# Patient Record
Sex: Female | Born: 1935 | Race: White | Hispanic: No | Marital: Married | Smoking: Never smoker
Health system: Southern US, Community
[De-identification: ages and names within clinical notes are randomized; demographics above are authoritative.]

## PROBLEM LIST (undated history)

## (undated) DIAGNOSIS — D649 Anemia, unspecified: Secondary | ICD-10-CM

## (undated) DIAGNOSIS — M810 Age-related osteoporosis without current pathological fracture: Secondary | ICD-10-CM

## (undated) DIAGNOSIS — I739 Peripheral vascular disease, unspecified: Principal | ICD-10-CM

## (undated) DIAGNOSIS — Z9289 Personal history of other medical treatment: Secondary | ICD-10-CM

## (undated) DIAGNOSIS — M1990 Unspecified osteoarthritis, unspecified site: Secondary | ICD-10-CM

## (undated) DIAGNOSIS — L97909 Non-pressure chronic ulcer of unspecified part of unspecified lower leg with unspecified severity: Secondary | ICD-10-CM

## (undated) DIAGNOSIS — S32049A Unspecified fracture of fourth lumbar vertebra, initial encounter for closed fracture: Secondary | ICD-10-CM

## (undated) DIAGNOSIS — E039 Hypothyroidism, unspecified: Secondary | ICD-10-CM

## (undated) DIAGNOSIS — I82409 Acute embolism and thrombosis of unspecified deep veins of unspecified lower extremity: Secondary | ICD-10-CM

## (undated) DIAGNOSIS — I50814 Right heart failure due to left heart failure: Secondary | ICD-10-CM

## (undated) DIAGNOSIS — I4891 Unspecified atrial fibrillation: Secondary | ICD-10-CM

## (undated) DIAGNOSIS — I10 Essential (primary) hypertension: Secondary | ICD-10-CM

## (undated) HISTORY — PX: OTHER SURGICAL HISTORY: SHX169

## (undated) HISTORY — PX: TONSILLECTOMY AND ADENOIDECTOMY: SUR1326

## (undated) HISTORY — DX: Non-pressure chronic ulcer of unspecified part of unspecified lower leg with unspecified severity: L97.909

## (undated) HISTORY — DX: Peripheral vascular disease, unspecified: I73.9

## (undated) HISTORY — DX: Age-related osteoporosis without current pathological fracture: M81.0

## (undated) HISTORY — DX: Unspecified atrial fibrillation: I48.91

## (undated) HISTORY — DX: Right heart failure due to left heart failure: I50.814

## (undated) HISTORY — DX: Essential (primary) hypertension: I10

---

## 1958-12-27 HISTORY — PX: DILATION AND CURETTAGE OF UTERUS: SHX78

## 2011-04-28 DIAGNOSIS — I4891 Unspecified atrial fibrillation: Secondary | ICD-10-CM

## 2011-04-28 DIAGNOSIS — Z9289 Personal history of other medical treatment: Secondary | ICD-10-CM

## 2011-04-28 DIAGNOSIS — I739 Peripheral vascular disease, unspecified: Secondary | ICD-10-CM

## 2011-04-28 DIAGNOSIS — I82409 Acute embolism and thrombosis of unspecified deep veins of unspecified lower extremity: Secondary | ICD-10-CM

## 2011-04-28 HISTORY — DX: Peripheral vascular disease, unspecified: I73.9

## 2011-04-28 HISTORY — DX: Acute embolism and thrombosis of unspecified deep veins of unspecified lower extremity: I82.409

## 2011-04-28 HISTORY — DX: Personal history of other medical treatment: Z92.89

## 2011-04-28 HISTORY — DX: Unspecified atrial fibrillation: I48.91

## 2011-04-28 HISTORY — PX: THROMBECTOMY: PRO61

## 2012-06-15 ENCOUNTER — Telehealth: Payer: Self-pay | Admitting: *Deleted

## 2012-06-15 ENCOUNTER — Ambulatory Visit: Payer: Self-pay | Admitting: Internal Medicine

## 2012-06-15 NOTE — Telephone Encounter (Signed)
Home health nurse called stating the pt has been admitted to home health care and she has a pressure ulcer on her left post interior heel. She believes pt needs wound care appt. She wanted Dr Debby Bud to be aware of this. Be advised.

## 2012-06-15 NOTE — Telephone Encounter (Signed)
Kristina Diaz has not established with me for care - will be happy to help once she is established as my patient

## 2012-06-21 ENCOUNTER — Ambulatory Visit (INDEPENDENT_AMBULATORY_CARE_PROVIDER_SITE_OTHER): Payer: Medicare Other | Admitting: Internal Medicine

## 2012-06-21 ENCOUNTER — Encounter: Payer: Self-pay | Admitting: Internal Medicine

## 2012-06-21 VITALS — BP 104/64 | HR 62 | Temp 97.1°F | Resp 10

## 2012-06-21 DIAGNOSIS — E079 Disorder of thyroid, unspecified: Secondary | ICD-10-CM

## 2012-06-21 DIAGNOSIS — I739 Peripheral vascular disease, unspecified: Secondary | ICD-10-CM

## 2012-06-21 DIAGNOSIS — I4891 Unspecified atrial fibrillation: Secondary | ICD-10-CM

## 2012-06-21 DIAGNOSIS — I10 Essential (primary) hypertension: Secondary | ICD-10-CM

## 2012-06-21 NOTE — Progress Notes (Signed)
Subjective:    Patient ID: Kristina Diaz, female    DOB: 1929/08/11, 79 y.o.   MRN: [id number]  HPI Kristina Diaz presents to establish for on-going medical care. She had been in good health until the last year. She developed marked LE edema. She has had stasis wounds that required treatment. 1 year ago she had the onset of swelling in the legs and shortness of breath. She was seen by cardiology and was diagnosed with atrial fibrillation. Plus or minus started on medication, was started on blood thinner.  She was also referred to a vascular surgeon for unclear reasons but most likely for PAD. She had arterial doppler which revealed poor circulation left LE. She was admitted for treatment of a "clot"  Blockage with thrombectomy. She went to rehab. She was readmitted for recurrent arterial blockage in the left leg which was successfully treated, PCI(?) but she developed a large wound left calve requiring plastic surgery consult but no surgical intervention. She was put to a wound vac and returned to rehab. She then developed decubitus ulcer left achilles/heel that required wound vac. She then developed a hip strain that required rehab again. She was discharged to home Feb 10th. She subsequently has moved to [place].  No outside records were available at today's visit for review leaving many questions about her diagnosis and treatment unanswered.  Past Medical History  Diagnosis Date  . Clotting disorder   . Heart disease   . Blood transfusion without reported diagnosis 2013    Had DVT and did require transfusion  . PAD (peripheral artery disease) 2013    left leg. sounds like she had angio-plasty   . Atrial fibrillation 2013    s/p [facility] that failed. ON medication  . Hypertension   . Thyroid disease 2013    hypothyroidism  . Osteoporosis    Past Surgical History  Procedure Laterality Date  . Tonsillectomy and adenoidectomy    . Thrombectomy  2013  . G2p2     Family History  Problem Relation  Age of Onset  . Arthritis Mother   . Cancer Mother     breast cancer   . Cancer Brother     lung cancer  . Tuberculosis Father   . Cancer Sister     stg 4 breast cancer  . Diabetes Neg Hx   . Heart disease Neg Hx   . COPD Neg Hx    History   Social History  . Marital Status: Married    Spouse Name: N/A    Number of Children: 2  . Years of Education: 13   Occupational History  . retired    Social History Main Topics  . Smoking status: Never Smoker   . Smokeless tobacco: Never Used  . Alcohol Use: 1.5 oz/week    3 drink(s) per week     Comment: wine  . Drug Use: No  . Sexually Active: Yes -- Female partner(s)   Other Topics Concern  . Not on file   Social History Narrative   HSG. 1 year of college. Married: 1959: 2 sons - '63, '70: 3 grandchildren. Work - Animal nutritionist. Have been living with husband in [place]. ACP/Living will - HCPOA both sons. Recommended going to [email or web address].                       Review of Systems Constitutional:  Negative for fever, chills, activity change and unexpected weight change. But she has had weight gain  from fluid accumulation. HEENT:  Negative for hearing loss, ear pain, congestion, neck stiffness and postnasal drip. Negative for sore throat or swallowing problems. Negative for dental complaints.   Eyes: Negative for vision loss or change in visual acuity.  Respiratory: Negative for chest tightness and wheezing. Negative for DOE.   Cardiovascular: Negative for chest pain or palpitations. No decreased exercise tolerance Gastrointestinal: No change in bowel habit. No bloating or gas. No reflux or indigestion Genitourinary: Negative for urgency, frequency, flank pain and difficulty urinating.  Musculoskeletal: Negative for myalgias, back pain, arthralgias and gait problem.  Neurological: Negative for dizziness, tremors, weakness and headaches.  Hematological: Negative for adenopathy.   Psychiatric/Behavioral: Negative for behavioral problems. POsitive for dysphoric mood.       Objective:   Physical Exam Filed Vitals:   06/21/12 1123  BP: 104/64  Pulse: 62  Temp: 97.1 F (36.2 C)  Resp: 10   Gen'l - obese white woman carry most of her weight in her legs and lower torso. She is in no medical distress HEENT - [place]/AT, C&S clear Pulm - normal respiration. Neuro - No focal finding on observation but she is in w/c and was not stood. Very poor recollection of medical events and poorly organized in her thinking Derm - LE with chronic stasis change: erythematous, very firm and waxy to touch. Healed wound left calve               Assessment & Plan:  60 minutes spent in obtaining history and limited exam. Patient will return in 1 week for more complete exam and at that time will have hopefully received hospital and medical records from [place].

## 2012-06-22 DIAGNOSIS — I10 Essential (primary) hypertension: Secondary | ICD-10-CM | POA: Insufficient documentation

## 2012-06-22 DIAGNOSIS — I4891 Unspecified atrial fibrillation: Secondary | ICD-10-CM | POA: Insufficient documentation

## 2012-06-22 DIAGNOSIS — E079 Disorder of thyroid, unspecified: Secondary | ICD-10-CM | POA: Insufficient documentation

## 2012-06-22 DIAGNOSIS — I739 Peripheral vascular disease, unspecified: Secondary | ICD-10-CM | POA: Insufficient documentation

## 2012-06-22 NOTE — Assessment & Plan Note (Signed)
Records requested. No acute vascular problems at today's visit

## 2012-06-22 NOTE — Assessment & Plan Note (Signed)
Hemodynamically stable. Meds reviewed.  Plan - continue present meds  Will obtain and review outside labs

## 2012-06-22 NOTE — Assessment & Plan Note (Signed)
On multiple medications. By her report she has no prior h/o HTN. BP today is OK  Plan Continue present medications

## 2012-06-22 NOTE — Assessment & Plan Note (Signed)
On replacement therapy.  Plan Continue present medications  Will need follow up lab.

## 2012-06-23 ENCOUNTER — Emergency Department (HOSPITAL_COMMUNITY)
Admission: EM | Admit: 2012-06-23 | Discharge: 2012-06-23 | Disposition: A | Discharge status: Home / Self Care | Payer: Medicare Other | Attending: Emergency Medicine | Admitting: Emergency Medicine

## 2012-06-23 ENCOUNTER — Encounter (HOSPITAL_COMMUNITY): Payer: Self-pay | Admitting: Emergency Medicine

## 2012-06-23 ENCOUNTER — Emergency Department (HOSPITAL_COMMUNITY): Payer: Medicare Other

## 2012-06-23 DIAGNOSIS — L02619 Cutaneous abscess of unspecified foot: Secondary | ICD-10-CM | POA: Insufficient documentation

## 2012-06-23 DIAGNOSIS — E039 Hypothyroidism, unspecified: Secondary | ICD-10-CM | POA: Insufficient documentation

## 2012-06-23 DIAGNOSIS — I8310 Varicose veins of unspecified lower extremity with inflammation: Secondary | ICD-10-CM | POA: Insufficient documentation

## 2012-06-23 DIAGNOSIS — Z79899 Other long term (current) drug therapy: Secondary | ICD-10-CM | POA: Insufficient documentation

## 2012-06-23 DIAGNOSIS — I10 Essential (primary) hypertension: Secondary | ICD-10-CM | POA: Insufficient documentation

## 2012-06-23 DIAGNOSIS — I4891 Unspecified atrial fibrillation: Secondary | ICD-10-CM | POA: Insufficient documentation

## 2012-06-23 DIAGNOSIS — Z8739 Personal history of other diseases of the musculoskeletal system and connective tissue: Secondary | ICD-10-CM | POA: Insufficient documentation

## 2012-06-23 DIAGNOSIS — Z8679 Personal history of other diseases of the circulatory system: Secondary | ICD-10-CM | POA: Insufficient documentation

## 2012-06-23 DIAGNOSIS — Z7901 Long term (current) use of anticoagulants: Secondary | ICD-10-CM | POA: Insufficient documentation

## 2012-06-23 DIAGNOSIS — Z86718 Personal history of other venous thrombosis and embolism: Secondary | ICD-10-CM | POA: Insufficient documentation

## 2012-06-23 HISTORY — DX: Acute embolism and thrombosis of unspecified deep veins of unspecified lower extremity: I82.409

## 2012-06-23 LAB — CBC WITH DIFFERENTIAL/PLATELET
HCT: 30.7 % — ABNORMAL LOW (ref 36.0–46.0)
Hemoglobin: 10.2 g/dL — ABNORMAL LOW (ref 12.0–15.0)
Lymphocytes Relative: 21 % (ref 12–46)
Lymphs Abs: 1.1 10*3/uL (ref 0.7–4.0)
Monocytes Absolute: 0.8 10*3/uL (ref 0.1–1.0)
Monocytes Relative: 15 % — ABNORMAL HIGH (ref 3–12)
Neutro Abs: 3.3 10*3/uL (ref 1.7–7.7)
WBC: 5.3 10*3/uL (ref 4.0–10.5)

## 2012-06-23 LAB — URINALYSIS, ROUTINE W REFLEX MICROSCOPIC
Bilirubin Urine: NEGATIVE
Ketones, ur: NEGATIVE mg/dL
Nitrite: NEGATIVE
pH: 6.5 (ref 5.0–8.0)

## 2012-06-23 LAB — COMPREHENSIVE METABOLIC PANEL
AST: 32 U/L (ref 0–37)
BUN: 40 mg/dL — ABNORMAL HIGH (ref 6–23)
CO2: 31 mEq/L (ref 19–32)
Chloride: 98 mEq/L (ref 96–112)
Creatinine, Ser: 1.09 mg/dL (ref 0.50–1.10)
GFR calc non Af Amer: 47 mL/min — ABNORMAL LOW (ref >90)
Glucose, Bld: 117 mg/dL — ABNORMAL HIGH (ref 70–99)
Total Bilirubin: 1 mg/dL (ref 0.3–1.2)

## 2012-06-23 LAB — URINE MICROSCOPIC-ADD ON

## 2012-06-23 LAB — PROTIME-INR
INR: 2.28 — ABNORMAL HIGH (ref 0.00–1.49)
Prothrombin Time: 24.1 seconds — ABNORMAL HIGH (ref 11.6–15.2)

## 2012-06-23 MED ORDER — CLINDAMYCIN HCL 300 MG PO CAPS
300.0000 mg | ORAL_CAPSULE | Freq: Once | ORAL | Status: AC
  Administered 2012-06-23: 300 mg via ORAL
  Filled 2012-06-23: qty 1

## 2012-06-23 MED ORDER — CLINDAMYCIN HCL 150 MG PO CAPS: 150.0000 mg | ORAL_CAPSULE | Freq: Four times a day (QID) | ORAL | Status: DC

## 2012-06-23 NOTE — ED Notes (Signed)
Just moved here from [place] and she has had redness and swelling on both lower legs has open wound left heal

## 2012-06-23 NOTE — ED Notes (Signed)
Pt had DVT removed last March.  Swelling in legs has continued since that time.  Pt with hx of compression fracture along spine and a torn ligament in right hip.  Pt from [facility].  Pt presents with redness/swelling to legs with blisters present.

## 2012-06-23 NOTE — ED Provider Notes (Addendum)
History     CSN: [id number]  Arrival date & time 06/23/12  1252   First MD Initiated Contact with Patient 06/23/12 1315      Chief Complaint  Patient presents with  . Leg Swelling    (Consider location/radiation/quality/duration/timing/severity/associated sxs/prior treatment) HPI Patient presents with daughters after recent move [place] state.  Patient has  Chronic venous stasis of bilateral lower extremities, history of dvt on anticoagulation.  Daughter does not know baseline of swelling but thinks they may be more red than previously.  There is a dressing on a wound of the left heel. Patient does not complain of increased pain, swelling, fever, trauma, dyspnea.   Past Medical History  Diagnosis Date  . Clotting disorder   . Heart disease   . Blood transfusion without reported diagnosis 2013    Had DVT and did require transfusion  . PAD (peripheral artery disease) 2013    left leg. sounds like she had angio-plasty   . Atrial fibrillation 2013    s/p [facility] that failed. ON medication  . Hypertension   . Thyroid disease 2013    hypothyroidism  . Osteoporosis   . DVT (deep venous thrombosis)     Past Surgical History  Procedure Laterality Date  . Tonsillectomy and adenoidectomy    . Thrombectomy  2013  . G2p2      Family History  Problem Relation Age of Onset  . Arthritis Mother   . Cancer Mother     breast cancer   . Cancer Brother     lung cancer  . Tuberculosis Father   . Cancer Sister     stg 4 breast cancer  . Diabetes Neg Hx   . Heart disease Neg Hx   . COPD Neg Hx     History  Substance Use Topics  . Smoking status: Never Smoker   . Smokeless tobacco: Never Used  . Alcohol Use: 1.5 oz/week    3 drink(s) per week     Comment: wine    OB History   Grav Para Term Preterm Abortions TAB SAB Ect Mult Living                  Review of Systems  All other systems reviewed and are negative.    Allergies  Review of patient's allergies  indicates no known allergies.  Home Medications   Current Outpatient Rx  Name  Route  Sig  Dispense  Refill  . acetaminophen (TYLENOL) 325 MG tablet   Oral   Take 650 mg by mouth daily as needed for pain.          Marland Kitchen amiodarone (PACERONE) 200 MG tablet   Oral   Take 200 mg by mouth every morning.          . clotrimazole (LOTRIMIN) 1 % cream   Topical   Apply 1 application topically 2 (two) times daily. Apply for 14 days.  Order date 06/10/2012.         . digoxin (LANOXIN) 0.125 MG tablet   Oral   Take 0.125 mg by mouth every morning.          . docusate sodium (COLACE) 100 MG capsule   Oral   Take 100 mg by mouth 2 (two) times daily.          . furosemide (LASIX) 40 MG tablet   Oral   Take 40 mg by mouth every morning.          Marland Kitchen  levothyroxine (SYNTHROID, LEVOTHROID) 25 MCG tablet   Oral   Take 25 mcg by mouth daily.         . metoprolol succinate (TOPROL-XL) 25 MG 24 hr tablet   Oral   Take 25 mg by mouth daily. Hold for SBP <110 and HR <60 and call MD.         . Multiple Vitamin (MULTIVITAMIN WITH MINERALS) TABS   Oral   Take 1 tablet by mouth every morning.         . nutrition supplement (JUVEN) PACK   Oral   Take 1 packet by mouth 3 (three) times daily.         Marland Kitchen oxyCODONE (OXY IR/ROXICODONE) 5 MG immediate release tablet   Oral   Take 5 mg by mouth 4 (four) times daily as needed for pain.         Marland Kitchen oxyCODONE (OXYCONTIN) 10 MG 12 hr tablet   Oral   Take 10 mg by mouth 2 (two) times daily.          . potassium chloride (K-DUR,KLOR-CON) 10 MEQ tablet   Oral   Take 10 mEq by mouth every morning.          . senna (SENOKOT) 8.6 MG TABS   Oral   Take 2 tablets by mouth every morning.         . warfarin (COUMADIN) 3 MG tablet   Oral   Take 3 mg by mouth every evening.           BP 128/55  Pulse 74  Temp(Src) 98 F (36.7 C) (Oral)  Resp 18  SpO2 96%  Physical Exam  Nursing note and vitals reviewed. Constitutional:  She appears well-developed and well-nourished.  HENT:  Head: Normocephalic and atraumatic.  Eyes: Conjunctivae and EOM are normal. Pupils are equal, round, and reactive to light.  Neck: Normal range of motion. Neck supple.  Cardiovascular: Normal rate.   Pulmonary/Chest: Effort normal.  Abdominal: Soft.  Musculoskeletal:  Bilateral lower extremity swelling edema with some erythema.  Left heal with pressure ulcer.    Neurological: She is alert.  Skin: Skin is warm and dry.    ED Course  Procedures (including critical care time)  Labs Reviewed  CBC WITH DIFFERENTIAL  COMPREHENSIVE METABOLIC PANEL  PROTIME-INR  URINALYSIS, ROUTINE W REFLEX MICROSCOPIC   Dg Chest Port 1 View  06/23/2012  *RADIOLOGY REPORT*  Clinical Data: CHF.  Swelling.  Hypertension.  PORTABLE CHEST - 1 VIEW  Comparison: None.  Findings: Midline trachea.  Mild cardiomegaly.  No right and no definite left pleural fluid.  No pneumothorax.  Right lung is clear.  Suboptimal evaluation of the inferior left hemithorax due to overlying soft tissues.  IMPRESSION: Cardiomegaly without congestive failure.  Suboptimal evaluation of the left pleural space and lung base due to overlying soft tissues.  Left-sided pleural fluid and/or left base air space disease cannot be excluded.   Original Report Authenticated By: Jeronimo Greaves, M.D.      No diagnosis found.   Date: 06/23/2012  Rate: 64  Rhythm: atrial fibrillation  QRS Axis: left  Intervals: a fib  ST/T Wave abnormalities: prwp, nsst   Conduction Disutrbances:none  Narrative Interpretation:   Old EKG Reviewed: none available     Patient with chronic venous stasis with superimposed cellulitis. She is not febrile and does not have an elevated white blood cell count. She'll be started on clindamycin. She and family are advised to have recheck tomorrow. They  are advised regarding signs and symptoms to return to emergency pertinent for such as fever, increasing redness,  inability to keep medications down.       Hilario Quarry, MD 06/23/12 1702  Hilario Quarry, MD 07/16/12 [phone]

## 2012-06-25 LAB — URINE CULTURE

## 2012-06-27 ENCOUNTER — Telehealth: Payer: Self-pay | Admitting: *Deleted

## 2012-06-27 NOTE — Telephone Encounter (Signed)
Kristina Diaz with [facility] needs an order for wound care for her Left Heel area, needs serious debriding.  Order for imodium as well, clindamycin causing diarrhea.   Also, Crystal, Wellness Nurse with [facility] called needing order for PT, OT and nursing.  They are wanting to discontinue with [facility] and use a new agency.  Pt is refusing to take senokot and requesting colace, to take 1 time a day instead of 2 times daily.  Imodium order as well.

## 2012-06-27 NOTE — Telephone Encounter (Signed)
Patient has follow up appointment Weds March 5th at [facility] - will assess wound then and if needing debridement will need to set her up with the wound center.  Need to have an agency identified if new orders for PT/OT/RN are needed/

## 2012-06-27 NOTE — Telephone Encounter (Signed)
Please read note below

## 2012-06-29 ENCOUNTER — Ambulatory Visit: Payer: Medicare Other | Admitting: Internal Medicine

## 2012-06-29 ENCOUNTER — Encounter: Payer: Self-pay | Admitting: Internal Medicine

## 2012-06-29 ENCOUNTER — Ambulatory Visit (INDEPENDENT_AMBULATORY_CARE_PROVIDER_SITE_OTHER): Payer: Medicare Other | Admitting: Internal Medicine

## 2012-06-29 VITALS — BP 128/72 | HR 55 | Temp 97.5°F | Resp 12 | Wt 195.0 lb

## 2012-06-29 DIAGNOSIS — I739 Peripheral vascular disease, unspecified: Secondary | ICD-10-CM

## 2012-06-29 DIAGNOSIS — L97409 Non-pressure chronic ulcer of unspecified heel and midfoot with unspecified severity: Secondary | ICD-10-CM

## 2012-06-29 DIAGNOSIS — L98499 Non-pressure chronic ulcer of skin of other sites with unspecified severity: Secondary | ICD-10-CM

## 2012-06-29 DIAGNOSIS — I4891 Unspecified atrial fibrillation: Secondary | ICD-10-CM

## 2012-06-29 DIAGNOSIS — I10 Essential (primary) hypertension: Secondary | ICD-10-CM

## 2012-06-29 DIAGNOSIS — E079 Disorder of thyroid, unspecified: Secondary | ICD-10-CM

## 2012-06-29 NOTE — Patient Instructions (Addendum)
1. Cellulitis - no physical evidence of infection/cellulitis left leg. There are chronic skin changes. In the ED your labs were normal except for a mild anemia. Plan- ok to stop the clindamycin  2. Wounds - the wound over the achilles looks like robust granulation tissue of a healing or healed wound. At the left heel there is a mild scabbing but no open ulcer. Plan Referral to the Wound center for assessment and treatment plan  Continue with a dry dressing changed daily, or every other day if not soaked.  3. Cardiac - seem stable and will refer you to cardiology to establish for care. Continue all you medications.  4. Thyroid - no lab but will check once old records are reviewed.  5. Chronic pain management - no changes now but will work towards getting you off narcotics.   6. Return office visit in approximately one month.

## 2012-06-30 ENCOUNTER — Telehealth: Payer: Self-pay | Admitting: *Deleted

## 2012-06-30 DIAGNOSIS — I70209 Unspecified atherosclerosis of native arteries of extremities, unspecified extremity: Secondary | ICD-10-CM | POA: Insufficient documentation

## 2012-06-30 NOTE — Assessment & Plan Note (Signed)
stable controlled rate. Reviewed her meds with her daughter in law present.  Plan Continue present medication  Referral to establish with cardiology

## 2012-06-30 NOTE — Telephone Encounter (Signed)
Crystal with [facility] called stating that she asked the family which home health agency they would like to change to. Family is requesting an order for PT, OT, and nursing care from [place]. Please advise.

## 2012-06-30 NOTE — Assessment & Plan Note (Signed)
BP Readings from Last 3 Encounters:  06/29/12 128/72  06/23/12 128/55  06/21/12 104/64   Good control on present medications

## 2012-06-30 NOTE — Progress Notes (Signed)
Subjective:    Patient ID: Kristina Diaz, female    DOB: [birth year], 79 y.o.   MRN: [id number]  HPI Kristina Diaz present for new patient follow up. Since her initial visit no records have been received. She has no additional information to give other than what was previously documented.  In the interval nursing at [facility] referred her to the ED for possible infection of the legs. The ED record is reviewed: labs were normal, patient had no fever. The attending noted erythema of the distal left leg and diagnosed mild cellulitis. Clindamycin was prescribed which the patient started on the 27th. Her course was complicated by diarrhea associated with the medication so she has stopped taking the clindamycin. She has had no fever or chills. The wound has not changed on her left leg.  PMH, FamHx and SocHx reviewed for any changes and relevance.  Current Outpatient Prescriptions on File Prior to Visit  Medication Sig Dispense Refill  . acetaminophen (TYLENOL) 325 MG tablet Take 650 mg by mouth daily as needed for pain.       Marland Kitchen amiodarone (PACERONE) 200 MG tablet Take 200 mg by mouth every morning.       . clindamycin (CLEOCIN) 150 MG capsule Take 1 capsule (150 mg total) by mouth every 6 (six) hours.  28 capsule  0  . clotrimazole (LOTRIMIN) 1 % cream Apply 1 application topically 2 (two) times daily. Apply for 14 days.  Order date 06/10/2012.      . digoxin (LANOXIN) 0.125 MG tablet Take 0.125 mg by mouth every morning.       . docusate sodium (COLACE) 100 MG capsule Take 100 mg by mouth 2 (two) times daily.       . furosemide (LASIX) 40 MG tablet Take 40 mg by mouth every morning.       Marland Kitchen levothyroxine (SYNTHROID, LEVOTHROID) 25 MCG tablet Take 25 mcg by mouth daily.      . metoprolol succinate (TOPROL-XL) 25 MG 24 hr tablet Take 25 mg by mouth daily. Hold for SBP <110 and HR <60 and call MD.      . Multiple Vitamin (MULTIVITAMIN WITH MINERALS) TABS Take 1 tablet by mouth every morning.      .  nutrition supplement (JUVEN) PACK Take 1 packet by mouth 3 (three) times daily.      Marland Kitchen oxyCODONE (OXY IR/ROXICODONE) 5 MG immediate release tablet Take 5 mg by mouth 4 (four) times daily as needed for pain.      Marland Kitchen oxyCODONE (OXYCONTIN) 10 MG 12 hr tablet Take 10 mg by mouth 2 (two) times daily.       . potassium chloride (K-DUR,KLOR-CON) 10 MEQ tablet Take 10 mEq by mouth every morning.       . senna (SENOKOT) 8.6 MG TABS Take 2 tablets by mouth every morning.      . warfarin (COUMADIN) 3 MG tablet Take 3 mg by mouth every evening.       No current facility-administered medications on file prior to visit.      Review of Systems System review is negative for any constitutional, cardiac, pulmonary, GI or neuro symptoms or complaints other than as described in the HPI.     Objective:   Physical Exam Filed Vitals:   06/29/12 1354  BP: 128/72  Pulse: 55  Temp: 97.5 F (36.4 C)  Resp: 12   Gen'l - an obese white woman in a w/c in no distress HEENT- C&S clear Cor- 2+ radial,  heart sounds are distant but IRIR and rate controlled Chest - pectus excavatum is noted. PUlm - normal respirations, Lungs CTAP Ext - both legs are edematous. The distal aspect of both legs with chronic stasis changes with thickening of the skin and a wood nature, mildly erythematous. Distal leg left with a 6 cm raised lesion that appears granular and red in appearance with clear drainage; small scabbing noted over the heel. A large indentation at a previous wound site proximal left calve is noted. Neuro - A&O, easily frustrated with poor recall of the details of her medical history.       Assessment & Plan:

## 2012-06-30 NOTE — Assessment & Plan Note (Signed)
No recent labs, awaiting records from [facility].

## 2012-07-05 ENCOUNTER — Telehealth: Payer: Self-pay

## 2012-07-05 NOTE — Telephone Encounter (Signed)
Crystal with [facility] called [phone]. She could not feel a fetal pulse in pt's left leg. She is requesting a doppler u/s of the left leg. Her leg is warm and red. Please advise.

## 2012-07-05 NOTE — Telephone Encounter (Signed)
No u/s!Marland Kitchen Patient has chronic edema, erythema. It is very difficult to feel a pulse but on March 5th the foot is warm with normal capillary refill.

## 2012-07-06 NOTE — Telephone Encounter (Signed)
Phone call to [employer] with [facility] [phone]. I let her know per Dr Debby Bud he does not want pt to have an u/s. I stated to her patient has chronic edema erythema and it's very difficult to feel a pulse but on March 5 her foot was warm with normal capillary refill. She stated she will let the family know.

## 2012-07-07 ENCOUNTER — Telehealth: Payer: Self-pay

## 2012-07-07 DIAGNOSIS — I83892 Varicose veins of left lower extremities with other complications: Secondary | ICD-10-CM

## 2012-07-07 NOTE — Telephone Encounter (Signed)
Velna Hatchet with [facility] [phone] called. She would like an order for an air cushion while patient is sitting and also an order for Nystatin due to yeast inside her sacral area. She also states her left thigh has increased in size. Please advise.

## 2012-07-08 ENCOUNTER — Ambulatory Visit (HOSPITAL_COMMUNITY)
Admission: RE | Admit: 2012-07-08 | Discharge: 2012-07-08 | Disposition: A | Discharge status: Home / Self Care | Payer: Medicare Other | Source: Ambulatory Visit | Attending: Internal Medicine | Admitting: Internal Medicine

## 2012-07-08 DIAGNOSIS — I83892 Varicose veins of left lower extremities with other complications: Secondary | ICD-10-CM

## 2012-07-08 DIAGNOSIS — R609 Edema, unspecified: Secondary | ICD-10-CM | POA: Insufficient documentation

## 2012-07-08 DIAGNOSIS — M79609 Pain in unspecified limb: Secondary | ICD-10-CM

## 2012-07-08 DIAGNOSIS — M7989 Other specified soft tissue disorders: Secondary | ICD-10-CM

## 2012-07-08 MED ORDER — NYSTATIN 100000 UNIT/GM EX POWD: Freq: Four times a day (QID) | CUTANEOUS | Status: DC

## 2012-07-08 NOTE — Telephone Encounter (Signed)
Dr Debby Bud faxed the Nystatin and order for air cushion

## 2012-07-08 NOTE — Progress Notes (Signed)
VASCULAR LAB PRELIMINARY  PRELIMINARY  PRELIMINARY  PRELIMINARY  Left lower extremity venous duplex completed.    Preliminary report:  Inconclusive exam due to technical limitations Extreme swelling and thickness of the skin. Unable to visualize the majority of vein segments.  SLAUGHTER, [place], RVS 07/08/2012, 5:16 PM

## 2012-07-11 ENCOUNTER — Telehealth: Payer: Self-pay

## 2012-07-11 DIAGNOSIS — L89899 Pressure ulcer of other site, unspecified stage: Secondary | ICD-10-CM

## 2012-07-11 DIAGNOSIS — L89609 Pressure ulcer of unspecified heel, unspecified stage: Secondary | ICD-10-CM

## 2012-07-11 DIAGNOSIS — L8995 Pressure ulcer of unspecified site, unstageable: Secondary | ICD-10-CM

## 2012-07-11 DIAGNOSIS — I739 Peripheral vascular disease, unspecified: Secondary | ICD-10-CM

## 2012-07-11 NOTE — Telephone Encounter (Signed)
HHRN called stating she noticed several significant medication interaction with this patient; Warfarin and Amiodarone - increases effect of warfarin. Digoxin and Amiodarone - increase effects of Digoxin. Amiodarone and Metoprolol - severe hypotension, bradycardia and possibilities of cardiac arrest.  HHRN is requesting PCP advisement if he feels requires attention prior to Cardiology appt 04/01. HHRN is also requesting PCP advise if he would like PT INR done as well or if this should also be managed by Cardiology.

## 2012-07-11 NOTE — Telephone Encounter (Signed)
Appreciate notification of potential interactions. This has been a stable regimen and will continue this for now. Please call if the patient is having low blood pressure readings.  No need for cardiology prior to April appt if she is hemodynamically stable.  Protimes should be checked at [facility] - please have appointment set up with Lodema Pilot, if not already done.

## 2012-07-12 ENCOUNTER — Encounter (HOSPITAL_BASED_OUTPATIENT_CLINIC_OR_DEPARTMENT_OTHER): Payer: Medicare Other | Attending: General Surgery

## 2012-07-12 ENCOUNTER — Telehealth: Payer: Self-pay | Admitting: General Practice

## 2012-07-12 DIAGNOSIS — L84 Corns and callosities: Secondary | ICD-10-CM | POA: Insufficient documentation

## 2012-07-12 DIAGNOSIS — I4891 Unspecified atrial fibrillation: Secondary | ICD-10-CM | POA: Insufficient documentation

## 2012-07-12 DIAGNOSIS — L8990 Pressure ulcer of unspecified site, unspecified stage: Secondary | ICD-10-CM | POA: Insufficient documentation

## 2012-07-12 DIAGNOSIS — M7989 Other specified soft tissue disorders: Secondary | ICD-10-CM | POA: Insufficient documentation

## 2012-07-12 DIAGNOSIS — Z86718 Personal history of other venous thrombosis and embolism: Secondary | ICD-10-CM | POA: Insufficient documentation

## 2012-07-12 DIAGNOSIS — I10 Essential (primary) hypertension: Secondary | ICD-10-CM | POA: Insufficient documentation

## 2012-07-12 DIAGNOSIS — L89609 Pressure ulcer of unspecified heel, unspecified stage: Secondary | ICD-10-CM | POA: Insufficient documentation

## 2012-07-12 DIAGNOSIS — L89509 Pressure ulcer of unspecified ankle, unspecified stage: Secondary | ICD-10-CM | POA: Insufficient documentation

## 2012-07-12 NOTE — Telephone Encounter (Signed)
HHRN advised in detail of MD's recommendations via secure VM.   Arline Asp, can you please contact pt to schedule protimes per MEN?

## 2012-07-12 NOTE — Telephone Encounter (Signed)
LMOM.  Asked patient to call coumadin clinic to schedule appointment to check INR.

## 2012-07-13 ENCOUNTER — Telehealth: Payer: Self-pay | Admitting: Internal Medicine

## 2012-07-13 NOTE — Telephone Encounter (Signed)
rec'd from [facility] forward 33 & 37 pages to Dr.Norins 07/13/12

## 2012-07-14 ENCOUNTER — Other Ambulatory Visit: Payer: Self-pay

## 2012-07-14 MED ORDER — LEVOTHYROXINE SODIUM 25 MCG PO TABS: ORAL_TABLET | ORAL | Status: DC

## 2012-07-15 ENCOUNTER — Telehealth: Payer: Self-pay | Admitting: Internal Medicine

## 2012-07-15 NOTE — Telephone Encounter (Signed)
Stop digoxin. No other changes in regimen

## 2012-07-15 NOTE — Telephone Encounter (Addendum)
Patient Information:  Caller Name: Marylene Land  Phone: [phone]  Patient: Kristina Diaz  Gender: Female  DOB: 1929/08/30  Age: 79 Years  PCP: Illene Regulus (Adults only)  Office Follow Up:  Does the office need to follow up with this patient?: Yes  Instructions For The Office: Please discuss with PCP and advice as pt has no Cardiologist yet.  RN Note:  Reiterated not giving Digoxin if apical pulse is < than 60 beats per min. Please discuss with PCP and advice as pt has no Cardiologist yet. Please fax any new orders to [facility], [phone] attn: Crystal  Symptoms  Reason For Call & Symptoms: Marylene Land, RN, home heath RN, calling to report HR of 48. Pt completely asymptomatic and eating lunch now; no longer with. BP 120/62. Does have some increased swelling to lower extremeties.  Pt does take Digoxin, 0.125 mg Q am. MAR states to hold if apical pulse is <60 per/min. Lives in [place];  recently moved here from [place]. Does not have a cardiologist in town yet.  Reviewed Health History In EMR: Yes  Reviewed Medications In EMR: Yes  Reviewed Allergies In EMR: Yes  Reviewed Surgeries / Procedures: Yes  Date of Onset of Symptoms: 07/15/2012  Guideline(s) Used:  No Protocol Available - Information Only  Disposition Per Guideline:   Discuss with PCP and Callback by Nurse within 1 Hour  Reason For Disposition Reached:   Nursing judgment

## 2012-07-15 NOTE — Telephone Encounter (Signed)
Marylene Land RN informed and phone note faxed to [facility].

## 2012-07-15 NOTE — Telephone Encounter (Signed)
rec'd from Dr.Miquel Herrdia forward 6 pages to Dr.Norins

## 2012-07-19 ENCOUNTER — Telehealth: Payer: Self-pay | Admitting: Internal Medicine

## 2012-07-19 ENCOUNTER — Telehealth: Payer: Self-pay

## 2012-07-19 NOTE — Telephone Encounter (Signed)
Rec'd from Dr.Miguel Heredig forward 43 pages to Dr. Debby Bud

## 2012-07-19 NOTE — Telephone Encounter (Signed)
Ok for additional PT

## 2012-07-19 NOTE — Telephone Encounter (Signed)
[facility] called requesting additional orders for strengthening , gait and transfer training 2/week x 7 weeks.

## 2012-07-20 ENCOUNTER — Other Ambulatory Visit: Payer: Self-pay | Admitting: *Deleted

## 2012-07-20 MED ORDER — POTASSIUM CHLORIDE CRYS ER 10 MEQ PO TBCR: 10.0000 meq | EXTENDED_RELEASE_TABLET | Freq: Every morning | ORAL | Status: DC

## 2012-07-20 NOTE — H&P (Signed)
Kristina Diaz, Kristina Diaz                 ACCOUNT NO.:  [id number]  MEDICAL RECORD NO.:  [id number]  LOCATION:  FOOT                         FACILITY:  [facility]  PHYSICIAN:  Joanne Gavel, M.D.        DATE OF BIRTH:  Nov 15, 1929  DATE OF ADMISSION:  07/12/2012 DATE OF DISCHARGE:                             HISTORY & PHYSICAL   CHIEF COMPLAINT:  Wounds, right leg.  HISTORY OF PRESENT ILLNESS:  This is an 79 year old female with a complicated history.  We do not have her records from [place] where several procedures were performed.  It seems that she developed a thrombus or an embolism.  She does have chronic atrial fibrillation. This was around in March of 2013.  She had several diagnostic and operative procedures.  She had an embolism with thrombus removed near the popliteal fossa, and she was told that she had a thrombosis of an artery at that point later on.  During this time, she developed what I take to be decubitus wounds proximal to the ankle posteriorly and near the left heal.  All of this seemed to occur at the same time.  PAST MEDICAL HISTORY:  Significant for atrial fibrillation, hypertension, and thyroid disease.  PAST SURGICAL HISTORY:  Includes thrombectomy and at least one other operation on the leg, and a right hip repair.  Review of systems reveals at about the same time, severe back disease, which cannot be treated until the leg is healed.  SOCIAL HISTORY:  Cigarettes, none.  Alcohol, occasionally.  MEDICATIONS:  Pacerone  Lanoxin, Colace, Lasix, Synthroid, Toprol, OxyContin, oxycodone, K-Dur, Senokot, and Coumadin.  ALLERGIES:  No known allergies.  REVIEW OF SYSTEMS:  Essentially as above.  PHYSICAL EXAMINATION:  VITAL SIGNS:  Temperature 97.8, pulse 74 and irregular, respirations 18, blood pressure 138/71. GENERAL APPEARANCE:  Somewhat obese, awake alert, in some distress because of back discomfort. CHEST:  Clear. HEART:  Slightly irregular.  I hear no  murmurs. ABDOMEN:  Not examined. EXTREMITIES:  Examination of the left lower extremities reveals no peripheral pulses that I can palpate.  ABI was not measurable.  Proximal to the ankle, there is a 5.2 x 4, rather superficial lesion with a very dull base.  At the left heel, there is a 2.3 x 2.5 lesion, also very dull at the base.  Callus around both wounds and dry skin is excised.  IMPRESSION:  I believe these are decubitus wounds although there may be an element of arterial disease.  The calf is quite swollen and red, but a venous study done recently by Dr. Durwin Nora, although not technically perfect, does not reveal any thrombus.  PLAN:  We will start with Santyl and Hydrogel every other day.  I am anxiously awaiting whatever information I can get from [place] and arterial studies.     Joanne Gavel, M.D.     RA/MEDQ  D:  07/19/2012  T:  07/19/2012  Job:  [id number]

## 2012-07-20 NOTE — Telephone Encounter (Signed)
I spoke to [place] and advised her per Dr Debby Bud okay for additional PT

## 2012-07-25 ENCOUNTER — Encounter: Payer: Self-pay | Admitting: Cardiology

## 2012-07-25 ENCOUNTER — Other Ambulatory Visit (HOSPITAL_COMMUNITY): Payer: Self-pay | Admitting: General Surgery

## 2012-07-25 ENCOUNTER — Telehealth: Payer: Self-pay | Admitting: Internal Medicine

## 2012-07-25 DIAGNOSIS — I739 Peripheral vascular disease, unspecified: Secondary | ICD-10-CM

## 2012-07-25 DIAGNOSIS — T07XXXA Unspecified multiple injuries, initial encounter: Secondary | ICD-10-CM

## 2012-07-25 NOTE — Telephone Encounter (Signed)
Rec'd from Dr.Donald Jearl Klinefelter forward 28 pages to Dr.Norins 07/25/12

## 2012-07-26 ENCOUNTER — Encounter (HOSPITAL_BASED_OUTPATIENT_CLINIC_OR_DEPARTMENT_OTHER): Payer: Medicare Other | Attending: General Surgery

## 2012-07-26 ENCOUNTER — Ambulatory Visit (INDEPENDENT_AMBULATORY_CARE_PROVIDER_SITE_OTHER): Payer: Medicare Other | Admitting: Cardiology

## 2012-07-26 ENCOUNTER — Encounter: Payer: Self-pay | Admitting: Cardiology

## 2012-07-26 VITALS — BP 130/85 | HR 51 | Ht 66.0 in | Wt 190.0 lb

## 2012-07-26 DIAGNOSIS — L98499 Non-pressure chronic ulcer of skin of other sites with unspecified severity: Secondary | ICD-10-CM

## 2012-07-26 DIAGNOSIS — R0602 Shortness of breath: Secondary | ICD-10-CM

## 2012-07-26 DIAGNOSIS — L97409 Non-pressure chronic ulcer of unspecified heel and midfoot with unspecified severity: Secondary | ICD-10-CM | POA: Insufficient documentation

## 2012-07-26 DIAGNOSIS — R609 Edema, unspecified: Secondary | ICD-10-CM

## 2012-07-26 DIAGNOSIS — E079 Disorder of thyroid, unspecified: Secondary | ICD-10-CM

## 2012-07-26 DIAGNOSIS — E1169 Type 2 diabetes mellitus with other specified complication: Secondary | ICD-10-CM | POA: Insufficient documentation

## 2012-07-26 DIAGNOSIS — I739 Peripheral vascular disease, unspecified: Secondary | ICD-10-CM

## 2012-07-26 DIAGNOSIS — I70209 Unspecified atherosclerosis of native arteries of extremities, unspecified extremity: Secondary | ICD-10-CM

## 2012-07-26 DIAGNOSIS — L97809 Non-pressure chronic ulcer of other part of unspecified lower leg with unspecified severity: Secondary | ICD-10-CM | POA: Insufficient documentation

## 2012-07-26 DIAGNOSIS — I4891 Unspecified atrial fibrillation: Secondary | ICD-10-CM

## 2012-07-26 DIAGNOSIS — I10 Essential (primary) hypertension: Secondary | ICD-10-CM

## 2012-07-26 LAB — BASIC METABOLIC PANEL
BUN: 22 mg/dL (ref 6–23)
CO2: 29 mEq/L (ref 19–32)
Chloride: 98 mEq/L (ref 96–112)
Glucose, Bld: 84 mg/dL (ref 70–99)
Potassium: 3.9 mEq/L (ref 3.5–5.1)

## 2012-07-26 LAB — BRAIN NATRIURETIC PEPTIDE: Pro B Natriuretic peptide (BNP): 641 pg/mL — ABNORMAL HIGH (ref 0.0–100.0)

## 2012-07-26 MED ORDER — FUROSEMIDE 40 MG PO TABS: 40.0000 mg | ORAL_TABLET | Freq: Two times a day (BID) | ORAL | Status: DC

## 2012-07-26 NOTE — Assessment & Plan Note (Signed)
Patient has 4+ bilateral lower extremity edema. I do not have any records concerning her LV RV function or previous cardiac history. Her atrial fibrillation may be contributing. She apparently had unsuccessful attempt at cardioversion in the past. We discussed the options today. She declined admission today. I will obtain her previous records from [place]. Increase Lasix to 40 mg by mouth twice a day. Check potassium, renal function and BNP today. Repeat potassium and renal function in one week. She is scheduled to see the wound clinic for further management of her lower extremities as well. Repeat echocardiogram. I will see her back in one week. If her edema is not improving I would like for her to be admitted at that time for IV diuretic if she is agreeable. We will consider repeating cardioversion in the future pending review of outside records.

## 2012-07-26 NOTE — Progress Notes (Signed)
HPI: 79 year old female for evaluation of atrial fibrillation. Labs in Feb 2014 showed a hemoglobin of 10.2 with normal MCV. BUN and creatinine 40 and 1.09. TSH in February of 2014 was elevated at 15. Patient recently moved here from [place]. I do not have her previous records available. Approximately one year ago she apparently began to develop bilateral lower extremity edema. She was found to be in atrial fibrillation. She also apparently had either a DVT or an arterial thrombus requiring hospitalization and surgery. At some point there was an attempt at TEE guided cardioversion which apparently was unsuccessful. She went to rehabilitation and apparently her edema had improved somewhat. She moved here in February and since then she has had progressive lower extremity edema. She denies dyspnea, chest pain, palpitations, syncope or bleeding.  Current Outpatient Prescriptions  Medication Sig Dispense Refill  . acetaminophen (TYLENOL) 325 MG tablet Take 650 mg by mouth daily as needed for pain.       Marland Kitchen amiodarone (PACERONE) 200 MG tablet Take 200 mg by mouth every morning.       . clotrimazole (LOTRIMIN) 1 % cream Apply 1 application topically 2 (two) times daily. Apply for 14 days.  Order date 06/10/2012.      . furosemide (LASIX) 40 MG tablet Take 40 mg by mouth every morning.       Marland Kitchen levothyroxine (SYNTHROID, LEVOTHROID) 25 MCG tablet TAKE 1 AND 1/2 TABS (37.5MG ) BY MOUTH EVERY DAY*CHECK PULSE WEEKLY*  45 tablet  11  . metoprolol succinate (TOPROL-XL) 25 MG 24 hr tablet Take 25 mg by mouth daily. Hold for SBP <110 and HR <60 and call MD.      . Multiple Vitamin (MULTIVITAMIN WITH MINERALS) TABS Take 1 tablet by mouth every morning.      . nutrition supplement (JUVEN) PACK Take 1 packet by mouth 3 (three) times daily.      Marland Kitchen nystatin (MYCOSTATIN) powder Apply topically 4 (four) times daily.  15 g  11  . oxyCODONE (OXY IR/ROXICODONE) 5 MG immediate release tablet Take 5 mg by mouth 4 (four) times  daily as needed for pain.      Marland Kitchen oxyCODONE (OXYCONTIN) 10 MG 12 hr tablet Take 10 mg by mouth 2 (two) times daily.       . potassium chloride (K-DUR,KLOR-CON) 10 MEQ tablet Take 1 tablet (10 mEq total) by mouth every morning.  90 tablet  1  . warfarin (COUMADIN) 3 MG tablet Take 3 mg by mouth every evening.       No current facility-administered medications for this visit.    No Known Allergies  Past Medical History  Diagnosis Date  . Blood transfusion without reported diagnosis 2013    Had DVT and did require transfusion  . PAD (peripheral artery disease) 2013    left leg. sounds like she had angio-plasty   . Atrial fibrillation 2013    s/p [facility] that failed. ON medication  . Hypertension   . Thyroid disease 2013    hypothyroidism  . Osteoporosis   . DVT (deep venous thrombosis)     Past Surgical History  Procedure Laterality Date  . Tonsillectomy and adenoidectomy    . Thrombectomy  2013  . G2p2    . Tonsillectomy      History   Social History  . Marital Status: Married    Spouse Name: N/A    Number of Children: 2  . Years of Education: 13   Occupational History  . retired  Social History Main Topics  . Smoking status: Never Smoker   . Smokeless tobacco: Never Used  . Alcohol Use: 1.5 oz/week    3 drink(s) per week     Comment: wine  . Drug Use: No  . Sexually Active: Yes -- Female partner(s)   Other Topics Concern  . Not on file   Social History Narrative   HS[employer]. 1 year of college. Married: 1959: 2 sons - '63, '70: 3 grandchildren. Work - Animal nutritionist. Have been living with husband in [place]. ACP/Living will - HCPOA both sons. Recommended going to [email or web address].                    Family History  Problem Relation Age of Onset  . Arthritis Mother   . Cancer Mother     breast cancer   . Cancer Brother     lung cancer  . Tuberculosis Father   . Cancer Sister     stg 4 breast cancer  . Diabetes Neg Hx   . Heart disease  Neg Hx   . COPD Neg Hx     ROS: no fevers or chills, productive cough, hemoptysis, dysphasia, odynophagia, melena, hematochezia, dysuria, hematuria, rash, seizure activity, orthopnea, PND, claudication. Remaining systems are negative.  Physical Exam:   Blood pressure 130/85, pulse 51, height 5\' 6"  (1.676 m), weight 190 lb (86.183 kg).  General:  Well developed/well nourished in NAD Skin warm/dry Patient not depressed No peripheral clubbing Back-normal HEENT-normal/normal eyelids Neck supple/normal carotid upstroke bilaterally; no bruits; no JVD; no thyromegaly chest - CTA/ normal expansion CV - bradycardic and irregular/normal S1 and S2; no murmurs, rubs or gallops;  PMI nondisplaced Abdomen -NT/ND, no HSM, no mass, + bowel sounds, no bruit 2+ femoral pulses, no bruits Ext-4+ edema. Healing ulcer left shin. Neuro-grossly nonfocal  ECG atrial fibrillation at a rate of 51. Left axis deviation. Cannot rule out prior anterior infarct. Nonspecific ST changes.

## 2012-07-26 NOTE — Assessment & Plan Note (Signed)
Obtain outside records for review.

## 2012-07-26 NOTE — Assessment & Plan Note (Signed)
Blood pressure controlled. Continue present medications. 

## 2012-07-26 NOTE — Patient Instructions (Addendum)
Your physician recommends that you schedule a follow-up appointment in: ONE WEEK WITH DR Jens Som  Your physician recommends that you HAVE LAB WORK TODAY AND IN ONE WEEK  Your physician has requested that you have an echocardiogram. Echocardiography is a painless test that uses sound waves to create images of your heart. It provides your doctor with information about the size and shape of your heart and how well your heart's chambers and valves are working. This procedure takes approximately one hour. There are no restrictions for this procedure.   STOP DIGOXIN  INCREASE FUROSEMIDE TO 40 MG TWICE DAILY

## 2012-07-26 NOTE — Assessment & Plan Note (Addendum)
Patient's rate is mildly reduced.I will continue amiodarone and metoprolol for now but discontinue digoxin. Continue Coumadin. Repeat echocardiogram. Obtain previous records. Consider reattempt at cardioversion pending review of outside records. If we decide on rate control and anticoagulation then I would like to discontinue amiodarone long-term to avoid potential side effects. We will make that decision in the future once outside records are reviewed. INR is followed by primary care.

## 2012-07-26 NOTE — Assessment & Plan Note (Signed)
Recent TSH elevated and this is managed by primary care.

## 2012-07-27 ENCOUNTER — Other Ambulatory Visit: Payer: Self-pay

## 2012-07-27 MED ORDER — WARFARIN SODIUM 3 MG PO TABS: 3.0000 mg | ORAL_TABLET | Freq: Every evening | ORAL | Status: DC

## 2012-07-28 ENCOUNTER — Ambulatory Visit (HOSPITAL_COMMUNITY): Payer: Medicare Other | Attending: Cardiology

## 2012-07-28 DIAGNOSIS — R0602 Shortness of breath: Secondary | ICD-10-CM

## 2012-07-28 DIAGNOSIS — I4891 Unspecified atrial fibrillation: Secondary | ICD-10-CM

## 2012-07-28 DIAGNOSIS — R609 Edema, unspecified: Secondary | ICD-10-CM

## 2012-07-28 NOTE — Progress Notes (Signed)
Echocardiogram performed.  

## 2012-07-29 ENCOUNTER — Telehealth: Payer: Self-pay | Admitting: Cardiology

## 2012-07-29 ENCOUNTER — Other Ambulatory Visit: Payer: Self-pay

## 2012-07-29 MED ORDER — METOPROLOL SUCCINATE ER 25 MG PO TB24: 25.0000 mg | ORAL_TABLET | Freq: Every day | ORAL | Status: DC

## 2012-07-29 NOTE — Telephone Encounter (Signed)
ROI Faxed to [facility] at [phone] & [place] [facility] at [phone]   07/29/12/KM

## 2012-08-01 ENCOUNTER — Telehealth: Payer: Self-pay

## 2012-08-01 NOTE — Telephone Encounter (Signed)
Phone call from Aniceto Boss, RN with [employer] [phone]). INR on patient today is 5.3 and PT is 63.1. Marylene Land states a fax was sent on this regarding the patient but she wanted to place a call also. Any changes can be faxed to [facility] where patient resides.

## 2012-08-01 NOTE — Telephone Encounter (Signed)
Hold coumadin for 3 days, then recheck level.

## 2012-08-02 ENCOUNTER — Telehealth: Payer: Self-pay | Admitting: Cardiology

## 2012-08-02 ENCOUNTER — Ambulatory Visit (HOSPITAL_COMMUNITY)
Admission: RE | Admit: 2012-08-02 | Discharge: 2012-08-02 | Disposition: A | Discharge status: Home / Self Care | Payer: Medicare Other | Source: Ambulatory Visit | Attending: Cardiovascular Disease | Admitting: Cardiovascular Disease

## 2012-08-02 DIAGNOSIS — I739 Peripheral vascular disease, unspecified: Secondary | ICD-10-CM | POA: Insufficient documentation

## 2012-08-02 DIAGNOSIS — X58XXXA Exposure to other specified factors, initial encounter: Secondary | ICD-10-CM | POA: Insufficient documentation

## 2012-08-02 DIAGNOSIS — T07XXXA Unspecified multiple injuries, initial encounter: Secondary | ICD-10-CM

## 2012-08-02 NOTE — Telephone Encounter (Signed)
Records rec From [facility] gave to [facility] 08/02/12/KM

## 2012-08-02 NOTE — Telephone Encounter (Signed)
LM on machine for wellness center to hold coumadin for 3 days then recheck and to call Dr Debby Bud office with any questions.

## 2012-08-02 NOTE — Progress Notes (Signed)
TBIs and lower extremity arterial duplex doppler was completed. Larene Pickett RVT

## 2012-08-04 ENCOUNTER — Other Ambulatory Visit: Payer: Self-pay

## 2012-08-04 ENCOUNTER — Encounter: Payer: Self-pay | Admitting: Physician Assistant

## 2012-08-04 ENCOUNTER — Other Ambulatory Visit (INDEPENDENT_AMBULATORY_CARE_PROVIDER_SITE_OTHER): Payer: Medicare Other

## 2012-08-04 ENCOUNTER — Inpatient Hospital Stay (HOSPITAL_COMMUNITY)
Admission: AD | Admit: 2012-08-04 | Discharge: 2012-08-11 | DRG: 292 | Disposition: A | Discharge status: Home Health | Payer: Medicare Other | Source: Ambulatory Visit | Attending: Cardiology | Admitting: Cardiology

## 2012-08-04 ENCOUNTER — Ambulatory Visit (INDEPENDENT_AMBULATORY_CARE_PROVIDER_SITE_OTHER): Payer: Medicare Other | Admitting: Physician Assistant

## 2012-08-04 ENCOUNTER — Encounter (HOSPITAL_COMMUNITY): Payer: Self-pay | Admitting: General Practice

## 2012-08-04 VITALS — BP 126/72 | HR 68 | Ht 66.0 in | Wt 203.8 lb

## 2012-08-04 DIAGNOSIS — I10 Essential (primary) hypertension: Secondary | ICD-10-CM | POA: Diagnosis present

## 2012-08-04 DIAGNOSIS — I509 Heart failure, unspecified: Secondary | ICD-10-CM

## 2012-08-04 DIAGNOSIS — S81802A Unspecified open wound, left lower leg, initial encounter: Secondary | ICD-10-CM

## 2012-08-04 DIAGNOSIS — E079 Disorder of thyroid, unspecified: Secondary | ICD-10-CM

## 2012-08-04 DIAGNOSIS — R5381 Other malaise: Secondary | ICD-10-CM | POA: Diagnosis present

## 2012-08-04 DIAGNOSIS — M810 Age-related osteoporosis without current pathological fracture: Secondary | ICD-10-CM | POA: Diagnosis present

## 2012-08-04 DIAGNOSIS — I079 Rheumatic tricuspid valve disease, unspecified: Secondary | ICD-10-CM | POA: Diagnosis present

## 2012-08-04 DIAGNOSIS — I739 Peripheral vascular disease, unspecified: Secondary | ICD-10-CM | POA: Diagnosis present

## 2012-08-04 DIAGNOSIS — B9689 Other specified bacterial agents as the cause of diseases classified elsewhere: Secondary | ICD-10-CM | POA: Diagnosis not present

## 2012-08-04 DIAGNOSIS — I959 Hypotension, unspecified: Secondary | ICD-10-CM | POA: Diagnosis not present

## 2012-08-04 DIAGNOSIS — I4891 Unspecified atrial fibrillation: Secondary | ICD-10-CM

## 2012-08-04 DIAGNOSIS — E46 Unspecified protein-calorie malnutrition: Secondary | ICD-10-CM | POA: Diagnosis present

## 2012-08-04 DIAGNOSIS — L97509 Non-pressure chronic ulcer of other part of unspecified foot with unspecified severity: Secondary | ICD-10-CM | POA: Diagnosis present

## 2012-08-04 DIAGNOSIS — R0602 Shortness of breath: Secondary | ICD-10-CM

## 2012-08-04 DIAGNOSIS — I50810 Right heart failure, unspecified: Secondary | ICD-10-CM

## 2012-08-04 DIAGNOSIS — Z86718 Personal history of other venous thrombosis and embolism: Secondary | ICD-10-CM

## 2012-08-04 DIAGNOSIS — I498 Other specified cardiac arrhythmias: Secondary | ICD-10-CM | POA: Diagnosis not present

## 2012-08-04 DIAGNOSIS — I428 Other cardiomyopathies: Secondary | ICD-10-CM | POA: Diagnosis present

## 2012-08-04 DIAGNOSIS — E876 Hypokalemia: Secondary | ICD-10-CM | POA: Diagnosis present

## 2012-08-04 DIAGNOSIS — T07XXXA Unspecified multiple injuries, initial encounter: Secondary | ICD-10-CM

## 2012-08-04 DIAGNOSIS — L98499 Non-pressure chronic ulcer of skin of other sites with unspecified severity: Secondary | ICD-10-CM

## 2012-08-04 DIAGNOSIS — I080 Rheumatic disorders of both mitral and aortic valves: Secondary | ICD-10-CM | POA: Diagnosis present

## 2012-08-04 DIAGNOSIS — I70209 Unspecified atherosclerosis of native arteries of extremities, unspecified extremity: Secondary | ICD-10-CM

## 2012-08-04 DIAGNOSIS — Z79899 Other long term (current) drug therapy: Secondary | ICD-10-CM

## 2012-08-04 DIAGNOSIS — N179 Acute kidney failure, unspecified: Secondary | ICD-10-CM | POA: Diagnosis present

## 2012-08-04 DIAGNOSIS — Z7901 Long term (current) use of anticoagulants: Secondary | ICD-10-CM

## 2012-08-04 DIAGNOSIS — R609 Edema, unspecified: Secondary | ICD-10-CM

## 2012-08-04 DIAGNOSIS — I50811 Acute right heart failure: Secondary | ICD-10-CM

## 2012-08-04 DIAGNOSIS — E039 Hypothyroidism, unspecified: Secondary | ICD-10-CM | POA: Diagnosis present

## 2012-08-04 DIAGNOSIS — R0989 Other specified symptoms and signs involving the circulatory and respiratory systems: Secondary | ICD-10-CM

## 2012-08-04 DIAGNOSIS — S81809A Unspecified open wound, unspecified lower leg, initial encounter: Secondary | ICD-10-CM

## 2012-08-04 DIAGNOSIS — I5033 Acute on chronic diastolic (congestive) heart failure: Principal | ICD-10-CM | POA: Diagnosis present

## 2012-08-04 HISTORY — DX: Hypothyroidism, unspecified: E03.9

## 2012-08-04 HISTORY — DX: Personal history of other medical treatment: Z92.89

## 2012-08-04 HISTORY — DX: Anemia, unspecified: D64.9

## 2012-08-04 HISTORY — DX: Unspecified osteoarthritis, unspecified site: M19.90

## 2012-08-04 HISTORY — DX: Unspecified fracture of fourth lumbar vertebra, initial encounter for closed fracture: S32.049A

## 2012-08-04 LAB — COMPREHENSIVE METABOLIC PANEL
ALT: 17 U/L (ref 0–35)
Albumin: 2.7 g/dL — ABNORMAL LOW (ref 3.5–5.2)
Alkaline Phosphatase: 126 U/L — ABNORMAL HIGH (ref 39–117)
Potassium: 3.2 mEq/L — ABNORMAL LOW (ref 3.5–5.1)
Sodium: 140 mEq/L (ref 135–145)
Total Protein: 7.2 g/dL (ref 6.0–8.3)

## 2012-08-04 LAB — CBC WITH DIFFERENTIAL/PLATELET
Basophils Relative: 1 % (ref 0–1)
Eosinophils Absolute: 0.1 10*3/uL (ref 0.0–0.7)
MCH: 30.1 pg (ref 26.0–34.0)
MCHC: 32.6 g/dL (ref 30.0–36.0)
Neutrophils Relative %: 62 % (ref 43–77)
Platelets: 134 10*3/uL — ABNORMAL LOW (ref 150–400)
RDW: 16.7 % — ABNORMAL HIGH (ref 11.5–15.5)

## 2012-08-04 LAB — PROTIME-INR: INR: 4.71 — ABNORMAL HIGH (ref 0.00–1.49)

## 2012-08-04 LAB — PRO B NATRIURETIC PEPTIDE: Pro B Natriuretic peptide (BNP): 1382 pg/mL — ABNORMAL HIGH (ref 0–450)

## 2012-08-04 LAB — TSH: TSH: 35.753 u[IU]/mL — ABNORMAL HIGH (ref 0.350–4.500)

## 2012-08-04 LAB — SEDIMENTATION RATE: Sed Rate: 53 mm/hr — ABNORMAL HIGH (ref 0–22)

## 2012-08-04 LAB — APTT: aPTT: 78 seconds — ABNORMAL HIGH (ref 24–37)

## 2012-08-04 MED ORDER — SODIUM CHLORIDE 0.9 % IJ SOLN
3.0000 mL | Freq: Two times a day (BID) | INTRAMUSCULAR | Status: DC
  Administered 2012-08-04 – 2012-08-11 (×8): 3 mL via INTRAVENOUS

## 2012-08-04 MED ORDER — WARFARIN SODIUM 3 MG PO TABS
3.0000 mg | ORAL_TABLET | Freq: Every day | ORAL | Status: DC
  Filled 2012-08-04: qty 1

## 2012-08-04 MED ORDER — WARFARIN - PHYSICIAN DOSING INPATIENT: Freq: Every day | Status: DC

## 2012-08-04 MED ORDER — METOPROLOL SUCCINATE ER 25 MG PO TB24
25.0000 mg | ORAL_TABLET | Freq: Every day | ORAL | Status: DC
  Administered 2012-08-04 – 2012-08-08 (×5): 25 mg via ORAL
  Filled 2012-08-04 (×8): qty 1

## 2012-08-04 MED ORDER — OXYCODONE HCL ER 10 MG PO T12A
10.0000 mg | EXTENDED_RELEASE_TABLET | Freq: Two times a day (BID) | ORAL | Status: DC
  Administered 2012-08-04 – 2012-08-11 (×13): 10 mg via ORAL
  Filled 2012-08-04 (×14): qty 1

## 2012-08-04 MED ORDER — NYSTATIN 100000 UNIT/GM EX POWD: Freq: Four times a day (QID) | CUTANEOUS | Status: DC

## 2012-08-04 MED ORDER — SODIUM CHLORIDE 0.9 % IJ SOLN: 3.0000 mL | INTRAMUSCULAR | Status: DC | PRN

## 2012-08-04 MED ORDER — ADULT MULTIVITAMIN W/MINERALS CH
1.0000 | ORAL_TABLET | Freq: Every morning | ORAL | Status: DC
  Administered 2012-08-04 – 2012-08-11 (×8): 1 via ORAL
  Filled 2012-08-04 (×8): qty 1

## 2012-08-04 MED ORDER — CLOTRIMAZOLE 1 % EX CREA: 1.0000 "application " | TOPICAL_CREAM | Freq: Two times a day (BID) | CUTANEOUS | Status: DC

## 2012-08-04 MED ORDER — WARFARIN - PHARMACIST DOSING INPATIENT
Freq: Every day | Status: DC
  Administered 2012-08-07 – 2012-08-08 (×2)

## 2012-08-04 MED ORDER — NYSTATIN 100000 UNIT/GM EX POWD
Freq: Four times a day (QID) | CUTANEOUS | Status: DC
  Administered 2012-08-05 – 2012-08-08 (×6): via TOPICAL
  Filled 2012-08-04 (×2): qty 15

## 2012-08-04 MED ORDER — OXYCODONE HCL 5 MG PO TABS
5.0000 mg | ORAL_TABLET | Freq: Four times a day (QID) | ORAL | Status: DC | PRN
  Administered 2012-08-04 – 2012-08-09 (×4): 5 mg via ORAL
  Filled 2012-08-04 (×4): qty 1

## 2012-08-04 MED ORDER — POTASSIUM CHLORIDE CRYS ER 20 MEQ PO TBCR
40.0000 meq | EXTENDED_RELEASE_TABLET | Freq: Once | ORAL | Status: AC
  Administered 2012-08-04: 40 meq via ORAL
  Filled 2012-08-04: qty 2

## 2012-08-04 MED ORDER — OXYCODONE HCL 10 MG PO TB12
10.0000 mg | ORAL_TABLET | Freq: Two times a day (BID) | ORAL | Status: DC
  Filled 2012-08-04 (×2): qty 1

## 2012-08-04 MED ORDER — LEVOTHYROXINE SODIUM 75 MCG PO TABS
37.5000 ug | ORAL_TABLET | Freq: Every day | ORAL | Status: DC
  Administered 2012-08-05 – 2012-08-06 (×2): 37.5 ug via ORAL
  Filled 2012-08-04 (×3): qty 0.5

## 2012-08-04 MED ORDER — SODIUM CHLORIDE 0.9 % IV SOLN
250.0000 mL | INTRAVENOUS | Status: DC | PRN
  Administered 2012-08-04: 10 mL/h via INTRAVENOUS
  Administered 2012-08-09: 250 mL via INTRAVENOUS

## 2012-08-04 MED ORDER — ACETAMINOPHEN 325 MG PO TABS: 650.0000 mg | ORAL_TABLET | Freq: Every day | ORAL | Status: DC | PRN

## 2012-08-04 MED ORDER — POTASSIUM CHLORIDE CRYS ER 20 MEQ PO TBCR
20.0000 meq | EXTENDED_RELEASE_TABLET | Freq: Two times a day (BID) | ORAL | Status: DC
  Administered 2012-08-04 – 2012-08-09 (×12): 20 meq via ORAL
  Filled 2012-08-04 (×14): qty 1

## 2012-08-04 MED ORDER — ONDANSETRON HCL 4 MG/2ML IJ SOLN: 4.0000 mg | Freq: Four times a day (QID) | INTRAMUSCULAR | Status: DC | PRN

## 2012-08-04 MED ORDER — DEXTROSE 5 % IV SOLN
4.0000 mg/h | INTRAVENOUS | Status: DC
  Administered 2012-08-04 – 2012-08-08 (×5): 10 mg/h via INTRAVENOUS
  Filled 2012-08-04 (×11): qty 25

## 2012-08-04 MED ORDER — FUROSEMIDE 10 MG/ML IJ SOLN: 80.0000 mg | Freq: Two times a day (BID) | INTRAMUSCULAR | Status: DC

## 2012-08-04 NOTE — Plan of Care (Signed)
Problem: Food- and Nutrition-Related Knowledge Deficit (NB-1.1) Goal: Nutrition education Formal process to instruct or train a patient/client in a skill or to impart knowledge to help patients/clients voluntarily manage or modify food choices and eating behavior to maintain or improve health.  Nutrition Education Note  RD consulted for nutrition education regarding new onset CHF. Pt is familiar with low sodium education and has been instructed to follow a low sodium diet in the past.  RD provided "Low Sodium Nutrition Therapy" handout from the Academy of Nutrition and Dietetics. Reviewed patient's dietary recall. Provided examples on ways to decrease sodium intake in diet. Discouraged intake of processed foods and use of salt shaker. Encouraged fresh fruits and vegetables as well as whole grain sources of carbohydrates to maximize fiber intake.   RD discussed why it is important for patient to adhere to diet recommendations, and emphasized the role of fluids, foods to avoid, and importance of weighing self daily. Teach back method used.  Expect good compliance.  Body mass index is 32.93 kg/(m^2). Pt meets criteria for Obese Class I based on current BMI.  Current diet order is Heart Heatly. Labs and medications reviewed. No further nutrition interventions warranted at this time. RD contact information provided. If additional nutrition issues arise, please re-consult RD.   Kristina Motto MS, RD, LDN Pager: [phone] After-hours pager: [phone]

## 2012-08-04 NOTE — Addendum Note (Signed)
Addended byAlben Spittle, Lorin Picket T on: 08/04/2012 09:17 PM   Modules accepted: Level of Service

## 2012-08-04 NOTE — Consult Note (Signed)
WOC consult Note Reason for Consult: Consult requested for left leg wound.  Pt is followed as an outpatient by the wound care center [facility]). Necrotic area to tip of left great toe with dry stable eschar, WCC has told her to leave open to air without topical treatment to this site. Wound type: Chronic full thickness stasis ulcer to left posterior leg Measurement:6X4X.4cm, outer leg .2X.2X.2cm Wound bed:50% red, 50% yellow Drainage (amount, consistency, odor) Large amt yellow-green tinged drainage with strong odor Periwound:Generalized edema and erythremia to left leg. Dressing procedure/placement/frequency: Aquacel to provide antimicrobial benefits and absorb drainage.  Pt states WCC has ordered dressing changes 3 times week.  Pt can resume follow up with the outpatient center after discharge. Please re-consult if further assistance is needed.  Thank-you,  Cammie Mcgee MSN, RN, CWOCN, [place], CNS [phone]

## 2012-08-04 NOTE — Progress Notes (Signed)
Received call from nursing regarding VQ/PFTs - pt unable to lie flat thus per nuc med cannot go for VQ yet and nursing states she is also not eligible to go down for PFTs yet. Will continue diuresis through today and reassess tomorrow. She remains on Coumadin per pharmacy. Chynna Buerkle PA-C

## 2012-08-04 NOTE — H&P (Signed)
History and Physical  Date:  08/04/2012   ID:  Kristina Diaz, DOB [birth year], MRN [id number]  PCP:  Illene Regulus, MD  Primary Cardiologist:  Dr. Olga Millers    History of Present Illness: Kristina Diaz is a 79 y.o. female who returns for f/u on CHF and AFib.   She recently established with Dr. Olga Millers.  She moved here from [place] recently.  She has a hx of AFib and LE edema, HTN, hypothyroidism.  Lasix was increased to 40 mg bid.  LE dopplers done in 3/14 were technically limited btu neg for DVT.  Records were requested which I have reviewed today.  She was dx with AFib with RVR in 05/2011. Echo at that time demonstrated EF 25%.  She was initially not placed on anticoagulation due to fall risk.  She was rate controlled and eventually placed on Xarelto 15 (low GFR).  She declined ischemic evaluation or admission to the hospital.  She was ultimately set up for TEE-DCCV in 03/2012 which was unsuccessful after multiple attempts.  F/u echo demonstrated improved EF at 55%.  Echo done here 07/28/12: EF 55%, normal wall motion, restrictive physiology indicative of decreased LV diastolic compliance and/or increased LA pressure, D-shaped interventricular septum suggesting RV pressure/volume overload, mild AI, mild MR, moderate to severe LAE, mild to moderate RVE, mild to moderate reduced RV systolic function, severe RAE, severe TR, PASP 42, possible pleural effusion and trivial pericardial effusion.  Since being seen, she notes her legs are about the same.  She is fairly sedentary.  No dyspnea.  No CP.  No orthopnea, PND.  No syncope.   Labs (2/14):  Hgb 10.2 Labs (4/14):  K 3.9, Cr 1.3, proBNP 641  Wt Readings from Last 3 Encounters:  08/04/12 203 lb 12.8 oz (92.443 kg)  07/26/12 190 lb (86.183 kg)  06/29/12 195 lb (88.451 kg)     Past Medical History  Diagnosis Date  . Blood transfusion without reported diagnosis 2013    Had DVT and did require transfusion  . PAD (peripheral artery  disease) 2013    left leg. sounds like she had angio-plasty   . Atrial fibrillation 2013    s/p [facility] that failed. ON medication  . Hypertension   . Thyroid disease 2013    hypothyroidism  . Osteoporosis   . DVT (deep venous thrombosis)     Current Outpatient Prescriptions  Medication Sig Dispense Refill  . acetaminophen (TYLENOL) 325 MG tablet Take 650 mg by mouth daily as needed for pain.       Marland Kitchen amiodarone (PACERONE) 200 MG tablet Take 200 mg by mouth every morning.       . clotrimazole (LOTRIMIN) 1 % cream Apply 1 application topically 2 (two) times daily. Apply for 14 days.  Order date 06/10/2012.      . furosemide (LASIX) 40 MG tablet Take 1 tablet (40 mg total) by mouth 2 (two) times daily.  60 tablet  12  . levothyroxine (SYNTHROID, LEVOTHROID) 25 MCG tablet TAKE 1 AND 1/2 TABS (37.5MG ) BY MOUTH EVERY DAY*CHECK PULSE WEEKLY*  45 tablet  11  . metoprolol succinate (TOPROL-XL) 25 MG 24 hr tablet Take 1 tablet (25 mg total) by mouth daily. Hold for SBP <110 and HR <60 and call MD.  30 tablet  11  . Multiple Vitamin (MULTIVITAMIN WITH MINERALS) TABS Take 1 tablet by mouth every morning.      . nystatin (MYCOSTATIN) powder Apply topically 4 (four) times daily.  15 g  11  . oxyCODONE (OXY IR/ROXICODONE) 5 MG immediate release tablet Take 5 mg by mouth 4 (four) times daily as needed for pain.      Marland Kitchen oxyCODONE (OXYCONTIN) 10 MG 12 hr tablet Take 10 mg by mouth 2 (two) times daily.       . potassium chloride (K-DUR,KLOR-CON) 10 MEQ tablet Take 1 tablet (10 mEq total) by mouth every morning.  90 tablet  1  . warfarin (COUMADIN) 3 MG tablet Take 1 tablet (3 mg total) by mouth every evening.  30 tablet  11   No current facility-administered medications for this visit.    Allergies:   No Known Allergies  Social History:  The patient  reports that she has never smoked. She has never used smokeless tobacco. She reports that she drinks about 1.5 ounces of alcohol per week. She reports that she  does not use illicit drugs.   Family History:  The patient's family history includes Arthritis in her mother; Cancer in her brother, mother, and sister; and Tuberculosis in her father.  There is no history of Diabetes, and Heart disease, and COPD, .   ROS:  Please see the history of present illness.     All other systems reviewed and negative.   PHYSICAL EXAM: VS:  BP 126/72  Pulse 68  Ht 5\' 6"  (1.676 m)  Wt 203 lb 12.8 oz (92.443 kg)  BMI 32.91 kg/m2 Well nourished, well developed, in no acute distress HEENT: normal Neck: + JVD at 90 degrees Cardiac:  distant S1, S2; irreg irreg rhythm, 1-2/6 systolic murmur LLSB Lungs:  clear to auscultation bilaterally, no wheezing, rhonchi or rales Abd: edematous Ext: tight 4+ edema to the thighs Skin: warm and dry Neuro:  CNs 2-12 intact, no focal abnormalities noted  EKG:  AFib, HR 68, LAD, low voltage     ASSESSMENT AND PLAN:  1. Right Sided CHF:  She is massively volume overloaded.  She has made no improvement since last seen.  I had a long d/w the patient and her husband.  I have recommended admission to the hospital for IV diuresis.  She is hesitant but agrees.  Will plan on Lasix 80 mg IV BID and K+ 20 bid.  Follow renal fxn closely. Will obtain HIV, ANA, RF, ESR.  Will likely need RHC as well to direct Rx.  Will get Dr. Arvilla Meres to see for CHF service.  Will likely need PFTs.  Will go ahead and get a VQ scan now to exclude PE.   2. Atrial Fibrillation:  Permanent.  Unsuccessful attempt at DCCV in the past.  Will go ahead and d/c amiodarone.  Titrate other meds for HR as needed.   3. Cardiomyopathy:  Likely had NICM probably tachycardia mediated.  EF recovered with better HR.   EF still ok by recent echo.  She has declined ischemic eval in the past.  Can consider functional study. 4. PAD:  Questionable hx.  No records received on this.  She has a scar on her left leg and notes a hx of thrombectomy. 5. Foot Ulcer:  She has been  followed by wound care for a foot ulcer.  Will get wound care RN to see her as well. 6. Hypothyroidism:  Check TSH. 7. Hypertension:  Controlled.  Continue current therapy.  8. Disposition:  Admit to [facility] today.  Signed, Tereso Newcomer, PA-C  9:34 AM 08/04/2012     As above, patient seen and examined. Briefly she is an 79 year old  female who I recently saw for the first time with congestive heart failure and atrial fibrillation. She declined admission but was massively volume overloaded at that time. We increased her Lasix. We repeated an echocardiogram that showed an ejection fraction of 55%, restrictive physiology, RV pressure/volume overload, mild mitral and aortic insufficiency, right-sided enlargement, severe tricuspid regurgitation. Despite increasing her Lasix her edema is essentially unchanged. She denies chest pain or dyspnea. On exam she is massively volume overloaded. Patient has right-sided congestive heart failure. She is now agreeable to admission. Begin Lasix 80 mg IV twice a day and follow renal function. She may also require milrinone to assist with RV function. She will ultimately require right heart catheterization and may require vasodilators if she has pulmonary hypertension. I will ask the congestive heart failure/pulmonary hypertension service to evaluate. Check HIV, ANA, rheumatoid factor and sedimentation rate. She will also need pulmonary function tests and a VQ scan. She is in permanent atrial fibrillation and apparently failed cardioversion previously. Discontinue amiodarone and continue Toprol. I will increase this as needed for rate control. Hold Coumadin in anticipation of right heart catheterization. Olga Millers 11:17 AM

## 2012-08-04 NOTE — Progress Notes (Signed)
Utilization Review Completed.Kristina Diaz T4/01/2013  

## 2012-08-04 NOTE — Patient Instructions (Signed)
YOU HAVE BEEN ADMITTED TODAY TO [facility] TO THE [facility] UNIT

## 2012-08-04 NOTE — Telephone Encounter (Signed)
Crystal [place] with [address] [facility] [employer] [phone]) called stating pt is out of Oxycontin 10 mg and needs a new script faxed to [phone]. She states she sent a fax on 08/01/12 requesting this. Please advise.

## 2012-08-04 NOTE — Progress Notes (Signed)
   Patient seen and examined. She has evidence of restrictive cardiomyopathy on echo with right heart strain. Now admitted with severe RHF with probably 40-50 pounds of volume on board. Will place on lasix drip.   If not responding briskly will need RHC and probable inotropes.   Daniel Bensimhon,MD 5:50 PM

## 2012-08-04 NOTE — Progress Notes (Signed)
[address], [address] [place], [place]  [address] Phone: [phone] Fax:  [phone]  Date:  08/04/2012   ID:  Kristina Diaz, DOB Aug 15, 1929, MRN [id number]  PCP:  Illene Regulus, MD  Primary Cardiologist:  Dr. Olga Millers    History of Present Illness: Kristina Diaz is a 79 y.o. female who returns for f/u on CHF and AFib.   She recently established with Dr. Olga Millers.  She moved here from [place] recently.  She has a hx of AFib and LE edema, HTN, hypothyroidism.  Lasix was increased to 40 mg bid.  LE dopplers done in 3/14 were technically limited btu neg for DVT.  Records were requested which I have reviewed today.  She was dx with AFib with RVR in 05/2011. Echo at that time demonstrated EF 25%.  She was initially not placed on anticoagulation due to fall risk.  She was rate controlled and eventually placed on Xarelto 15 (low GFR).  She declined ischemic evaluation or admission to the hospital.  She was ultimately set up for TEE-DCCV in 03/2012 which was unsuccessful after multiple attempts.  F/u echo demonstrated improved EF at 55%.  Echo done here 07/28/12: EF 55%, normal wall motion, restrictive physiology indicative of decreased LV diastolic compliance and/or increased LA pressure, D-shaped interventricular septum suggesting RV pressure/volume overload, mild AI, mild MR, moderate to severe LAE, mild to moderate RVE, mild to moderate reduced RV systolic function, severe RAE, severe TR, PASP 42, possible pleural effusion and trivial pericardial effusion.  Since being seen, she notes her legs are about the same.  She is fairly sedentary.  No dyspnea.  No CP.  No orthopnea, PND.  No syncope.   Labs (2/14):  Hgb 10.2 Labs (4/14):  K 3.9, Cr 1.3, proBNP 641  Wt Readings from Last 3 Encounters:  08/04/12 203 lb 12.8 oz (92.443 kg)  07/26/12 190 lb (86.183 kg)  06/29/12 195 lb (88.451 kg)     Past Medical History  Diagnosis Date  . Blood transfusion without reported  diagnosis 2013    Had DVT and did require transfusion  . PAD (peripheral artery disease) 2013    left leg. sounds like she had angio-plasty   . Atrial fibrillation 2013    s/p [facility] that failed. ON medication  . Hypertension   . Thyroid disease 2013    hypothyroidism  . Osteoporosis   . DVT (deep venous thrombosis)     Current Outpatient Prescriptions  Medication Sig Dispense Refill  . acetaminophen (TYLENOL) 325 MG tablet Take 650 mg by mouth daily as needed for pain.       Marland Kitchen amiodarone (PACERONE) 200 MG tablet Take 200 mg by mouth every morning.       . clotrimazole (LOTRIMIN) 1 % cream Apply 1 application topically 2 (two) times daily. Apply for 14 days.  Order date 06/10/2012.      . furosemide (LASIX) 40 MG tablet Take 1 tablet (40 mg total) by mouth 2 (two) times daily.  60 tablet  12  . levothyroxine (SYNTHROID, LEVOTHROID) 25 MCG tablet TAKE 1 AND 1/2 TABS (37.5MG ) BY MOUTH EVERY DAY*CHECK PULSE WEEKLY*  45 tablet  11  . metoprolol succinate (TOPROL-XL) 25 MG 24 hr tablet Take 1 tablet (25 mg total) by mouth daily. Hold for SBP <110 and HR <60 and call MD.  30 tablet  11  . Multiple Vitamin (MULTIVITAMIN WITH MINERALS) TABS Take 1 tablet by mouth every morning.      Marland Kitchen  nystatin (MYCOSTATIN) powder Apply topically 4 (four) times daily.  15 g  11  . oxyCODONE (OXY IR/ROXICODONE) 5 MG immediate release tablet Take 5 mg by mouth 4 (four) times daily as needed for pain.      Marland Kitchen oxyCODONE (OXYCONTIN) 10 MG 12 hr tablet Take 10 mg by mouth 2 (two) times daily.       . potassium chloride (K-DUR,KLOR-CON) 10 MEQ tablet Take 1 tablet (10 mEq total) by mouth every morning.  90 tablet  1  . warfarin (COUMADIN) 3 MG tablet Take 1 tablet (3 mg total) by mouth every evening.  30 tablet  11   No current facility-administered medications for this visit.    Allergies:   No Known Allergies  Social History:  The patient  reports that she has never smoked. She has never used smokeless tobacco. She  reports that she drinks about 1.5 ounces of alcohol per week. She reports that she does not use illicit drugs.   Family History:  The patient's family history includes Arthritis in her mother; Cancer in her brother, mother, and sister; and Tuberculosis in her father.  There is no history of Diabetes, and Heart disease, and COPD, .   ROS:  Please see the history of present illness.     All other systems reviewed and negative.   PHYSICAL EXAM: VS:  BP 126/72  Pulse 68  Ht 5\' 6"  (1.676 m)  Wt 203 lb 12.8 oz (92.443 kg)  BMI 32.91 kg/m2 Well nourished, well developed, in no acute distress HEENT: normal Neck: + JVD at 90 degrees Cardiac:  distant S1, S2; irreg irreg rhythm, 1-2/6 systolic murmur LLSB Lungs:  clear to auscultation bilaterally, no wheezing, rhonchi or rales Abd: edematous Ext: tight 4+ edema to the thighs Skin: warm and dry Neuro:  CNs 2-12 intact, no focal abnormalities noted  EKG:  AFib, HR 68, LAD, low voltage     ASSESSMENT AND PLAN:  1. Right Sided CHF:  She is massively volume overloaded.  She has made no improvement since last seen.  I had a long d/w the patient and her husband.  I have recommended admission to the hospital for IV diuresis.  She is hesitant but agrees.  Will plan on Lasix 80 mg IV BID and K+ 20 bid.  Follow renal fxn closely. Will obtain HIV, ANA, RF, ESR.  Will likely need RHC as well to direct Rx.  Will get Dr. Arvilla Meres to see for CHF service.  Will likely need PFTs.  Will go ahead and get a VQ scan now to exclude PE.   2. Atrial Fibrillation:  Permanent.  Unsuccessful attempt at DCCV in the past.  Will go ahead and d/c amiodarone.  Titrate other meds for HR as needed.   3. Cardiomyopathy:  Likely had NICM probably tachycardia mediated.  EF recovered with better HR.   EF still ok by recent echo.  She has declined ischemic eval in the past.  Can consider functional study. 4. PAD:  Questionable hx.  No records received on this.  She has a scar on  her left leg and notes a hx of thrombectomy. 5. Foot Ulcer:  She has been followed by wound care for a foot ulcer.  Will get wound care RN to see her as well. 6. Hypothyroidism:  Check TSH. 7. Hypertension:  Controlled.  Continue current therapy.  8. Disposition:  Admit to [facility] today.  Luna Glasgow, PA-C  9:34 AM 08/04/2012

## 2012-08-05 ENCOUNTER — Inpatient Hospital Stay (HOSPITAL_COMMUNITY): Payer: Medicare Other

## 2012-08-05 ENCOUNTER — Encounter (HOSPITAL_COMMUNITY): Payer: Medicare Other

## 2012-08-05 LAB — MRSA PCR SCREENING: MRSA by PCR: NEGATIVE

## 2012-08-05 LAB — BASIC METABOLIC PANEL
BUN: 16 mg/dL (ref 6–23)
CO2: 31 mEq/L (ref 19–32)
Chloride: 102 mEq/L (ref 96–112)
Glucose, Bld: 84 mg/dL (ref 70–99)
Potassium: 3.8 mEq/L (ref 3.5–5.1)
Sodium: 138 mEq/L (ref 135–145)

## 2012-08-05 LAB — TROPONIN I: Troponin I: <0.3 ng/mL (ref <0.30)

## 2012-08-05 LAB — PROTIME-INR: INR: 3.94 — ABNORMAL HIGH (ref 0.00–1.49)

## 2012-08-05 LAB — CARBOXYHEMOGLOBIN: Total hemoglobin: 8.8 g/dL — ABNORMAL LOW (ref 12.0–16.0)

## 2012-08-05 LAB — T4, FREE: Free T4: 0.71 ng/dL — ABNORMAL LOW (ref 0.80–1.80)

## 2012-08-05 MED ORDER — METOLAZONE 2.5 MG PO TABS
2.5000 mg | ORAL_TABLET | Freq: Every day | ORAL | Status: DC
  Administered 2012-08-05 – 2012-08-08 (×4): 2.5 mg via ORAL
  Filled 2012-08-05 (×5): qty 1

## 2012-08-05 MED ORDER — SODIUM CHLORIDE 0.9 % IJ SOLN
10.0000 mL | Freq: Two times a day (BID) | INTRAMUSCULAR | Status: DC
  Administered 2012-08-05: 20 mL
  Administered 2012-08-06 – 2012-08-11 (×8): 10 mL

## 2012-08-05 MED ORDER — SODIUM CHLORIDE 0.9 % IJ SOLN: 10.0000 mL | INTRAMUSCULAR | Status: DC | PRN

## 2012-08-05 NOTE — Clinical Social Work Psychosocial (Signed)
     Clinical Social Work Department BRIEF PSYCHOSOCIAL ASSESSMENT 08/05/2012  Patient:  Kristina Diaz, Kristina Diaz     Account Number:  [id number]     Admit date:  08/04/2012  Clinical Social Worker:  Lourdes Sledge  Date/Time:  08/05/2012 11:29 AM  Referred by:  RN  Date Referred:  08/05/2012 Referred for  ALF Placement   Other Referral:   Interview type:  Patient Other interview type:    PSYCHOSOCIAL DATA Living Status:  FACILITY Admitted from facility:  [facility] Level of care:  Assisted Living Primary support name:  Judene Logue [phone] Primary support relationship to patient:  CHILD, ADULT Degree of support available:   Pt states her son lives in [place] and is involved in pt care. Pt also states she has a good amount of support at her ALF.    CURRENT CONCERNS Current Concerns  Post-Acute Placement   Other Concerns:    SOCIAL WORK ASSESSMENT / PLAN CSW informed that pt was admitted from [facility].    CSW visited pt room and introduced herself and role. CSW explored pt living situation and amount of support available. Pt was very pleasant and confirmed that she is a resident of [facility] where she and her husband have been for 6 weeks since moving from [place]. Pt stated that she has very supportive children who are involved in her care. Pt is very pleased with her care at [facility] confirms she receives PT/OT/RN care (for wound). Pt is agreeable to return at discharge.    CSW contacted [facility] and spoke with [employer] who informed CSW that pt does receive HH services through [facility]. Crystal requests that FL2 be faxed to [phone] on the day of discharge to confirm that pt is able to return.   Assessment/plan status:  Psychosocial Support/Ongoing Assessment of Needs Other assessment/ plan:   Information/referral to community resources:   No resources needed at this time.    PATIENTS/FAMILYS RESPONSE TO PLAN OF CARE: Pt  laying in bed, alert and oriented. Pt was very pleasant to speak with. Pt is agreeable to return at discharge.

## 2012-08-05 NOTE — Progress Notes (Signed)
Peripherally Inserted Central Catheter/Midline Placement  The IV Nurse has discussed with the patient and/or persons authorized to consent for the patient, the purpose of this procedure and the potential benefits and risks involved with this procedure.  The benefits include less needle sticks, lab draws from the catheter and patient may be discharged home with the catheter.  Risks include, but not limited to, infection, bleeding, blood clot (thrombus formation), and puncture of an artery; nerve damage and irregular heat beat.  Alternatives to this procedure were also discussed.  PICC/Midline Placement Documentation        Stacie Glaze Horton 08/05/2012, 2:40 PM

## 2012-08-05 NOTE — Progress Notes (Signed)
PT Cancellation Note  Patient Details Name: Kristina Diaz MRN: [id number] DOB: [birth year]   Cancelled Treatment:    Reason Eval/Treat Not Completed: Patient at procedure or test/unavailable (Pt having PICC placed.)   [name] 08/05/2012, 2:36 PM

## 2012-08-05 NOTE — Progress Notes (Signed)
ANTICOAGULATION CONSULT NOTE - Initial Consult  Pharmacy Consult for Coumadin  Indication: atrial fibrillation  No Known Allergies  Patient Measurements: Height: 5\' 6"  (167.6 cm) Weight: 200 lb 13.4 oz (91.1 kg) IBW/kg (Calculated) : 59.3   Vital Signs: Temp: 98.8 F (37.1 C) (04/11 0615) Temp src: Oral (04/11 0615) BP: 98/45 mmHg (04/11 0615) Pulse Rate: 70 (04/11 0615)  Labs:  Recent Labs  08/04/12 1414 08/04/12 1415 08/04/12 1717 08/04/12 2335 08/05/12 0555  HGB 9.5*  --   --   --   --   HCT 29.1*  --   --   --   --   PLT 134*  --   --   --   --   APTT 78*  --   --   --   --   LABPROT 41.3*  --   --   --  36.2*  INR 4.71*  --   --   --  3.94*  CREATININE 1.13*  --   --   --  1.13*  TROPONINI  --  <0.30 <0.30 <0.30  --     Estimated Creatinine Clearance: 45.1 ml/min (by C-G formula based on Cr of 1.13).   Medical History: Past Medical History  Diagnosis Date  . PAD (peripheral artery disease) 2013    left leg. sounds like she had angio-plasty   . Atrial fibrillation 2013    s/p [facility] that failed. ON medication  . Hypertension   . Osteoporosis   . DVT (deep venous thrombosis) 2013    "LLE" (08/04/2012)  . Hypothyroidism   . History of blood transfusion 2013    "related to thrombectomy" (08/04/2012)  . Anemia     "slightly" (08/04/2012)  . Arthritis     "probably minor; knees, pinky" (08/04/2012)  . Fracture of L4 vertebra     "dx'd in 2013; can't treat til legs fixed" (08/04/2012)      Assessment: 82yof recently moved here from [place], [place].  She has recent hx of LEE with minimal improvement with po furosemide as outpt.  She is admitted with SOB and LEE.  She has Hx A-Fib and is anticoagulated with Coumadin.  INR on admit 4.7> 3.94today, her last dose was 4/8.  Her INR was elevated 4/9 at ALF and Coumadin dose held 4/9 per ALF.  No bleeding noted.  CBC low stable on admit.    Goal of Therapy:  INR 2-3 Monitor platelets by anticoagulation protocol:  Yes   Plan:  Continue to hold coumadin for now Daily INR  Leota Sauers Pharm.D. CPP, BCPS Clinical Pharmacist [phone] 08/05/2012 9:46 AM

## 2012-08-05 NOTE — Progress Notes (Signed)
Advanced Heart Failure Rounding Note   Subjective:    Kristina Diaz is a 79 y.o. female who returns for f/u on CHF and AFib.   She recently established with Dr. Olga Millers. She moved here from [place] recently. She has a hx of AFib and LE edema, HTN, hypothyroidism. Lasix was increased to 40 mg bid. LE dopplers done in 3/14 were technically limited btu neg for DVT. Records were requested which I have reviewed today. She was dx with AFib with RVR in 05/2011. Echo at that time demonstrated EF 25%. She was initially not placed on anticoagulation due to fall risk. She was rate controlled and eventually placed on Xarelto 15 (low GFR). She declined ischemic evaluation or admission to the hospital. She was ultimately set up for TEE-DCCV in 03/2012 which was unsuccessful after multiple attempts. F/u echo demonstrated improved EF at 55%.  Echo done here 07/28/12: EF 55%, normal wall motion, restrictive physiology indicative of decreased LV diastolic compliance and/or increased LA pressure, D-shaped interventricular septum suggesting RV pressure/volume overload, mild AI, mild MR, moderate to severe LAE, mild to moderate RVE, mild to moderate reduced RV systolic function, severe RAE, severe TR, PASP 42, possible pleural effusion and trivial pericardial effusion.    Yesterday she was admitted from Dr Waunita Schooner office with massive volume overload/RHF. Admit weight 203. Weight to 213 today. ? Accuracy.  Placed on Lasix drip at 15 mg per hour. Sluggish diuresis noted -920 .   Denies SOB/PND/Orthopnea  TSH 35.7     Objective:   Weight Range:  Vital Signs:   Temp:  [98.3 F (36.8 C)-98.8 F (37.1 C)] 98.8 F (37.1 C) (04/11 0615) Pulse Rate:  [62-70] 70 (04/11 0615) Resp:  [17-18] 18 (04/11 0615) BP: (98-101)/(45-53) 98/45 mmHg (04/11 0615) SpO2:  [94 %-98 %] 94 % (04/11 0615) Weight:  [91.1 kg (200 lb 13.4 oz)] 91.1 kg (200 lb 13.4 oz) (04/11 0615) Last BM Date: 08/03/12  Weight change: Filed  Weights   08/04/12 1125 08/04/12 1154 08/05/12 0615  Weight: 92.5 kg (203 lb 14.8 oz) 92.5 kg (203 lb 14.8 oz) 91.1 kg (200 lb 13.4 oz)    Intake/Output:   Intake/Output Summary (Last 24 hours) at 08/05/12 1405 Last data filed at 08/05/12 0730  Gross per 24 hour  Intake    480 ml  Output   1400 ml  Net   -920 ml     Physical Exam: General:  Chronically ill appearing. No resp difficulty HEENT: normal Neck: supple. JVP to jaw  . Carotids 2+ bilat; no bruits. No lymphadenopathy or thryomegaly appreciated. Cor: PMI nondisplaced. Irregular rate & rhythm. No rubs, gallops or murmurs. Lungs: clear Abdomen: soft, nontender, nondistended. No hepatosplenomegaly. No bruits or masses. Good bowel sounds. Extremities: no cyanosis, clubbing, rash, R and LLE 3-4+ edema Neuro: alert & orientedx3, cranial nerves grossly intact. moves all 4 extremities w/o difficulty. Affect pleasant  Telemetry: Afib  Labs: Basic Metabolic Panel:  Recent Labs Lab 08/04/12 1414 08/05/12 0555  NA 140 138  K 3.2* 3.8  CL 101 102  CO2 32 31  GLUCOSE 109* 84  BUN 17 16  CREATININE 1.13* 1.13*  CALCIUM 8.8 8.7    Liver Function Tests:  Recent Labs Lab 08/04/12 1414  AST 38*  ALT 17  ALKPHOS 126*  BILITOT 1.4*  PROT 7.2  ALBUMIN 2.7*   No results found for this basename: LIPASE, AMYLASE,  in the last 168 hours No results found for this basename: AMMONIA,  in  the last 168 hours  CBC:  Recent Labs Lab 08/04/12 1414  WBC 4.1  NEUTROABS 2.6  HGB 9.5*  HCT 29.1*  MCV 92.1  PLT 134*    Cardiac Enzymes:  Recent Labs Lab 08/04/12 1415 08/04/12 1717 08/04/12 2335  TROPONINI <0.30 <0.30 <0.30    BNP: BNP (last 3 results)  Recent Labs  07/26/12 1232 08/04/12 1415  PROBNP 641.0* 1382.0*     Other results:  EKG:   Imaging: Dg Chest Port 1 View  08/05/2012  *RADIOLOGY REPORT*  Clinical Data: Congestive heart failure.  PORTABLE CHEST - 1 VIEW  Comparison: 06/23/2012.   Findings: Trachea is midline.  Heart is large.  Biapical pleural parenchymal scarring.  Small to moderate left pleural effusion. Associated left basilar airspace disease.  Small right pleural effusion.  IMPRESSION:  1.  Small to moderate left pleural effusion with left basilar air space disease. 2.  Small right pleural effusion.   Original Report Authenticated By: Leanna Battles, M.D.      Medications:     Scheduled Medications: . levothyroxine  37.5 mcg Oral QAC breakfast  . metolazone  2.5 mg Oral Daily  . metoprolol succinate  25 mg Oral Daily  . multivitamin with minerals  1 tablet Oral q morning - 10a  . nystatin   Topical QID  . OxyCODONE  10 mg Oral Q12H  . potassium chloride  20 mEq Oral BID  . sodium chloride  3 mL Intravenous Q12H  . Warfarin - Pharmacist Dosing Inpatient   Does not apply q1800    Infusions: . furosemide (LASIX) infusion 10 mg/hr (08/04/12 1659)    PRN Medications: sodium chloride, acetaminophen, ondansetron (ZOFRAN) IV, oxyCODONE, sodium chloride   Assessment:  1. A/C diastolic heart failure 2. Restrictive Cardiomyopathy 3. A Fib 4. Deconditioning 6. Malnutrition - Albumin 2.7 7. Hypokalemia 8. L 4 fractrue   Plan/Discussion:    Continue with massive volume overload. Responding poorly to lasix drip. Will place PICC and will likely need milrinone. Will follow co-ox and CVPs.  TSH elevated. Check free T 4  Consult PT. Consult SW- return to assisted living versus SNF.   Length of Stay: 1   Daniel BensimhonMD 08/05/2012, 2:05 PM   Advanced Heart Failure Team Pager [phone] (7a - 4p)  Please contact [facility] for night-coverage after hours (4p -7a ) on [email or web address]

## 2012-08-05 NOTE — Care Management Note (Addendum)
    Page 1 of 2   08/11/2012     9:59:10 AM   CARE MANAGEMENT NOTE 08/11/2012  Patient:  Kristina Diaz, Kristina Diaz   Account Number:  [id number]  Date Initiated:  08/05/2012  Documentation initiated by:  Junius Creamer  Subjective/Objective Assessment:   adm w heart failure     Action/Plan:   [facility], sw ref made, pcp dr Casimiro Needle norins   Anticipated DC Date:     Anticipated DC Plan:  HOME W HOME HEALTH SERVICES  In-house referral  Clinical Social Worker      DC Planning Services  CM consult      [facility] Choice  Resumption Of Svcs/PTA Provider   Choice offered to / List presented to:          [facility] arranged  HH-1 RN      [facility] agency  [facility]   Status of service:   Medicare Important Message given?   (If response is "NO", the following Medicare IM given date fields will be blank) Date Medicare IM given:   Date Additional Medicare IM given:    Discharge Disposition:  HOME W HOME HEALTH SERVICES  Per UR Regulation:  Reviewed for med. necessity/level of care/duration of stay  If discussed at Long Length of Stay Meetings, dates discussed:   08/11/2012    Comments:  4/17 0857 debbie Teriana Danker rn,bsn pt for dc to [facility]. alerted mary w [facility] that hhrn to draw labs. [facility] phy [facility] at [facility].  4/16 1555 debbie Brooklyn Alfredo rn,bsn spoke w [facility] rep today. pt was getting phy ther pta from [facility] and hhrn from [facility]. alerted [facility] of adm. sw following for alf vs snf. prob back to alf w [facility] again.  4/11 1306 debbie Arlis Yale rn,bsn chart states has hhrn thru [facility].

## 2012-08-05 NOTE — Progress Notes (Signed)
Courtesy note: Have reviewed outside records and notes from this hospitalization. I am struck by her lack of insight into her medical condition.  Now for IV diuresis secondary to9 massive fluid overload and right heart failure. Also with need for wound care left ankle with h/o PVD.  Will follow socially.  Thanks

## 2012-08-06 LAB — BASIC METABOLIC PANEL
BUN: 19 mg/dL (ref 6–23)
GFR calc Af Amer: 45 mL/min — ABNORMAL LOW (ref >90)
GFR calc non Af Amer: 39 mL/min — ABNORMAL LOW (ref >90)
Potassium: 3.8 mEq/L (ref 3.5–5.1)
Sodium: 138 mEq/L (ref 135–145)

## 2012-08-06 LAB — CARBOXYHEMOGLOBIN: Methemoglobin: 0.7 % (ref 0.0–1.5)

## 2012-08-06 LAB — PROTIME-INR
INR: 3.14 — ABNORMAL HIGH (ref 0.00–1.49)
Prothrombin Time: 30.6 seconds — ABNORMAL HIGH (ref 11.6–15.2)

## 2012-08-06 LAB — GLUCOSE, CAPILLARY: Glucose-Capillary: 91 mg/dL (ref 70–99)

## 2012-08-06 MED ORDER — POTASSIUM CHLORIDE CRYS ER 20 MEQ PO TBCR
40.0000 meq | EXTENDED_RELEASE_TABLET | Freq: Once | ORAL | Status: AC
  Administered 2012-08-06: 40 meq via ORAL
  Filled 2012-08-06: qty 2

## 2012-08-06 MED ORDER — LEVOTHYROXINE SODIUM 50 MCG PO TABS
50.0000 ug | ORAL_TABLET | Freq: Every day | ORAL | Status: DC
  Administered 2012-08-07 – 2012-08-11 (×5): 50 ug via ORAL
  Filled 2012-08-06 (×6): qty 1

## 2012-08-06 MED ORDER — OXYCODONE HCL 10 MG PO TB12: 10.0000 mg | ORAL_TABLET | Freq: Two times a day (BID) | ORAL | Status: DC

## 2012-08-06 NOTE — Evaluation (Signed)
Physical Therapy Evaluation Patient Details Name: Kristina Diaz MRN: [id number] DOB: 08-30-1929 Today's Date: 08/06/2012 Time: 0865-7846 PT Time Calculation (min): 13 min  PT Assessment / Plan / Recommendation Clinical Impression  Pt. was admitted with a fib, acute CHF right sided, massive volume overload, restrictive cardiomyopathy.  LEs excessively large and out of proportion to her upper body which creates difficulty for her in completing bed mobility.  she reports she is primarily at  transfers and [facility] level and does have to call for assist from nursing tech at times due to her inability to manage her lower body.  She will benefit from acute PT , and may need ST SNF before returning to [facility], depending on her degree of progress.      PT Assessment  Patient needs continued PT services    Follow Up Recommendations  SNF;Supervision/Assistance - 24 hour;Other (comment) (unless she shows great progress)    Does the patient have the potential to tolerate intense rehabilitation      Barriers to Discharge Decreased caregiver support;Other (comment) (ALFmay not be able to meet her needs depending on progress)      Equipment Recommendations  None recommended by PT    Recommendations for Other Services     Frequency Min 3X/week    Precautions / Restrictions Precautions Precautions: Fall Restrictions Weight Bearing Restrictions: No (no WB restriction orders found in chart) LLE Weight Bearing: Non weight bearing   Pertinent Vitals/Pain HR stable in 90s throughout session, no SOB noted, no pain indicated      Mobility  Bed Mobility Bed Mobility: Supine to Sit;Sitting - Scoot to Edge of Bed;Sit to Supine Supine to Sit: 4: Min assist;HOB flat;With rails Sitting - Scoot to Edge of Bed: 4: Min assist;With rail Sit to Supine: 1: +2 Total assist Sit to Supine: Patient Percentage: 60% Details for Bed Mobility Assistance: Pt. unable to bring her LEs back into bed and needed 2 assist  for sit>supine and to scoot to [facility] Transfers Transfers: Not assessed (pt. too weak and fatigued) Ambulation/Gait Ambulation/Gait Assistance: Not tested (comment)    Exercises General Exercises - Lower Extremity Ankle Circles/Pumps: AROM;Both;10 reps   PT Diagnosis: Generalized weakness;Difficulty walking  PT Problem List: Decreased strength;Decreased activity tolerance;Decreased mobility;Decreased knowledge of use of DME;Cardiopulmonary status limiting activity PT Treatment Interventions: DME instruction;Functional mobility training;Therapeutic activities;Therapeutic exercise;Balance training;Patient/family education;Wheelchair mobility training   PT Goals Acute Rehab PT Goals PT Goal Formulation: With patient Time For Goal Achievement: 08/20/12 Potential to Achieve Goals: Fair Pt will go Supine/Side to Sit: with modified independence PT Goal: Supine/Side to Sit - Progress: Goal set today Pt will go Sit to Supine/Side: with min assist PT Goal: Sit to Supine/Side - Progress: Goal set today Pt will go Sit to Stand: with supervision PT Goal: Sit to Stand - Progress: Goal set today Pt will go Stand to Sit: with supervision PT Goal: Stand to Sit - Progress: Goal set today Pt will Transfer Bed to Chair/Chair to Bed: with supervision PT Transfer Goal: Bed to Chair/Chair to Bed - Progress: Goal set today Pt will Propel Wheelchair: 10 - 50 feet;with supervision PT Goal: Propel Wheelchair - Progress: Goal set today  Visit Information  Last PT Received On: 08/06/12 Assistance Needed: +1 (+2 will be needed to attempt transfers)    Subjective Data  Subjective: "I am tired of all this" (referring to her illnesses) Patient Stated Goal: return to [facility] with her husband   Prior Functioning  Home Living Lives With: Spouse  Available Help at Discharge: Available PRN/intermittently Type of Home: Assisted living Home Adaptive Equipment: Wheelchair - manual;Walker - rolling;Hospital  bed Prior Function Level of Independence: Needs assistance Needs Assistance: Toileting;Transfers Toileting: Minimal Transfer Assistance: min assist from husband for toilet transfers; she reports she has to call the nurse tech to help her lift legs back into bed Able to Take Stairs?: No Driving: No Vocation: Retired Musician: No difficulties    Copywriter, advertising Overall Cognitive Status: Appears within functional limits for tasks assessed/performed Arousal/Alertness: Awake/alert Orientation Level: Appears intact for tasks assessed Behavior During Session: [facility] for tasks performed Cognition - Other Comments: pt. tearful at end of session, stating she is tired of her illnesses    Extremity/Trunk Assessment Right Upper Extremity Assessment RUE ROM/Strength/Tone: WFL for tasks assessed RUE Sensation: WFL - Light Touch Left Upper Extremity Assessment LUE ROM/Strength/Tone: WFL for tasks assessed LUE Sensation: WFL - Light Touch Right Lower Extremity Assessment RLE ROM/Strength/Tone: Deficits RLE ROM/Strength/Tone Deficits: strength grossly 3+/5 range, AROM WFL RLE Sensation: WFL - Light Touch Left Lower Extremity Assessment LLE ROM/Strength/Tone: Deficits LLE ROM/Strength/Tone Deficits: strength grossly 3/5 except at ankle, 2/5 and ankle DF (AA) -30 degrees LLE Sensation: WFL - Light Touch   Balance Balance Balance Assessed: Yes Static Sitting Balance Static Sitting - Balance Support: No upper extremity supported;Feet supported Static Sitting - Level of Assistance: 5: Stand by assistance  End of Session PT - End of Session Activity Tolerance: Patient limited by fatigue Patient left: in bed;with call bell/phone within reach Nurse Communication: Mobility status  GP     Ferman Hamming 08/06/2012, 10:14 AM Weldon Picking PT Acute Rehab Services [phone] Bee[place] [phone]

## 2012-08-06 NOTE — Progress Notes (Addendum)
Advanced Heart Failure Rounding Note   Subjective:    Kristina Diaz is a 79 y.o. female who returns for f/u on CHF and AFib.   She recently established with Dr. Olga Millers. She moved here from [place] recently. She has a hx of AFib and LE edema, HTN, hypothyroidism. She was initially not placed on anticoagulation due to fall risk. She was rate controlled and eventually placed on Xarelto 15 (low GFR). She declined ischemic evaluation or admission to the hospital. She was ultimately set up for TEE-DCCV in 03/2012 which was unsuccessful after multiple attempts. F/u echo demonstrated improved EF at 55%. She has h/o acute thrombus to LLE with subsequent thrombectomy.  Echo done here 07/28/12: EF 55%, normal wall motion, restrictive physiology indicative of decreased LV diastolic compliance and/or increased LA pressure, D-shaped interventricular septum suggesting RV pressure/volume overload, mild AI, mild MR, moderate to severe LAE, mild to moderate RVE, mild to moderate reduced RV systolic function, severe RAE, severe TR, PASP 42, possible pleural effusion and trivial pericardial effusion.    Admitted from Dr Waunita Schooner office with massive volume overload/RHF. Admit weight 203. Diuresis sluggish initally but much improved on Lasix drip at 15 mg per hour.   Down 5L yesterday (13 pounds). Edema improving but CVP still 20. Co-ox 67% Denies SOB/PND/Orthopnea  TSH 35.7. Synthroid started.       Objective:   Weight Range:  Vital Signs:   Temp:  [98 F (36.7 C)-98.8 F (37.1 C)] 98 F (36.7 C) (04/12 0800) Pulse Rate:  [63-73] 71 (04/12 0314) Resp:  [11-18] 11 (04/12 0800) BP: (93-119)/(39-56) 119/43 mmHg (04/12 0800) SpO2:  [94 %-99 %] 95 % (04/12 0800) Weight:  [87.7 kg (193 lb 5.5 oz)-93.5 kg (206 lb 2.1 oz)] 87.7 kg (193 lb 5.5 oz) (04/12 0314) Last BM Date: 08/03/12  Weight change: Filed Weights   08/05/12 0615 08/05/12 1300 08/06/12 0314  Weight: 91.1 kg (200 lb 13.4 oz) 93.5 kg  (206 lb 2.1 oz) 87.7 kg (193 lb 5.5 oz)    Intake/Output:   Intake/Output Summary (Last 24 hours) at 08/06/12 1009 Last data filed at 08/06/12 0800  Gross per 24 hour  Intake   2211 ml  Output   7475 ml  Net  -5264 ml     Physical Exam: General:  Chronically ill appearing. No resp difficulty HEENT: normal Neck: supple. JVP to jaw  . Carotids 2+ bilat; no bruits. No lymphadenopathy or thryomegaly appreciated. Cor: PMI nondisplaced. Irregular rate & rhythm. No rubs, gallops or murmurs. Lungs: clear Abdomen: soft, nontender, nondistended. No hepatosplenomegaly. No bruits or masses. Good bowel sounds. Extremities: no cyanosis, clubbing, rash, R and LLE 3-4+ edema Neuro: alert & orientedx3, cranial nerves grossly intact. moves all 4 extremities w/o difficulty. Affect pleasant  Telemetry: Afib  Labs: Basic Metabolic Panel:  Recent Labs Lab 08/04/12 1414 08/05/12 0555 08/06/12 0340  NA 140 138 138  K 3.2* 3.8 3.8  CL 101 102 97  CO2 32 31 35*  GLUCOSE 109* 84 96  BUN 17 16 19   CREATININE 1.13* 1.13* 1.26*  CALCIUM 8.8 8.7 9.0    Liver Function Tests:  Recent Labs Lab 08/04/12 1414  AST 38*  ALT 17  ALKPHOS 126*  BILITOT 1.4*  PROT 7.2  ALBUMIN 2.7*   No results found for this basename: LIPASE, AMYLASE,  in the last 168 hours No results found for this basename: AMMONIA,  in the last 168 hours  CBC:  Recent Labs Lab 08/04/12 1414  WBC 4.1  NEUTROABS 2.6  HGB 9.5*  HCT 29.1*  MCV 92.1  PLT 134*    Cardiac Enzymes:  Recent Labs Lab 08/04/12 1415 08/04/12 1717 08/04/12 2335  TROPONINI <0.30 <0.30 <0.30    BNP: BNP (last 3 results)  Recent Labs  07/26/12 1232 08/04/12 1415  PROBNP 641.0* 1382.0*     Other results:  EKG:   Imaging: Dg Chest Port 1 View  08/05/2012  *RADIOLOGY REPORT*  Clinical Data: Bedside PICC placement.  PORTABLE CHEST - 1 VIEW 08/05/2012 1558 hours:  Comparison: Portable chest x-ray earlier same date 0524 hours  and portable chest x-ray 06/23/2012.  Findings: Left arm PICC tip projects over the lower SVC.  Cardiac silhouette enlarged but stable.  Stable bilateral pleural effusions, larger on the left and small to moderate sized on the right.  Stable consolidation in the left lower lobe.  No new pulmonary parenchymal abnormalities.  IMPRESSION:  1.  Left arm PICC tip projects over the lower SVC. 2.  Stable bilateral pleural effusions, left greater than right. 3.  Stable left lower lobe atelectasis and/or pneumonia.   Original Report Authenticated By: Hulan Saas, M.D.    Dg Chest Port 1 View  08/05/2012  *RADIOLOGY REPORT*  Clinical Data: Congestive heart failure.  PORTABLE CHEST - 1 VIEW  Comparison: 06/23/2012.  Findings: Trachea is midline.  Heart is large.  Biapical pleural parenchymal scarring.  Small to moderate left pleural effusion. Associated left basilar airspace disease.  Small right pleural effusion.  IMPRESSION:  1.  Small to moderate left pleural effusion with left basilar air space disease. 2.  Small right pleural effusion.   Original Report Authenticated By: Leanna Battles, M.D.      Medications:     Scheduled Medications: . levothyroxine  37.5 mcg Oral QAC breakfast  . metolazone  2.5 mg Oral Daily  . metoprolol succinate  25 mg Oral Daily  . multivitamin with minerals  1 tablet Oral q morning - 10a  . nystatin   Topical QID  . OxyCODONE  10 mg Oral Q12H  . potassium chloride  20 mEq Oral BID  . sodium chloride  10-40 mL Intracatheter Q12H  . sodium chloride  3 mL Intravenous Q12H  . Warfarin - Pharmacist Dosing Inpatient   Does not apply q1800    Infusions: . furosemide (LASIX) infusion 10 mg/hr (08/05/12 2000)    PRN Medications: sodium chloride, acetaminophen, ondansetron (ZOFRAN) IV, oxyCODONE, sodium chloride, sodium chloride   Assessment:  1. A/C diastolic heart failure 2. Restrictive Cardiomyopathy 3. A Fib 4. Deconditioning 6. Malnutrition - Albumin 2.7 7.  Hypokalemia 8. L 4 fractrue 9. H/o embolic phenomenon to LLE s/p thrombectomy 10. Acute kidney injury 11. Hypokalemia  Plan/Discussion:    Brisk diuresis on IV lasix but is still massively volume overloaded. Creatinine up slowly so will cut lasix gtt back to 10. Co-ox looks good. Supp K+  TSH elevated. T4 low. Increase synthroid to 50.  Continue coumadin for AF. May eventually switch to Eliquis if she can afford.   Consult PT. Consult SW- return to assisted living versus SNF.   Length of Stay: 2  Holly Bodily 08/06/2012, 10:09 AM   Advanced Heart Failure Team Pager [phone] (7a - 4p)  Please contact [facility] for night-coverage after hours (4p -7a ) on [email or web address]

## 2012-08-06 NOTE — Progress Notes (Signed)
ANTICOAGULATION CONSULT NOTE - Initial Consult  Pharmacy Consult for Coumadin  Indication: atrial fibrillation  No Known Allergies  Patient Measurements: Height: 5\' 6"  (167.6 cm) Weight: 193 lb 5.5 oz (87.7 kg) IBW/kg (Calculated) : 59.3   Vital Signs: Temp: 98 F (36.7 C) (04/12 0800) Temp src: Oral (04/12 0800) BP: 110/49 mmHg (04/12 0314) Pulse Rate: 71 (04/12 0314)  Labs:  Recent Labs  08/04/12 1414 08/04/12 1415 08/04/12 1717 08/04/12 2335 08/05/12 0555 08/06/12 0340  HGB 9.5*  --   --   --   --   --   HCT 29.1*  --   --   --   --   --   PLT 134*  --   --   --   --   --   APTT 78*  --   --   --   --   --   LABPROT 41.3*  --   --   --  36.2* 30.6*  INR 4.71*  --   --   --  3.94* 3.14*  CREATININE 1.13*  --   --   --  1.13* 1.26*  TROPONINI  --  <0.30 <0.30 <0.30  --   --     Estimated Creatinine Clearance: 39.7 ml/min (by C-G formula based on Cr of 1.26).   Medical History: Past Medical History  Diagnosis Date  . PAD (peripheral artery disease) 2013    left leg. sounds like she had angio-plasty   . Atrial fibrillation 2013    s/p [facility] that failed. ON medication  . Hypertension   . Osteoporosis   . DVT (deep venous thrombosis) 2013    "LLE" (08/04/2012)  . Hypothyroidism   . History of blood transfusion 2013    "related to thrombectomy" (08/04/2012)  . Anemia     "slightly" (08/04/2012)  . Arthritis     "probably minor; knees, pinky" (08/04/2012)  . Fracture of L4 vertebra     "dx'd in 2013; can't treat til legs fixed" (08/04/2012)      Assessment: Kristina Diaz recently moved here from [place], [place].  She has recent hx of LEE with minimal improvement with po furosemide as outpt.  She is admitted with SOB and LEE.  She has Hx A-Fib and is anticoagulated with Coumadin.  INR on admit 4.7> 3.14 today, her last dose was 4/8.  Her INR was elevated 4/9 at ALF and Coumadin dose held 4/9 per ALF.  No bleeding noted.  CBC low stable on admit.    Goal of Therapy:   INR 2-3 Monitor platelets by anticoagulation protocol: Yes   Plan:  Continue to hold coumadin for now, may be able to resume tomorrow. Daily INR  Reece Leader, Loura Back, BCPS  Clinical Pharmacist Pager [phone]  08/06/2012 8:41 AM

## 2012-08-06 NOTE — Plan of Care (Signed)
Problem: Phase II Progression Outcomes Goal: Walk in hall or up in chair TID Outcome: Not Applicable Date Met:  08/06/12 Pt. Largely non-ambulatory PTA

## 2012-08-07 DIAGNOSIS — I5033 Acute on chronic diastolic (congestive) heart failure: Principal | ICD-10-CM

## 2012-08-07 DIAGNOSIS — E039 Hypothyroidism, unspecified: Secondary | ICD-10-CM

## 2012-08-07 LAB — PROTIME-INR
INR: 2.54 — ABNORMAL HIGH (ref 0.00–1.49)
Prothrombin Time: 26.1 seconds — ABNORMAL HIGH (ref 11.6–15.2)

## 2012-08-07 LAB — BASIC METABOLIC PANEL
Chloride: 93 mEq/L — ABNORMAL LOW (ref 96–112)
Creatinine, Ser: 1.27 mg/dL — ABNORMAL HIGH (ref 0.50–1.10)
GFR calc Af Amer: 45 mL/min — ABNORMAL LOW (ref >90)
Potassium: 3.6 mEq/L (ref 3.5–5.1)
Sodium: 138 mEq/L (ref 135–145)

## 2012-08-07 LAB — CARBOXYHEMOGLOBIN: O2 Saturation: 74.1 %

## 2012-08-07 MED ORDER — ACETAZOLAMIDE ER 500 MG PO CP12
500.0000 mg | ORAL_CAPSULE | Freq: Two times a day (BID) | ORAL | Status: DC
  Administered 2012-08-07 – 2012-08-10 (×8): 500 mg via ORAL
  Filled 2012-08-07 (×11): qty 1

## 2012-08-07 MED ORDER — WARFARIN SODIUM 3 MG PO TABS
3.0000 mg | ORAL_TABLET | Freq: Once | ORAL | Status: AC
  Administered 2012-08-07: 3 mg via ORAL
  Filled 2012-08-07: qty 1

## 2012-08-07 NOTE — Progress Notes (Signed)
ANTICOAGULATION CONSULT NOTE - Follow-up Consult  Pharmacy Consult for Coumadin  Indication: atrial fibrillation  No Known Allergies  Patient Measurements: Height: 5\' 6"  (167.6 cm) Weight: 177 lb 14.6 oz (80.7 kg) IBW/kg (Calculated) : 59.3   Vital Signs: Temp: 97.9 F (36.6 C) (04/13 0833) Temp src: Oral (04/13 0833) BP: 108/42 mmHg (04/13 0433)  Labs:  Recent Labs  08/04/12 1414 08/04/12 1415 08/04/12 1717 08/04/12 2335 08/05/12 0555 08/06/12 0340 08/07/12 0520  HGB 9.5*  --   --   --   --   --   --   HCT 29.1*  --   --   --   --   --   --   PLT 134*  --   --   --   --   --   --   APTT 78*  --   --   --   --   --   --   LABPROT 41.3*  --   --   --  36.2* 30.6* 26.1*  INR 4.71*  --   --   --  3.94* 3.14* 2.54*  CREATININE 1.13*  --   --   --  1.13* 1.26* 1.27*  TROPONINI  --  <0.30 <0.30 <0.30  --   --   --     Estimated Creatinine Clearance: 37.9 ml/min (by C-G formula based on Cr of 1.27).   Medical History: Past Medical History  Diagnosis Date  . PAD (peripheral artery disease) 2013    left leg. sounds like she had angio-plasty   . Atrial fibrillation 2013    s/p [facility] that failed. ON medication  . Hypertension   . Osteoporosis   . DVT (deep venous thrombosis) 2013    "LLE" (08/04/2012)  . Hypothyroidism   . History of blood transfusion 2013    "related to thrombectomy" (08/04/2012)  . Anemia     "slightly" (08/04/2012)  . Arthritis     "probably minor; knees, pinky" (08/04/2012)  . Fracture of L4 vertebra     "dx'd in 2013; can't treat til legs fixed" (08/04/2012)      Assessment: 82yof recently moved here from [place], [place].  She has recent hx of LEE with minimal improvement with po furosemide as outpt.  She is admitted with SOB and LEE.  She has Hx A-Fib and is anticoagulated with Coumadin.  INR on admit 4.7> 2.54 today, her last dose was 4/8.  Her INR was elevated 4/9 at ALF and Coumadin dose held 4/9 per ALF.  No bleeding noted per chart notes.   CBC low stable on admit (not rechecked).    Goal of Therapy:  INR 2-3 Monitor platelets by anticoagulation protocol: Yes   Plan:  1. Coumadin 3 mg x 1.  May need reduction of her outpatient Coumadin dose given elevated INR at admission. 2. Daily INR.  Tad Moore, BCPS  Clinical Pharmacist Pager [phone]  08/07/2012 11:19 AM

## 2012-08-07 NOTE — Progress Notes (Addendum)
Advanced Heart Failure Rounding Note   Subjective:    Kristina Diaz is a 79 y.o. female who returns for f/u on CHF and AFib.   She recently established with Dr. Olga Millers. She moved here from [place] recently. She has a hx of AFib and LE edema, HTN, hypothyroidism. She was initially not placed on anticoagulation due to fall risk. She was rate controlled and eventually placed on Xarelto 15 (low GFR). She declined ischemic evaluation or admission to the hospital. She was ultimately set up for TEE-DCCV in 03/2012 which was unsuccessful after multiple attempts. F/u echo demonstrated improved EF at 55%. She has h/o acute thrombus to LLE with subsequent thrombectomy.  Echo done here 07/28/12: EF 55%, normal wall motion, restrictive physiology indicative of decreased LV diastolic compliance and/or increased LA pressure, D-shaped interventricular septum suggesting RV pressure/volume overload, mild AI, mild MR, moderate to severe LAE, mild to moderate RVE, mild to moderate reduced RV systolic function, severe RAE, severe TR, PASP 42, possible pleural effusion and trivial pericardial effusion.   Admitted from Dr Waunita Schooner office with massive volume overload/RHF. Admit weight 203. Diuresis sluggish initally but much improved on Lasix drip at 15 mg per hour. Lasix gtt turned down to 10 yesterday.   Down another 5L. Weight now down 26 pounds total. Edema improving but CVP still 17. Co-ox 74% Denies SOB/PND/Orthopnea. Renal function stable.   TSH 35.7. Synthroid increased yesterday.       Objective:   Weight Range:  Vital Signs:   Temp:  [97.9 F (36.6 C)-98.1 F (36.7 C)] 97.9 F (36.6 C) (04/13 0833) Resp:  [11-18] 15 (04/13 0553) BP: (92-116)/(39-58) 108/42 mmHg (04/13 0433) SpO2:  [92 %-98 %] 93 % (04/13 0833) Weight:  [80.7 kg (177 lb 14.6 oz)] 80.7 kg (177 lb 14.6 oz) (04/13 0553) Last BM Date: 08/06/12  Weight change: Filed Weights   08/05/12 1300 08/06/12 0314 08/07/12 0553  Weight:  93.5 kg (206 lb 2.1 oz) 87.7 kg (193 lb 5.5 oz) 80.7 kg (177 lb 14.6 oz)    Intake/Output:   Intake/Output Summary (Last 24 hours) at 08/07/12 1128 Last data filed at 08/07/12 1000  Gross per 24 hour  Intake   1180 ml  Output   7651 ml  Net  -6471 ml     Physical Exam: General:  Chronically ill appearing. No resp difficulty HEENT: normal Neck: supple. JVP to jaw  . Carotids 2+ bilat; no bruits. No lymphadenopathy or thryomegaly appreciated. Cor: PMI nondisplaced. Irregular rate & rhythm. No rubs, gallops or murmurs. Lungs: clear Abdomen: soft, nontender, nondistended. No hepatosplenomegaly. No bruits or masses. Good bowel sounds. Extremities: no cyanosis, clubbing, rash, R and LLE 2+ edema. Healed wound on LLE Neuro: alert & orientedx3, cranial nerves grossly intact. moves all 4 extremities w/o difficulty. Affect pleasant  Telemetry: Afib  Labs: Basic Metabolic Panel:  Recent Labs Lab 08/04/12 1414 08/05/12 0555 08/06/12 0340 08/07/12 0520  NA 140 138 138 138  K 3.2* 3.8 3.8 3.6  CL 101 102 97 93*  CO2 32 31 35* 41*  GLUCOSE 109* 84 96 125*  BUN 17 16 19 23   CREATININE 1.13* 1.13* 1.26* 1.27*  CALCIUM 8.8 8.7 9.0 9.2    Liver Function Tests:  Recent Labs Lab 08/04/12 1414  AST 38*  ALT 17  ALKPHOS 126*  BILITOT 1.4*  PROT 7.2  ALBUMIN 2.7*   No results found for this basename: LIPASE, AMYLASE,  in the last 168 hours No results found for this  basename: AMMONIA,  in the last 168 hours  CBC:  Recent Labs Lab 08/04/12 1414  WBC 4.1  NEUTROABS 2.6  HGB 9.5*  HCT 29.1*  MCV 92.1  PLT 134*    Cardiac Enzymes:  Recent Labs Lab 08/04/12 1415 08/04/12 1717 08/04/12 2335  TROPONINI <0.30 <0.30 <0.30    BNP: BNP (last 3 results)  Recent Labs  07/26/12 1232 08/04/12 1415  PROBNP 641.0* 1382.0*     Imaging: Dg Chest Port 1 View  08/05/2012  *RADIOLOGY REPORT*  Clinical Data: Bedside PICC placement.  PORTABLE CHEST - 1 VIEW 08/05/2012  1558 hours:  Comparison: Portable chest x-ray earlier same date 0524 hours and portable chest x-ray 06/23/2012.  Findings: Left arm PICC tip projects over the lower SVC.  Cardiac silhouette enlarged but stable.  Stable bilateral pleural effusions, larger on the left and small to moderate sized on the right.  Stable consolidation in the left lower lobe.  No new pulmonary parenchymal abnormalities.  IMPRESSION:  1.  Left arm PICC tip projects over the lower SVC. 2.  Stable bilateral pleural effusions, left greater than right. 3.  Stable left lower lobe atelectasis and/or pneumonia.   Original Report Authenticated By: Hulan Saas, M.D.      Medications:     Scheduled Medications: . levothyroxine  50 mcg Oral QAC breakfast  . metolazone  2.5 mg Oral Daily  . metoprolol succinate  25 mg Oral Daily  . multivitamin with minerals  1 tablet Oral q morning - 10a  . nystatin   Topical QID  . OxyCODONE  10 mg Oral Q12H  . potassium chloride  20 mEq Oral BID  . sodium chloride  10-40 mL Intracatheter Q12H  . sodium chloride  3 mL Intravenous Q12H  . warfarin  3 mg Oral ONCE-1800  . Warfarin - Pharmacist Dosing Inpatient   Does not apply q1800    Infusions: . furosemide (LASIX) infusion 10 mg/hr (08/06/12 1900)    PRN Medications: sodium chloride, acetaminophen, ondansetron (ZOFRAN) IV, oxyCODONE, sodium chloride, sodium chloride   Assessment:  1. A/C diastolic heart failure 2. Restrictive Cardiomyopathy 3. A Fib 4. Deconditioning 6. Malnutrition - Albumin 2.7 7. Hypokalemia 8. L 4 fractrue 9. H/o embolic phenomenon to LLE s/p thrombectomy 10. Acute kidney injury 11. Hypokalemia  Plan/Discussion:    Brisk diuresis continues on IV lasix but is still markedly volume overloaded. Creatinine stable on lasix 10/hr. Co-ox looks good. Supp K+ Start diamox for alkalosis.  Continue coumadin for AF. May eventually switch to Eliquis if she can afford.   Appreciate PT input. Consult SW-  return to assisted living versus SNF.   Length of Stay: 3  Daniel BensimhonMD 08/07/2012, 11:28 AM   Advanced Heart Failure Team Pager [phone] (7a - 4p)  Please contact [facility] for night-coverage after hours (4p -7a ) on [email or web address]

## 2012-08-08 ENCOUNTER — Encounter (HOSPITAL_COMMUNITY): Payer: Medicare Other

## 2012-08-08 ENCOUNTER — Other Ambulatory Visit: Payer: Self-pay

## 2012-08-08 DIAGNOSIS — S81809A Unspecified open wound, unspecified lower leg, initial encounter: Secondary | ICD-10-CM

## 2012-08-08 LAB — PROTIME-INR: INR: 2.09 — ABNORMAL HIGH (ref 0.00–1.49)

## 2012-08-08 LAB — BASIC METABOLIC PANEL
Calcium: 9.9 mg/dL (ref 8.4–10.5)
Chloride: 87 mEq/L — ABNORMAL LOW (ref 96–112)
Creatinine, Ser: 1.39 mg/dL — ABNORMAL HIGH (ref 0.50–1.10)
GFR calc Af Amer: 40 mL/min — ABNORMAL LOW (ref >90)

## 2012-08-08 LAB — CARBOXYHEMOGLOBIN
Carboxyhemoglobin: 2.1 % — ABNORMAL HIGH (ref 0.5–1.5)
Methemoglobin: 0.7 % (ref 0.0–1.5)

## 2012-08-08 MED ORDER — WARFARIN SODIUM 3 MG PO TABS
3.0000 mg | ORAL_TABLET | Freq: Once | ORAL | Status: AC
  Administered 2012-08-08: 3 mg via ORAL
  Filled 2012-08-08: qty 1

## 2012-08-08 MED ORDER — OXYCODONE HCL 10 MG PO TB12: 10.0000 mg | ORAL_TABLET | Freq: Two times a day (BID) | ORAL | Status: DC

## 2012-08-08 MED ORDER — HYDROCORTISONE 1 % EX CREA
TOPICAL_CREAM | Freq: Two times a day (BID) | CUTANEOUS | Status: DC | PRN
  Administered 2012-08-08 – 2012-08-11 (×3): via TOPICAL
  Filled 2012-08-08: qty 28

## 2012-08-08 NOTE — Progress Notes (Addendum)
Advanced Heart Failure Rounding Note   Subjective:    Kristina Diaz is a 79 y.o. female who returns for f/u on CHF and AFib.   She recently established with Dr. Olga Millers. She moved here from [place] recently. She has a hx of AFib and LE edema, HTN, hypothyroidism. She was initially not placed on anticoagulation due to fall risk. She was rate controlled and eventually placed on Xarelto 15 (low GFR). She declined ischemic evaluation or admission to the hospital. She was ultimately set up for TEE-DCCV in 03/2012 which was unsuccessful after multiple attempts. F/u echo demonstrated improved EF at 55%. She has h/o acute thrombus to LLE with subsequent thrombectomy.  Echo done here 07/28/12: EF 55%, normal wall motion, restrictive physiology indicative of decreased LV diastolic compliance and/or increased LA pressure, D-shaped interventricular septum suggesting RV pressure/volume overload, mild AI, mild MR, moderate to severe LAE, mild to moderate RVE, mild to moderate reduced RV systolic function, severe RAE, severe TR, PASP 42, possible pleural effusion and trivial pericardial effusion.   Admitted from Dr Waunita Schooner office with massive volume overload/RHF. Admit weight 203. Diuresis sluggish initally but much improved on Lasix drip at 15 mg per hour. Lasix gtt turned down to 10 yesterday.   Yesterday she was started on Diamox and continued on lasix drip at 10 mg per hour. Down another 8L. Weight now down 35 pounds total. Edema improving but CVP still 20. Denies SOB/PND/Orthopnea. Labs pending.      Objective:   Weight Range:  Vital Signs:   Temp:  [97.8 F (36.6 C)-98.6 F (37 C)] 97.9 F (36.6 C) (04/14 0745) Pulse Rate:  [66] 66 (04/14 0745) Resp:  [12-15] 12 (04/14 0200) BP: (91-121)/(42-51) 91/43 mmHg (04/14 0745) SpO2:  [92 %-96 %] 93 % (04/14 0745) Weight:  [76.3 kg (168 lb 3.4 oz)] 76.3 kg (168 lb 3.4 oz) (04/14 0457) Last BM Date: 08/06/12  Weight change: Filed Weights    08/06/12 0314 08/07/12 0553 08/08/12 0457  Weight: 87.7 kg (193 lb 5.5 oz) 80.7 kg (177 lb 14.6 oz) 76.3 kg (168 lb 3.4 oz)    Intake/Output:   Intake/Output Summary (Last 24 hours) at 08/08/12 0840 Last data filed at 08/08/12 0700  Gross per 24 hour  Intake   1130 ml  Output   9200 ml  Net  -8070 ml     Physical Exam: CVP 20 General:  Chronically ill appearing. No resp difficulty HEENT: normal Neck: supple. JVP to jaw. Carotids 2+ bilat; no bruits. No lymphadenopathy or thryomegaly appreciated. Cor: PMI nondisplaced. Irregular rate & rhythm. No rubs, gallops or murmurs. Lungs: clear Abdomen: soft, nontender, nondistended. No hepatosplenomegaly. No bruits or masses. Good bowel sounds. Extremities: no cyanosis, clubbing, rash, R and LLE 1+ edema. LLE with foul-swelling wound with clean base. Neuro: alert & orientedx3, cranial nerves grossly intact. moves all 4 extremities w/o difficulty. Affect pleasant  Telemetry: Afib 60  Labs: Basic Metabolic Panel:  Recent Labs Lab 08/04/12 1414 08/05/12 0555 08/06/12 0340 08/07/12 0520  NA 140 138 138 138  K 3.2* 3.8 3.8 3.6  CL 101 102 97 93*  CO2 32 31 35* 41*  GLUCOSE 109* 84 96 125*  BUN 17 16 19 23   CREATININE 1.13* 1.13* 1.26* 1.27*  CALCIUM 8.8 8.7 9.0 9.2    Liver Function Tests:  Recent Labs Lab 08/04/12 1414  AST 38*  ALT 17  ALKPHOS 126*  BILITOT 1.4*  PROT 7.2  ALBUMIN 2.7*   No results found  for this basename: LIPASE, AMYLASE,  in the last 168 hours No results found for this basename: AMMONIA,  in the last 168 hours  CBC:  Recent Labs Lab 08/04/12 1414  WBC 4.1  NEUTROABS 2.6  HGB 9.5*  HCT 29.1*  MCV 92.1  PLT 134*    Cardiac Enzymes:  Recent Labs Lab 08/04/12 1415 08/04/12 1717 08/04/12 2335  TROPONINI <0.30 <0.30 <0.30    BNP: BNP (last 3 results)  Recent Labs  07/26/12 1232 08/04/12 1415  PROBNP 641.0* 1382.0*     Imaging: No results found.   Medications:      Scheduled Medications: . acetaZOLAMIDE  500 mg Oral Q12H  . levothyroxine  50 mcg Oral QAC breakfast  . metolazone  2.5 mg Oral Daily  . metoprolol succinate  25 mg Oral Daily  . multivitamin with minerals  1 tablet Oral q morning - 10a  . nystatin   Topical QID  . OxyCODONE  10 mg Oral Q12H  . potassium chloride  20 mEq Oral BID  . sodium chloride  10-40 mL Intracatheter Q12H  . sodium chloride  3 mL Intravenous Q12H  . Warfarin - Pharmacist Dosing Inpatient   Does not apply q1800    Infusions: . furosemide (LASIX) infusion 10 mg/hr (08/07/12 1716)    PRN Medications: sodium chloride, acetaminophen, ondansetron (ZOFRAN) IV, oxyCODONE, sodium chloride, sodium chloride   Assessment:  1. A/C diastolic heart failure 2. Restrictive Cardiomyopathy 3. A Fib 4. Deconditioning 6. Malnutrition - Albumin 2.7 7. Hypokalemia 8. L 4 fractrue 9. H/o embolic phenomenon to LLE s/p thrombectomy 10. Acute kidney injury 11. Hypokalemia  Plan/Discussion:    Brisk diuresis continues on IV lasix but is still markedly volume overloaded. CVP 20. Continue lasix drip 10 mg per hour. Continue diamox for alkalosis. Labs pending.  Wound irrigated at bedside by Tonye Becket, NP-C. Will have wound care see. Will likely need wound culture and abx given odor.  Continue coumadin for AF. May eventually switch to Eliquis if she can afford.   Appreciate PT input. Consult SW- return to assisted living versus SNF.   Length of Stay: 4   Advanced Heart Failure Team Pager [phone] (7a - 4p)  Please contact [facility] for night-coverage after hours (4p -7a ) on [email or web address] Daniel Bensimhon,MD 8:43 AM

## 2012-08-08 NOTE — Progress Notes (Signed)
Physical Therapy Treatment Patient Details Name: Kristina Diaz MRN: [id number] DOB: 02-16-1930 Today's Date: 08/08/2012 Time: 0347-4259 PT Time Calculation (min): 23 min  PT Assessment / Plan / Recommendation Comments on Treatment Session  Pt pleasant & willing to participate.   Progressed with mobility as being able to transfer bed>recliner however still unsure of how much (A) [facility] can provide at ALF as far as being able to transfer bed>w/c.  Pt reports husband was assisting her with transfers PTA.      Follow Up Recommendations  SNF;Supervision/Assistance - 24 hour (unless she shows great progress)     Does the patient have the potential to tolerate intense rehabilitation     Barriers to Discharge        Equipment Recommendations  None recommended by PT    Recommendations for Other Services    Frequency Min 3X/week   Plan Discharge plan remains appropriate    Precautions / Restrictions Precautions Precautions: Fall Restrictions Weight Bearing Restrictions: No (No WB restrictions orders found in chart) LLE Weight Bearing: Non weight bearing       Mobility  Bed Mobility Bed Mobility: Supine to Sit;Sitting - Scoot to Edge of Bed Supine to Sit: 4: Min assist Sitting - Scoot to [place] of Bed: 4: Min assist Details for Bed Mobility Assistance: Minor (A) to achieve sitting upright & use of draw pad to pivot hips around to EOB & scoot closer to EOB.   Transfers Transfers: Sit to Stand;Stand to Sit;Stand Pivot Transfers Sit to Stand: 1: +2 Total assist;With upper extremity assist;From bed Sit to Stand: Patient Percentage: 70% Stand to Sit: 1: +2 Total assist;With upper extremity assist;With armrests;To chair/3-in-1 Stand to Sit: Patient Percentage: 80% Stand Pivot Transfers: 1: +2 Total assist Stand Pivot Transfers: Patient Percentage: 70% Details for Transfer Assistance: Cues for hand placement & technique.  (A) to achieve standing, balance, & controlled descent.   Pt able to take  pivotal steps around to recliner & used bil UE's to help control descent with stand>sit.   Ambulation/Gait Ambulation/Gait Assistance: Not tested (comment)          PT Goals Acute Rehab PT Goals Time For Goal Achievement: 08/20/12 Potential to Achieve Goals: Fair Pt will go Supine/Side to Sit: with modified independence PT Goal: Supine/Side to Sit - Progress: Progressing toward goal Pt will go Sit to Supine/Side: with min assist Pt will go Sit to Stand: with supervision PT Goal: Sit to Stand - Progress: Progressing toward goal Pt will go Stand to Sit: with supervision PT Goal: Stand to Sit - Progress: Progressing toward goal Pt will Transfer Bed to Chair/Chair to Bed: with supervision PT Transfer Goal: Bed to Chair/Chair to Bed - Progress: Progressing toward goal Pt will Propel Wheelchair: 10 - 50 feet;with supervision  Visit Information  Last PT Received On: 08/08/12 Assistance Needed: +2 safety    Subjective Data  Patient Stated Goal: return to [facility] with her husband   Cognition  Cognition Overall Cognitive Status: Appears within functional limits for tasks assessed/performed Arousal/Alertness: Awake/alert Orientation Level: Appears intact for tasks assessed Behavior During Session: [facility] for tasks performed    Balance  Static Standing Balance Static Standing - Balance Support: Bilateral upper extremity supported Static Standing - Level of Assistance: 1: +2 Total assist (Pt 70%) Static Standing - Comment/# of Minutes: Pt stood EOB x 2 mins before pivoting around to recliner.    End of Session       Verdell Face, [place] [phone] 08/08/2012

## 2012-08-08 NOTE — Telephone Encounter (Signed)
Waiting for MD signature then will fax

## 2012-08-08 NOTE — Telephone Encounter (Signed)
Oxycontin 10 mg script faxed to [facility] at [phone].

## 2012-08-08 NOTE — Progress Notes (Signed)
ANTICOAGULATION CONSULT NOTE - Follow-up Consult  Pharmacy Consult for Coumadin  Indication: atrial fibrillation  No Known Allergies  Patient Measurements: Height: 5\' 6"  (167.6 cm) Weight: 168 lb 3.4 oz (76.3 kg) IBW/kg (Calculated) : 59.3   Vital Signs: Temp: 97.9 F (36.6 C) (04/14 0745) Temp src: Oral (04/14 0745) BP: 91/43 mmHg (04/14 0745) Pulse Rate: 66 (04/14 0745)  Labs:  Recent Labs  08/06/12 0340 08/07/12 0520 08/08/12 0500  LABPROT 30.6* 26.1* 22.6*  INR 3.14* 2.54* 2.09*  CREATININE 1.26* 1.27*  --     Estimated Creatinine Clearance: 36.9 ml/min (by C-G formula based on Cr of 1.27).   Medical History: Past Medical History  Diagnosis Date  . PAD (peripheral artery disease) 2013    left leg. sounds like she had angio-plasty   . Atrial fibrillation 2013    s/p [facility] that failed. ON medication  . Hypertension   . Osteoporosis   . DVT (deep venous thrombosis) 2013    "LLE" (08/04/2012)  . Hypothyroidism   . History of blood transfusion 2013    "related to thrombectomy" (08/04/2012)  . Anemia     "slightly" (08/04/2012)  . Arthritis     "probably minor; knees, pinky" (08/04/2012)  . Fracture of L4 vertebra     "dx'd in 2013; can't treat til legs fixed" (08/04/2012)      Assessment: 82yof recently moved here from [place], [place].  She has recent hx of LEE with minimal improvement with po furosemide as outpt.  She admitted 08/04/2012   with SOB and LEE.  She has Hx A-Fib and is anticoagulated with Coumadin.  INR on admit 4.7.  PTA regimen: 3 mg daily, INR was elevated 4/9 at ALF and Coumadin dose held 4/9 per ALF.   INR at goal, trend continues down after restart warfarin 4/13   Goal of Therapy:  INR 2-3 Monitor platelets by anticoagulation protocol: Yes   Plan:  1. Coumadin 3 mg x 1.  May need reduction of her outpatient Coumadin dose given elevated INR at admission, but INR trend down so will continue with 3 mg today 2. Daily INR.   Thank you for  allowing pharmacy to be a part of this patients care team.  Lovenia Kim Pharm.D., BCPS Clinical Pharmacist 08/08/2012 8:42 AM Pager: [phone] Phone: [phone]

## 2012-08-09 ENCOUNTER — Encounter (HOSPITAL_COMMUNITY): Payer: Medicare Other

## 2012-08-09 DIAGNOSIS — L98499 Non-pressure chronic ulcer of skin of other sites with unspecified severity: Secondary | ICD-10-CM

## 2012-08-09 LAB — PROTIME-INR: INR: 2.06 — ABNORMAL HIGH (ref 0.00–1.49)

## 2012-08-09 LAB — BASIC METABOLIC PANEL
BUN: 32 mg/dL — ABNORMAL HIGH (ref 6–23)
CO2: 44 mEq/L (ref 19–32)
Calcium: 9.5 mg/dL (ref 8.4–10.5)
Chloride: 85 mEq/L — ABNORMAL LOW (ref 96–112)
Creatinine, Ser: 1.51 mg/dL — ABNORMAL HIGH (ref 0.50–1.10)
Creatinine, Ser: 1.44 mg/dL — ABNORMAL HIGH (ref 0.50–1.10)
GFR calc Af Amer: 39 mL/min — ABNORMAL LOW (ref >90)
Glucose, Bld: 102 mg/dL — ABNORMAL HIGH (ref 70–99)
Potassium: 4.7 mEq/L (ref 3.5–5.1)

## 2012-08-09 LAB — CARBOXYHEMOGLOBIN
Carboxyhemoglobin: 1.9 % — ABNORMAL HIGH (ref 0.5–1.5)
Methemoglobin: 0.9 % (ref 0.0–1.5)
Total hemoglobin: 10 g/dL — ABNORMAL LOW (ref 12.0–16.0)

## 2012-08-09 MED ORDER — WARFARIN SODIUM 3 MG PO TABS
3.0000 mg | ORAL_TABLET | Freq: Every day | ORAL | Status: DC
  Administered 2012-08-09 – 2012-08-10 (×2): 3 mg via ORAL
  Filled 2012-08-09 (×3): qty 1

## 2012-08-09 MED ORDER — POTASSIUM CHLORIDE 10 MEQ/50ML IV SOLN
10.0000 meq | INTRAVENOUS | Status: AC
  Administered 2012-08-09 (×4): 10 meq via INTRAVENOUS
  Filled 2012-08-09: qty 200

## 2012-08-09 MED ORDER — BARRIER CREAM NON-SPECIFIED
1.0000 "application " | TOPICAL_CREAM | Freq: Two times a day (BID) | TOPICAL | Status: DC | PRN
  Filled 2012-08-09: qty 1

## 2012-08-09 NOTE — Progress Notes (Signed)
Hypokalemia   K replaced  

## 2012-08-09 NOTE — Clinical Social Work Note (Signed)
Clinical Social Worker was informed by CM that discharge is anticipated for Wednesday or Thursday. CSW called and spoke with Crystal at [facility] and she reported that they will need to complete an evaluation on patient and a representative will come tomorrow morning to assess. Per Crystal they do have in house PT/OT services and they can increase patient's plan of care, if needed. CSW will follow up with facility.   Rozetta Nunnery MSW, [employer] [phone]

## 2012-08-09 NOTE — Consult Note (Addendum)
WOC follow-up consult Note Reason for Consult: Left lower leg with chronic wounds followed by outpatient wound care center. Refer to previous consult note for measurements. Wound type: Full thickness in 2 areas Measurement: Unchanged in size since previous assessment. Wound bed: 10% yellow, 90% red Drainage (amount, consistency, odor) No odor, remains with mod amt green-yellow drainage Periwound: intact skin surrounding Dressing procedure/placement/frequency: Increase Aquacel to Q day, instead of 3 X week as pt was previously having dressing changes performed when ordered by the outpatient wound care center.  She can resume followup at the [facility] after discharge. Please re-consult if further assistance is needed.  Thank-you,  Cammie Mcgee MSN, RN, CWOCN, [place], CNS [phone]

## 2012-08-09 NOTE — Progress Notes (Signed)
CRITICAL VALUE ALERT  Critical value received: CO2 >45  Date of notification:  08/09/2012  Time of notification:  5:49 AM  Critical value read back:yes  Nurse who received alert:  Gregor Hams  MD notified (1st page):  Value expected, consistent with previous results  Time of first page:  Value expected, consistent with previous results  MD notified (2nd page):  Time of second page:  Responding MD:    Time MD responded:

## 2012-08-09 NOTE — Progress Notes (Signed)
Report from Night RN. Chart reviewed together. Handoff complete.  

## 2012-08-09 NOTE — Progress Notes (Signed)
ANTICOAGULATION CONSULT NOTE - Follow-up Consult  Pharmacy Consult for Coumadin  Indication: atrial fibrillation  No Known Allergies  Patient Measurements: Height: 5\' 6"  (167.6 cm) Weight: 158 lb 11.7 oz (72 kg) IBW/kg (Calculated) : 59.3   Vital Signs: Temp: 97.8 F (36.6 C) (04/15 0410) Temp src: Axillary (04/15 0410) BP: 86/42 mmHg (04/15 0418)  Labs:  Recent Labs  08/07/12 0520 08/08/12 0500 08/08/12 1022 08/09/12 0500  LABPROT 26.1* 22.6*  --  22.4*  INR 2.54* 2.09*  --  2.06*  CREATININE 1.27*  --  1.39* 1.51*    Estimated Creatinine Clearance: 30.2 ml/min (by C-G formula based on Cr of 1.51).   Medical History: Past Medical History  Diagnosis Date  . PAD (peripheral artery disease) 2013    left leg. sounds like she had angio-plasty   . Atrial fibrillation 2013    s/p [facility] that failed. ON medication  . Hypertension   . Osteoporosis   . DVT (deep venous thrombosis) 2013    "LLE" (08/04/2012)  . Hypothyroidism   . History of blood transfusion 2013    "related to thrombectomy" (08/04/2012)  . Anemia     "slightly" (08/04/2012)  . Arthritis     "probably minor; knees, pinky" (08/04/2012)  . Fracture of L4 vertebra     "dx'd in 2013; can't treat til legs fixed" (08/04/2012)      Assessment: 82yof recently moved here from [place], [place].  She has recent hx of LEE with minimal improvement with po furosemide as outpt.  She admitted 08/04/2012   with SOB and LEE.  She has Hx A-Fib and is anticoagulated with Coumadin.  INR on admit 4.7.  PTA regimen: 3 mg daily, INR was elevated 4/9 at ALF and Coumadin dose held 4/9 per ALF.   INR continues to be at goal, trend continues down after restart warfarin 4/13   Goal of Therapy:  INR 2-3 Monitor platelets by anticoagulation protocol: Yes   Plan:  1. Coumadin 3 mg daily.  May need reduction of her outpatient Coumadin dose given elevated INR at admission, but INR trend down so will continue with 3 mg today 2. Daily  INR.   Thank you for allowing pharmacy to be a part of this patients care team.  Sheppard Coil PharmD., BCPS Clinical Pharmacist Pager [phone] 08/09/2012 7:17 AM

## 2012-08-09 NOTE — Progress Notes (Signed)
Advanced Heart Failure Rounding Note   Subjective:    Kristina Diaz is a 79 y.o. female who returns for f/u on CHF and AFib.   She recently established with Dr. Olga Millers. She moved here from [place] recently. She has a hx of AFib and LE edema, HTN, hypothyroidism. She was initially not placed on anticoagulation due to fall risk. She was rate controlled and eventually placed on Xarelto 15 (low GFR). She declined ischemic evaluation or admission to the hospital. She was ultimately set up for TEE-DCCV in 03/2012 which was unsuccessful after multiple attempts. F/u echo demonstrated improved EF at 55%. She has h/o acute thrombus to LLE with subsequent thrombectomy.  Echo done here 07/28/12: EF 55%, normal wall motion, restrictive physiology indicative of decreased LV diastolic compliance and/or increased LA pressure, D-shaped interventricular septum suggesting RV pressure/volume overload, mild AI, mild MR, moderate to severe LAE, mild to moderate RVE, mild to moderate reduced RV systolic function, severe RAE, severe TR, PASP 42, possible pleural effusion and trivial pericardial effusion.   Admitted from Dr Waunita Schooner office with massive volume overload/RHF. Admit weight 203. Diuresis sluggish initally but much improved on Lasix drip at 15 mg per hour.  Last night lasix drip decreased to 4 mg per hour due to hypotension. Down another 5.2L. Weight now down 45 pounds total.  Denies SOB/PND/Orthopnea.   Creatinine 1.27>1.39>1.51 K 2.8 given 4 runs this am.      Objective:   Weight Range:  Vital Signs:   Temp:  [97.8 F (36.6 C)-98.5 F (36.9 C)] 97.8 F (36.6 C) (04/15 0410) Pulse Rate:  [60-69] 69 (04/14 1711) Resp:  [13-26] 13 (04/15 0418) BP: (86-96)/(34-71) 86/42 mmHg (04/15 0418) SpO2:  [93 %-98 %] 93 % (04/15 0410) Weight:  [72 kg (158 lb 11.7 oz)] 72 kg (158 lb 11.7 oz) (04/15 0500) Last BM Date: 08/08/12  Weight change: Filed Weights   08/07/12 0553 08/08/12 0457 08/09/12 0500   Weight: 80.7 kg (177 lb 14.6 oz) 76.3 kg (168 lb 3.4 oz) 72 kg (158 lb 11.7 oz)    Intake/Output:   Intake/Output Summary (Last 24 hours) at 08/09/12 0755 Last data filed at 08/09/12 0700  Gross per 24 hour  Intake 1596.6 ml  Output   6850 ml  Net -5253.4 ml     Physical Exam: CVP 6 General:  Chronically ill appearing. No resp difficulty HEENT: normal Neck: supple. JVP 5-6. Carotids 2+ bilat; no bruits. No lymphadenopathy or thryomegaly appreciated. Cor: PMI nondisplaced. Irregular rate & rhythm. No rubs, gallops or murmurs. Lungs: clear Abdomen: soft, nontender, nondistended. No hepatosplenomegaly. No bruits or masses. Good bowel sounds. Extremities: no cyanosis, clubbing, rash, R and LLE trace edema. LLE with foul-swelling wound with clean base. Neuro: alert & orientedx3, cranial nerves grossly intact. moves all 4 extremities w/o difficulty. Affect pleasant  Telemetry: Afib 60  Labs: Basic Metabolic Panel:  Recent Labs Lab 08/05/12 0555 08/06/12 0340 08/07/12 0520 08/08/12 1022 08/09/12 0500  NA 138 138 138 136 135  K 3.8 3.8 3.6 3.4* 2.8*  CL 102 97 93* 87* 84*  CO2 31 35* 41* 44* >45*  GLUCOSE 84 96 125* 152* 99  BUN 16 19 23  27* 32*  CREATININE 1.13* 1.26* 1.27* 1.39* 1.51*  CALCIUM 8.7 9.0 9.2 9.9 9.5    Liver Function Tests:  Recent Labs Lab 08/04/12 1414  AST 38*  ALT 17  ALKPHOS 126*  BILITOT 1.4*  PROT 7.2  ALBUMIN 2.7*   No results found for  this basename: LIPASE, AMYLASE,  in the last 168 hours No results found for this basename: AMMONIA,  in the last 168 hours  CBC:  Recent Labs Lab 08/04/12 1414  WBC 4.1  NEUTROABS 2.6  HGB 9.5*  HCT 29.1*  MCV 92.1  PLT 134*    Cardiac Enzymes:  Recent Labs Lab 08/04/12 1415 08/04/12 1717 08/04/12 2335  TROPONINI <0.30 <0.30 <0.30    BNP: BNP (last 3 results)  Recent Labs  07/26/12 1232 08/04/12 1415  PROBNP 641.0* 1382.0*     Imaging: No results found.   Medications:      Scheduled Medications: . acetaZOLAMIDE  500 mg Oral Q12H  . levothyroxine  50 mcg Oral QAC breakfast  . metoprolol succinate  25 mg Oral Daily  . multivitamin with minerals  1 tablet Oral q morning - 10a  . OxyCODONE  10 mg Oral Q12H  . potassium chloride  10 mEq Intravenous Q1 Hr x 4  . potassium chloride  20 mEq Oral BID  . sodium chloride  10-40 mL Intracatheter Q12H  . sodium chloride  3 mL Intravenous Q12H  . warfarin  3 mg Oral q1800  . Warfarin - Pharmacist Dosing Inpatient   Does not apply q1800    Infusions:    PRN Medications: sodium chloride, acetaminophen, barrier cream, hydrocortisone cream, ondansetron (ZOFRAN) IV, oxyCODONE, sodium chloride, sodium chloride   Assessment:  1. A/C diastolic heart failure 2. Restrictive Cardiomyopathy 3. A Fib 4. Deconditioning 6. Malnutrition - Albumin 2.7 7. Hypokalemia 8. L 4 fractrue 9. H/o embolic phenomenon to LLE s/p thrombectomy 10. Acute kidney injury 11. Hypokalemia 12. LLE chronic wound 13. Acute renal failure  Plan/Discussion:   Appears euvolemic. CVP down to 6. Creatinine bump noted. Stop lasix drip. Hold diuretics today. Start demadex 20 mg twice a day tomorrow. (may need 40 bid) Continue diamox. Overall weight down 45 pounds. Check BMET at 1200  Wound culture pending.   Continue coumadin for AF. May eventually switch to Eliquis if she can afford.   Appreciate PT input. Consult SW- Will need SNF per PT   Length of Stay: 5  Daniel Bensimhon,MD 7:56 AM    Advanced Heart Failure Team Pager [phone] (7a - 4p)  Please contact [facility] for night-coverage after hours (4p -7a ) on [email or web address]

## 2012-08-09 NOTE — Progress Notes (Addendum)
Hypotension; CVP 12   Decrease the lasix drip from 10 mg/h to 4 mg/h; consider stopping if BP continues to be low

## 2012-08-10 ENCOUNTER — Encounter (HOSPITAL_COMMUNITY): Payer: Medicare Other

## 2012-08-10 LAB — BASIC METABOLIC PANEL
Calcium: 9.1 mg/dL (ref 8.4–10.5)
GFR calc Af Amer: 36 mL/min — ABNORMAL LOW (ref >90)
GFR calc non Af Amer: 31 mL/min — ABNORMAL LOW (ref >90)
Glucose, Bld: 101 mg/dL — ABNORMAL HIGH (ref 70–99)
Potassium: 3.2 mEq/L — ABNORMAL LOW (ref 3.5–5.1)
Sodium: 135 mEq/L (ref 135–145)

## 2012-08-10 LAB — CARBOXYHEMOGLOBIN
Carboxyhemoglobin: 1.9 % — ABNORMAL HIGH (ref 0.5–1.5)
Methemoglobin: 0.7 % (ref 0.0–1.5)
O2 Saturation: 79.1 %
Total hemoglobin: 10 g/dL — ABNORMAL LOW (ref 12.0–16.0)

## 2012-08-10 MED ORDER — CIPROFLOXACIN HCL 250 MG PO TABS
250.0000 mg | ORAL_TABLET | Freq: Two times a day (BID) | ORAL | Status: DC
  Administered 2012-08-10 – 2012-08-11 (×3): 250 mg via ORAL
  Filled 2012-08-10 (×5): qty 1

## 2012-08-10 MED ORDER — POTASSIUM CHLORIDE CRYS ER 20 MEQ PO TBCR
40.0000 meq | EXTENDED_RELEASE_TABLET | Freq: Two times a day (BID) | ORAL | Status: AC
  Administered 2012-08-10 (×2): 40 meq via ORAL
  Filled 2012-08-10: qty 2

## 2012-08-10 MED ORDER — POTASSIUM CHLORIDE CRYS ER 20 MEQ PO TBCR
20.0000 meq | EXTENDED_RELEASE_TABLET | Freq: Two times a day (BID) | ORAL | Status: DC
  Administered 2012-08-10 – 2012-08-11 (×2): 20 meq via ORAL
  Filled 2012-08-10 (×3): qty 1

## 2012-08-10 MED ORDER — TORSEMIDE 20 MG PO TABS
20.0000 mg | ORAL_TABLET | Freq: Two times a day (BID) | ORAL | Status: DC
  Administered 2012-08-10 – 2012-08-11 (×2): 20 mg via ORAL
  Filled 2012-08-10 (×4): qty 1

## 2012-08-10 NOTE — Progress Notes (Addendum)
Advanced Heart Failure Rounding Note   Subjective:    Kristina Diaz is a 79 y.o. female who returns for f/u on CHF and AFib.   She recently established with Dr. Olga Millers. She moved here from [place] recently. She has a hx of AFib and LE edema, HTN, hypothyroidism. She was initially not placed on anticoagulation due to fall risk. She was rate controlled and eventually placed on Xarelto 15 (low GFR). She declined ischemic evaluation or admission to the hospital. She was ultimately set up for TEE-DCCV in 03/2012 which was unsuccessful after multiple attempts. F/u echo demonstrated improved EF at 55%. She has h/o acute thrombus to LLE with subsequent thrombectomy.  Echo done here 07/28/12: EF 55%, normal wall motion, restrictive physiology indicative of decreased LV diastolic compliance and/or increased LA pressure, D-shaped interventricular septum suggesting RV pressure/volume overload, mild AI, mild MR, moderate to severe LAE, mild to moderate RVE, mild to moderate reduced RV systolic function, severe RAE, severe TR, PASP 42, possible pleural effusion and trivial pericardial effusion.   Admitted from Dr Ludwig Clarks office with massive volume overload/RHF. Admit weight 203. Diuresis sluggish initally but much improved on Lasix drip at 15 mg per hour.    Diuretics held yesterday due to increase in Cr 1.13>>1.51.  Cr 1.52 today, weight down 2 more pounds.  Denies dyspnea, orthopnea or PND. Co-ox 79%. K 3.2.  CVP 8  Objective:   Weight Range:  Vital Signs:   Temp:  [98 F (36.7 C)-98.6 F (37 C)] 98 F (36.7 C) (04/16 0745) Pulse Rate:  [64] 64 (04/16 0745) Resp:  [10-16] 11 (04/16 0800) BP: (80-116)/(27-76) 103/76 mmHg (04/16 0800) SpO2:  [91 %-99 %] 97 % (04/16 0745) Weight:  [70.9 kg (156 lb 4.9 oz)] 70.9 kg (156 lb 4.9 oz) (04/16 0500) Last BM Date: 08/09/12  Weight change: Filed Weights   08/08/12 0457 08/09/12 0500 08/10/12 0500  Weight: 76.3 kg (168 lb 3.4 oz) 72 kg (158 lb 11.7  oz) 70.9 kg (156 lb 4.9 oz)    Intake/Output:   Intake/Output Summary (Last 24 hours) at 08/10/12 1006 Last data filed at 08/10/12 0900  Gross per 24 hour  Intake    400 ml  Output   2500 ml  Net  -2100 ml     Physical Exam:  General:  Chronically ill appearing. No resp difficulty HEENT: normal Neck: supple. JVP 6-7. Carotids 2+ bilat; no bruits. No lymphadenopathy or thryomegaly appreciated. Cor: PMI nondisplaced. Irregular rate & rhythm. No rubs, gallops or murmurs. Lungs: clear Abdomen: soft, nontender, nondistended. No hepatosplenomegaly. No bruits or masses. Good bowel sounds. Extremities: no cyanosis, clubbing, rash, R and LLE trace edema. LLE with foul-swelling wound with clean base. Neuro: alert & orientedx3, cranial nerves grossly intact. moves all 4 extremities w/o difficulty. Affect pleasant  Telemetry: Afib 60s  Labs: Basic Metabolic Panel:  Recent Labs Lab 08/07/12 0520 08/08/12 1022 08/09/12 0500 08/09/12 1200 08/10/12 0352  NA 138 136 135 132* 135  K 3.6 3.4* 2.8* 4.7 3.2*  CL 93* 87* 84* 85* 89*  CO2 41* 44* >45* 44* 43*  GLUCOSE 125* 152* 99 102* 101*  BUN 23 27* 32* 32* 37*  CREATININE 1.27* 1.39* 1.51* 1.44* 1.52*  CALCIUM 9.2 9.9 9.5 9.6 9.1    Liver Function Tests:  Recent Labs Lab 08/04/12 1414  AST 38*  ALT 17  ALKPHOS 126*  BILITOT 1.4*  PROT 7.2  ALBUMIN 2.7*   No results found for this basename: LIPASE, AMYLASE,  in the last 168 hours No results found for this basename: AMMONIA,  in the last 168 hours  CBC:  Recent Labs Lab 08/04/12 1414  WBC 4.1  NEUTROABS 2.6  HGB 9.5*  HCT 29.1*  MCV 92.1  PLT 134*    Cardiac Enzymes:  Recent Labs Lab 08/04/12 1415 08/04/12 1717 08/04/12 2335  TROPONINI <0.30 <0.30 <0.30    BNP: BNP (last 3 results)  Recent Labs  07/26/12 1232 08/04/12 1415  PROBNP 641.0* 1382.0*      Medications:     Scheduled Medications: . acetaZOLAMIDE  500 mg Oral Q12H  .  levothyroxine  50 mcg Oral QAC breakfast  . metoprolol succinate  25 mg Oral Daily  . multivitamin with minerals  1 tablet Oral q morning - 10a  . OxyCODONE  10 mg Oral Q12H  . potassium chloride  20 mEq Oral BID  . potassium chloride  40 mEq Oral BID  . sodium chloride  10-40 mL Intracatheter Q12H  . sodium chloride  3 mL Intravenous Q12H  . warfarin  3 mg Oral q1800  . Warfarin - Pharmacist Dosing Inpatient   Does not apply q1800    Infusions:    PRN Medications: sodium chloride, acetaminophen, barrier cream, hydrocortisone cream, ondansetron (ZOFRAN) IV, oxyCODONE, sodium chloride, sodium chloride   Assessment:   1. A/C diastolic heart failure 2. Restrictive Cardiomyopathy 3. Chronic A Fib 4. Deconditioning 6. Malnutrition - Albumin 2.7 7. Hypokalemia 8. L 4 fractrue 9. H/o embolic phenomenon to LLE s/p thrombectomy 10. Acute kidney injury 11. Hypokalemia 12. LLE chronic wound 13. Acute renal failure  Plan/Discussion:    Appears euvolemic and Cr remains elevated. She is getting close to discharge but ideally I would like to see her on a stable oral diuretic regimen prior to d/c. Will start torsemide 20 bid tonight. Continue diamox through today.  Replete K. Watch renal function.      Wound culture with rare GNRs. I worry about pseudomonas infection. Will start cipro.  Continue coumadin for AF. May eventually switch to Eliquis if she can afford.   Appreciate PT input.Discussed with CM she can go back to [facility] which offers all services. Hopefully ready for d/c tomorrow or Friday.  Length of Stay: 6   Advanced Heart Failure Team Pager [phone] (7a - 4p)  Please contact [facility] for night-coverage after hours (4p -7a ) on [email or web address]  Catlin Aycock,MD 10:11 AM

## 2012-08-10 NOTE — Progress Notes (Signed)
ANTICOAGULATION CONSULT NOTE - Follow-up Consult  Pharmacy Consult for Coumadin  Indication: atrial fibrillation  No Known Allergies  Patient Measurements: Height: 5\' 6"  (167.6 cm) Weight: 156 lb 4.9 oz (70.9 kg) IBW/kg (Calculated) : 59.3   Vital Signs: Temp: 98.1 F (36.7 C) (04/16 0422) Temp src: Oral (04/16 0422) BP: 94/52 mmHg (04/16 0422)  Labs:  Recent Labs  08/08/12 0500  08/09/12 0500 08/09/12 1200 08/10/12 0352  LABPROT 22.6*  --  22.4*  --  22.0*  INR 2.09*  --  2.06*  --  2.01*  CREATININE  --   < > 1.51* 1.44* 1.52*  < > = values in this interval not displayed.  Estimated Creatinine Clearance: 27.6 ml/min (by C-G formula based on Cr of 1.52).   Medical History: Past Medical History  Diagnosis Date  . PAD (peripheral artery disease) 2013    left leg. sounds like she had angio-plasty   . Atrial fibrillation 2013    s/p [facility] that failed. ON medication  . Hypertension   . Osteoporosis   . DVT (deep venous thrombosis) 2013    "LLE" (08/04/2012)  . Hypothyroidism   . History of blood transfusion 2013    "related to thrombectomy" (08/04/2012)  . Anemia     "slightly" (08/04/2012)  . Arthritis     "probably minor; knees, pinky" (08/04/2012)  . Fracture of L4 vertebra     "dx'd in 2013; can't treat til legs fixed" (08/04/2012)      Assessment: 82yof recently moved here from [place], [place].  She has recent hx of LEE with minimal improvement with po furosemide as outpt.  She admitted 08/04/2012   with SOB and LEE.  She has Hx A-Fib and is anticoagulated with Coumadin.  INR on admit 4.7.  PTA regimen: 3 mg daily, INR was elevated 4/9 at ALF and Coumadin dose held 4/9 per ALF.   INR continues to be at goal. No complications noted.  Goal of Therapy:  INR 2-3 Monitor platelets by anticoagulation protocol: Yes   Plan:  1. Coumadin 3 mg daily. Would continue home dose at discharge  Thank you for allowing pharmacy to be a part of this patients care  team.  Sheppard Coil PharmD., BCPS Clinical Pharmacist Pager [phone] 08/10/2012 7:06 AM

## 2012-08-10 NOTE — Clinical Social Work Note (Signed)
Clinical Social Worker spoke with Engineer, manufacturing at [facility] and they are able to accept patient back to facility when medically stable. CSW will continue to follow.  Rozetta Nunnery MSW, [employer] [phone]

## 2012-08-10 NOTE — Progress Notes (Signed)
Physical Therapy Treatment Patient Details Name: Kristina Diaz MRN: [id number] DOB: [birth year] Today's Date: 08/10/2012 Time: 4098-1191 PT Time Calculation (min): 24 min  PT Assessment / Plan / Recommendation Comments on Treatment Session  Pt progressing with transfers.  Required decreased (A).  She states her husband can only provide minimal (A) due to health issues but Nsing at ALF will be able to provide adequate (A) for transfers & per SW note, ALF has confirmed ability to increased pt's care.  Pt will need (A) for OOB + HHPT.  D/c plans updated    Follow Up Recommendations  Supervision/Assistance - 24 hour;Home health PT     Does the patient have the potential to tolerate intense rehabilitation     Barriers to Discharge        Equipment Recommendations       Recommendations for Other Services    Frequency Min 3X/week   Plan Discharge plan remains appropriate    Precautions / Restrictions Precautions Precautions: Fall Restrictions Weight Bearing Restrictions: No Other Position/Activity Restrictions: wound on Lt heel.  Pt unable to place foot flat on floor.         Mobility  Bed Mobility Bed Mobility: Supine to Sit;Sitting - Scoot to Edge of Bed Supine to Sit: 4: Min assist;HOB flat;With rails Sitting - Scoot to Edge of Bed: 4: Min assist Details for Bed Mobility Assistance: (A) to lift shoulders/trunk to sitting upright & use of draw pad to move hips forwards to EOB.    Transfers Transfers: Sit to Stand;Stand to [employer] Transfers Sit to Stand: 4: Min assist;With upper extremity assist;With armrests;From bed;From chair/3-in-1 Stand to Sit: 4: Min assist;With upper extremity assist;With armrests;To bed;To chair/3-in-1 Stand Pivot Transfers: 4: Min assist;3: Mod assist Details for Transfer Assistance: Cues for hand placement.  Pt required mod (A) to perform SPT recliner>bed but able to perform with Min (A) from bed>recliner.   Pt unable to place Lt foot flat on floor  due to heel wound.  (A) for balance.   Ambulation/Gait Ambulation/Gait Assistance: Not tested (comment)      PT Goals Acute Rehab PT Goals Time For Goal Achievement: 08/20/12 Potential to Achieve Goals: Fair Pt will go Supine/Side to Sit: with modified independence PT Goal: Supine/Side to Sit - Progress: Progressing toward goal Pt will go Sit to Supine/Side: with min assist Pt will go Sit to Stand: with supervision PT Goal: Sit to Stand - Progress: Progressing toward goal Pt will go Stand to Sit: with supervision PT Goal: Stand to Sit - Progress: Progressing toward goal Pt will Transfer Bed to Chair/Chair to Bed: with supervision PT Transfer Goal: Bed to Chair/Chair to Bed - Progress: Progressing toward goal Pt will Propel Wheelchair: 10 - 50 feet;with supervision  Visit Information  Last PT Received On: 08/10/12 Assistance Needed: +1    Subjective Data      Cognition  Cognition Arousal/Alertness: Awake/alert Behavior During Therapy: [facility] for tasks assessed/performed Overall Cognitive Status: Within Functional Limits for tasks assessed    Balance     End of Session PT - End of Session Equipment Utilized During Treatment: Gait belt Activity Tolerance: Patient tolerated treatment well Patient left: in chair;with call bell/phone within reach Nurse Communication: Mobility status     Verdell Face, [place] [phone] 08/10/2012

## 2012-08-11 ENCOUNTER — Inpatient Hospital Stay (HOSPITAL_COMMUNITY): Payer: Medicare Other

## 2012-08-11 ENCOUNTER — Telehealth: Payer: Self-pay | Admitting: Pulmonary Disease

## 2012-08-11 DIAGNOSIS — I739 Peripheral vascular disease, unspecified: Secondary | ICD-10-CM

## 2012-08-11 LAB — BASIC METABOLIC PANEL
CO2: 37 mEq/L — ABNORMAL HIGH (ref 19–32)
Calcium: 9.2 mg/dL (ref 8.4–10.5)
Creatinine, Ser: 1.44 mg/dL — ABNORMAL HIGH (ref 0.50–1.10)
GFR calc non Af Amer: 33 mL/min — ABNORMAL LOW (ref >90)

## 2012-08-11 LAB — PULMONARY FUNCTION TEST

## 2012-08-11 LAB — PROTIME-INR: INR: 1.98 — ABNORMAL HIGH (ref 0.00–1.49)

## 2012-08-11 MED ORDER — METOPROLOL SUCCINATE ER 25 MG PO TB24: 12.5000 mg | ORAL_TABLET | Freq: Every day | ORAL | Status: DC

## 2012-08-11 MED ORDER — METOPROLOL SUCCINATE 12.5 MG HALF TABLET
12.5000 mg | ORAL_TABLET | Freq: Every day | ORAL | Status: DC
  Filled 2012-08-11: qty 1

## 2012-08-11 MED ORDER — CIPROFLOXACIN HCL 250 MG PO TABS: 250.0000 mg | ORAL_TABLET | Freq: Two times a day (BID) | ORAL | Status: DC

## 2012-08-11 MED ORDER — WARFARIN SODIUM 4 MG PO TABS
4.5000 mg | ORAL_TABLET | Freq: Once | ORAL | Status: DC
  Filled 2012-08-11: qty 1

## 2012-08-11 MED ORDER — TORSEMIDE 20 MG PO TABS: 20.0000 mg | ORAL_TABLET | Freq: Two times a day (BID) | ORAL | Status: DC

## 2012-08-11 MED ORDER — POTASSIUM CHLORIDE CRYS ER 10 MEQ PO TBCR: 10.0000 meq | EXTENDED_RELEASE_TABLET | Freq: Two times a day (BID) | ORAL | Status: DC

## 2012-08-11 MED ORDER — WARFARIN SODIUM 3 MG PO TABS: 3.0000 mg | ORAL_TABLET | Freq: Every day | ORAL | Status: DC

## 2012-08-11 MED ORDER — ALBUTEROL SULFATE (5 MG/ML) 0.5% IN NEBU
2.5000 mg | INHALATION_SOLUTION | Freq: Once | RESPIRATORY_TRACT | Status: AC
  Administered 2012-08-11: 2.5 mg via RESPIRATORY_TRACT

## 2012-08-11 NOTE — Progress Notes (Signed)
PFT completed. PT unable to perform Lung Volume testing due to inability to bend knee enough to close booth door. PT did complete BA FVC and DLCO testing. Unconfirmed report placed in Shadow Chart.

## 2012-08-11 NOTE — Progress Notes (Signed)
Pt discharged via personal wheelchair to [facility] transportation service. Belongings and all discharge paperwork sent with patient. Reviewed AVS with patient; she denies any complaints or questions at this time.  Dawson Bills, RN

## 2012-08-11 NOTE — Progress Notes (Signed)
Advanced Heart Failure Rounding Note   Subjective:    Kristina Diaz is a 79 y.o. female who returns for f/u on CHF and AFib.   She recently established with Dr. Olga Millers. She moved here from [place] recently. She has a hx of AFib and LE edema, HTN, hypothyroidism. She was initially not placed on anticoagulation due to fall risk. She was rate controlled and eventually placed on Xarelto 15 (low GFR). She declined ischemic evaluation or admission to the hospital. She was ultimately set up for TEE-DCCV in 03/2012 which was unsuccessful after multiple attempts. F/u echo demonstrated improved EF at 55%. She has h/o acute thrombus to LLE with subsequent thrombectomy.  Echo done here 07/28/12: EF 55%, normal wall motion, restrictive physiology indicative of decreased LV diastolic compliance and/or increased LA pressure, D-shaped interventricular septum suggesting RV pressure/volume overload, mild AI, mild MR, moderate to severe LAE, mild to moderate RVE, mild to moderate reduced RV systolic function, severe RAE, severe TR, PASP 42, possible pleural effusion and trivial pericardial effusion.   Admitted from Dr Ludwig Clarks office with massive volume overload/RHF. Admit weight 203. Diuresis sluggish initally but much improved on Lasix drip at 15 mg per hour.    Torsemide restarted yesterday.  Cr 1.13>>1.51>1.44.  Weight down 5 more pounds.  Denies dyspnea, orthopnea or PND. CVP 11  Objective:     Vital Signs:   Temp:  [97.5 F (36.4 C)-98.1 F (36.7 C)] 97.5 F (36.4 C) (04/17 0741) Pulse Rate:  [59] 59 (04/17 0741) Resp:  [12-15] 14 (04/17 0356) BP: (91-114)/(45-57) 101/45 mmHg (04/17 0356) SpO2:  [94 %-99 %] 96 % (04/17 0741) Weight:  [68.4 kg (150 lb 12.7 oz)] 68.4 kg (150 lb 12.7 oz) (04/17 0400) Last BM Date: 08/09/12  Weight change: Filed Weights   08/09/12 0500 08/10/12 0500 08/11/12 0400  Weight: 72 kg (158 lb 11.7 oz) 70.9 kg (156 lb 4.9 oz) 68.4 kg (150 lb 12.7 oz)     Intake/Output:   Intake/Output Summary (Last 24 hours) at 08/11/12 0831 Last data filed at 08/10/12 2200  Gross per 24 hour  Intake    960 ml  Output   1650 ml  Net   -690 ml     Physical Exam:  General:  Chronically ill appearing. No resp difficulty HEENT: normal Neck: supple. JVP 6-7. Carotids 2+ bilat; no bruits. No lymphadenopathy or thryomegaly appreciated. Cor: PMI nondisplaced. Irregular rate & rhythm. No rubs, gallops or murmurs. Lungs: clear Abdomen: soft, nontender, nondistended. No hepatosplenomegaly. No bruits or masses. Good bowel sounds. Extremities: no cyanosis, clubbing, rash, R and LLE trace edema. LLE wound with clean base. Neuro: alert & orientedx3, cranial nerves grossly intact. moves all 4 extremities w/o difficulty. Affect pleasant  Telemetry: Afib 60s  Labs: Basic Metabolic Panel:  Recent Labs Lab 08/08/12 1022 08/09/12 0500 08/09/12 1200 08/10/12 0352 08/11/12 0400  NA 136 135 132* 135 133*  K 3.4* 2.8* 4.7 3.2* 3.5  CL 87* 84* 85* 89* 91*  CO2 44* >45* 44* 43* 37*  GLUCOSE 152* 99 102* 101* 106*  BUN 27* 32* 32* 37* 38*  CREATININE 1.39* 1.51* 1.44* 1.52* 1.44*  CALCIUM 9.9 9.5 9.6 9.1 9.2    Liver Function Tests:  Recent Labs Lab 08/04/12 1414  AST 38*  ALT 17  ALKPHOS 126*  BILITOT 1.4*  PROT 7.2  ALBUMIN 2.7*   No results found for this basename: LIPASE, AMYLASE,  in the last 168 hours No results found for this basename: AMMONIA,  in the last 168 hours  CBC:  Recent Labs Lab 08/04/12 1414  WBC 4.1  NEUTROABS 2.6  HGB 9.5*  HCT 29.1*  MCV 92.1  PLT 134*    Cardiac Enzymes:  Recent Labs Lab 08/04/12 1415 08/04/12 1717 08/04/12 2335  TROPONINI <0.30 <0.30 <0.30    BNP: BNP (last 3 results)  Recent Labs  07/26/12 1232 08/04/12 1415  PROBNP 641.0* 1382.0*      Medications:     Scheduled Medications: . ciprofloxacin  250 mg Oral BID  . levothyroxine  50 mcg Oral QAC breakfast  . metoprolol  succinate  12.5 mg Oral Daily  . multivitamin with minerals  1 tablet Oral q morning - 10a  . OxyCODONE  10 mg Oral Q12H  . potassium chloride  20 mEq Oral BID  . sodium chloride  10-40 mL Intracatheter Q12H  . sodium chloride  3 mL Intravenous Q12H  . torsemide  20 mg Oral BID  . [START ON 08/12/2012] warfarin  3 mg Oral q1800  . warfarin  4.5 mg Oral ONCE-1800  . Warfarin - Pharmacist Dosing Inpatient   Does not apply q1800    Infusions:    PRN Medications: sodium chloride, acetaminophen, barrier cream, hydrocortisone cream, ondansetron (ZOFRAN) IV, oxyCODONE, sodium chloride, sodium chloride   Assessment:   1. A/C diastolic heart failure 2. Restrictive Cardiomyopathy 3. Chronic A Fib 4. Deconditioning 6. Malnutrition - Albumin 2.7 7. Hypokalemia 8. L 4 fractrue 9. H/o embolic phenomenon to LLE s/p thrombectomy 10. Acute kidney injury 11. Hypokalemia 12. LLE chronic wound 13. Acute renal failure  Plan/Discussion:    Appears euvolemic and Cr slowly improving.  Will continue torsemide 20 mg BID.  Watch renal function.  Stop diamox  Continue cipro for 7 days for possible pseudomonas infection with wound culture showing rare GNRs.   Continue coumadin for AF. May eventually switch to Eliquis if she can afford. Patient bradycardic down to 40s, cut back toprol 12.5 mg daily.  Appreciate PT input.Discussed with CM she can go back to [facility] which offers all services. Should be able to discharge today. F/u in HF clinic next week.  Length of Stay: 7  Harriett Azar,MD 8:32 AM   Advanced Heart Failure Team Pager [phone] (7a - 4p)  Please contact [facility] for night-coverage after hours (4p -7a ) on [email or web address]

## 2012-08-11 NOTE — Telephone Encounter (Signed)
79 yo with LLE chronic wounds; wound culture received in elink tonight; pos for GNR and staph aureus; Final speciation pending;  The patient was discharged on cipro which covers GNR, however has no coverage for staph.   Dr. Gala Romney aware.

## 2012-08-11 NOTE — Progress Notes (Signed)
ANTICOAGULATION CONSULT NOTE - Follow-up Consult  Pharmacy Consult for Coumadin  Indication: atrial fibrillation  No Known Allergies  Patient Measurements: Height: 5\' 6"  (167.6 cm) Weight: 150 lb 12.7 oz (68.4 kg) IBW/kg (Calculated) : 59.3  Vital Signs: Temp: 97.5 F (36.4 C) (04/17 0741) Temp src: Oral (04/17 0741) BP: 101/45 mmHg (04/17 0356) Pulse Rate: 59 (04/17 0741)  Labs:  Recent Labs  08/09/12 0500 08/09/12 1200 08/10/12 0352 08/11/12 0400  LABPROT 22.4*  --  22.0* 21.7*  INR 2.06*  --  2.01* 1.98*  CREATININE 1.51* 1.44* 1.52* 1.44*    Estimated Creatinine Clearance: 29.2 ml/min (by C-G formula based on Cr of 1.44).   Medical History: Past Medical History  Diagnosis Date  . PAD (peripheral artery disease) 2013    left leg. sounds like she had angio-plasty   . Atrial fibrillation 2013    s/p [facility] that failed. ON medication  . Hypertension   . Osteoporosis   . DVT (deep venous thrombosis) 2013    "LLE" (08/04/2012)  . Hypothyroidism   . History of blood transfusion 2013    "related to thrombectomy" (08/04/2012)  . Anemia     "slightly" (08/04/2012)  . Arthritis     "probably minor; knees, pinky" (08/04/2012)  . Fracture of L4 vertebra     "dx'd in 2013; can't treat til legs fixed" (08/04/2012)      Assessment: 82yof recently moved here from [place], [place].  She has recent hx of LEE with minimal improvement with po furosemide as outpt.  She admitted 08/04/2012   with SOB and LEE.  She has Hx A-Fib and is anticoagulated with Coumadin.  INR on admit 4.7.  PTA regimen: 3 mg daily, INR was elevated 4/9 at ALF and Coumadin dose held 4/9 per ALF.   INR just below goal this morning. No complications noted. Noted patient started on cipro yesterday which can increase INR. Will bump up dose today then resume 3mg   Goal of Therapy:  INR 2-3 Monitor platelets by anticoagulation protocol: Yes   Plan:  1. Coumadin 4.5 mg daily. Would continue home dose of 3mg  at  discharge  Thank you for allowing pharmacy to be a part of this patients care team.  Sheppard Coil PharmD., BCPS Clinical Pharmacist Pager [phone] 08/11/2012 8:07 AM

## 2012-08-11 NOTE — Progress Notes (Signed)
Have added phy there and occup there to [facility] ref.

## 2012-08-11 NOTE — Clinical Social Work Note (Addendum)
Clinical Social Worker facilitated discharge by contacting facility, [facility] and met with patient and her husband, Kristina Diaz who was at bedside. Per husband, they are going to arrange transportation through [facility]. Husband reported son is is on the way to the facility to pick up patient's wheelchair to bring to hospital. CSW spoke with Crystal at facility and they are going to let CSW know if that is something they can arrange. Patient's discharge packet will be placed with shadow chart.   12:04pm CSW received call from Crystal and they are able to arrange transportation at 3pm. Patient will need to be down waiting at 3pm. Facility will need an order to resume home health RN.   Rozetta Nunnery MSW, [employer] [phone]

## 2012-08-11 NOTE — Discharge Summary (Signed)
Advanced Heart Failure Team  Discharge Summary   Patient ID: Kristina Diaz MRN: [id number], DOB/AGE: Jan 10, 1930 79 y.o. Admit date: 08/04/2012 D/C date:     08/11/2012   Primary Discharge Diagnoses:  1. A/C diastolic heart failure with restrictive physiology on echo 2. Right heart failure with massive volume overload 3. A Fib, chronic  Secondary Discharge Diagnoses. 4. Deconditioning 5. Hypothyroidism, chronic 6. Malnutrition - Albumin 2.7  7. Hypokalemia  8. H/o embolic phenomenon to LLE s/p thrombectomy  9. Acute kidney injury due to diuresis, improving 10. Hypokalemia  11. LLE chronic wound with gram negative rods on wound culture (speciation pending)    Hospital Course:  Kristina Diaz is a 79 year old with a history of AFib (on coumadin, failed DCCV in 12/13)), chronic LLE wound,  HTN,  Previous thrombus to LLE with subsequent thrombectomy and hypothyroidism. Recently relocated from [place]. Presented to Dr. Dr Waunita Schooner office with massive volume overload/RHF. Admitted for diuresis.  Admit weight 203. Due to sluggish diuresis with IV lasix she was transferred to Step down for PICC insertion and CVP monitoring. Initial CVP > 20. She was diuresed with a lasix drip 15 mg per hour and transitioned to lasix 40 mg bid.   Overall his weight is down 53 pounds total. Renal function remained stable. Transitioned to po demadex 20 bid. Discharge weight 150 pound.  Echo LVEF 55%. RV mild to moderately dilated/dysfunction with D-shaped septum. PA pressures not elevated by echo.   Rate remained controlled and she continued on Coumadin. Followed by [facility] Nurses for LLE chronic wound. Conservative wound care provided with aquacel ag daily. Wound culture revealed rare GNR (speciation pending) Started on cipro  She will discharge today to [facility] with daily wound care to LLE and PT. She will continue to be followed closely in the HF clinic with scheduled August 17, 2013 at 10:15.      Discharge Weight Range: 150 pounds Discharge Vitals: Blood pressure 101/45, pulse 59, temperature 97.5 F (36.4 C), temperature source Oral, resp. rate 14, height 5\' 6"  (1.676 m), weight 68.4 kg (150 lb 12.7 oz), SpO2 96.00%.  Labs: Lab Results  Component Value Date   WBC 4.1 08/04/2012   HGB 9.5* 08/04/2012   HCT 29.1* 08/04/2012   MCV 92.1 08/04/2012   PLT 134* 08/04/2012    Recent Labs Lab 08/04/12 1414  08/11/12 0400  NA 140  < > 133*  K 3.2*  < > 3.5  CL 101  < > 91*  CO2 32  < > 37*  BUN 17  < > 38*  CREATININE 1.13*  < > 1.44*  CALCIUM 8.8  < > 9.2  PROT 7.2  --   --   BILITOT 1.4*  --   --   ALKPHOS 126*  --   --   ALT 17  --   --   AST 38*  --   --   GLUCOSE 109*  < > 106*  < > = values in this interval not displayed. No results found for this basename: CHOL,  HDL,  LDLCALC,  TRIG   BNP (last 3 results)  Recent Labs  07/26/12 1232 08/04/12 1415  PROBNP 641.0* 1382.0*    Diagnostic Studies/Procedures   No results found.  Discharge Medications     Medication List    STOP taking these medications       amiodarone 200 MG tablet  Commonly known as:  PACERONE     furosemide 40 MG tablet  Commonly known as:  LASIX      TAKE these medications       acetaminophen 325 MG tablet  Commonly known as:  TYLENOL  Take by mouth every 8 (eight) hours as needed for pain.     ciprofloxacin 250 MG tablet  Commonly known as:  CIPRO  Take 1 tablet (250 mg total) by mouth 2 (two) times daily.     clotrimazole 1 % cream  Commonly known as:  LOTRIMIN  Apply 1 application topically 2 (two) times daily. Apply for 14 days.  Order date 06/10/2012.     collagenase ointment  Commonly known as:  SANTYL  Apply 1 application topically daily. Being applied at wound care center, MWF by Home Health nurse     levothyroxine 25 MCG tablet  Commonly known as:  SYNTHROID, LEVOTHROID  TAKE 1 AND 1/2 TABS (37.5MG ) BY MOUTH EVERY DAY*CHECK PULSE WEEKLY*     liver  oil-zinc oxide 40 % ointment  Commonly known as:  DESITIN  Apply 1 application topically as needed for dry skin.     metoprolol succinate 25 MG 24 hr tablet  Commonly known as:  TOPROL-XL  Take 0.5 tablets (12.5 mg total) by mouth daily. Hold for SBP <110 and HR <60 and call MD.     multivitamin with minerals Tabs  Take 1 tablet by mouth every morning.     nystatin powder  Commonly known as:  MYCOSTATIN  Apply topically 4 (four) times daily.     oxyCODONE 10 MG 12 hr tablet  Commonly known as:  OXYCONTIN  Take 1 tablet (10 mg total) by mouth 2 (two) times daily.     oxyCODONE 5 MG immediate release tablet  Commonly known as:  Oxy IR/ROXICODONE  Take 5 mg by mouth 4 (four) times daily as needed for pain.     potassium chloride 10 MEQ tablet  Commonly known as:  K-DUR,KLOR-CON  Take 1 tablet (10 mEq total) by mouth 2 (two) times daily.     torsemide 20 MG tablet  Commonly known as:  DEMADEX  Take 1 tablet (20 mg total) by mouth 2 (two) times daily.     warfarin 3 MG tablet  Commonly known as:  COUMADIN  Take 3 mg by mouth. On hold since 08/03/12        Disposition   The patient will be discharged in stable condition to home.  Future Appointments Provider Department Dept Phone   08/18/2012 8:45 AM [facility] [phone]     Follow-up Information   Follow up with Arvilla Meres, MD On 08/18/2012. (at 8:30a  [facility] Code [id number]))    Contact information:   [address] [place] [address] [phone]         Duration of Discharge Encounter: Greater than 35 minutes   Signed, Reuel Boom Bensimhon,MD 11:18 AM

## 2012-08-12 ENCOUNTER — Telehealth (HOSPITAL_COMMUNITY): Payer: Self-pay | Admitting: Physician Assistant

## 2012-08-12 LAB — WOUND CULTURE: Special Requests: NORMAL

## 2012-08-12 NOTE — Telephone Encounter (Signed)
LLE wound culture pos for CNR and staph aureus.  Per Dr. Gala Romney will add doxycycline for 10 days for staph coverage.  Rx faxed to [facility], nurse aware.

## 2012-08-17 ENCOUNTER — Telehealth: Payer: Self-pay

## 2012-08-17 ENCOUNTER — Encounter (HOSPITAL_COMMUNITY): Payer: Medicare Other

## 2012-08-17 NOTE — Telephone Encounter (Signed)
Ok OTC cortaid cream tid Thx

## 2012-08-17 NOTE — Telephone Encounter (Signed)
Phone call from Aniceto Boss, RN with [facility] [phone]. She states pt returned back to [facility] from the hospital last week and she has a rash on her left arm from the adhesive. Requesting an order for Hydrocortisone cream or something. This can be faxed to [phone]. Also she would like to know when pt's INR should be checked again. Please advise.

## 2012-08-18 ENCOUNTER — Encounter (HOSPITAL_COMMUNITY): Payer: Self-pay

## 2012-08-18 ENCOUNTER — Ambulatory Visit (HOSPITAL_COMMUNITY)
Admit: 2012-08-18 | Discharge: 2012-08-18 | Disposition: A | Discharge status: Home / Self Care | Payer: Medicare Other | Attending: Internal Medicine | Admitting: Internal Medicine

## 2012-08-18 VITALS — BP 120/62 | HR 75 | Wt 149.4 lb

## 2012-08-18 DIAGNOSIS — S81802D Unspecified open wound, left lower leg, subsequent encounter: Secondary | ICD-10-CM

## 2012-08-18 DIAGNOSIS — I5032 Chronic diastolic (congestive) heart failure: Secondary | ICD-10-CM

## 2012-08-18 DIAGNOSIS — Z5189 Encounter for other specified aftercare: Secondary | ICD-10-CM

## 2012-08-18 DIAGNOSIS — I4891 Unspecified atrial fibrillation: Secondary | ICD-10-CM

## 2012-08-18 NOTE — Assessment & Plan Note (Signed)
Continue doxycycline

## 2012-08-18 NOTE — Assessment & Plan Note (Signed)
Rate controlled.  Continue toprol and coumadin.

## 2012-08-18 NOTE — Telephone Encounter (Signed)
Pls ref to [facility] and make an appt w/Dr Norins in 4-6 wks Thx

## 2012-08-18 NOTE — Assessment & Plan Note (Signed)
Volume status looks good but Cr 1.6 (up from 1.4) and BUN up to 51 therefore will cut back to once daily torsemide for 3 days and then increase to BID.  Recheck BMET on Monday.  Encouraged her to keep making healthy/low sodium choices at meals.  I have provided her with a scale today, she will start weighing daily.

## 2012-08-18 NOTE — Telephone Encounter (Signed)
Faxed order to [facility] [phone] that okay for Cortaid Cream TID for left arm rash.

## 2012-08-18 NOTE — Telephone Encounter (Signed)
And when should pt's INR be checked? Please advise.

## 2012-08-18 NOTE — Progress Notes (Signed)
HPI: Kristina Diaz is an 79 year old with a history of AFib (on coumadin, failed DCCV in 12/13)), chronic LLE wound, HTN, Previous thrombus to LLE with subsequent thrombectomy and hypothyroidism. Recently relocated from [place].   She presented to Dr. Dr Waunita Schooner office with massive volume overload/RHF. Admitted for diuresis.  Required lasix gtt for diuresis, diuresed 53 pounds.  Discharge weight 150 pounds.  Stay c/b by wound + for GNR started on cipro, later culture revealed staph aureus so started on doxycycline.    Echo 07/28/12 LVEF 55%. RV mild to moderately dilated/dysfunction with D-shaped septum. PA pressures not elevated by echo.   Returns for hospital follow up today with her husband.  She was discharged back to assisted living at [facility].  She is also followed by [place].  She is watching sodium intake and fluid carefully.  Meals are prepared for her but she is picking lower salt options.  No orthopnea/PND.  Mild lower extremity edema.  Completing PT with [place].    Labs 4/23 Cr 1.65, BUN 51    ROS: All systems negative except as listed in HPI, PMH and Problem List.  Past Medical History  Diagnosis Date  . PAD (peripheral artery disease) 2013    left leg. sounds like she had angio-plasty   . Atrial fibrillation 2013    s/p [facility] that failed. ON medication  . Hypertension   . Osteoporosis   . DVT (deep venous thrombosis) 2013    "LLE" (08/04/2012)  . Hypothyroidism   . History of blood transfusion 2013    "related to thrombectomy" (08/04/2012)  . Anemia     "slightly" (08/04/2012)  . Arthritis     "probably minor; knees, pinky" (08/04/2012)  . Fracture of L4 vertebra     "dx'd in 2013; can't treat til legs fixed" (08/04/2012)    Current Outpatient Prescriptions  Medication Sig Dispense Refill  . acetaminophen (TYLENOL) 325 MG tablet Take by mouth every 8 (eight) hours as needed for pain.      . ciprofloxacin (CIPRO) 250 MG tablet Take 1 tablet (250 mg total) by  mouth 2 (two) times daily.  18 tablet  0  . clotrimazole (LOTRIMIN) 1 % cream Apply 1 application topically 2 (two) times daily. Apply for 14 days.  Order date 06/10/2012.      . collagenase (SANTYL) ointment Apply 1 application topically daily. Being applied at wound care center, MWF by Home Health nurse      . levothyroxine (SYNTHROID, LEVOTHROID) 25 MCG tablet TAKE 1 AND 1/2 TABS (37.5MG ) BY MOUTH EVERY DAY*CHECK PULSE WEEKLY*  45 tablet  11  . liver oil-zinc oxide (DESITIN) 40 % ointment Apply 1 application topically as needed for dry skin.      . metoprolol succinate (TOPROL-XL) 25 MG 24 hr tablet Take 0.5 tablets (12.5 mg total) by mouth daily. Hold for SBP <110 and HR <60 and call MD.  30 tablet  11  . Multiple Vitamin (MULTIVITAMIN WITH MINERALS) TABS Take 1 tablet by mouth every morning.      . nystatin (MYCOSTATIN) powder Apply topically 4 (four) times daily.  15 g  11  . oxyCODONE (OXY IR/ROXICODONE) 5 MG immediate release tablet Take 5 mg by mouth 4 (four) times daily as needed for pain.      Marland Kitchen oxyCODONE (OXYCONTIN) 10 MG 12 hr tablet Take 1 tablet (10 mg total) by mouth 2 (two) times daily.  60 tablet  0  . potassium chloride (K-DUR,KLOR-CON) 10 MEQ tablet  Take 1 tablet (10 mEq total) by mouth 2 (two) times daily.  30 tablet  1  . torsemide (DEMADEX) 20 MG tablet Take 1 tablet (20 mg total) by mouth 2 (two) times daily.  60 tablet  10  . warfarin (COUMADIN) 3 MG tablet Take 3 mg by mouth. On hold since 08/03/12       No current facility-administered medications for this encounter.     PHYSICAL EXAM: Filed Vitals:   08/18/12 0846  BP: 120/62  Pulse: 75  Weight: 149 lb 6.4 oz (67.767 kg)  SpO2: 99%    General: Chronically ill appearing. No resp difficulty  HEENT: normal  Neck: supple. JVP 6-7. Carotids 2+ bilat; no bruits. No lymphadenopathy or thryomegaly appreciated.  Cor: PMI nondisplaced. Irregular rate & rhythm. No rubs, gallops or murmurs.  Lungs: clear  Abdomen: soft,  nontender, nondistended. No hepatosplenomegaly. No bruits or masses. Good bowel sounds.  Extremities: no cyanosis, clubbing, rash, R and LLE trace edema. LLE wound bandaged.    Neuro: alert & orientedx3, cranial nerves grossly intact. moves all 4 extremities w/o difficulty. Affect pleasant    ASSESSMENT & PLAN:

## 2012-08-22 ENCOUNTER — Ambulatory Visit (INDEPENDENT_AMBULATORY_CARE_PROVIDER_SITE_OTHER): Payer: Medicare Other | Admitting: General Practice

## 2012-08-22 DIAGNOSIS — Z7901 Long term (current) use of anticoagulants: Secondary | ICD-10-CM | POA: Insufficient documentation

## 2012-08-22 DIAGNOSIS — I739 Peripheral vascular disease, unspecified: Secondary | ICD-10-CM

## 2012-08-22 DIAGNOSIS — I4891 Unspecified atrial fibrillation: Secondary | ICD-10-CM

## 2012-08-26 ENCOUNTER — Telehealth: Payer: Self-pay | Admitting: Internal Medicine

## 2012-08-26 NOTE — Telephone Encounter (Signed)
Phone call from [facility] with [facility] [phone] stating she has not received the face to face faxed back on this pt. At this time she says pt is in declined category for PT since full. Skilled nursing can still be done, just need a new order.

## 2012-08-26 NOTE — Telephone Encounter (Signed)
Kingsley Callander, nurse from [facility] call to inform Dr. Debby Bud that pt's BP was 98/60 this morning 08/26/12. Please advise.

## 2012-08-26 NOTE — Telephone Encounter (Signed)
Noted - no change recommended unless symptomatic

## 2012-08-29 ENCOUNTER — Ambulatory Visit (INDEPENDENT_AMBULATORY_CARE_PROVIDER_SITE_OTHER): Payer: Medicare Other | Admitting: General Practice

## 2012-08-29 DIAGNOSIS — I4891 Unspecified atrial fibrillation: Secondary | ICD-10-CM

## 2012-08-29 DIAGNOSIS — Z7901 Long term (current) use of anticoagulants: Secondary | ICD-10-CM

## 2012-08-29 DIAGNOSIS — I739 Peripheral vascular disease, unspecified: Secondary | ICD-10-CM

## 2012-08-29 LAB — POCT INR: INR: 3

## 2012-08-30 ENCOUNTER — Encounter (HOSPITAL_BASED_OUTPATIENT_CLINIC_OR_DEPARTMENT_OTHER): Payer: Medicare Other | Attending: General Surgery

## 2012-08-30 DIAGNOSIS — L89609 Pressure ulcer of unspecified heel, unspecified stage: Secondary | ICD-10-CM | POA: Insufficient documentation

## 2012-08-30 DIAGNOSIS — L89899 Pressure ulcer of other site, unspecified stage: Secondary | ICD-10-CM | POA: Insufficient documentation

## 2012-08-30 DIAGNOSIS — L8993 Pressure ulcer of unspecified site, stage 3: Secondary | ICD-10-CM | POA: Insufficient documentation

## 2012-09-05 ENCOUNTER — Ambulatory Visit (INDEPENDENT_AMBULATORY_CARE_PROVIDER_SITE_OTHER): Payer: Self-pay | Admitting: General Practice

## 2012-09-05 DIAGNOSIS — I739 Peripheral vascular disease, unspecified: Secondary | ICD-10-CM

## 2012-09-05 DIAGNOSIS — I4891 Unspecified atrial fibrillation: Secondary | ICD-10-CM

## 2012-09-05 DIAGNOSIS — Z7901 Long term (current) use of anticoagulants: Secondary | ICD-10-CM

## 2012-09-05 LAB — POCT INR: INR: 2.4

## 2012-09-06 ENCOUNTER — Ambulatory Visit (HOSPITAL_COMMUNITY)
Admission: RE | Admit: 2012-09-06 | Discharge: 2012-09-06 | Disposition: A | Discharge status: Home / Self Care | Payer: Medicare Other | Source: Ambulatory Visit | Attending: Internal Medicine | Admitting: Internal Medicine

## 2012-09-06 VITALS — BP 120/70 | HR 88 | Wt 151.0 lb

## 2012-09-06 DIAGNOSIS — M810 Age-related osteoporosis without current pathological fracture: Secondary | ICD-10-CM | POA: Insufficient documentation

## 2012-09-06 DIAGNOSIS — M8448XA Pathological fracture, other site, initial encounter for fracture: Secondary | ICD-10-CM | POA: Insufficient documentation

## 2012-09-06 DIAGNOSIS — Z8673 Personal history of transient ischemic attack (TIA), and cerebral infarction without residual deficits: Secondary | ICD-10-CM | POA: Insufficient documentation

## 2012-09-06 DIAGNOSIS — I4891 Unspecified atrial fibrillation: Secondary | ICD-10-CM | POA: Insufficient documentation

## 2012-09-06 DIAGNOSIS — I739 Peripheral vascular disease, unspecified: Secondary | ICD-10-CM | POA: Insufficient documentation

## 2012-09-06 DIAGNOSIS — I5032 Chronic diastolic (congestive) heart failure: Secondary | ICD-10-CM

## 2012-09-06 DIAGNOSIS — E039 Hypothyroidism, unspecified: Secondary | ICD-10-CM | POA: Insufficient documentation

## 2012-09-06 DIAGNOSIS — I10 Essential (primary) hypertension: Secondary | ICD-10-CM | POA: Insufficient documentation

## 2012-09-06 DIAGNOSIS — Z7901 Long term (current) use of anticoagulants: Secondary | ICD-10-CM | POA: Insufficient documentation

## 2012-09-06 DIAGNOSIS — Z79899 Other long term (current) drug therapy: Secondary | ICD-10-CM | POA: Insufficient documentation

## 2012-09-06 DIAGNOSIS — M1710 Unilateral primary osteoarthritis, unspecified knee: Secondary | ICD-10-CM | POA: Insufficient documentation

## 2012-09-06 NOTE — Assessment & Plan Note (Signed)
Volume status stable. Continue current diuretic regimen. Would like to keep her weight < 154 pounds. I have provided instructions for Assisted Living Staff to contact HF clinic if his weight is 155 pounds or greater. Follow up in 1 month to reassess volume status.

## 2012-09-06 NOTE — Progress Notes (Signed)
Patient ID: [name], female   DOB: [birth year], 79 y.o.   MRN: [id number]  HPI: Kristina Diaz is an 79 year old with a history of AFib (on coumadin, failed DCCV in 12/13)), chronic LLE wound, HTN, Previous thrombus to LLE with subsequent thrombectomy and hypothyroidism.   Admitted to [facility] 08/04/12 from  Dr Waunita Schooner office with massive volume overload/RHF. Required lasix gtt for diuresis, diuresed 53 pounds.  Discharge weight 150 pounds.  Stay c/b by wound + for GNR started on cipro, later culture revealed staph aureus so started on doxycycline.    Echo 07/28/12 LVEF 55%. RV mild to moderately dilated/dysfunction with D-shaped septum. PA pressures not elevated by echo.    She returns for follow up. Overall she feels very good. Denies SOB/PND/Orthopnea. She sleeps with HOB elevated due to back pain. Weight at home 147-151 pounds.  Medications provided by staff at [facility]. Completing PT with [place].  Able to ambulate in her apartment. She is followed at the outpatient wound center for lower extremity wound. She has completed antibiotics. She and her husband live in Assisted Living at [facility]. Uses wheelchair out of the facility.    ROS: All systems negative except as listed in HPI, PMH and Problem List.  Past Medical History  Diagnosis Date  . PAD (peripheral artery disease) 2013    left leg. sounds like she had angio-plasty   . Atrial fibrillation 2013    s/p [facility] that failed. ON medication  . Hypertension   . Osteoporosis   . DVT (deep venous thrombosis) 2013    "LLE" (08/04/2012)  . Hypothyroidism   . History of blood transfusion 2013    "related to thrombectomy" (08/04/2012)  . Anemia     "slightly" (08/04/2012)  . Arthritis     "probably minor; knees, pinky" (08/04/2012)  . Fracture of L4 vertebra     "dx'd in 2013; can't treat til legs fixed" (08/04/2012)    Current Outpatient Prescriptions  Medication Sig Dispense Refill  . acetaminophen (TYLENOL) 325 MG tablet Take  by mouth every 8 (eight) hours as needed for pain.      . clotrimazole (LOTRIMIN) 1 % cream Apply 1 application topically 2 (two) times daily. Apply for 14 days.  Order date 06/10/2012.      . collagenase (SANTYL) ointment Apply 1 application topically daily. Being applied at wound care center, MWF by Home Health nurse      . levothyroxine (SYNTHROID, LEVOTHROID) 25 MCG tablet TAKE 1 AND 1/2 TABS (37.5MG ) BY MOUTH EVERY DAY*CHECK PULSE WEEKLY*  45 tablet  11  . liver oil-zinc oxide (DESITIN) 40 % ointment Apply 1 application topically as needed for dry skin.      . metoprolol succinate (TOPROL-XL) 25 MG 24 hr tablet Take 0.5 tablets (12.5 mg total) by mouth daily. Hold for SBP <110 and HR <60 and call MD.  30 tablet  11  . Multiple Vitamin (MULTIVITAMIN WITH MINERALS) TABS Take 1 tablet by mouth every morning.      . nystatin (MYCOSTATIN) powder Apply topically 4 (four) times daily.  15 g  11  . oxyCODONE (OXY IR/ROXICODONE) 5 MG immediate release tablet Take 5 mg by mouth 4 (four) times daily as needed for pain.      Marland Kitchen oxyCODONE (OXYCONTIN) 10 MG 12 hr tablet Take 1 tablet (10 mg total) by mouth 2 (two) times daily.  60 tablet  0  . potassium chloride (K-DUR,KLOR-CON) 10 MEQ tablet Take 1 tablet (10 mEq total)  by mouth 2 (two) times daily.  30 tablet  1  . torsemide (DEMADEX) 20 MG tablet Take 1 tablet (20 mg total) by mouth 2 (two) times daily.  60 tablet  10  . warfarin (COUMADIN) 3 MG tablet Take 3 mg by mouth. On hold since 08/03/12       No current facility-administered medications for this encounter.     PHYSICAL EXAM: Filed Vitals:   09/06/12 1009  BP: 120/70  Pulse: 88  Weight: 151 lb (68.493 kg)  SpO2: 98%    General: Chronically ill appearing. No resp difficulty Husband present Sitting in wheel chair.  HEENT: normal  Neck: supple. JVP 6-7. Carotids 2+ bilat; no bruits. No lymphadenopathy or thryomegaly appreciated.  Cor: PMI nondisplaced. Irregular rate & rhythm. No rubs,  gallops or murmurs.  Lungs: clear  Abdomen: soft, nontender, nondistended. No hepatosplenomegaly. No bruits or masses. Good bowel sounds.  Extremities: no cyanosis, clubbing, rash, R and LLE trace edema. LLE wound bandaged.    Neuro: alert & orientedx3, cranial nerves grossly intact. moves all 4 extremities w/o difficulty. Affect pleasant    ASSESSMENT & PLAN:

## 2012-09-09 ENCOUNTER — Other Ambulatory Visit: Payer: Self-pay

## 2012-09-09 MED ORDER — OXYCODONE HCL 10 MG PO TB12: 10.0000 mg | ORAL_TABLET | Freq: Two times a day (BID) | ORAL | Status: DC

## 2012-09-09 NOTE — Telephone Encounter (Signed)
Called crystal no answer LMOM RTC...Kristina Diaz

## 2012-09-09 NOTE — Telephone Encounter (Signed)
Crystal, wellness nurse at [facility] [phone] calls stating pt needs a new hard script faxed for her Oxycontin 10 mg 1 tab every 12 hours

## 2012-09-09 NOTE — Telephone Encounter (Signed)
Crystal return call back she states can fax script to [facility] @ [phone]....Raechel Chute

## 2012-09-09 NOTE — Telephone Encounter (Signed)
Done hardcopy to robin  

## 2012-09-12 ENCOUNTER — Ambulatory Visit (INDEPENDENT_AMBULATORY_CARE_PROVIDER_SITE_OTHER): Payer: Self-pay | Admitting: General Practice

## 2012-09-12 DIAGNOSIS — I739 Peripheral vascular disease, unspecified: Secondary | ICD-10-CM

## 2012-09-12 DIAGNOSIS — I4891 Unspecified atrial fibrillation: Secondary | ICD-10-CM

## 2012-09-12 DIAGNOSIS — Z7901 Long term (current) use of anticoagulants: Secondary | ICD-10-CM

## 2012-09-14 ENCOUNTER — Encounter: Payer: Self-pay | Admitting: Internal Medicine

## 2012-09-21 ENCOUNTER — Ambulatory Visit (INDEPENDENT_AMBULATORY_CARE_PROVIDER_SITE_OTHER): Payer: Medicare Other | Admitting: General Practice

## 2012-09-21 DIAGNOSIS — I4891 Unspecified atrial fibrillation: Secondary | ICD-10-CM

## 2012-09-21 DIAGNOSIS — I739 Peripheral vascular disease, unspecified: Secondary | ICD-10-CM

## 2012-09-21 DIAGNOSIS — Z7901 Long term (current) use of anticoagulants: Secondary | ICD-10-CM

## 2012-09-24 ENCOUNTER — Other Ambulatory Visit (HOSPITAL_COMMUNITY): Payer: Self-pay | Admitting: Physician Assistant

## 2012-09-26 ENCOUNTER — Other Ambulatory Visit: Payer: Self-pay

## 2012-09-26 MED ORDER — METOPROLOL SUCCINATE ER 25 MG PO TB24: 12.5000 mg | ORAL_TABLET | Freq: Every day | ORAL | Status: DC

## 2012-09-27 ENCOUNTER — Encounter (HOSPITAL_BASED_OUTPATIENT_CLINIC_OR_DEPARTMENT_OTHER): Payer: Medicare Other | Attending: General Surgery

## 2012-09-27 DIAGNOSIS — L89899 Pressure ulcer of other site, unspecified stage: Secondary | ICD-10-CM | POA: Insufficient documentation

## 2012-09-27 DIAGNOSIS — L89609 Pressure ulcer of unspecified heel, unspecified stage: Secondary | ICD-10-CM | POA: Insufficient documentation

## 2012-09-27 DIAGNOSIS — L8993 Pressure ulcer of unspecified site, stage 3: Secondary | ICD-10-CM | POA: Insufficient documentation

## 2012-10-05 ENCOUNTER — Ambulatory Visit (INDEPENDENT_AMBULATORY_CARE_PROVIDER_SITE_OTHER): Payer: Medicare Other | Admitting: Family Medicine

## 2012-10-05 ENCOUNTER — Telehealth: Payer: Self-pay

## 2012-10-05 DIAGNOSIS — I4891 Unspecified atrial fibrillation: Secondary | ICD-10-CM

## 2012-10-05 DIAGNOSIS — Z7901 Long term (current) use of anticoagulants: Secondary | ICD-10-CM

## 2012-10-05 DIAGNOSIS — I739 Peripheral vascular disease, unspecified: Secondary | ICD-10-CM

## 2012-10-05 LAB — POCT INR: INR: 1.8

## 2012-10-05 NOTE — Telephone Encounter (Signed)
Karen notified

## 2012-10-05 NOTE — Telephone Encounter (Signed)
Phone call from Kingsley Callander at [facility] [phone]. Patients BP today is 100/58. Yesterday BP was also 100/58. Please advise.

## 2012-10-05 NOTE — Telephone Encounter (Signed)
No change in medication. Be sure she is hydrating.

## 2012-10-06 ENCOUNTER — Encounter (HOSPITAL_COMMUNITY): Payer: Self-pay

## 2012-10-06 ENCOUNTER — Ambulatory Visit (HOSPITAL_COMMUNITY)
Admission: RE | Admit: 2012-10-06 | Discharge: 2012-10-06 | Disposition: A | Discharge status: Home / Self Care | Payer: Medicare Other | Source: Ambulatory Visit | Attending: Internal Medicine | Admitting: Internal Medicine

## 2012-10-06 VITALS — BP 100/70 | HR 95 | Wt 146.0 lb

## 2012-10-06 DIAGNOSIS — E039 Hypothyroidism, unspecified: Secondary | ICD-10-CM | POA: Insufficient documentation

## 2012-10-06 DIAGNOSIS — I5030 Unspecified diastolic (congestive) heart failure: Secondary | ICD-10-CM | POA: Insufficient documentation

## 2012-10-06 DIAGNOSIS — I10 Essential (primary) hypertension: Secondary | ICD-10-CM | POA: Insufficient documentation

## 2012-10-06 DIAGNOSIS — I5033 Acute on chronic diastolic (congestive) heart failure: Secondary | ICD-10-CM

## 2012-10-06 DIAGNOSIS — I4891 Unspecified atrial fibrillation: Secondary | ICD-10-CM | POA: Insufficient documentation

## 2012-10-06 DIAGNOSIS — Z7901 Long term (current) use of anticoagulants: Secondary | ICD-10-CM | POA: Insufficient documentation

## 2012-10-06 DIAGNOSIS — I5032 Chronic diastolic (congestive) heart failure: Secondary | ICD-10-CM

## 2012-10-06 NOTE — Assessment & Plan Note (Signed)
Chronic. Rate controlled. Continue warfarin.

## 2012-10-06 NOTE — Assessment & Plan Note (Signed)
She is much improved but still with evidence of some R sided HF that is improving. Continue current regimen. Will repeat echo at next visit and if evidence of RV strain persists we will have to discuss whether or not she wants to proceed with RHC.

## 2012-10-06 NOTE — Progress Notes (Signed)
Patient ID: Kristina Diaz, female   DOB: 1930-03-06, 79 y.o.   MRN: [id number] Coumadin managed by Dr Arthur Holms PCP: Dr Arthur Holms HPI: Kristina Diaz is an 79 year old with a history of severe diastolic/R-sided HF, chronic AFib (on coumadin, failed DCCV in 12/13)), chronic LLE wound, HTN, Previous thrombus to LLE with subsequent thrombectomy and hypothyroidism.   Admitted to [facility] 08/04/12 from  Dr Waunita Schooner office with massive volume overload/RHF. Required lasix gtt for diuresis, diuresed 53 pounds.  Discharge weight 150 pounds.  Stay c/b by wound + for GNR started on cipro, later culture revealed staph aureus so started on doxycycline.    Echo 07/28/12 LVEF 55%. RV mild to moderately dilated/dysfunction with D-shaped septum. PA pressures not elevated by echo.   She returns for follow up with her husband. Denies SOB/PND/Orthopnea. She sleeps with HOB elevated due to back pain. Weight at home 147-151 pounds (still coming down slwoly).  Medications provided by staff at [facility]. Following low salt diet and limiting fluid intake to < 2 liters per day. Completing PT with [place].  Able to ambulate in her apartment. She is followed at the outpatient wound center for lower extremity wound. She and her husband live in Assisted Living at [facility]. Uses wheelchair out of the facility.    ROS: All systems negative except as listed in HPI, PMH and Problem List.  Past Medical History  Diagnosis Date  . PAD (peripheral artery disease) 2013    left leg. sounds like she had angio-plasty   . Atrial fibrillation 2013    s/p [facility] that failed. ON medication  . Hypertension   . Osteoporosis   . DVT (deep venous thrombosis) 2013    "LLE" (08/04/2012)  . Hypothyroidism   . History of blood transfusion 2013    "related to thrombectomy" (08/04/2012)  . Anemia     "slightly" (08/04/2012)  . Arthritis     "probably minor; knees, pinky" (08/04/2012)  . Fracture of L4 vertebra     "dx'd in 2013; can't treat til legs  fixed" (08/04/2012)    Current Outpatient Prescriptions  Medication Sig Dispense Refill  . acetaminophen (TYLENOL) 325 MG tablet Take by mouth every 8 (eight) hours as needed for pain.      . clotrimazole (LOTRIMIN) 1 % cream Apply 1 application topically 2 (two) times daily. Apply for 14 days.  Order date 06/10/2012.      . collagenase (SANTYL) ointment Apply 1 application topically daily. Being applied at wound care center, MWF by Home Health nurse      . levothyroxine (SYNTHROID, LEVOTHROID) 25 MCG tablet TAKE 1 AND 1/2 TABS (37.5MG ) BY MOUTH EVERY DAY*CHECK PULSE WEEKLY*  45 tablet  11  . liver oil-zinc oxide (DESITIN) 40 % ointment Apply 1 application topically as needed for dry skin.      . metoprolol succinate (TOPROL-XL) 25 MG 24 hr tablet Take 0.5 tablets (12.5 mg total) by mouth daily. Hold for SBP <110 and HR <60 and call MD.  30 tablet  11  . Multiple Vitamin (MULTIVITAMIN WITH MINERALS) TABS Take 1 tablet by mouth every morning.      Marland Kitchen oxyCODONE (OXY IR/ROXICODONE) 5 MG immediate release tablet Take 5 mg by mouth 4 (four) times daily as needed for pain.      Marland Kitchen oxyCODONE (OXYCONTIN) 10 MG 12 hr tablet Take 1 tablet (10 mg total) by mouth 2 (two) times daily.  60 tablet  0  . potassium chloride (K-DUR,KLOR-CON) 10 MEQ tablet  Take 1 tablet (10 mEq total) by mouth 2 (two) times daily.  30 tablet  1  . torsemide (DEMADEX) 20 MG tablet Take 1 tablet (20 mg total) by mouth 2 (two) times daily.  60 tablet  10  . warfarin (COUMADIN) 3 MG tablet Take 3 mg by mouth. On hold since 08/03/12      . nystatin (MYCOSTATIN) powder Apply topically 4 (four) times daily.  15 g  11   No current facility-administered medications for this encounter.     PHYSICAL EXAM: Filed Vitals:   10/06/12 1103  BP: 100/70  Pulse: 95  Weight: 146 lb (66.225 kg)  SpO2: 97%    General: Chronically ill appearing. Sitting in a wheelchair. No resp difficulty Husband present   HEENT: normal  Neck: supple. JVP 9.  Carotids 2+ bilat; no bruits. No lymphadenopathy or thryomegaly appreciated.  Cor: PMI nondisplaced. Irregular rate & rhythm. No rubs, gallops or murmurs.  Lungs: clear  Abdomen: soft, nontender, nondistended. No hepatosplenomegaly. No bruits or masses. Good bowel sounds.  Extremities: no cyanosis, clubbing, rash, RLE 1+ edema. LLE wrapped with Coban.     Neuro: alert & orientedx3, cranial nerves grossly intact. moves all 4 extremities w/o difficulty. Affect pleasant    ASSESSMENT & PLAN:

## 2012-10-07 ENCOUNTER — Other Ambulatory Visit: Payer: Self-pay | Admitting: Internal Medicine

## 2012-10-07 MED ORDER — OXYCODONE HCL 5 MG PO TABS: 5.0000 mg | ORAL_TABLET | Freq: Four times a day (QID) | ORAL | Status: DC | PRN

## 2012-10-07 MED ORDER — OXYCODONE HCL 10 MG PO TB12: 10.0000 mg | ORAL_TABLET | Freq: Two times a day (BID) | ORAL | Status: DC

## 2012-10-10 ENCOUNTER — Telehealth: Payer: Self-pay

## 2012-10-10 MED ORDER — OXYCODONE HCL 10 MG PO TB12: 10.0000 mg | ORAL_TABLET | Freq: Two times a day (BID) | ORAL | Status: DC

## 2012-10-10 NOTE — Telephone Encounter (Signed)
A user error has taken place: encounter opened in error, closed for administrative reasons.

## 2012-10-10 NOTE — Telephone Encounter (Signed)
Phone call from [employer] with [facility] [phone]. She states patient is in need of a new script for Oxycontin 10 mg BID. This should be faxed to [phone]. Script has been printed and waiting for Dr Debby Bud signature.

## 2012-10-14 ENCOUNTER — Telehealth: Payer: Self-pay

## 2012-10-14 NOTE — Telephone Encounter (Signed)
Phone call from Clydie Braun with [place] [facility] [place] [phone]. She states pt's BP is 100/50 and she did not receive her lopressor today.

## 2012-10-14 NOTE — Telephone Encounter (Signed)
Ok - pls hold Lopressor. OV w/Dr Norins next week Thx

## 2012-10-17 ENCOUNTER — Telehealth: Payer: Self-pay | Admitting: Internal Medicine

## 2012-10-17 NOTE — Telephone Encounter (Signed)
Phone call to [facility]. Was transferred several times. No one knew who I needed to speak with. I called and left a msg on her husbands phone #. Then called her son Molli Hazard who will call back to schedule patients appt regarding her BP being low.

## 2012-10-17 NOTE — Telephone Encounter (Signed)
Left voicemail for patient to return call.

## 2012-10-17 NOTE — Telephone Encounter (Signed)
How low is low?? She needs f/u OV

## 2012-10-17 NOTE — Telephone Encounter (Signed)
Kristina Diaz from [facility] left message that Kristina Diaz's BP is running low.  He would like to know if she should continue taking the BP med as prescribed.

## 2012-10-17 NOTE — Telephone Encounter (Signed)
I called [facility] to but was transferred several times. I called her son Molli Hazard to inform him patient needs a f/u appt due to BP being low. He said he will call back to schedule her appt.

## 2012-10-19 ENCOUNTER — Telehealth (HOSPITAL_COMMUNITY): Payer: Self-pay | Admitting: *Deleted

## 2012-10-19 ENCOUNTER — Ambulatory Visit (INDEPENDENT_AMBULATORY_CARE_PROVIDER_SITE_OTHER): Payer: Medicare Other | Admitting: General Practice

## 2012-10-19 DIAGNOSIS — I4891 Unspecified atrial fibrillation: Secondary | ICD-10-CM

## 2012-10-19 DIAGNOSIS — Z7901 Long term (current) use of anticoagulants: Secondary | ICD-10-CM

## 2012-10-19 DIAGNOSIS — I739 Peripheral vascular disease, unspecified: Secondary | ICD-10-CM

## 2012-10-19 NOTE — Telephone Encounter (Signed)
Marylene Land called concerned about pt's HR, she states it has been creeping up and is 121 today, she states that [facility] has been holding pt's Metoprolol for about 18 days because the order from Dr Kriste Basque said to hold if SBP >110 and they have been recording it as 90s/60s.  Marylene Land states that [facility] checks BP daily with a wrist cuff and have been getting it in the 90s, she states pt is asymptomatic and that when she checks BP she always gets it in 120s/60 range and today it was 120/58, she voices concern for accuracy of wrist cuff.  Discussed with Tonye Becket, NP she would like for pt to receive Metoprolol unless BP is >85, order faxed to Esperanza Richters, RN at [facility] (fax [phone]) and also left her VM with instructions and that order was being faxed, Marylene Land will continue to monitor

## 2012-10-20 ENCOUNTER — Telehealth: Payer: Self-pay | Admitting: General Practice

## 2012-10-20 NOTE — Telephone Encounter (Signed)
Message copied by Garrison Columbus on Thu Oct 20, 2012  1:42 PM ------      Message from: Illene Regulus E      Created: Thu Oct 20, 2012  1:03 PM       Thanks. Yep - this is one for the cardiologist to address: known a. Fib - complex patient.       ----- Message -----         From: Garrison Columbus, RN         Sent: 10/20/2012   8:51 AM           To: Jacques Navy, MD            Hi Dr. Debby Bud,            I just wanted to let you know that I spoke to patient's home health nurse and she states that patient's metoprolol has been held for that past 18 days due to low b/p's but that patient's HR was 121 and irregular.  Nurse did say that she was also going  to inform cardiology.            Cindy       ------

## 2012-10-24 ENCOUNTER — Telehealth: Payer: Self-pay | Admitting: *Deleted

## 2012-10-24 NOTE — Telephone Encounter (Signed)
Sorry to hear she has had trouble 1. Add to drug allergy list 2. For rash - she can try benadryl 25 mg 1 or 2 every 6 hours. Hold for drowsiness

## 2012-10-24 NOTE — Telephone Encounter (Signed)
Pt called states she is having a reaction to Bactrim DS.  This medication was prescribed by another MD and he has already been notified.  Pt just wanted to let you know that she is having a rash all over her body, fever of 101.5.

## 2012-10-24 NOTE — Telephone Encounter (Signed)
Called, left message on VM advising pt of Dr Debby Bud message.

## 2012-10-25 ENCOUNTER — Encounter (HOSPITAL_BASED_OUTPATIENT_CLINIC_OR_DEPARTMENT_OTHER): Payer: Medicare Other | Attending: General Surgery

## 2012-10-25 DIAGNOSIS — L89899 Pressure ulcer of other site, unspecified stage: Secondary | ICD-10-CM | POA: Insufficient documentation

## 2012-10-25 DIAGNOSIS — L8993 Pressure ulcer of unspecified site, stage 3: Secondary | ICD-10-CM | POA: Insufficient documentation

## 2012-10-26 ENCOUNTER — Ambulatory Visit (INDEPENDENT_AMBULATORY_CARE_PROVIDER_SITE_OTHER): Payer: Medicare Other | Admitting: General Practice

## 2012-10-26 DIAGNOSIS — Z7901 Long term (current) use of anticoagulants: Secondary | ICD-10-CM

## 2012-10-26 DIAGNOSIS — I739 Peripheral vascular disease, unspecified: Secondary | ICD-10-CM

## 2012-10-26 DIAGNOSIS — I4891 Unspecified atrial fibrillation: Secondary | ICD-10-CM

## 2012-10-26 LAB — POCT INR: INR: 2.5

## 2012-10-27 ENCOUNTER — Ambulatory Visit: Payer: Medicare Other | Admitting: Internal Medicine

## 2012-10-28 NOTE — Progress Notes (Signed)
Agree with change in DC recommendation.  Weldon Picking [facility] [phone] Beeper [phone]

## 2012-10-31 ENCOUNTER — Ambulatory Visit (INDEPENDENT_AMBULATORY_CARE_PROVIDER_SITE_OTHER): Payer: Medicare Other | Admitting: General Practice

## 2012-10-31 DIAGNOSIS — I4891 Unspecified atrial fibrillation: Secondary | ICD-10-CM

## 2012-10-31 DIAGNOSIS — I739 Peripheral vascular disease, unspecified: Secondary | ICD-10-CM

## 2012-10-31 DIAGNOSIS — Z7901 Long term (current) use of anticoagulants: Secondary | ICD-10-CM

## 2012-10-31 LAB — POCT INR: INR: 4.7

## 2012-11-01 ENCOUNTER — Encounter: Payer: Self-pay | Admitting: Internal Medicine

## 2012-11-01 ENCOUNTER — Ambulatory Visit (INDEPENDENT_AMBULATORY_CARE_PROVIDER_SITE_OTHER): Payer: Medicare Other | Admitting: Internal Medicine

## 2012-11-01 VITALS — BP 110/80 | HR 93 | Temp 97.5°F

## 2012-11-01 DIAGNOSIS — I5032 Chronic diastolic (congestive) heart failure: Secondary | ICD-10-CM

## 2012-11-01 DIAGNOSIS — I10 Essential (primary) hypertension: Secondary | ICD-10-CM

## 2012-11-01 MED ORDER — TORSEMIDE 20 MG PO TABS: 20.0000 mg | ORAL_TABLET | Freq: Two times a day (BID) | ORAL | Status: DC

## 2012-11-01 MED ORDER — METOPROLOL SUCCINATE ER 25 MG PO TB24: 12.5000 mg | ORAL_TABLET | Freq: Every day | ORAL | Status: DC

## 2012-11-01 NOTE — Assessment & Plan Note (Signed)
BP Readings from Last 3 Encounters:  11/01/12 110/80  10/06/12 100/70  09/06/12 120/70   She has been having low BP readings at facility   Plan Slowly reduce diuretic.

## 2012-11-01 NOTE — Progress Notes (Signed)
  Subjective:    Patient ID: Kristina Diaz, female    DOB: 1930/04/15, 79 y.o.   MRN: [id number]  HPI Kristina Diaz presents due to variability in BP with frequent low readings (SBP-90 or less). She has been asymptomatic but for these low readings her metoprolol is held. She has been doing well: no SOB, fluid has stayed off and weight is down to 140 lbs.   She reports that she had some fever Monday and Tuesday last week. Wed she had swelling of the dorsum left wrist which has resolved but she has diminished sensation in digits 1-3 left hand.  PMH, FamHx and SocHx reviewed for any changes and relevance. Current Outpatient Prescriptions on File Prior to Visit  Medication Sig Dispense Refill  . acetaminophen (TYLENOL) 325 MG tablet Take by mouth every 8 (eight) hours as needed for pain.      . clotrimazole (LOTRIMIN) 1 % cream Apply 1 application topically 2 (two) times daily. Apply for 14 days.  Order date 06/10/2012.      . collagenase (SANTYL) ointment Apply 1 application topically daily. Being applied at wound care center, MWF by Home Health nurse      . levothyroxine (SYNTHROID, LEVOTHROID) 25 MCG tablet TAKE 1 AND 1/2 TABS (37.5MG ) BY MOUTH EVERY DAY*CHECK PULSE WEEKLY*  45 tablet  11  . liver oil-zinc oxide (DESITIN) 40 % ointment Apply 1 application topically as needed for dry skin.      . metoprolol succinate (TOPROL-XL) 25 MG 24 hr tablet Take 0.5 tablets (12.5 mg total) by mouth daily. Hold for SBP <110 and HR <60 and call MD.  30 tablet  11  . Multiple Vitamin (MULTIVITAMIN WITH MINERALS) TABS Take 1 tablet by mouth every morning.      . nystatin (MYCOSTATIN) powder Apply topically 4 (four) times daily.  15 g  11  . oxyCODONE (OXY IR/ROXICODONE) 5 MG immediate release tablet Take 1 tablet (5 mg total) by mouth 4 (four) times daily as needed for pain.  30 tablet  0  . oxyCODONE (OXYCONTIN) 10 MG 12 hr tablet Take 1 tablet (10 mg total) by mouth 2 (two) times daily.  60 tablet  0  . potassium  chloride (K-DUR,KLOR-CON) 10 MEQ tablet Take 1 tablet (10 mEq total) by mouth 2 (two) times daily.  30 tablet  1  . warfarin (COUMADIN) 3 MG tablet Take 3 mg by mouth. On hold since 08/03/12       No current facility-administered medications on file prior to visit.      Review of Systems System review is negative for any constitutional, cardiac, pulmonary, GI or neuro symptoms or complaints other than as described in the HPI.     Objective:   Physical Exam Filed Vitals:   11/01/12 1138  BP: 110/80  Pulse: 93  Temp: 97.5 F (36.4 C)   Wt Readings from Last 3 Encounters:  10/06/12 146 lb (66.225 kg)  09/06/12 151 lb (68.493 kg)  08/18/12 149 lb 6.4 oz (67.767 kg)   Gen'l - w/c bound white woman in no distress HEENT - C&S clear Cor - IRIR rate controlled Pulm - no increased WOB, lungs CTAP Extremities - 1+ distal LE edema.       Assessment & Plan:

## 2012-11-01 NOTE — Patient Instructions (Addendum)
Congestive heart failure - doing GREAT. Weight is down, leg swelling is down and lungs are clear on exam.  Plan  Change demadex to 20 mg twice a day M-W-F and once a day T-Th-Sat-Sun  Low blood pressure - it is most likely not an issue with the metoprolol. Please take the low dose of 12.5 mg every day  The villian may be the diuretic - torsemide - that is leading to low blood pressure  Plan Change demadex as instructed above.  Watch the weight - call for a gain of 5 lbs or more in a week.  Keep appointment with CHF clinic August 14th.

## 2012-11-01 NOTE — Assessment & Plan Note (Addendum)
Patients heart failure and edema brought under good control with fairly high dose torsemide. Her weight is down to 140 lbs and she is having hypotensive episodes.  Plan Continue metoprolol at 12.5 mg daily  Change demadex to 20 mg twice a day M-W-F and once a day T-Th-Sat-Sun  Watch for weight gain, recurrent peripheral edema  For persistent episodes of hypotension consider further reduction.

## 2012-11-03 ENCOUNTER — Telehealth: Payer: Self-pay | Admitting: *Deleted

## 2012-11-03 NOTE — Telephone Encounter (Signed)
Kristina Diaz called requesting order to continue with home health.  Please advise

## 2012-11-03 NOTE — Telephone Encounter (Signed)
K

## 2012-11-07 ENCOUNTER — Ambulatory Visit (INDEPENDENT_AMBULATORY_CARE_PROVIDER_SITE_OTHER): Payer: Medicare Other | Admitting: General Practice

## 2012-11-07 DIAGNOSIS — Z7901 Long term (current) use of anticoagulants: Secondary | ICD-10-CM

## 2012-11-07 DIAGNOSIS — I739 Peripheral vascular disease, unspecified: Secondary | ICD-10-CM

## 2012-11-07 DIAGNOSIS — I4891 Unspecified atrial fibrillation: Secondary | ICD-10-CM

## 2012-11-09 ENCOUNTER — Telehealth: Payer: Self-pay | Admitting: Internal Medicine

## 2012-11-09 MED ORDER — OXYCODONE HCL 10 MG PO TB12: 10.0000 mg | ORAL_TABLET | Freq: Two times a day (BID) | ORAL | Status: DC

## 2012-11-09 NOTE — Telephone Encounter (Signed)
Script printed and waiting for signature.

## 2012-11-09 NOTE — Telephone Encounter (Signed)
Crystal called from [facility] stated that they need new rx for Oxycontin. Crystal stated that pt took her last pill today. Please advise.

## 2012-11-10 NOTE — Telephone Encounter (Signed)
Script has been faxed to [phone]

## 2012-11-14 ENCOUNTER — Telehealth: Payer: Self-pay | Admitting: *Deleted

## 2012-11-14 ENCOUNTER — Ambulatory Visit (INDEPENDENT_AMBULATORY_CARE_PROVIDER_SITE_OTHER): Payer: Medicare Other | Admitting: General Practice

## 2012-11-14 DIAGNOSIS — I739 Peripheral vascular disease, unspecified: Secondary | ICD-10-CM

## 2012-11-14 DIAGNOSIS — Z7901 Long term (current) use of anticoagulants: Secondary | ICD-10-CM

## 2012-11-14 DIAGNOSIS — I4891 Unspecified atrial fibrillation: Secondary | ICD-10-CM

## 2012-11-14 LAB — POCT INR: INR: 2.9

## 2012-11-14 NOTE — Telephone Encounter (Signed)
[facility] Triage Call Report Triage Record Num: [id number] Operator: Alphonsa Overall Patient Name: Kristina Diaz Call Date & Time: 11/09/2012 8:22:32PM Patient Phone: [phone] PCP: Illene Regulus Patient Gender: Female PCP Fax : [phone] Patient DOB: 06-Jul-1929 Practice Name: [facility] Reason for Call: Latina Craver, Care Manager, [facility] calling about [employer]. Routine medication for pt. Pt has run out of #60 pills given, but hard script never made it to facility. Pt doing same, no change in condition. Has PRN Oxycotin she can use. Facility to f/u with office 11/10/12 for hard script to be faxed. Protocol(s) Used: Medication Questions - Adult Recommended Outcome per Protocol: Speak with Provider or Pharmacist within 24 hours Reason for Outcome: Requests refill of prescribed medication with valid refills; lack of medications does not put patient at clinical risk Care Advice: ~ 07/

## 2012-11-15 ENCOUNTER — Telehealth: Payer: Self-pay | Admitting: General Practice

## 2012-11-15 NOTE — Telephone Encounter (Signed)
Outgoing fax to [place] - [phone].  Orders to check patient's INR 7/21, 7/28 and 11/28/2012.

## 2012-11-21 ENCOUNTER — Other Ambulatory Visit: Payer: Self-pay | Admitting: Internal Medicine

## 2012-11-21 ENCOUNTER — Ambulatory Visit (INDEPENDENT_AMBULATORY_CARE_PROVIDER_SITE_OTHER): Payer: Medicare Other | Admitting: General Practice

## 2012-11-21 DIAGNOSIS — Z7901 Long term (current) use of anticoagulants: Secondary | ICD-10-CM

## 2012-11-21 DIAGNOSIS — I739 Peripheral vascular disease, unspecified: Secondary | ICD-10-CM

## 2012-11-21 DIAGNOSIS — I4891 Unspecified atrial fibrillation: Secondary | ICD-10-CM

## 2012-11-21 LAB — POCT INR: INR: 1.9

## 2012-11-26 ENCOUNTER — Other Ambulatory Visit: Payer: Self-pay | Admitting: Internal Medicine

## 2012-11-28 ENCOUNTER — Ambulatory Visit (INDEPENDENT_AMBULATORY_CARE_PROVIDER_SITE_OTHER): Payer: Medicare Other | Admitting: General Practice

## 2012-11-28 DIAGNOSIS — I4891 Unspecified atrial fibrillation: Secondary | ICD-10-CM

## 2012-11-28 DIAGNOSIS — I739 Peripheral vascular disease, unspecified: Secondary | ICD-10-CM

## 2012-11-28 DIAGNOSIS — Z7901 Long term (current) use of anticoagulants: Secondary | ICD-10-CM

## 2012-11-29 ENCOUNTER — Encounter (HOSPITAL_BASED_OUTPATIENT_CLINIC_OR_DEPARTMENT_OTHER): Payer: Medicare Other | Attending: General Surgery

## 2012-11-29 DIAGNOSIS — L89609 Pressure ulcer of unspecified heel, unspecified stage: Secondary | ICD-10-CM | POA: Insufficient documentation

## 2012-11-29 DIAGNOSIS — L8993 Pressure ulcer of unspecified site, stage 3: Secondary | ICD-10-CM | POA: Insufficient documentation

## 2012-12-06 ENCOUNTER — Telehealth: Payer: Self-pay | Admitting: General Practice

## 2012-12-06 NOTE — Telephone Encounter (Signed)
Faxed new coumadin orders to [place].

## 2012-12-07 ENCOUNTER — Ambulatory Visit (INDEPENDENT_AMBULATORY_CARE_PROVIDER_SITE_OTHER): Payer: Medicare Other | Admitting: General Practice

## 2012-12-07 DIAGNOSIS — Z7901 Long term (current) use of anticoagulants: Secondary | ICD-10-CM

## 2012-12-07 DIAGNOSIS — I4891 Unspecified atrial fibrillation: Secondary | ICD-10-CM

## 2012-12-07 DIAGNOSIS — I739 Peripheral vascular disease, unspecified: Secondary | ICD-10-CM

## 2012-12-07 LAB — POCT INR: INR: 1.5

## 2012-12-08 ENCOUNTER — Encounter (HOSPITAL_COMMUNITY): Payer: Self-pay

## 2012-12-08 ENCOUNTER — Ambulatory Visit (HOSPITAL_COMMUNITY)
Admission: RE | Admit: 2012-12-08 | Discharge: 2012-12-08 | Disposition: A | Discharge status: Home / Self Care | Payer: Medicare Other | Source: Ambulatory Visit | Attending: Internal Medicine | Admitting: Internal Medicine

## 2012-12-08 ENCOUNTER — Ambulatory Visit (HOSPITAL_BASED_OUTPATIENT_CLINIC_OR_DEPARTMENT_OTHER)
Admission: RE | Admit: 2012-12-08 | Discharge: 2012-12-08 | Disposition: A | Discharge status: Home / Self Care | Payer: Medicare Other | Source: Ambulatory Visit | Attending: Internal Medicine | Admitting: Internal Medicine

## 2012-12-08 VITALS — BP 124/62 | HR 74 | Wt 137.4 lb

## 2012-12-08 DIAGNOSIS — R0600 Dyspnea, unspecified: Secondary | ICD-10-CM

## 2012-12-08 DIAGNOSIS — I5042 Chronic combined systolic (congestive) and diastolic (congestive) heart failure: Secondary | ICD-10-CM

## 2012-12-08 DIAGNOSIS — I059 Rheumatic mitral valve disease, unspecified: Secondary | ICD-10-CM | POA: Insufficient documentation

## 2012-12-08 DIAGNOSIS — I10 Essential (primary) hypertension: Secondary | ICD-10-CM | POA: Insufficient documentation

## 2012-12-08 DIAGNOSIS — I509 Heart failure, unspecified: Secondary | ICD-10-CM | POA: Insufficient documentation

## 2012-12-08 DIAGNOSIS — Z7901 Long term (current) use of anticoagulants: Secondary | ICD-10-CM

## 2012-12-08 DIAGNOSIS — I379 Nonrheumatic pulmonary valve disorder, unspecified: Secondary | ICD-10-CM

## 2012-12-08 DIAGNOSIS — I5032 Chronic diastolic (congestive) heart failure: Secondary | ICD-10-CM

## 2012-12-08 DIAGNOSIS — I50810 Right heart failure, unspecified: Secondary | ICD-10-CM

## 2012-12-08 DIAGNOSIS — I079 Rheumatic tricuspid valve disease, unspecified: Secondary | ICD-10-CM | POA: Insufficient documentation

## 2012-12-08 DIAGNOSIS — I359 Nonrheumatic aortic valve disorder, unspecified: Secondary | ICD-10-CM | POA: Insufficient documentation

## 2012-12-08 DIAGNOSIS — I4891 Unspecified atrial fibrillation: Secondary | ICD-10-CM | POA: Insufficient documentation

## 2012-12-08 LAB — BASIC METABOLIC PANEL
CO2: 33 mEq/L — ABNORMAL HIGH (ref 19–32)
Chloride: 97 mEq/L (ref 96–112)
GFR calc non Af Amer: 42 mL/min — ABNORMAL LOW (ref >90)
Glucose, Bld: 101 mg/dL — ABNORMAL HIGH (ref 70–99)
Potassium: 3.8 mEq/L (ref 3.5–5.1)
Sodium: 140 mEq/L (ref 135–145)

## 2012-12-10 DIAGNOSIS — I5042 Chronic combined systolic (congestive) and diastolic (congestive) heart failure: Secondary | ICD-10-CM | POA: Insufficient documentation

## 2012-12-10 NOTE — Progress Notes (Signed)
Patient ID: Kristina Diaz, female   DOB: 03-Aug-1929, 79 y.o.   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   Admitted to [facility] 08/04/12 from  Dr Waunita Schooner office with massive volume overload/RHF. Required lasix gtt for diuresis, diuresed 53 pounds.  Discharge weight 150 pounds.  Stay c/b by wound + for GNR and staph aureus treated with cipro/doxycycline.    Echo 07/28/12 LVEF 55%. RV mild to moderately dilated/dysfunction with D-shaped septum. PA pressures not elevated by echo.  Echo 12/08/12: LVEF hard to estimate due to septal dysfunction EF 35-40% range (read formally as 30-35%) RV markedly dilated. Mild TR.Mild to moderate RV dysfunction Peak RVSP ~40  Follow up: Saw Dr. Arthur Holms and was experiencing some hypotension and he cut her torsemide back to 40 mg M/W/Fri and 20 mg Tue/Thur/Sat/ Sun. Still at [facility], no longer participating in PT because they said they couldn't provide anything else as she was doing well. She is walking around with walker and doing exercises on her own. Medications provided by staff and they are holding Toprol for SBP < 85. Weight is down now to 137-140 pounds. Denies SOB, orthopnea or CP. Sleeps with HOB elevated d/t back pain. Following low salt diet and limiting fluid intake to < 2 liters per day.  She is followed at the outpatient wound center for lower extremity wound.  ROS: All systems negative except as listed in HPI, PMH and Problem List.  Past Medical History  Diagnosis Date  . PAD (peripheral artery disease) 2013    left leg. sounds like she had angio-plasty   . Atrial fibrillation 2013    s/p [facility] that failed. ON medication  . Hypertension   . Osteoporosis   . DVT  (deep venous thrombosis) 2013    "LLE" (08/04/2012)  . Hypothyroidism   . History of blood transfusion 2013    "related to thrombectomy" (08/04/2012)  . Anemia     "slightly" (08/04/2012)  . Arthritis     "probably minor; knees, pinky" (08/04/2012)  . Fracture of L4 vertebra     "dx'd in 2013; can't treat til legs fixed" (08/04/2012)    Current Outpatient Prescriptions  Medication Sig Dispense Refill  . levothyroxine (SYNTHROID, LEVOTHROID) 25 MCG tablet TAKE 1 AND 1/2 TABS (37.5MG ) BY MOUTH EVERY DAY*CHECK PULSE WEEKLY*  45 tablet  11  . metoprolol succinate (TOPROL-XL) 25 MG 24 hr tablet Take 0.5 tablets (12.5 mg total) by mouth daily.  30 tablet  11  . Multiple Vitamin (MULTIVITAMIN WITH MINERALS) TABS Take 1 tablet by mouth every morning.      Marland Kitchen oxyCODONE (OXYCONTIN) 10 MG 12 hr tablet Take 1 tablet (10 mg total) by mouth 2 (two) times daily.  60 tablet  0  . potassium chloride (K-DUR,KLOR-CON) 10 MEQ tablet Take 1 tablet (10 mEq total) by mouth 2 (two) times daily.  30 tablet  1  . torsemide (DEMADEX) 20 MG tablet Take 1 tablet (20 mg total) by mouth 2 (two) times daily. Monday-Weds- Friday. Take 1 tablet Tues-Thurs-Sat-Sun  60 tablet  10  . warfarin (COUMADIN) 3 MG tablet TAKE 1 TAB (3MG ) BY MOUTH EVERY SUNDAY MON TUES THURS AND FRI AND TAKE 1 AND 1/2 TABS (4.5MG ) BY MOUTH  EVERY WED AND SAT  16 tablet  10  . acetaminophen (TYLENOL) 325 MG tablet Take by mouth every 8 (eight) hours as needed for pain.      Marland Kitchen liver oil-zinc oxide (DESITIN) 40 % ointment Apply 1 application topically as needed for dry skin.      Marland Kitchen nystatin (MYCOSTATIN) powder Apply topically 4 (four) times daily.  15 g  11  . oxyCODONE (OXY IR/ROXICODONE) 5 MG immediate release tablet Take 1 tablet (5 mg total) by mouth 4 (four) times daily as needed for pain.  30 tablet  0   No current facility-administered medications for this encounter.   PHYSICAL EXAM: Filed Vitals:   12/08/12 1059  BP: 124/62  Pulse: 74  Weight:  137 lb 6.4 oz (62.324 kg)  SpO2: 100%   General: Chronically ill appearing. Sitting in a wheelchair. No resp difficulty Husband present   HEENT: normal  Neck: supple. JVP 9 with prominent C waves. Carotids 2+ bilat; no bruits. No lymphadenopathy or thryomegaly appreciated.  Cor: PMI nondisplaced. Irregular rate & rhythm. No rubs, gallops or murmurs. +TR,  Lungs: clear  Abdomen: soft, nontender, nondistended. No hepatosplenomegaly. No bruits or masses. Good bowel sounds.  Extremities: no cyanosis, clubbing, rash, LLE wrapped with Coban.     Neuro: alert & orientedx3, cranial nerves grossly intact. moves all 4 extremities w/o difficulty. Affect pleasant  ASSESSMENT & PLAN:  1) Chronic biventricular failure, EF 35- 40 %, RV moderately dilated and HK - NYHA II symptoms. Doing well. Fluid status slightly elevated however weight is continuing to come down. Will leave diuretics as is. - I have reviewed her echo in Clinic and she has moderate LV dysfunction with severe RV dilation and mild to moderate RV dysfunction. We discussed the role of cath to further evaluate including the r/b/a. She said that she is feeling the best she has felt now in a long time and was not a risk taker so she would like to defer the cath unless we though it would change her management dramatically. Given her age and comorbidities, I told her that cath was unlikely to change much at this ppoint and we have decided to continue medical therapy at this point an we can reconsdier if she gets worse. Will need VQ to exclude chronic PE. I think EF likely slightly >35% so will not refer for ICD at this time. Greater than 1 hour spent with encounter and > 50% of that time in direct discussions as above.  2) Afib - rate controlled. Will continue Toprol and coumadin. Discussed NOACs but she refuses at this point.   Sole Lengacher,MD 4:56 PM

## 2012-12-12 ENCOUNTER — Encounter: Payer: Self-pay | Admitting: Internal Medicine

## 2012-12-14 ENCOUNTER — Telehealth: Payer: Self-pay | Admitting: *Deleted

## 2012-12-14 ENCOUNTER — Ambulatory Visit (INDEPENDENT_AMBULATORY_CARE_PROVIDER_SITE_OTHER): Payer: Medicare Other | Admitting: Family Medicine

## 2012-12-14 DIAGNOSIS — Z7901 Long term (current) use of anticoagulants: Secondary | ICD-10-CM

## 2012-12-14 DIAGNOSIS — I739 Peripheral vascular disease, unspecified: Secondary | ICD-10-CM

## 2012-12-14 DIAGNOSIS — I4891 Unspecified atrial fibrillation: Secondary | ICD-10-CM

## 2012-12-14 LAB — POCT INR: INR: 1.6

## 2012-12-14 MED ORDER — OXYCODONE HCL 10 MG PO TB12: 10.0000 mg | ORAL_TABLET | Freq: Two times a day (BID) | ORAL | Status: DC

## 2012-12-14 NOTE — Telephone Encounter (Signed)
Scripts printed, signed, and faxed to [phone]. Hard copies on my desk.

## 2012-12-14 NOTE — Telephone Encounter (Signed)
Ok to prepare 30 day supply x 3 Rxs to be mailed

## 2012-12-14 NOTE — Telephone Encounter (Signed)
Crystal called requesting a Oxycontin Rx be faxed to facility at [phone].  Please advise

## 2012-12-15 ENCOUNTER — Ambulatory Visit (HOSPITAL_COMMUNITY)
Admission: RE | Admit: 2012-12-15 | Discharge: 2012-12-15 | Disposition: A | Discharge status: Home / Self Care | Payer: Medicare Other | Source: Ambulatory Visit | Attending: Internal Medicine | Admitting: Internal Medicine

## 2012-12-15 ENCOUNTER — Encounter (HOSPITAL_COMMUNITY)
Admission: RE | Admit: 2012-12-15 | Discharge: 2012-12-15 | Disposition: A | Discharge status: Home / Self Care | Payer: Medicare Other | Source: Ambulatory Visit | Attending: Internal Medicine | Admitting: Internal Medicine

## 2012-12-15 DIAGNOSIS — I5032 Chronic diastolic (congestive) heart failure: Secondary | ICD-10-CM | POA: Insufficient documentation

## 2012-12-15 DIAGNOSIS — I517 Cardiomegaly: Secondary | ICD-10-CM | POA: Insufficient documentation

## 2012-12-15 DIAGNOSIS — Z7901 Long term (current) use of anticoagulants: Secondary | ICD-10-CM

## 2012-12-15 DIAGNOSIS — R0600 Dyspnea, unspecified: Secondary | ICD-10-CM

## 2012-12-15 MED ORDER — TECHNETIUM TC 99M DIETHYLENETRIAME-PENTAACETIC ACID: 40.0000 | Freq: Once | INTRAVENOUS | Status: AC | PRN

## 2012-12-15 MED ORDER — TECHNETIUM TO 99M ALBUMIN AGGREGATED
6.0000 | Freq: Once | INTRAVENOUS | Status: AC | PRN
  Administered 2012-12-15: 6 via INTRAVENOUS

## 2012-12-21 ENCOUNTER — Ambulatory Visit: Payer: Self-pay | Admitting: General Practice

## 2012-12-21 ENCOUNTER — Telehealth: Payer: Self-pay

## 2012-12-21 DIAGNOSIS — Z7901 Long term (current) use of anticoagulants: Secondary | ICD-10-CM

## 2012-12-21 DIAGNOSIS — I4891 Unspecified atrial fibrillation: Secondary | ICD-10-CM

## 2012-12-21 DIAGNOSIS — I739 Peripheral vascular disease, unspecified: Secondary | ICD-10-CM

## 2012-12-21 LAB — POCT INR: INR: 1.8

## 2012-12-21 MED ORDER — HYDROCORTISONE 1 % EX LOTN: TOPICAL_LOTION | Freq: Two times a day (BID) | CUTANEOUS | Status: DC

## 2012-12-21 NOTE — Telephone Encounter (Signed)
Phone call from [employer], wellness nurse at [facility] [phone], states patient has a rash on her left forearm. She has previously had a prescription for Hydrocortisone 1% cream TID. Crystal is requesting a new order be faxed to [phone].

## 2012-12-21 NOTE — Telephone Encounter (Signed)
Rx on yur desk. Thanks

## 2012-12-21 NOTE — Telephone Encounter (Signed)
Phone call to Crystal letting her know it is okay to apply Hydrocortisone 1% cream. She states he need a written script faxed to [phone].

## 2012-12-21 NOTE — Telephone Encounter (Signed)
Ok for hydrocortisone 1% cream applied TID. If rash does not resolve in 2-3 days - will need ov

## 2012-12-27 ENCOUNTER — Encounter (HOSPITAL_BASED_OUTPATIENT_CLINIC_OR_DEPARTMENT_OTHER): Payer: Medicare Other | Attending: General Surgery

## 2012-12-27 DIAGNOSIS — L89899 Pressure ulcer of other site, unspecified stage: Secondary | ICD-10-CM | POA: Insufficient documentation

## 2012-12-27 DIAGNOSIS — L8993 Pressure ulcer of unspecified site, stage 3: Secondary | ICD-10-CM | POA: Insufficient documentation

## 2012-12-28 ENCOUNTER — Ambulatory Visit (INDEPENDENT_AMBULATORY_CARE_PROVIDER_SITE_OTHER): Payer: Medicare Other | Admitting: General Practice

## 2012-12-28 DIAGNOSIS — I739 Peripheral vascular disease, unspecified: Secondary | ICD-10-CM

## 2012-12-28 DIAGNOSIS — I4891 Unspecified atrial fibrillation: Secondary | ICD-10-CM

## 2012-12-28 DIAGNOSIS — Z7901 Long term (current) use of anticoagulants: Secondary | ICD-10-CM

## 2012-12-28 LAB — POCT INR: INR: 1.8

## 2013-01-04 ENCOUNTER — Ambulatory Visit (INDEPENDENT_AMBULATORY_CARE_PROVIDER_SITE_OTHER): Payer: Medicare Other | Admitting: General Practice

## 2013-01-04 DIAGNOSIS — I739 Peripheral vascular disease, unspecified: Secondary | ICD-10-CM

## 2013-01-04 DIAGNOSIS — I4891 Unspecified atrial fibrillation: Secondary | ICD-10-CM

## 2013-01-04 DIAGNOSIS — Z7901 Long term (current) use of anticoagulants: Secondary | ICD-10-CM

## 2013-01-10 ENCOUNTER — Ambulatory Visit (INDEPENDENT_AMBULATORY_CARE_PROVIDER_SITE_OTHER): Payer: Medicare Other | Admitting: General Practice

## 2013-01-10 DIAGNOSIS — Z7901 Long term (current) use of anticoagulants: Secondary | ICD-10-CM

## 2013-01-10 DIAGNOSIS — I4891 Unspecified atrial fibrillation: Secondary | ICD-10-CM

## 2013-01-10 DIAGNOSIS — I739 Peripheral vascular disease, unspecified: Secondary | ICD-10-CM

## 2013-01-10 LAB — PROTIME-INR: INR: 2 — AB (ref <=1.1)

## 2013-01-12 ENCOUNTER — Ambulatory Visit (HOSPITAL_COMMUNITY)
Admission: RE | Admit: 2013-01-12 | Discharge: 2013-01-12 | Disposition: A | Discharge status: Home / Self Care | Payer: Medicare Other | Source: Ambulatory Visit | Attending: Cardiology | Admitting: Cardiology

## 2013-01-12 ENCOUNTER — Telehealth: Payer: Self-pay | Admitting: *Deleted

## 2013-01-12 VITALS — BP 118/72 | HR 76 | Wt 134.0 lb

## 2013-01-12 DIAGNOSIS — I5042 Chronic combined systolic (congestive) and diastolic (congestive) heart failure: Secondary | ICD-10-CM

## 2013-01-12 DIAGNOSIS — I4891 Unspecified atrial fibrillation: Secondary | ICD-10-CM

## 2013-01-12 NOTE — Progress Notes (Signed)
Patient ID: Kristina Diaz, female   DOB: 1929-09-06, 79 y.o.   MRN: [id number]  Coumadin managed by Dr Arthur Holms PCP: Dr Arthur Holms  HPI: Kristina Diaz is a deligthful 79 year old with a history of severe diastolic/R-sided HF, chronic AFib (on coumadin, failed DCCV in 12/13)), chronic LLE wound with h/o revious thrombus to LLE with subsequent thrombectomy/fasciotomy, CRI (baseline Cr 1.5), HTN and hypothyroidism.   Admitted to [facility] 08/04/12 from  Dr Waunita Schooner office with massive volume overload/RHF. Required lasix gtt for diuresis, diuresed 53 pounds.  Discharge weight 150 pounds.  Stay c/b by wound + for GNR and staph aureus treated with cipro/doxycycline.    Echo 07/28/12 LVEF 55%. RV mild to moderately dilated/dysfunction with D-shaped septum. PA pressures not elevated by echo.  Echo 12/08/12: LVEF hard to estimate due to septal dysfunction EF 35-40% range (read formally as 30-35%) RV markedly dilated. Mild TR.Mild to moderate RV dysfunction Peak RVSP ~40  VQ 12/08/12 No PE  Labs 12/08/12 Potassium 3.8 Creatinine 1.18  She returns for follow up. Weight at home 135-138 pounds. She has not required any additional torsemide. She continues on torsemide 40 mg M/W/Fri and 20 mg Tue/Thur/Sat/ Sun. Overall she is feeling good.  Ambulates with walker in the apartment. Ongoing wound therapy to L achilles wound. Denies SOB. Following low salt diet and limiting fluid intake to < 2 liters per day.  She is followed at the outpatient wound center for lower extremity wound.  ROS: All systems negative except as listed in HPI, PMH and Problem List.  Past Medical History  Diagnosis Date  . PAD (peripheral artery disease) 2013    left leg. sounds like she had angio-plasty   . Atrial fibrillation 2013    s/p [facility] that failed. ON medication  . Hypertension   . Osteoporosis   . DVT (deep venous thrombosis) 2013    "LLE" (08/04/2012)  . Hypothyroidism   . History of blood transfusion 2013    "related to thrombectomy" (08/04/2012)   . Anemia     "slightly" (08/04/2012)  . Arthritis     "probably minor; knees, pinky" (08/04/2012)  . Fracture of L4 vertebra     "dx'd in 2013; can't treat til legs fixed" (08/04/2012)    Current Outpatient Prescriptions  Medication Sig Dispense Refill  . acetaminophen (TYLENOL) 325 MG tablet Take by mouth every 8 (eight) hours as needed for pain.      . diphenhydrAMINE (BENADRYL) 25 MG tablet Take 25 mg by mouth every 8 (eight) hours as needed for itching.      . docusate sodium (COLACE) 100 MG capsule Take 100 mg by mouth daily.      . hydrocortisone 1 % lotion Apply topically 2 (two) times daily.  118 mL  2  . levothyroxine (SYNTHROID, LEVOTHROID) 25 MCG tablet TAKE 1 AND 1/2 TABS (37.5MG ) BY MOUTH EVERY DAY*CHECK PULSE WEEKLY*  45 tablet  11  . liver oil-zinc oxide (DESITIN) 40 % ointment Apply 1 application topically as needed for dry skin.      . metoprolol succinate (TOPROL-XL) 25 MG 24 hr tablet Take 0.5 tablets (12.5 mg total) by mouth daily.  30 tablet  11  . Multiple Vitamin (MULTIVITAMIN WITH MINERALS) TABS Take 1 tablet by mouth every morning.      . nystatin (MYCOSTATIN) powder Apply topically 4 (four) times daily.  15 g  11  . oxyCODONE (OXY IR/ROXICODONE) 5 MG immediate release tablet Take 1 tablet (5 mg total) by mouth 4 (four)  times daily as needed for pain.  30 tablet  0  . oxyCODONE (OXYCONTIN) 10 MG 12 hr tablet Take 1 tablet (10 mg total) by mouth 2 (two) times daily. May fill on or after 02/13/13  60 tablet  0  . potassium chloride (K-DUR,KLOR-CON) 10 MEQ tablet Take 1 tablet (10 mEq total) by mouth 2 (two) times daily.  30 tablet  1  . torsemide (DEMADEX) 20 MG tablet Take 1 tablet (20 mg total) by mouth 2 (two) times daily. Monday-Weds- Friday. Take 1 tablet Tues-Thurs-Sat-Sun  60 tablet  10  . warfarin (COUMADIN) 3 MG tablet TAKE 1 TAB (3MG ) BY MOUTH EVERY SUNDAY MON TUES THURS AND FRI AND TAKE 1 AND 1/2 TABS (4.5MG ) BY MOUTH EVERY WED AND SAT  16 tablet  10   No  current facility-administered medications for this encounter.   PHYSICAL EXAM: Filed Vitals:   01/12/13 1332  BP: 118/72  Pulse: 76  Weight: 134 lb (60.782 kg)  SpO2: 99%   General: Chronically ill appearing. Sitting in a wheelchair. No resp difficulty Husband present   HEENT: normal  Neck: supple. JVP 7-8 with prominent C waves. Carotids 2+ bilat; no bruits. No lymphadenopathy or thryomegaly appreciated.  Cor: PMI nondisplaced. Irregular rate & rhythm. No rubs, gallops or murmurs. +TR,  Lungs: clear  Abdomen: soft, nontender, nondistended. No hepatosplenomegaly. No bruits or masses. Good bowel sounds.  Extremities: no cyanosis, clubbing, rash, LLE wrapped with Coban.     Neuro: alert & orientedx3, cranial nerves grossly intact. moves all 4 extremities w/o difficulty. Affect pleasant  ASSESSMENT & PLAN:  1) Chronic biventricular failure, EF 35- 40 %, RV moderately dilated and HK - NYHA II symptoms. Volume status stable.  Continue current diuretic regimen.  -Continue Torsemide  40 mg M/W/Fri and 20 mg Tue/Thur/Sat/ Sun. Continue Toprol XL 12.5 mg daily Next visit will consider adding Ace Reinforced daily weights.    2) Afib - rate controlled. Will continue Toprol and coumadin. Se declines NOACs.  Coumadin per PCP  Follow up in 2 months    Kristina Sawtelle NP-C 1:49 PM

## 2013-01-12 NOTE — Telephone Encounter (Signed)
Crystal from [facility] called requesting hard copy of Oxycotin 10mg  .  [phone] fax.  Please advise

## 2013-01-13 ENCOUNTER — Telehealth: Payer: Self-pay | Admitting: Internal Medicine

## 2013-01-13 ENCOUNTER — Telehealth: Payer: Self-pay

## 2013-01-13 NOTE — Telephone Encounter (Signed)
Pt request hard copy RX of Oxycodone 5 mg to be send into [facility] technician [phone], Phone # [phone]). Please advise

## 2013-01-13 NOTE — Telephone Encounter (Signed)
This form was faxed this morning.

## 2013-01-13 NOTE — Telephone Encounter (Signed)
On 12/14/12 three scripts were faxed to [phone] for Oxycontin 10 mg. I re-faxed the original fax to [employer] [phone]

## 2013-01-13 NOTE — Telephone Encounter (Signed)
Opened in error

## 2013-01-13 NOTE — Telephone Encounter (Signed)
Thought I signed a hard copy today but if not.Marland KitchenMarland KitchenMarland KitchenMarland Kitchenplease prepare. Thanks

## 2013-01-31 ENCOUNTER — Telehealth: Payer: Self-pay

## 2013-01-31 ENCOUNTER — Encounter (HOSPITAL_BASED_OUTPATIENT_CLINIC_OR_DEPARTMENT_OTHER): Payer: Medicare Other | Attending: General Surgery

## 2013-01-31 ENCOUNTER — Ambulatory Visit: Payer: Medicare Other | Admitting: Cardiovascular Disease

## 2013-01-31 DIAGNOSIS — R609 Edema, unspecified: Secondary | ICD-10-CM

## 2013-01-31 DIAGNOSIS — L8993 Pressure ulcer of unspecified site, stage 3: Secondary | ICD-10-CM | POA: Insufficient documentation

## 2013-01-31 DIAGNOSIS — L89609 Pressure ulcer of unspecified heel, unspecified stage: Secondary | ICD-10-CM | POA: Insufficient documentation

## 2013-01-31 NOTE — Telephone Encounter (Signed)
Weekly weight to monitor for fluid overload.

## 2013-01-31 NOTE — Telephone Encounter (Signed)
Phone call from Olar at [facility] [phone] says she saw where you want patient's weight monitored and to call if a weight gain of 5 pounds or more in a week. She is asking should this be a daily weight check or weekly? Please advise and fax order to [phone].

## 2013-01-31 NOTE — Telephone Encounter (Signed)
Order printed and faxed to [phone]

## 2013-02-07 ENCOUNTER — Ambulatory Visit (INDEPENDENT_AMBULATORY_CARE_PROVIDER_SITE_OTHER): Payer: Medicare Other | Admitting: General Practice

## 2013-02-07 DIAGNOSIS — I739 Peripheral vascular disease, unspecified: Secondary | ICD-10-CM

## 2013-02-07 DIAGNOSIS — I4891 Unspecified atrial fibrillation: Secondary | ICD-10-CM

## 2013-02-07 DIAGNOSIS — Z7901 Long term (current) use of anticoagulants: Secondary | ICD-10-CM

## 2013-02-23 ENCOUNTER — Ambulatory Visit (INDEPENDENT_AMBULATORY_CARE_PROVIDER_SITE_OTHER): Payer: Medicare Other | Admitting: Cardiovascular Disease

## 2013-02-23 ENCOUNTER — Encounter: Payer: Self-pay | Admitting: Cardiovascular Disease

## 2013-02-23 VITALS — BP 114/72 | HR 78 | Ht 60.5 in | Wt 130.0 lb

## 2013-02-23 DIAGNOSIS — L98499 Non-pressure chronic ulcer of skin of other sites with unspecified severity: Secondary | ICD-10-CM

## 2013-02-23 DIAGNOSIS — I70209 Unspecified atherosclerosis of native arteries of extremities, unspecified extremity: Secondary | ICD-10-CM

## 2013-02-23 DIAGNOSIS — I739 Peripheral vascular disease, unspecified: Secondary | ICD-10-CM

## 2013-02-23 NOTE — Assessment & Plan Note (Signed)
The patient was referred to me by Dr. Joanne Gavel for poorly a slowly healing left Achilles ulcer. She did have a heel ulcer which has subsequently healed. She is a history of biventricular heart failure and atrial fibrillation. She's had thrombosis in her left lower extremity and has had thrombectomy performed by vascular surgeon in [facility] March of 2013. That time she developed a pressures sore in her left leg during rehabilitation. Dr. Joanne Gavel has been managing her all ulcer at the [facility] wound care center. Lower extremity arterial Dopplers performed in our office/8/14 revealed no evidence of of obstructive disease.I suspect that these ulcers are venous stasis ulcers as well as pressure ulcers. Her Achilles ulcer is healing slowly and I do not think there is an arterial cause, I recommend continued aggressive local care.

## 2013-02-23 NOTE — Patient Instructions (Signed)
Dr Allyson Sabal will see you as needed.

## 2013-02-23 NOTE — Progress Notes (Signed)
02/23/2013 Kristina Diaz   05-26-1929  [id number]  Primary Physician Illene Regulus, MD Primary Cardiologist: Runell Gess MD Roseanne Reno   HPI:  Ms. Kristina Diaz is a delightful 79 year old married Caucasian female mother of 2 sons, grandmother to 3 grandchildren accompanied by her husband today. She was referred by Dr. Joanne Gavel from [facility] wound care center for vascular evaluation of a slowly healing left Achilles ulcer. Her primary care physician is Dr. Rosario Jacks and her cardiologist Dr. Nicholes Mango. She has a history of chronic atrial fibrillation on Coumadin anticoagulation as well as biventricular heart failure. She's had a left lower gemmae thrombectomy March 2013 by vascular surgeon in [place] and had extensive rehabilitation afterwards. She developed a pressures ulcer on her left leg. Arterial Dopplers performed in our office at the request of Dr. Wiliam Ke in April of this year showed normal arterial blood flow. She was hospitalized in May for a week because of iron overload and was diuresed. She had a heel ulcer which has since resolved and her Achilles ulcer is slowly resolving with aggressive local care. I believe her ulcer is related to venous stasis and arterial insufficiency..   Current Outpatient Prescriptions  Medication Sig Dispense Refill  . acetaminophen (TYLENOL) 325 MG tablet Take by mouth every 8 (eight) hours as needed for pain.      . diphenhydrAMINE (BENADRYL) 25 MG tablet Take 25 mg by mouth every 8 (eight) hours as needed for itching.      . docusate sodium (COLACE) 100 MG capsule Take 100 mg by mouth daily.      Marland Kitchen levothyroxine (SYNTHROID, LEVOTHROID) 25 MCG tablet TAKE 1 AND 1/2 TABS (37.5MG ) BY MOUTH EVERY DAY*CHECK PULSE WEEKLY*  45 tablet  11  . liver oil-zinc oxide (DESITIN) 40 % ointment Apply 1 application topically as needed for dry skin.      . metoprolol succinate (TOPROL-XL) 25 MG 24 hr tablet Take 0.5 tablets (12.5 mg total) by mouth  daily.  30 tablet  11  . Multiple Vitamin (MULTIVITAMIN WITH MINERALS) TABS Take 1 tablet by mouth every morning.      . nystatin (MYCOSTATIN) powder Apply topically 4 (four) times daily.  15 g  11  . oxyCODONE (OXY IR/ROXICODONE) 5 MG immediate release tablet Take 1 tablet (5 mg total) by mouth 4 (four) times daily as needed for pain.  30 tablet  0  . oxyCODONE (OXYCONTIN) 10 MG 12 hr tablet Take 1 tablet (10 mg total) by mouth 2 (two) times daily. May fill on or after 02/13/13  60 tablet  0  . potassium chloride (K-DUR,KLOR-CON) 10 MEQ tablet Take 1 tablet (10 mEq total) by mouth 2 (two) times daily.  30 tablet  1  . torsemide (DEMADEX) 20 MG tablet Take 1 tablet (20 mg total) by mouth 2 (two) times daily. Monday-Weds- Friday. Take 1 tablet Tues-Thurs-Sat-Sun  60 tablet  10  . warfarin (COUMADIN) 3 MG tablet TAKE 1 TAB (3MG ) BY MOUTH EVERY SUNDAY MON TUES THURS AND FRI AND TAKE 1 AND 1/2 TABS (4.5MG ) BY MOUTH EVERY WED AND SAT  16 tablet  10   No current facility-administered medications for this visit.    Allergies  Allergen Reactions  . Bactrim Ds [Sulfamethoxazole-Tmp Ds] Rash  . Tape Rash    History   Social History  . Marital Status: Married    Spouse Name: N/A    Number of Children: 2  . Years of Education: 40  Occupational History  . retired    Social History Main Topics  . Smoking status: Never Smoker   . Smokeless tobacco: Never Used  . Alcohol Use: 0.0 oz/week     Comment: 08/04/2012 "used to have 1-2 glasses of wine/wk; nothing for the past 1 yr"  . Drug Use: No  . Sexual Activity: No   Other Topics Concern  . Not on file   Social History Narrative   HSG. 1 year of college. Married: 1959: 2 sons - '63, '70: 3 grandchildren. Work - Animal nutritionist. Have been living with husband in [place]. ACP/Living will - HCPOA both sons. Recommended going to the[employer]versation [email or web address].                     Review of Systems: General: negative for chills,  fever, night sweats or weight changes.  Cardiovascular: negative for chest pain, dyspnea on exertion, edema, orthopnea, palpitations, paroxysmal nocturnal dyspnea or shortness of breath Dermatological: negative for rash Respiratory: negative for cough or wheezing Urologic: negative for hematuria Abdominal: negative for nausea, vomiting, diarrhea, bright red blood per rectum, melena, or hematemesis Neurologic: negative for visual changes, syncope, or dizziness All other systems reviewed and are otherwise negative except as noted above.    Blood pressure 114/72, pulse 78, height 5' 0.5" (1.537 m), weight 130 lb (58.968 kg).  General appearance: alert and no distress Neck: no adenopathy, no carotid bruit, no JVD, supple, symmetrical, trachea midline and thyroid not enlarged, symmetric, no tenderness/mass/nodules Lungs: clear to auscultation bilaterally Heart: irregularly irregular rhythm Abdomen: soft, non-tender; bowel sounds normal; no masses,  no organomegaly Extremities: venous stasis dermatitis noted and diminished pedal pulses Pulses: diminished pedal pulses  EKG not performed today  ASSESSMENT AND PLAN:   Atherosclerotic peripheral vascular disease with ulceration The patient was referred to me by Dr. Joanne Gavel for poorly a slowly healing left Achilles ulcer. She did have a heel ulcer which has subsequently healed. She is a history of biventricular heart failure and atrial fibrillation. She's had thrombosis in her left lower extremity and has had thrombectomy performed by vascular surgeon in [facility] March of 2013. That time she developed a pressures sore in her left leg during rehabilitation. Dr. Joanne Gavel has been managing her all ulcer at the [facility] wound care center. Lower extremity arterial Dopplers performed in our office/8/14 revealed no evidence of of obstructive disease.I suspect that these ulcers are venous stasis ulcers as well as pressure ulcers. Her Achilles ulcer is  healing slowly and I do not think there is an arterial cause, I recommend continued aggressive local care.      Runell Gess MD FACP,FACC,FAHA, [facility] 02/23/2013 12:31 PM

## 2013-02-28 ENCOUNTER — Encounter (HOSPITAL_BASED_OUTPATIENT_CLINIC_OR_DEPARTMENT_OTHER): Payer: Medicare Other | Attending: General Surgery

## 2013-02-28 DIAGNOSIS — L8993 Pressure ulcer of unspecified site, stage 3: Secondary | ICD-10-CM | POA: Insufficient documentation

## 2013-02-28 DIAGNOSIS — L89609 Pressure ulcer of unspecified heel, unspecified stage: Secondary | ICD-10-CM | POA: Insufficient documentation

## 2013-03-07 LAB — PROTIME-INR: INR: 2 — AB (ref 0.9–1.1)

## 2013-03-20 ENCOUNTER — Encounter: Payer: Self-pay | Admitting: Internal Medicine

## 2013-03-28 ENCOUNTER — Encounter (HOSPITAL_BASED_OUTPATIENT_CLINIC_OR_DEPARTMENT_OTHER): Payer: Medicare Other | Attending: General Surgery

## 2013-03-28 DIAGNOSIS — L8993 Pressure ulcer of unspecified site, stage 3: Secondary | ICD-10-CM | POA: Insufficient documentation

## 2013-03-28 DIAGNOSIS — L89899 Pressure ulcer of other site, unspecified stage: Secondary | ICD-10-CM | POA: Insufficient documentation

## 2013-03-29 ENCOUNTER — Telehealth: Payer: Self-pay | Admitting: General Practice

## 2013-03-29 NOTE — Telephone Encounter (Signed)
Faxed request to check INR this week (12/1).

## 2013-04-04 LAB — PROTIME-INR: INR: 1.7 — AB (ref <=1.1)

## 2013-04-06 ENCOUNTER — Encounter: Payer: Self-pay | Admitting: Internal Medicine

## 2013-04-07 ENCOUNTER — Ambulatory Visit (INDEPENDENT_AMBULATORY_CARE_PROVIDER_SITE_OTHER): Payer: Medicare Other | Admitting: General Practice

## 2013-04-07 NOTE — Progress Notes (Signed)
Pre-visit discussion using our clinic review tool. No additional management support is needed unless otherwise documented below in the visit note.  

## 2013-04-14 ENCOUNTER — Telehealth: Payer: Self-pay

## 2013-04-14 MED ORDER — OXYCODONE HCL ER 10 MG PO T12A: 10.0000 mg | EXTENDED_RELEASE_TABLET | Freq: Two times a day (BID) | ORAL | Status: DC

## 2013-04-14 NOTE — Telephone Encounter (Signed)
Phone call from [place] at [facility] [phone] states patient needs new Oxycontin script. She is completely out.

## 2013-05-02 ENCOUNTER — Encounter (HOSPITAL_BASED_OUTPATIENT_CLINIC_OR_DEPARTMENT_OTHER): Payer: Medicare Other | Attending: General Surgery

## 2013-05-02 DIAGNOSIS — L8993 Pressure ulcer of unspecified site, stage 3: Secondary | ICD-10-CM | POA: Insufficient documentation

## 2013-05-02 DIAGNOSIS — L89899 Pressure ulcer of other site, unspecified stage: Secondary | ICD-10-CM | POA: Insufficient documentation

## 2013-05-02 LAB — PROTIME-INR: INR: 1.9 — AB (ref <=1.1)

## 2013-05-03 ENCOUNTER — Ambulatory Visit (INDEPENDENT_AMBULATORY_CARE_PROVIDER_SITE_OTHER): Payer: Medicare Other | Admitting: General Practice

## 2013-05-03 NOTE — Progress Notes (Signed)
Pre-visit discussion using our clinic review tool. No additional management support is needed unless otherwise documented below in the visit note.  

## 2013-05-10 ENCOUNTER — Encounter: Payer: Self-pay | Admitting: Internal Medicine

## 2013-05-11 ENCOUNTER — Inpatient Hospital Stay (HOSPITAL_COMMUNITY): Admission: RE | Admit: 2013-05-11 | Payer: Medicare Other | Source: Ambulatory Visit

## 2013-05-18 ENCOUNTER — Encounter (HOSPITAL_COMMUNITY): Payer: Self-pay

## 2013-05-18 ENCOUNTER — Ambulatory Visit (HOSPITAL_COMMUNITY)
Admission: RE | Admit: 2013-05-18 | Discharge: 2013-05-18 | Disposition: A | Discharge status: Home / Self Care | Payer: Medicare Other | Source: Ambulatory Visit | Attending: Internal Medicine | Admitting: Internal Medicine

## 2013-05-18 VITALS — BP 126/62 | HR 73 | Resp 16 | Wt 133.0 lb

## 2013-05-18 DIAGNOSIS — I509 Heart failure, unspecified: Secondary | ICD-10-CM | POA: Insufficient documentation

## 2013-05-18 DIAGNOSIS — I5032 Chronic diastolic (congestive) heart failure: Secondary | ICD-10-CM

## 2013-05-18 DIAGNOSIS — I5042 Chronic combined systolic (congestive) and diastolic (congestive) heart failure: Secondary | ICD-10-CM

## 2013-05-18 DIAGNOSIS — I4891 Unspecified atrial fibrillation: Secondary | ICD-10-CM | POA: Insufficient documentation

## 2013-05-18 NOTE — Patient Instructions (Signed)
   Follow up in 4 months  Do the following things EVERYDAY: 1) Weigh yourself in the morning before breakfast. Write it down and keep it in a log. 2) Take your medicines as prescribed 3) Eat low salt foods-Limit salt (sodium) to 2000 mg per day.  4) Stay as active as you can everyday 5) Limit all fluids for the day to less than 2 liters 

## 2013-05-18 NOTE — Progress Notes (Signed)
Patient ID: Rigoberto NoelNancy Apt, female   DOB: 12/24/1929, 80 y.o.   MRN: [id number]  Coumadin managed by Dr Arthur HolmsNorrins PCP: Dr Arthur HolmsNorrins  HPI: Harriett Sineancy is a deligthful 80 year old with a history of severe diastolic/R-sided HF, chronic AFib (on coumadin, failed DCCV in 12/13)), chronic LLE wound with h/o revious thrombus to LLE with subsequent thrombectomy/fasciotomy, CRI (baseline Cr 1.5), HTN and hypothyroidism.   [facility] from  Dr Waunita Schoonerrenshaws office with massive volume overload/RHF. Required lasix gtt for diuresis, diuresed 53 pounds.  Discharge weight 150 pounds.  Stay c/b by wound + for GNR and staph aureus treated with cipro/doxycycline.    Echo 07/28/12 LVEF 55%. RV mild to moderately dilated/dysfunction with D-shaped septum. PA pressures not elevated by echo.  Echo 12/08/12: LVEF hard to estimate due to septal dysfunction EF 35-40% range (read formally as 30-35%) RV markedly dilated. Mild TR.Mild to moderate RV dysfunction Peak RVSP ~40  VQ 12/08/12 No PE  Labs 12/08/12 Potassium 3.8 Creatinine 1.18  She returns for follow up. Overall she is feeling great. She has ongoing back pain. Says she can do more things around her home. Denies SOB/PND/Orthopnea. Weight at home 130-133 pounds. Following low salt diet and limiting fluid intake to < 2 liters per day. Ambulating in the apartment with a walker. No bleeding problems.     ROS: All systems negative except as listed in HPI, PMH and Problem List.  Past Medical History  Diagnosis Date  . PAD (peripheral artery disease) 2013    left leg. sounds like she had angio-plasty   . Atrial fibrillation 2013    s/p [facility] that failed. ON medication  . Hypertension   . Osteoporosis   . DVT (deep venous thrombosis) 2013    "LLE" (08/04/2012)  . Hypothyroidism   . History of blood transfusion 2013    "related to thrombectomy" (08/04/2012)  . Anemia     "slightly" (08/04/2012)  . Arthritis     "probably minor; knees, pinky" (08/04/2012)  . Fracture of L4  vertebra     "dx'd in 2013; can't treat til legs fixed" (08/04/2012)  . Lower extremity ulceration   . Biventricular heart failure with reduced left ventricular function     Current Outpatient Prescriptions  Medication Sig Dispense Refill  . acetaminophen (TYLENOL) 325 MG tablet Take by mouth every 8 (eight) hours as needed for pain.      . cephALEXin (KEFLEX) 250 MG capsule Take 500 mg by mouth 2 (two) times daily.      . diphenhydrAMINE (BENADRYL) 25 MG tablet Take 25 mg by mouth every 8 (eight) hours as needed for itching.      . docusate sodium (COLACE) 100 MG capsule Take 100 mg by mouth daily.      Marland Kitchen. levothyroxine (SYNTHROID, LEVOTHROID) 25 MCG tablet TAKE 1 AND 1/2 TABS (37.5MG ) BY MOUTH EVERY DAY*CHECK PULSE WEEKLY*  45 tablet  11  . liver oil-zinc oxide (DESITIN) 40 % ointment Apply 1 application topically as needed for dry skin.      . metoprolol succinate (TOPROL-XL) 25 MG 24 hr tablet Take 0.5 tablets (12.5 mg total) by mouth daily.  30 tablet  11  . Multiple Vitamin (MULTIVITAMIN WITH MINERALS) TABS Take 1 tablet by mouth every morning.      . nystatin (MYCOSTATIN) powder Apply topically 4 (four) times daily.  15 g  11  . oxyCODONE (OXY IR/ROXICODONE) 5 MG immediate release tablet Take 1 tablet (5 mg total) by mouth 4 (  four) times daily as needed for pain.  30 tablet  0  . OxyCODONE (OXYCONTIN) 10 mg T12A 12 hr tablet Take 1 tablet (10 mg total) by mouth every 12 (twelve) hours.  60 tablet  0  . potassium chloride (K-DUR,KLOR-CON) 10 MEQ tablet Take 1 tablet (10 mEq total) by mouth 2 (two) times daily.  30 tablet  1  . torsemide (DEMADEX) 20 MG tablet Take 1 tablet (20 mg total) by mouth 2 (two) times daily. Monday-Weds- Friday. Take 1 tablet Tues-Thurs-Sat-Sun  60 tablet  10  . vitamin C (ASCORBIC ACID) 500 MG tablet Take 500 mg by mouth daily.      Marland Kitchen warfarin (COUMADIN) 3 MG tablet TAKE 1 TAB (3MG ) BY MOUTH EVERY SUNDAY MON TUES THURS AND FRI AND TAKE 1 AND 1/2 TABS (4.5MG ) BY  MOUTH EVERY WED AND SAT  16 tablet  10  . zinc sulfate 220 MG capsule Take 220 mg by mouth daily.       No current facility-administered medications for this encounter.   PHYSICAL EXAM: Filed Vitals:   05/18/13 1019  BP: 126/62  Pulse: 73  Resp: 16  Weight: 133 lb (60.328 kg)  SpO2: 97%   General: Chronically ill appearing. Sitting in a wheelchair. No resp difficulty Husband present   HEENT: normal  Neck: supple. JVP  5-6 with prominent waves. Carotids 2+ bilat; no bruits. No lymphadenopathy or thryomegaly appreciated.  Cor: PMI nondisplaced. Irregular rate & rhythm. No rubs, gallops or murmurs. +TR,  Lungs: clear  Abdomen: soft, nontender, nondistended. No hepatosplenomegaly. No bruits or masses. Good bowel sounds.  Extremities: no cyanosis, clubbing, rash, LLE erythema trace edema> RLE       Neuro: alert & orientedx3, cranial nerves grossly intact. moves all 4 extremities w/o difficulty. Affect pleasant  ASSESSMENT & PLAN:  1) Chronic biventricular failure, EF 35- 40 %, RV moderately dilated and HK - Overall she is doing great. NYHA II symptoms. Volume status stable.  Continue Torsemide  40 mg M/W/Fri and 20 mg Tue/Thur/Sat/ Sun. Continue Toprol XL 12.5 mg daily Would like to add Ace but she is prefers not add or change any medications.    2) Afib- rate controlled. Continue Toprol and coumadin. She declines NOACs.  Coumadin per PCP  Follow up in 4 months    CLEGG,AMY NP-C 10:26 AM

## 2013-05-30 LAB — PROTIME-INR: INR: 2.4 — AB (ref <=1.1)

## 2013-05-31 ENCOUNTER — Ambulatory Visit (INDEPENDENT_AMBULATORY_CARE_PROVIDER_SITE_OTHER): Payer: Medicare Other | Admitting: General Practice

## 2013-05-31 DIAGNOSIS — I4891 Unspecified atrial fibrillation: Secondary | ICD-10-CM

## 2013-05-31 DIAGNOSIS — Z5181 Encounter for therapeutic drug level monitoring: Secondary | ICD-10-CM | POA: Insufficient documentation

## 2013-05-31 NOTE — Progress Notes (Signed)
Pre-visit discussion using our clinic review tool. No additional management support is needed unless otherwise documented below in the visit note.  

## 2013-06-05 ENCOUNTER — Other Ambulatory Visit: Payer: Self-pay

## 2013-06-05 MED ORDER — OXYCODONE HCL ER 10 MG PO T12A: 10.0000 mg | EXTENDED_RELEASE_TABLET | Freq: Two times a day (BID) | ORAL | Status: DC

## 2013-06-05 NOTE — Telephone Encounter (Signed)
[facility] a [facility] that patient is in need of Oxycontin refill. These script have been mailed to patient.

## 2013-06-06 ENCOUNTER — Encounter (HOSPITAL_BASED_OUTPATIENT_CLINIC_OR_DEPARTMENT_OTHER): Payer: Medicare Other | Attending: General Surgery

## 2013-06-06 DIAGNOSIS — L8993 Pressure ulcer of unspecified site, stage 3: Secondary | ICD-10-CM | POA: Insufficient documentation

## 2013-06-06 DIAGNOSIS — L89899 Pressure ulcer of other site, unspecified stage: Secondary | ICD-10-CM | POA: Insufficient documentation

## 2013-06-08 ENCOUNTER — Telehealth: Payer: Self-pay | Admitting: *Deleted

## 2013-06-08 NOTE — Telephone Encounter (Signed)
Jeralene Petersrystal Harrison, RN Wellness Nurse at [facility] phoned requesting oxycontin order be faxed to them.  Per MAR review, it was printed 06/05/13, but doesn't say if it was faxed (and not in the script bin).  Please advise.   CB# [phone] [facility]([place])

## 2013-06-08 NOTE — Telephone Encounter (Signed)
I called back and spoke to Crystal. I let her know I mailed the scripts to the patient on 06/05/13. She said she will check with the patient.

## 2013-06-08 NOTE — Telephone Encounter (Signed)
Fax number at nursing facility [phone]

## 2013-06-27 ENCOUNTER — Encounter (HOSPITAL_BASED_OUTPATIENT_CLINIC_OR_DEPARTMENT_OTHER): Payer: Medicare Other | Attending: General Surgery

## 2013-06-27 DIAGNOSIS — L8992 Pressure ulcer of unspecified site, stage 2: Secondary | ICD-10-CM | POA: Insufficient documentation

## 2013-06-27 DIAGNOSIS — L89899 Pressure ulcer of other site, unspecified stage: Secondary | ICD-10-CM | POA: Insufficient documentation

## 2013-06-27 LAB — PROTIME-INR: INR: 1.7 — AB (ref <=1.1)

## 2013-07-03 ENCOUNTER — Telehealth: Payer: Self-pay | Admitting: Internal Medicine

## 2013-07-03 ENCOUNTER — Ambulatory Visit (INDEPENDENT_AMBULATORY_CARE_PROVIDER_SITE_OTHER): Payer: Medicare Other | Admitting: General Practice

## 2013-07-03 NOTE — Telephone Encounter (Signed)
[facility] is calling in regards to the patient's PT INR results they faxed over on March 3rd and 4th. They are waiting on a response back with new orders. Please send to Fax# [phone].

## 2013-07-03 NOTE — Telephone Encounter (Signed)
Left for coag team last week. Will forward to Lodema Pilotynthia Boyd for assistance

## 2013-07-03 NOTE — Progress Notes (Signed)
Pre visit review using our clinic review tool, if applicable. No additional management support is needed unless otherwise documented below in the visit note. 

## 2013-07-18 LAB — PROTIME-INR: INR: 2.5 — AB (ref <=1.1)

## 2013-07-21 ENCOUNTER — Ambulatory Visit (INDEPENDENT_AMBULATORY_CARE_PROVIDER_SITE_OTHER): Payer: Medicare Other | Admitting: General Practice

## 2013-07-21 DIAGNOSIS — Z5181 Encounter for therapeutic drug level monitoring: Secondary | ICD-10-CM

## 2013-07-21 DIAGNOSIS — I4891 Unspecified atrial fibrillation: Secondary | ICD-10-CM

## 2013-07-21 NOTE — Progress Notes (Signed)
Pre visit review using our clinic review tool, if applicable. No additional management support is needed unless otherwise documented below in the visit note. 

## 2013-08-01 ENCOUNTER — Encounter (HOSPITAL_BASED_OUTPATIENT_CLINIC_OR_DEPARTMENT_OTHER): Payer: Medicare Other | Attending: General Surgery

## 2013-08-01 DIAGNOSIS — L89899 Pressure ulcer of other site, unspecified stage: Secondary | ICD-10-CM | POA: Insufficient documentation

## 2013-08-01 DIAGNOSIS — L8992 Pressure ulcer of unspecified site, stage 2: Secondary | ICD-10-CM | POA: Insufficient documentation

## 2013-08-09 ENCOUNTER — Telehealth: Payer: Self-pay | Admitting: Internal Medicine

## 2013-08-09 MED ORDER — OXYCODONE HCL ER 10 MG PO T12A: 10.0000 mg | EXTENDED_RELEASE_TABLET | Freq: Two times a day (BID) | ORAL | Status: DC

## 2013-08-09 NOTE — Telephone Encounter (Signed)
Patient was not aware of med request.  Told patient she would have to find a new pcp.  She stated that [facility] would [facility] the ones to handle her scripts and finding her a new pcp.   [facility] told them that a one time only script will be completed by Dr. Yetta BarreJones.

## 2013-08-09 NOTE — Telephone Encounter (Signed)
Sorry, I no longer practice chronic pain management, so would need to find a PCP who does this, or seek pain management

## 2013-08-09 NOTE — Telephone Encounter (Signed)
Kristina Diaz [facility] requesting written RX for oxycontin 10 mg.  inform the pharmacy she might need an appt for this med refill due to Dr. Debby BudNorins retirement (pt does not have new PCP yet). Please help.

## 2013-08-09 NOTE — Telephone Encounter (Signed)
One time only from me Will need to find out who she has been assigned to for future prescriptions

## 2013-08-15 LAB — PROTIME-INR: INR: 2.3 — AB (ref 0.9–1.1)

## 2013-08-29 ENCOUNTER — Encounter (HOSPITAL_BASED_OUTPATIENT_CLINIC_OR_DEPARTMENT_OTHER): Payer: Medicare Other | Attending: General Surgery

## 2013-08-29 DIAGNOSIS — L8992 Pressure ulcer of unspecified site, stage 2: Secondary | ICD-10-CM | POA: Insufficient documentation

## 2013-08-29 DIAGNOSIS — L89899 Pressure ulcer of other site, unspecified stage: Secondary | ICD-10-CM | POA: Insufficient documentation

## 2013-09-07 ENCOUNTER — Encounter: Payer: Self-pay | Admitting: Internal Medicine

## 2013-09-07 ENCOUNTER — Telehealth: Payer: Self-pay | Admitting: *Deleted

## 2013-09-07 NOTE — Telephone Encounter (Signed)
Kristina Diaz from facility called requesting Oxycodone 10mg  refill.  Former pt of Dr Debby BudNorins.  Please advise

## 2013-09-07 NOTE — Telephone Encounter (Signed)
OK X 1 month This prescription will be refilled one time by  me; it will be necessary for her to establish with a primary care physician for additional refills. ( Note: I am not taking new patients at this time as I shall be retiring 04/28/15).

## 2013-09-08 ENCOUNTER — Other Ambulatory Visit: Payer: Self-pay | Admitting: *Deleted

## 2013-09-08 MED ORDER — OXYCODONE HCL ER 10 MG PO T12A: 10.0000 mg | EXTENDED_RELEASE_TABLET | Freq: Two times a day (BID) | ORAL | Status: DC

## 2013-09-08 NOTE — Telephone Encounter (Signed)
Rx faxed as requested.

## 2013-09-08 NOTE — Telephone Encounter (Signed)
Crystal called back with fax number for pt's rx..  Fax#: [phone]

## 2013-09-11 ENCOUNTER — Encounter (HOSPITAL_COMMUNITY): Payer: Self-pay | Admitting: Cardiology

## 2013-09-11 ENCOUNTER — Telehealth (HOSPITAL_COMMUNITY): Payer: Self-pay | Admitting: Cardiology

## 2013-09-11 NOTE — Telephone Encounter (Signed)
Attempting to schedule 4 month follow up in NP clinic I have been unable to reach this patient by phone.  A letter is being sent to the last known home address.

## 2013-09-12 LAB — PROTIME-INR: INR: 2.3 — AB (ref 0.9–1.1)

## 2013-09-22 ENCOUNTER — Telehealth: Payer: Self-pay | Admitting: Internal Medicine

## 2013-09-22 NOTE — Telephone Encounter (Signed)
Dr. Kirtland Bouchard approved a PT INR order, lab to be drawn 4 weeks from the last one.  Order was faxed to [place].

## 2013-09-22 NOTE — Telephone Encounter (Signed)
Krystal at [facility] needs to know when to draw pt's next PT INR??  She needs the order faxed to [phone].

## 2013-09-26 ENCOUNTER — Encounter (HOSPITAL_BASED_OUTPATIENT_CLINIC_OR_DEPARTMENT_OTHER): Payer: Medicare Other | Attending: General Surgery

## 2013-09-26 DIAGNOSIS — L89899 Pressure ulcer of other site, unspecified stage: Secondary | ICD-10-CM | POA: Insufficient documentation

## 2013-09-26 DIAGNOSIS — L8992 Pressure ulcer of unspecified site, stage 2: Secondary | ICD-10-CM | POA: Insufficient documentation

## 2013-09-27 ENCOUNTER — Ambulatory Visit (INDEPENDENT_AMBULATORY_CARE_PROVIDER_SITE_OTHER): Payer: Medicare Other | Admitting: General Practice

## 2013-09-27 ENCOUNTER — Telehealth: Payer: Self-pay | Admitting: General Practice

## 2013-09-27 DIAGNOSIS — I4891 Unspecified atrial fibrillation: Secondary | ICD-10-CM

## 2013-09-27 DIAGNOSIS — Z5181 Encounter for therapeutic drug level monitoring: Secondary | ICD-10-CM

## 2013-09-27 NOTE — Progress Notes (Signed)
Pre visit review using our clinic review tool, if applicable. No additional management support is needed unless otherwise documented below in the visit note. 

## 2013-09-27 NOTE — Telephone Encounter (Signed)
Faxed new orders to check INR to [facility] with instructions to fax results to Bailey Mech, RN @ [phone].

## 2013-10-09 ENCOUNTER — Telehealth: Payer: Self-pay

## 2013-10-09 ENCOUNTER — Telehealth: Payer: Self-pay | Admitting: Internal Medicine

## 2013-10-09 MED ORDER — OXYCODONE HCL 5 MG PO TABS: 5.0000 mg | ORAL_TABLET | Freq: Two times a day (BID) | ORAL | Status: DC

## 2013-10-09 NOTE — Telephone Encounter (Signed)
Pt's son called and stated that he was told that until his mother had a new pt appt with another provider, that his mother's medication would be refilled to last her until the visit. He states that he is being told that she cannot have her medication refilled and states that his mother has been without her medication for some time now.  He would like to speak to someone regarding this matter. Please advise

## 2013-10-09 NOTE — Telephone Encounter (Signed)
Rx ready for pick up.  Patient is aware and [facility].

## 2013-10-09 NOTE — Telephone Encounter (Signed)
Left message on machine for son to return our call.  [facility] was [facility] for someone to come pick up the prescription

## 2013-10-09 NOTE — Telephone Encounter (Signed)
Okay to fill? 

## 2013-10-09 NOTE — Telephone Encounter (Signed)
Pt was a pt of Dr Debby BudNorins and has a first time visit with Dr Amador CunasKwiatkowski on 10/17/13  She is req a rx on  oxyCODONE (OXY IR/ROXICODONE) 5 MG immediate release tablet  She called Dr Debby BudNorins office and they would not refill

## 2013-10-09 NOTE — Telephone Encounter (Signed)
Ok  #30 

## 2013-10-10 NOTE — Telephone Encounter (Signed)
Rx was picked up by [facility].

## 2013-10-12 ENCOUNTER — Encounter (HOSPITAL_COMMUNITY): Payer: Medicare Other

## 2013-10-12 ENCOUNTER — Telehealth: Payer: Self-pay | Admitting: Internal Medicine

## 2013-10-12 NOTE — Telephone Encounter (Signed)
Okay to order present Coumadin dosing

## 2013-10-12 NOTE — Telephone Encounter (Signed)
Please advise 

## 2013-10-12 NOTE — Telephone Encounter (Signed)
[facility] from [facility] called to ask if they can get orders for  Coumdin  She is new patient of Dr Kirtland BouchardK  Transfer from Dr Debby BudNorins  and her first appt is 10/17/13

## 2013-10-13 NOTE — Telephone Encounter (Signed)
Spoke to [place] regarding pt, told her to continue present coumadin orders and repeat PT/INR in 4 weeks from 6/3 which would be 7/1. Grover CanavanKrystal verbalized understanding and asked to fax order over. [facility].

## 2013-10-17 ENCOUNTER — Encounter: Payer: Self-pay | Admitting: Internal Medicine

## 2013-10-17 ENCOUNTER — Ambulatory Visit (INDEPENDENT_AMBULATORY_CARE_PROVIDER_SITE_OTHER): Payer: Medicare Other | Admitting: Internal Medicine

## 2013-10-17 VITALS — BP 110/68 | HR 83 | Temp 98.3°F | Resp 20 | Ht 62.5 in | Wt 129.0 lb

## 2013-10-17 DIAGNOSIS — I5033 Acute on chronic diastolic (congestive) heart failure: Secondary | ICD-10-CM

## 2013-10-17 DIAGNOSIS — I70209 Unspecified atherosclerosis of native arteries of extremities, unspecified extremity: Secondary | ICD-10-CM

## 2013-10-17 DIAGNOSIS — I5032 Chronic diastolic (congestive) heart failure: Secondary | ICD-10-CM

## 2013-10-17 DIAGNOSIS — E0789 Other specified disorders of thyroid: Secondary | ICD-10-CM

## 2013-10-17 DIAGNOSIS — E034 Atrophy of thyroid (acquired): Secondary | ICD-10-CM

## 2013-10-17 DIAGNOSIS — I4891 Unspecified atrial fibrillation: Secondary | ICD-10-CM

## 2013-10-17 DIAGNOSIS — E038 Other specified hypothyroidism: Secondary | ICD-10-CM

## 2013-10-17 DIAGNOSIS — E079 Disorder of thyroid, unspecified: Secondary | ICD-10-CM

## 2013-10-17 DIAGNOSIS — I509 Heart failure, unspecified: Secondary | ICD-10-CM

## 2013-10-17 DIAGNOSIS — I50810 Right heart failure, unspecified: Secondary | ICD-10-CM

## 2013-10-17 DIAGNOSIS — Z7901 Long term (current) use of anticoagulants: Secondary | ICD-10-CM

## 2013-10-17 DIAGNOSIS — I739 Peripheral vascular disease, unspecified: Secondary | ICD-10-CM

## 2013-10-17 DIAGNOSIS — L98499 Non-pressure chronic ulcer of skin of other sites with unspecified severity: Secondary | ICD-10-CM

## 2013-10-17 DIAGNOSIS — I482 Chronic atrial fibrillation, unspecified: Secondary | ICD-10-CM

## 2013-10-17 LAB — CBC WITH DIFFERENTIAL/PLATELET
BASOS PCT: 0.4 % (ref 0.0–3.0)
Basophils Absolute: 0 10*3/uL (ref 0.0–0.1)
EOS PCT: 2.3 % (ref 0.0–5.0)
Eosinophils Absolute: 0.1 10*3/uL (ref 0.0–0.7)
HCT: 36 % (ref 36.0–46.0)
Hemoglobin: 12.2 g/dL (ref 12.0–15.0)
LYMPHS PCT: 23.4 % (ref 12.0–46.0)
Lymphs Abs: 1 10*3/uL (ref 0.7–4.0)
MCHC: 33.8 g/dL (ref 30.0–36.0)
MCV: 97.7 fl (ref 78.0–100.0)
MONO ABS: 0.7 10*3/uL (ref 0.1–1.0)
MONOS PCT: 16.1 % — AB (ref 3.0–12.0)
NEUTROS PCT: 57.8 % (ref 43.0–77.0)
Neutro Abs: 2.4 10*3/uL (ref 1.4–7.7)
PLATELETS: 119 10*3/uL — AB (ref 150.0–400.0)
RBC: 3.69 Mil/uL — AB (ref 3.87–5.11)
RDW: 14.8 % (ref 11.5–15.5)
WBC: 4.2 10*3/uL (ref 4.0–10.5)

## 2013-10-17 LAB — COMPREHENSIVE METABOLIC PANEL
ALBUMIN: 4.1 g/dL (ref 3.5–5.2)
ALK PHOS: 78 U/L (ref 39–117)
ALT: 13 U/L (ref 0–35)
AST: 28 U/L (ref 0–37)
BUN: 29 mg/dL — AB (ref 6–23)
CALCIUM: 9.6 mg/dL (ref 8.4–10.5)
CO2: 31 meq/L (ref 19–32)
Chloride: 97 mEq/L (ref 96–112)
Creatinine, Ser: 1.1 mg/dL (ref 0.4–1.2)
GFR: 49.51 mL/min — AB (ref >60.00)
GLUCOSE: 93 mg/dL (ref 70–99)
POTASSIUM: 3.8 meq/L (ref 3.5–5.1)
SODIUM: 138 meq/L (ref 135–145)
TOTAL PROTEIN: 7.6 g/dL (ref 6.0–8.3)
Total Bilirubin: 1 mg/dL (ref 0.2–1.2)

## 2013-10-17 LAB — LIPID PANEL
CHOLESTEROL: 199 mg/dL (ref 0–200)
HDL: 107.1 mg/dL (ref >39.00)
LDL CALC: 86 mg/dL (ref 0–99)
NonHDL: 91.9
TRIGLYCERIDES: 31 mg/dL (ref 0.0–149.0)
Total CHOL/HDL Ratio: 2
VLDL: 6.2 mg/dL (ref 0.0–40.0)

## 2013-10-17 LAB — TSH: TSH: 0.38 u[IU]/mL (ref 0.35–4.50)

## 2013-10-17 NOTE — Patient Instructions (Signed)
Limit your sodium (Salt) intake  Return in 6 months for follow-up  

## 2013-10-17 NOTE — Progress Notes (Signed)
Subjective:    Patient ID: Kristina NoelNancy Diaz, female    DOB: 06/16/1929, 80 y.o.   MRN: [id number]  HPI  80 -year-old patient who is seen today to establish with this practice.  She is followed closely by cardiology with a history of combined systolic and diastolic heart failure.  She has PAD and permanent atrial fibrillation. Additionally, she has a history of hypertension, and a chronic skin ulceration involving her left ankle area.  She continues to be followed by the wound Center with slow but progressive healing. She has hypothyroidism and remains on chronic Coumadin anticoagulation.  Past medical history is also pertinent for a history of a L4 compression fracture. She is ambulatory, somewhat with a walker, but also uses a wheelchair primarily  Past Medical History  Diagnosis Date  . PAD (peripheral artery disease) 2013    left leg. sounds like she had angio-plasty   . Atrial fibrillation 2013    [facility]. ON medication  . Hypertension   . Osteoporosis   . DVT (deep venous thrombosis) 2013    "LLE" (08/04/2012)  . Hypothyroidism   . History of blood transfusion 2013    "related to thrombectomy" (08/04/2012)  . Anemia     "slightly" (08/04/2012)  . Arthritis     "probably minor; knees, pinky" (08/04/2012)  . Fracture of L4 vertebra     "dx'd in 2013; can't treat til legs fixed" (08/04/2012)  . Lower extremity ulceration   . Biventricular heart failure with reduced left ventricular function     History   Social History  . Marital Status: Married    Spouse Name: N/A    Number of Children: 2  . Years of Education: 13   Occupational History  . retired    Social History Main Topics  . Smoking status: Never Smoker   . Smokeless tobacco: Never Used  . Alcohol Use: 0.0 oz/week     Comment: 08/04/2012 "used to have 1-2 glasses of wine/wk; nothing for the past 1 yr"  . Drug Use: No  . Sexual Activity: No   Other Topics Concern  . Not on file   Social History  Narrative   HSG. 1 year of college. Married: 1959: 2 sons - '63, '70: 3 grandchildren. Work - Animal nutritionistoffice manager-retired. Have been living with [place] in [place]. ACP/Living will - HCPOA both sons. Recommended going to theconversation [email or web address].                    Past Surgical History  Procedure Laterality Date  . Thrombectomy Left 2013    "leg" (08/04/2012)  . G2p2    . Tonsillectomy and adenoidectomy  ~ 1938  . Dilation and curettage of uterus  1960's    Family History  Problem Relation Age of Onset  . Arthritis Mother   . Cancer Mother     breast cancer   . Cancer Brother     lung cancer  . Tuberculosis Father   . Cancer Sister     stg 4 breast cancer  . Diabetes Neg Hx   . Heart disease Neg Hx   . COPD Neg Hx     Allergies  Allergen Reactions  . Bactrim Ds [Sulfamethoxazole-Tmp Ds] Rash  . Tape Rash    Current Outpatient Prescriptions on File Prior to Visit  Medication Sig Dispense Refill  . acetaminophen (TYLENOL) 325 MG tablet Take by mouth every 8 (eight) hours as needed for pain.      .Marland Kitchen  diphenhydrAMINE (BENADRYL) 25 MG tablet Take 25 mg by mouth every 8 (eight) hours as needed for itching.      . docusate sodium (COLACE) 100 MG capsule Take 100 mg by mouth daily.      Marland Kitchen levothyroxine (SYNTHROID, LEVOTHROID) 25 MCG tablet TAKE 1 AND 1/2 TABS (37.5MG ) BY MOUTH EVERY DAY*CHECK PULSE WEEKLY*  45 tablet  11  . liver oil-zinc oxide (DESITIN) 40 % ointment Apply 1 application topically as needed for dry skin.      . metoprolol succinate (TOPROL-XL) 25 MG 24 hr tablet Take 0.5 tablets (12.5 mg total) by mouth daily.  30 tablet  11  . Multiple Vitamin (MULTIVITAMIN WITH MINERALS) TABS Take 1 tablet by mouth every morning.      . nystatin (MYCOSTATIN) powder Apply topically 4 (four) times daily.  15 g  11  . OxyCODONE (OXYCONTIN) 10 mg T12A 12 hr tablet Take 1 tablet (10 mg total) by mouth every 12 (twelve) hours.  60 tablet  0  . potassium chloride (K-DUR,KLOR-CON)  10 MEQ tablet Take 1 tablet (10 mEq total) by mouth 2 (two) times daily.  30 tablet  1  . torsemide (DEMADEX) 20 MG tablet Take 1 tablet (20 mg total) by mouth 2 (two) times daily. Monday-Weds- Friday. Take 1 tablet Tues-Thurs-Sat-Sun  60 tablet  10  . vitamin C (ASCORBIC ACID) 500 MG tablet Take 500 mg by mouth daily.      Marland Kitchen warfarin (COUMADIN) 3 MG tablet TAKE 1 TAB (3MG ) BY MOUTH EVERY SUNDAY MON TUES THURS AND FRI AND TAKE 1 AND 1/2 TABS (4.5MG ) BY MOUTH EVERY WED AND SAT  16 tablet  10  . zinc sulfate 220 MG capsule Take 220 mg by mouth daily.       No current facility-administered medications on file prior to visit.    BP 110/68  Pulse 83  Temp(Src) 98.3 F (36.8 C) (Oral)  Resp 20  Ht 5' 2.5" (1.588 m)  Wt 129 lb (58.514 kg)  BMI 23.20 kg/m2  SpO2 96%     Review of Systems  Constitutional: Negative.  Negative for fatigue.  HENT: Negative for congestion, dental problem, hearing loss, rhinorrhea, sinus pressure, sore throat and tinnitus.   Eyes: Negative for pain, discharge and visual disturbance.  Respiratory: Negative for cough and shortness of breath.   Cardiovascular: Negative for chest pain, palpitations and leg swelling.  Gastrointestinal: Negative for nausea, vomiting, abdominal pain, diarrhea, constipation, blood in stool and abdominal distention.  Genitourinary: Negative for dysuria, urgency, frequency, hematuria, flank pain, vaginal bleeding, vaginal discharge, difficulty urinating, vaginal pain and pelvic pain.  Musculoskeletal: Positive for arthralgias, back pain and gait problem. Negative for joint swelling.  Skin: Positive for wound. Negative for rash.  Neurological: Negative for dizziness, syncope, speech difficulty, weakness, numbness and headaches.  Hematological: Negative for adenopathy.  Psychiatric/Behavioral: Negative for behavioral problems, dysphoric mood and agitation. The patient is not nervous/anxious.        Objective:   Physical Exam    Constitutional: She is oriented to person, place, and time. She appears well-developed and well-nourished. No distress.  Blood pressure 110/70  HENT:  Head: Normocephalic.  Right Ear: External ear normal.  Left Ear: External ear normal.  Mouth/Throat: Oropharynx is clear and moist.  Eyes: Conjunctivae and EOM are normal. Pupils are equal, round, and reactive to light.  Neck: Normal range of motion. Neck supple. No thyromegaly present.  Cardiovascular: Normal rate, normal heart sounds and intact distal pulses.  Irregular rhythm with a controlled ventricular response Grade 3 over 6 systolic murmur  Pedal pulses were palpable in the right Left foot bandaged  Pulmonary/Chest: Effort normal and breath sounds normal.  Few crackles, right base  Abdominal: Soft. Bowel sounds are normal. She exhibits no mass. There is no tenderness.  Musculoskeletal: Normal range of motion.  Lymphadenopathy:    She has no cervical adenopathy.  Neurological: She is alert and oriented to person, place, and time.  Skin: Skin is warm and dry. No rash noted.  Stasis changes, right lower leg Left leg with a compression wrap  Chronic ulcer left Achilles area  Extensive scarring left leg from prior thrombectomy  Psychiatric: She has a normal mood and affect. Her behavior is normal.          Assessment & Plan:   Chronic combined systolic and diastolic heart failure.  Well compensated Hypertension Permanent atrial fibrillation Chronic Coumadin anticoagulation.  [facility] Hypothyroidism.  We'll check a TSH  Follow cardiology  Recheck in 6 months

## 2013-10-17 NOTE — Progress Notes (Signed)
Pre-visit discussion using our clinic review tool. No additional management support is needed unless otherwise documented below in the visit note.  

## 2013-10-23 ENCOUNTER — Telehealth: Payer: Self-pay

## 2013-10-23 NOTE — Telephone Encounter (Signed)
Crystal needs new hard script for Oxycodone 5 mg -bid.  Please fax over at [phone]

## 2013-10-24 LAB — PROTIME-INR

## 2013-10-24 MED ORDER — OXYCODONE HCL ER 10 MG PO T12A: 10.0000 mg | EXTENDED_RELEASE_TABLET | Freq: Two times a day (BID) | ORAL | Status: DC

## 2013-10-24 NOTE — Telephone Encounter (Signed)
Dr. Kirtland BouchardK, are you prescribing Oxycodone for pt?

## 2013-10-24 NOTE — Telephone Encounter (Signed)
[facility] calling to check status of request as patient will run out of meds today.

## 2013-10-24 NOTE — Telephone Encounter (Signed)
Rx printed and signed by Dr.K and [facility].

## 2013-10-25 ENCOUNTER — Telehealth: Payer: Self-pay | Admitting: *Deleted

## 2013-10-25 ENCOUNTER — Ambulatory Visit (INDEPENDENT_AMBULATORY_CARE_PROVIDER_SITE_OTHER): Payer: Medicare Other | Admitting: General Practice

## 2013-10-25 DIAGNOSIS — Z5181 Encounter for therapeutic drug level monitoring: Secondary | ICD-10-CM

## 2013-10-25 LAB — POCT INR: INR: 2.5

## 2013-10-25 NOTE — Progress Notes (Signed)
Pre visit review using our clinic review tool, if applicable. No additional management support is needed unless otherwise documented below in the visit note. 

## 2013-10-25 NOTE — Telephone Encounter (Signed)
Cindy, I received a fax with pt's PT: 27.0 and INR: 2.50 results from 6/30. I tried to call you but could not get through.

## 2013-10-26 ENCOUNTER — Encounter (HOSPITAL_COMMUNITY): Payer: Self-pay

## 2013-10-26 ENCOUNTER — Ambulatory Visit (HOSPITAL_COMMUNITY)
Admission: RE | Admit: 2013-10-26 | Discharge: 2013-10-26 | Disposition: A | Discharge status: Home / Self Care | Payer: Medicare Other | Source: Ambulatory Visit | Attending: Internal Medicine | Admitting: Internal Medicine

## 2013-10-26 VITALS — BP 122/71 | HR 71 | Resp 18 | Wt 127.5 lb

## 2013-10-26 DIAGNOSIS — I480 Paroxysmal atrial fibrillation: Secondary | ICD-10-CM

## 2013-10-26 DIAGNOSIS — I10 Essential (primary) hypertension: Secondary | ICD-10-CM | POA: Insufficient documentation

## 2013-10-26 DIAGNOSIS — I4891 Unspecified atrial fibrillation: Secondary | ICD-10-CM | POA: Diagnosis not present

## 2013-10-26 DIAGNOSIS — I5042 Chronic combined systolic (congestive) and diastolic (congestive) heart failure: Secondary | ICD-10-CM | POA: Insufficient documentation

## 2013-10-26 NOTE — Progress Notes (Signed)
Patient ID: Rigoberto NoelNancy Whitelock, female   DOB: 09/02/1929, 80 y.o.   MRN: [id number]  PCP: Dr. Amador CunasKwiatkowski (Manages INR)  HPI: Harriett Sineancy is a deligthful 80 year old with a history of severe diastolic/R-sided HF, chronic AFib (on coumadin, failed DCCV in 12/13), PAD, chronic LLE wound with h/o previous thrombus to LLE with subsequent thrombectomy/fasciotomy, CRI (baseline Cr 1.5), HTN and hypothyroidism.   [facility] from  Dr Waunita Schoonerrenshaws office with massive volume overload/RHF. Required lasix gtt for diuresis, diuresed 53 pounds.  Discharge weight 150 pounds.  Stay c/b by wound + for GNR and staph aureus treated with cipro/doxycycline.    Echo 07/28/12 LVEF 55%. RV mild to moderately dilated/dysfunction with D-shaped septum. PA pressures not elevated by echo.  Echo 12/08/12: LVEF hard to estimate due to septal dysfunction EF 35-40% range (read formally as 30-35%) RV markedly dilated. Mild TR.Mild to moderate RV dysfunction Peak RVSP ~40  VQ 12/08/12 No PE  Labs 12/08/12 Potassium 3.8 Creatinine 1.18  Follow up for heart failure: Doing well. [facility]. Weight at home 129-131 lbs. Denies SOB, PND or CP. Sleeps in hospital bed elevated d/t back issues. Keeps legs elevated. Has back pain with standing up. She is in the wheelchair most of time, but uses the walker around the apartment. Following a low salt diet and drinking less than 2L a day. No bleeding problems.   SH: [place] [facility]   ROS: All systems negative except as listed in HPI, PMH and Problem List.  Past Medical History  Diagnosis Date  . PAD (peripheral artery disease) 2013    left leg. sounds like she had angio-plasty   . Atrial fibrillation 2013    [facility]. ON medication  . Hypertension   . Osteoporosis   . DVT (deep venous thrombosis) 2013    "LLE" (08/04/2012)  . Hypothyroidism   . History of blood transfusion 2013    "related to thrombectomy" (08/04/2012)  .  Anemia     "slightly" (08/04/2012)  . Arthritis     "probably minor; knees, pinky" (08/04/2012)  . Fracture of L4 vertebra     "dx'd in 2013; can't treat til legs fixed" (08/04/2012)  . Lower extremity ulceration   . Biventricular heart failure with reduced left ventricular function     Current Outpatient Prescriptions  Medication Sig Dispense Refill  . acetaminophen (TYLENOL) 325 MG tablet Take by mouth every 8 (eight) hours as needed for pain.      . diphenhydrAMINE (BENADRYL) 25 MG tablet Take 25 mg by mouth every 8 (eight) hours as needed for itching.      . docusate sodium (COLACE) 100 MG capsule Take 100 mg by mouth daily.      Marland Kitchen. ENSURE PLUS (ENSURE PLUS) LIQD Take 237 mLs by mouth 2 (two) times daily between meals.      Marland Kitchen. levothyroxine (SYNTHROID, LEVOTHROID) 25 MCG tablet TAKE 1 AND 1/2 TABS (37.5MG ) BY MOUTH EVERY DAY*CHECK PULSE WEEKLY*  45 tablet  11  . liver oil-zinc oxide (DESITIN) 40 % ointment Apply 1 application topically as needed for dry skin.      . metoprolol succinate (TOPROL-XL) 25 MG 24 hr tablet Take 0.5 tablets (12.5 mg total) by mouth daily.  30 tablet  11  . Multiple Vitamin (MULTIVITAMIN WITH MINERALS) TABS Take 1 tablet by mouth every morning.      . nystatin (MYCOSTATIN) powder Apply topically 4 (four) times daily.  15 g  11  . OxyCODONE (OXYCONTIN) 10 mg T12A 12 hr tablet Take 10 mg by mouth every 12 (twelve) hours. [facility]      . potassium chloride (K-DUR,KLOR-CON) 10 MEQ tablet Take 1 tablet (10 mEq total) by mouth 2 (two) times daily.  30 tablet  1  . torsemide (DEMADEX) 20 MG tablet Take 1 tablet (20 mg total) by mouth 2 (two) times daily. Monday-Weds- Friday. Take 1 tablet Tues-Thurs-Sat-Sun  60 tablet  10  . vitamin C (ASCORBIC ACID) 500 MG tablet Take 500 mg by mouth daily.      Marland Kitchen. warfarin (COUMADIN) 3 MG tablet TAKE 1 TAB (3MG ) BY MOUTH EVERY SUNDAY MON TUES THURS AND FRI AND TAKE 1 AND 1/2 TABS (4.5MG ) BY MOUTH EVERY WED AND SAT  16  tablet  10  . zinc sulfate 220 MG capsule Take 220 mg by mouth daily.       No current facility-administered medications for this encounter.    Filed Vitals:   10/26/13 1107  BP: 122/71  Pulse: 71  Resp: 18  Weight: 127 lb 8 oz (57.834 kg)  SpO2: 97%    PHYSICAL EXAM: General: Chronically ill appearing. Sitting in a wheelchair. No resp difficulty Husband present   HEENT: normal  Neck: supple. JVP  5-6 with prominent waves. Carotids 2+ bilat; no bruits. No lymphadenopathy or thryomegaly appreciated.  Cor: PMI nondisplaced. Irregular rate & rhythm. No rubs, gallops or murmurs. +TR,  Lungs: clear  Abdomen: soft, nontender, nondistended. No hepatosplenomegaly. No bruits or masses. Good bowel sounds.  Extremities: no cyanosis, clubbing, rash, LLE erythema trace edema> RLE       Neuro: alert & orientedx3, cranial nerves grossly intact. moves all 4 extremities w/o difficulty. Affect pleasant  ASSESSMENT & PLAN:  1) Chronic biventricular failure: EF 35-40%, RV moderately dilated and HK (11/2012) - NYHA II symptoms and volume status stable. Weight has been very consistent at 129-131 lbs. Will continue torsemide 40 mg M/W/F and 20 mg Tue/Thur/Sat and Sunday. - Continue Toprol 12.5 mg daily.  - Discussed starting low dose ACE-I at bed time, however patient would like to hold off and not start anything new at this point. We also discussed repeating ECHO next visit, but she would also like to hold off on that as well. Told if she changes her mind to call us. -Reinforced the need and importance of daily weights, a low sodium diet, and fluid restriction (less than 2 L a day). Instructed to call the HF clinic if weight increases more than 3 lbs overnight or 5 lbs in a week.  2) Afib- rate controlled. Continue Toprol and coumadin. She declines NOACs.  Coumadin per PCP  F/U 6 months Ulla [place], Rainah Kirshner B NP-C 11:30 AM

## 2013-10-31 ENCOUNTER — Encounter (HOSPITAL_BASED_OUTPATIENT_CLINIC_OR_DEPARTMENT_OTHER): Payer: Medicare Other | Attending: General Surgery

## 2013-10-31 DIAGNOSIS — L89899 Pressure ulcer of other site, unspecified stage: Secondary | ICD-10-CM | POA: Insufficient documentation

## 2013-10-31 DIAGNOSIS — L8992 Pressure ulcer of unspecified site, stage 2: Secondary | ICD-10-CM | POA: Insufficient documentation

## 2013-11-06 ENCOUNTER — Encounter: Payer: Self-pay | Admitting: Internal Medicine

## 2013-11-07 DIAGNOSIS — L8992 Pressure ulcer of unspecified site, stage 2: Secondary | ICD-10-CM | POA: Diagnosis not present

## 2013-11-07 DIAGNOSIS — L89899 Pressure ulcer of other site, unspecified stage: Secondary | ICD-10-CM | POA: Diagnosis not present

## 2013-11-17 ENCOUNTER — Telehealth: Payer: Self-pay | Admitting: Internal Medicine

## 2013-11-17 NOTE — Telephone Encounter (Signed)
Pt request refill  OxyCODONE (OXYCONTIN) 10 mg T12A 12 hr tablet [phone] [facility] fax  and to [phone]  [facility]

## 2013-11-20 ENCOUNTER — Other Ambulatory Visit: Payer: Self-pay | Admitting: *Deleted

## 2013-11-20 MED ORDER — OXYCODONE HCL ER 10 MG PO T12A: 10.0000 mg | EXTENDED_RELEASE_TABLET | Freq: Two times a day (BID) | ORAL | Status: DC

## 2013-11-21 ENCOUNTER — Ambulatory Visit: Payer: Medicare Other | Admitting: General Practice

## 2013-11-21 DIAGNOSIS — Z5181 Encounter for therapeutic drug level monitoring: Secondary | ICD-10-CM

## 2013-11-21 LAB — PROTIME-INR: INR: 1.7 — AB (ref <=1.1)

## 2013-11-21 NOTE — Progress Notes (Signed)
Pre visit review using our clinic review tool, if applicable. No additional management support is needed unless otherwise documented below in the visit note. 

## 2013-12-12 ENCOUNTER — Encounter (HOSPITAL_BASED_OUTPATIENT_CLINIC_OR_DEPARTMENT_OTHER): Payer: Medicare Other | Attending: General Surgery

## 2013-12-12 DIAGNOSIS — L97909 Non-pressure chronic ulcer of unspecified part of unspecified lower leg with unspecified severity: Secondary | ICD-10-CM | POA: Insufficient documentation

## 2013-12-12 DIAGNOSIS — I87319 Chronic venous hypertension (idiopathic) with ulcer of unspecified lower extremity: Secondary | ICD-10-CM | POA: Diagnosis not present

## 2013-12-12 DIAGNOSIS — L89899 Pressure ulcer of other site, unspecified stage: Secondary | ICD-10-CM | POA: Diagnosis present

## 2013-12-12 DIAGNOSIS — L8992 Pressure ulcer of unspecified site, stage 2: Secondary | ICD-10-CM | POA: Diagnosis not present

## 2013-12-19 DIAGNOSIS — L97909 Non-pressure chronic ulcer of unspecified part of unspecified lower leg with unspecified severity: Secondary | ICD-10-CM | POA: Diagnosis not present

## 2013-12-19 DIAGNOSIS — L89899 Pressure ulcer of other site, unspecified stage: Secondary | ICD-10-CM | POA: Diagnosis not present

## 2013-12-19 DIAGNOSIS — L8992 Pressure ulcer of unspecified site, stage 2: Secondary | ICD-10-CM | POA: Diagnosis not present

## 2013-12-19 DIAGNOSIS — I87319 Chronic venous hypertension (idiopathic) with ulcer of unspecified lower extremity: Secondary | ICD-10-CM | POA: Diagnosis not present

## 2013-12-19 LAB — PROTIME-INR: INR: 2.4 — AB (ref 0.9–1.1)

## 2013-12-22 ENCOUNTER — Other Ambulatory Visit: Payer: Self-pay | Admitting: *Deleted

## 2013-12-22 MED ORDER — OXYCODONE HCL ER 10 MG PO T12A: 10.0000 mg | EXTENDED_RELEASE_TABLET | Freq: Two times a day (BID) | ORAL | Status: DC

## 2013-12-22 NOTE — Telephone Encounter (Signed)
Rx for Oxycontin faxed to [facility]

## 2013-12-26 ENCOUNTER — Encounter (HOSPITAL_BASED_OUTPATIENT_CLINIC_OR_DEPARTMENT_OTHER): Payer: Medicare Other | Attending: General Surgery

## 2013-12-26 ENCOUNTER — Ambulatory Visit: Payer: Medicare Other | Admitting: Family

## 2013-12-26 DIAGNOSIS — L89899 Pressure ulcer of other site, unspecified stage: Secondary | ICD-10-CM | POA: Insufficient documentation

## 2013-12-26 DIAGNOSIS — L8993 Pressure ulcer of unspecified site, stage 3: Secondary | ICD-10-CM | POA: Insufficient documentation

## 2013-12-26 DIAGNOSIS — I482 Chronic atrial fibrillation, unspecified: Secondary | ICD-10-CM

## 2013-12-26 DIAGNOSIS — Z5181 Encounter for therapeutic drug level monitoring: Secondary | ICD-10-CM

## 2013-12-26 LAB — POCT INR: INR: 2.4

## 2013-12-26 NOTE — Patient Instructions (Signed)
Please take 6 mg today (7/28) and then continue to take 4.5 mg all days except 6 mg on Wed/Sat.  Dosing instructions faxed to [facility] @ [phone]. Also called patient with instructions. Re-check in 4 weeks.  Anticoagulation Dose Instructions as of 12/26/2013     Kristina Diaz Tue Wed Thu Fri Sat   New Dose 4.5 mg 4.5 mg 4.5 mg 6 mg 4.5 mg 4.5 mg 6 mg    Description       Please take 6 mg today (7/28) and then continue to take 4.5 mg all days except 6 mg on Wed/Sat.  Dosing instructions faxed to [facility] @ [phone]. Also called patient with instructions. Re-check in 4 weeks.

## 2013-12-28 ENCOUNTER — Encounter: Payer: Self-pay | Admitting: Internal Medicine

## 2014-01-09 DIAGNOSIS — L89899 Pressure ulcer of other site, unspecified stage: Secondary | ICD-10-CM | POA: Diagnosis not present

## 2014-01-09 DIAGNOSIS — L8993 Pressure ulcer of unspecified site, stage 3: Secondary | ICD-10-CM | POA: Diagnosis not present

## 2014-01-19 ENCOUNTER — Other Ambulatory Visit: Payer: Self-pay | Admitting: *Deleted

## 2014-01-19 MED ORDER — OXYCODONE HCL ER 10 MG PO T12A: 10.0000 mg | EXTENDED_RELEASE_TABLET | Freq: Two times a day (BID) | ORAL | Status: DC

## 2014-01-19 NOTE — Telephone Encounter (Signed)
Rx Oxycontin faxed to [facility].

## 2014-01-19 NOTE — Telephone Encounter (Signed)
Fax number for [facility] is [phone]. Rx faxed.

## 2014-01-23 DIAGNOSIS — L89899 Pressure ulcer of other site, unspecified stage: Secondary | ICD-10-CM | POA: Diagnosis not present

## 2014-01-23 DIAGNOSIS — L8993 Pressure ulcer of unspecified site, stage 3: Secondary | ICD-10-CM | POA: Diagnosis not present

## 2014-01-23 LAB — PROTIME-INR: INR: 2.2 — AB (ref 0.9–1.1)

## 2014-01-25 ENCOUNTER — Telehealth: Payer: Self-pay | Admitting: Family

## 2014-01-25 ENCOUNTER — Ambulatory Visit (INDEPENDENT_AMBULATORY_CARE_PROVIDER_SITE_OTHER): Payer: Medicare Other | Admitting: Family

## 2014-01-25 DIAGNOSIS — I482 Chronic atrial fibrillation, unspecified: Secondary | ICD-10-CM

## 2014-01-25 DIAGNOSIS — Z5181 Encounter for therapeutic drug level monitoring: Secondary | ICD-10-CM

## 2014-01-25 LAB — POCT INR: INR: 2.2

## 2014-01-25 NOTE — Telephone Encounter (Signed)
Advise patient to recheck in 4 weeks. Continue current coumadin dosage.

## 2014-01-25 NOTE — Patient Instructions (Signed)
Continue to take 4.5 mg all days except 6 mg on Wed/Sat.  Dosing instructions faxed to [facility] @ [phone]. Also called patient with instructions. Re-check in 4 weeks.  Anticoagulation Dose Instructions as of 01/25/2014     Glynis SmilesSun Mon Tue Wed Thu Fri Sat   New Dose 4.5 mg 4.5 mg 4.5 mg 6 mg 4.5 mg 4.5 mg 6 mg    Description       Continue to take 4.5 mg all days except 6 mg on Wed/Sat.  Dosing instructions faxed to [facility] @ [phone]. Also called patient with instructions. Re-check in 4 weeks.

## 2014-01-29 NOTE — Telephone Encounter (Signed)
Pt aware.

## 2014-01-30 ENCOUNTER — Encounter (HOSPITAL_BASED_OUTPATIENT_CLINIC_OR_DEPARTMENT_OTHER): Payer: Medicare Other | Attending: General Surgery

## 2014-01-30 DIAGNOSIS — L89622 Pressure ulcer of left heel, stage 2: Secondary | ICD-10-CM | POA: Insufficient documentation

## 2014-01-31 ENCOUNTER — Encounter: Payer: Self-pay | Admitting: Family

## 2014-02-06 DIAGNOSIS — L89622 Pressure ulcer of left heel, stage 2: Secondary | ICD-10-CM | POA: Diagnosis present

## 2014-02-13 DIAGNOSIS — L89622 Pressure ulcer of left heel, stage 2: Secondary | ICD-10-CM | POA: Diagnosis not present

## 2014-02-20 DIAGNOSIS — L89622 Pressure ulcer of left heel, stage 2: Secondary | ICD-10-CM | POA: Diagnosis not present

## 2014-02-20 LAB — POCT INR: INR: 2.8

## 2014-02-21 ENCOUNTER — Ambulatory Visit (INDEPENDENT_AMBULATORY_CARE_PROVIDER_SITE_OTHER): Payer: Medicare Other | Admitting: Family

## 2014-02-21 DIAGNOSIS — I482 Chronic atrial fibrillation, unspecified: Secondary | ICD-10-CM

## 2014-02-21 DIAGNOSIS — Z5181 Encounter for therapeutic drug level monitoring: Secondary | ICD-10-CM

## 2014-02-27 ENCOUNTER — Encounter (HOSPITAL_BASED_OUTPATIENT_CLINIC_OR_DEPARTMENT_OTHER): Payer: Medicare Other | Attending: General Surgery

## 2014-02-27 DIAGNOSIS — L89892 Pressure ulcer of other site, stage 2: Secondary | ICD-10-CM | POA: Diagnosis present

## 2014-03-06 ENCOUNTER — Telehealth: Payer: Self-pay

## 2014-03-06 DIAGNOSIS — L89892 Pressure ulcer of other site, stage 2: Secondary | ICD-10-CM | POA: Diagnosis not present

## 2014-03-06 NOTE — Telephone Encounter (Signed)
Wound Center called. Pt will be taking cipro bid for 10 days. Next INR currently scheduled for 03/26/2014. Should pt be checked sooner?  Per Padonda, recheck in 1 week.  Spoke with pt and she advised the she has been taking the abx since last week Tuesday.   Call[facility] and left message to advise nursing that pt needs to have pt/inr checked today or tomorrow so that we may dose coumadin accordingly.  Awaiting a returned call

## 2014-03-20 ENCOUNTER — Ambulatory Visit (INDEPENDENT_AMBULATORY_CARE_PROVIDER_SITE_OTHER): Payer: Medicare Other | Admitting: Family

## 2014-03-20 ENCOUNTER — Telehealth: Payer: Self-pay | Admitting: Family

## 2014-03-20 DIAGNOSIS — I482 Chronic atrial fibrillation, unspecified: Secondary | ICD-10-CM

## 2014-03-20 DIAGNOSIS — Z5181 Encounter for therapeutic drug level monitoring: Secondary | ICD-10-CM

## 2014-03-20 DIAGNOSIS — L89892 Pressure ulcer of other site, stage 2: Secondary | ICD-10-CM | POA: Diagnosis not present

## 2014-03-20 LAB — POCT INR: INR: 2.9

## 2014-03-20 NOTE — Telephone Encounter (Signed)
Continue to take 4.5 mg all days except 6 mg on Wed/Sat.  Dosing instructions faxed to [facility] @ [phone]. Also called patient with instructions. Re-check in 4 weeks.

## 2014-03-20 NOTE — Telephone Encounter (Signed)
Order faxed.

## 2014-03-20 NOTE — Patient Instructions (Signed)
Continue to take 4.5 mg all days except 6 mg on Wed/Sat.  Dosing instructions faxed to [facility] @ [phone]. Also called patient with instructions. Re-check in 4 weeks.  Anticoagulation Dose Instructions as of 03/20/2014      Kristina SmilesSun Mon Tue Wed Thu Fri Sat   New Dose 4.5 mg 4.5 mg 4.5 mg 6 mg 4.5 mg 4.5 mg 6 mg    Description        Continue to take 4.5 mg all days except 6 mg on Wed/Sat.  Dosing instructions faxed to [facility] @ [phone]. Also called patient with instructions. Re-check in 4 weeks.

## 2014-03-21 ENCOUNTER — Other Ambulatory Visit: Payer: Self-pay | Admitting: Internal Medicine

## 2014-03-21 MED ORDER — OXYCODONE HCL ER 10 MG PO T12A: 10.0000 mg | EXTENDED_RELEASE_TABLET | Freq: Two times a day (BID) | ORAL | Status: DC

## 2014-03-21 NOTE — Telephone Encounter (Signed)
Pt needs a re-fill on OxyCODONE (OXYCONTIN) 10 mg T12A 12 hr tablet  [phone] [facility] Patient is out.

## 2014-03-21 NOTE — Telephone Encounter (Signed)
Ok x1 per Dr Artist PaisYoo, [facility]

## 2014-03-21 NOTE — Telephone Encounter (Signed)
Dr Kirtland BouchardK out for 3 weeks, [facility] if [facility].  Please advise

## 2014-04-03 ENCOUNTER — Encounter (HOSPITAL_BASED_OUTPATIENT_CLINIC_OR_DEPARTMENT_OTHER): Payer: Medicare Other | Attending: General Surgery

## 2014-04-03 DIAGNOSIS — L89892 Pressure ulcer of other site, stage 2: Secondary | ICD-10-CM | POA: Insufficient documentation

## 2014-04-10 DIAGNOSIS — L89892 Pressure ulcer of other site, stage 2: Secondary | ICD-10-CM | POA: Diagnosis present

## 2014-04-17 ENCOUNTER — Ambulatory Visit (INDEPENDENT_AMBULATORY_CARE_PROVIDER_SITE_OTHER): Payer: Medicare Other | Admitting: Family

## 2014-04-17 DIAGNOSIS — L89892 Pressure ulcer of other site, stage 2: Secondary | ICD-10-CM | POA: Diagnosis not present

## 2014-04-17 DIAGNOSIS — Z5181 Encounter for therapeutic drug level monitoring: Secondary | ICD-10-CM

## 2014-04-17 DIAGNOSIS — I482 Chronic atrial fibrillation, unspecified: Secondary | ICD-10-CM

## 2014-04-17 LAB — POCT INR: INR: 2.9

## 2014-04-17 NOTE — Patient Instructions (Signed)
Continue to take 4.5 mg all days except 6 mg on Wed/Sat.  Dosing instructions faxed to [facility] @ [phone]. Also called patient with instructions. Re-check in 4 weeks.  Anticoagulation Dose Instructions as of 04/17/2014      Glynis SmilesSun Mon Tue Wed Thu Fri Sat   New Dose 4.5 mg 4.5 mg 4.5 mg 6 mg 4.5 mg 4.5 mg 6 mg    Description        Continue to take 4.5 mg all days except 6 mg on Wed/Sat.  [facility]osing [facility] @ [phone]. Also called patient with instructions. Re-check in 4 weeks.

## 2014-04-18 ENCOUNTER — Other Ambulatory Visit: Payer: Self-pay | Admitting: *Deleted

## 2014-04-18 MED ORDER — OXYCODONE HCL ER 10 MG PO T12A: 10.0000 mg | EXTENDED_RELEASE_TABLET | Freq: Two times a day (BID) | ORAL | Status: DC

## 2014-04-18 NOTE — Telephone Encounter (Signed)
Received [facility] from [facility] pt needs refill on Oxycodone. [facility].

## 2014-04-24 ENCOUNTER — Other Ambulatory Visit (HOSPITAL_COMMUNITY): Payer: Self-pay | Admitting: Internal Medicine

## 2014-05-01 ENCOUNTER — Encounter (HOSPITAL_BASED_OUTPATIENT_CLINIC_OR_DEPARTMENT_OTHER): Payer: Medicare Other | Attending: General Surgery

## 2014-05-01 DIAGNOSIS — L89524 Pressure ulcer of left ankle, stage 4: Secondary | ICD-10-CM | POA: Insufficient documentation

## 2014-05-15 DIAGNOSIS — L89524 Pressure ulcer of left ankle, stage 4: Secondary | ICD-10-CM | POA: Diagnosis not present

## 2014-05-15 LAB — PROTIME-INR: INR: 2.8 — AB (ref <=1.1)

## 2014-05-17 ENCOUNTER — Encounter: Payer: Self-pay | Admitting: Internal Medicine

## 2014-05-22 ENCOUNTER — Other Ambulatory Visit: Payer: Self-pay | Admitting: *Deleted

## 2014-05-22 MED ORDER — OXYCODONE HCL ER 10 MG PO T12A: 10.0000 mg | EXTENDED_RELEASE_TABLET | Freq: Two times a day (BID) | ORAL | Status: DC

## 2014-05-29 ENCOUNTER — Encounter (HOSPITAL_BASED_OUTPATIENT_CLINIC_OR_DEPARTMENT_OTHER): Payer: Medicare Other | Attending: General Surgery

## 2014-05-29 DIAGNOSIS — L89522 Pressure ulcer of left ankle, stage 2: Secondary | ICD-10-CM | POA: Insufficient documentation

## 2014-06-05 ENCOUNTER — Ambulatory Visit (INDEPENDENT_AMBULATORY_CARE_PROVIDER_SITE_OTHER): Payer: Medicare Other | Admitting: General Practice

## 2014-06-05 DIAGNOSIS — Z5181 Encounter for therapeutic drug level monitoring: Secondary | ICD-10-CM

## 2014-06-05 NOTE — Progress Notes (Signed)
Pre visit review using our clinic review tool, if applicable. No additional management support is needed unless otherwise documented below in the visit note. 

## 2014-06-05 NOTE — Progress Notes (Signed)
Agree with plan 

## 2014-06-12 ENCOUNTER — Ambulatory Visit (INDEPENDENT_AMBULATORY_CARE_PROVIDER_SITE_OTHER): Payer: Medicare Other | Admitting: Family

## 2014-06-12 ENCOUNTER — Other Ambulatory Visit: Payer: Self-pay | Admitting: Family

## 2014-06-12 ENCOUNTER — Telehealth: Payer: Self-pay | Admitting: Family

## 2014-06-12 DIAGNOSIS — I482 Chronic atrial fibrillation, unspecified: Secondary | ICD-10-CM

## 2014-06-12 DIAGNOSIS — L89522 Pressure ulcer of left ankle, stage 2: Secondary | ICD-10-CM | POA: Diagnosis not present

## 2014-06-12 DIAGNOSIS — Z5181 Encounter for therapeutic drug level monitoring: Secondary | ICD-10-CM

## 2014-06-12 LAB — PROTIME-INR

## 2014-06-12 LAB — POCT INR: INR: 2.7

## 2014-06-12 NOTE — Patient Instructions (Signed)
Continue to take 4.5 mg all days except 6 mg on Wed/Sat.  Dosing instructions faxed to [facility] @ [phone]. Also called patient with instructions. Re-check in 4 weeks.  Anticoagulation Dose Instructions as of 06/12/2014      Glynis SmilesSun Mon Tue Wed Thu Fri Sat   New Dose 4.5 mg 4.5 mg 4.5 mg 6 mg 4.5 mg 4.5 mg 6 mg    Description        Continue to take 4.5 mg all days except 6 mg on Wed/Sat.  Dosing instructions [facility] to [facility] @ [phone]. Also called patient with instructions. Re-check in 4 weeks.

## 2014-06-12 NOTE — Telephone Encounter (Signed)
Pt aware of instructions and faxed to [facility]

## 2014-06-12 NOTE — Telephone Encounter (Signed)
Continue to take 4.5 mg all days except 6 mg on Wed/Sat.  Dosing instructions: Please fax to [facility] @ [phone]. Also called patient with instructions. Re-check in 4 weeks.

## 2014-06-18 ENCOUNTER — Encounter: Payer: Self-pay | Admitting: Internal Medicine

## 2014-06-21 ENCOUNTER — Other Ambulatory Visit: Payer: Self-pay | Admitting: *Deleted

## 2014-06-21 MED ORDER — OXYCODONE HCL ER 10 MG PO T12A: 10.0000 mg | EXTENDED_RELEASE_TABLET | Freq: Two times a day (BID) | ORAL | Status: DC

## 2014-06-21 NOTE — Telephone Encounter (Signed)
[facility] for [facility] on Oxycodone. [facility].

## 2014-06-21 NOTE — Telephone Encounter (Signed)
Received fa

## 2014-07-03 ENCOUNTER — Encounter (HOSPITAL_BASED_OUTPATIENT_CLINIC_OR_DEPARTMENT_OTHER): Payer: Medicare Other | Attending: General Surgery

## 2014-07-03 DIAGNOSIS — L89622 Pressure ulcer of left heel, stage 2: Secondary | ICD-10-CM | POA: Diagnosis not present

## 2014-07-10 ENCOUNTER — Ambulatory Visit (INDEPENDENT_AMBULATORY_CARE_PROVIDER_SITE_OTHER): Payer: Medicare Other | Admitting: General Practice

## 2014-07-10 ENCOUNTER — Telehealth: Payer: Self-pay | Admitting: Internal Medicine

## 2014-07-10 DIAGNOSIS — Z5181 Encounter for therapeutic drug level monitoring: Secondary | ICD-10-CM

## 2014-07-10 LAB — PROTIME-INR: INR: 3.4 — AB (ref <=1.1)

## 2014-07-10 NOTE — Progress Notes (Signed)
Pre visit review using our clinic review tool, if applicable. No additional management support is needed unless otherwise documented below in the visit note. 

## 2014-07-10 NOTE — Telephone Encounter (Signed)
I received from [facility] pt Protime results 34.0 and INR 3.36 and fax to cindy boyd at [phone]

## 2014-07-10 NOTE — Progress Notes (Signed)
Agree with plan 

## 2014-07-17 ENCOUNTER — Telehealth: Payer: Self-pay | Admitting: Internal Medicine

## 2014-07-17 ENCOUNTER — Telehealth: Payer: Self-pay | Admitting: General Practice

## 2014-07-17 NOTE — Telephone Encounter (Signed)
Needs call back as soon as possible in regards to patients INR.  Faxed INR over on the 15th but has not received new orders yet.   INR 3.36 Patient is taking 4.5mg  on Monday, Tuesday,Thursday, Friday and Sunday 6mg  on Wed and Sat

## 2014-07-17 NOTE — Telephone Encounter (Signed)
Coumadin orders faxed (2nd time).

## 2014-07-17 NOTE — Telephone Encounter (Signed)
Noted  

## 2014-07-17 NOTE — Telephone Encounter (Signed)
I received PT- 34.0 and Inr 3.36 [facility]. I faxed it to Bailey Mechindy Boyd at [phone].

## 2014-07-19 ENCOUNTER — Telehealth: Payer: Self-pay | Admitting: Internal Medicine

## 2014-07-19 MED ORDER — OXYCODONE HCL ER 10 MG PO T12A: 10.0000 mg | EXTENDED_RELEASE_TABLET | Freq: Two times a day (BID) | ORAL | Status: DC

## 2014-07-19 NOTE — Telephone Encounter (Signed)
Pt needs new rx oxycontin 10 mg #60 [facility] [phone]

## 2014-07-19 NOTE — Telephone Encounter (Signed)
There is an order for [facility]contin [facility] be [facility] in md folder

## 2014-07-24 DIAGNOSIS — L89622 Pressure ulcer of left heel, stage 2: Secondary | ICD-10-CM | POA: Diagnosis not present

## 2014-07-27 ENCOUNTER — Telehealth: Payer: Self-pay | Admitting: Internal Medicine

## 2014-07-27 NOTE — Telephone Encounter (Signed)
Please advise if okay to continue wound care?

## 2014-07-27 NOTE — Telephone Encounter (Signed)
Called Mervyn GayLora gave her verbal order to continue wound care for pt 2 x's a week. Lora verbalized understanding.

## 2014-07-27 NOTE — Telephone Encounter (Signed)
Lora needs re certification to continue to see pt 2 x week for wound care.  Pt is going to the wound care center,'verbal ok

## 2014-07-27 NOTE — Telephone Encounter (Signed)
ok 

## 2014-08-07 ENCOUNTER — Telehealth: Payer: Self-pay | Admitting: Internal Medicine

## 2014-08-07 ENCOUNTER — Ambulatory Visit: Payer: Self-pay | Admitting: General Practice

## 2014-08-07 ENCOUNTER — Ambulatory Visit (INDEPENDENT_AMBULATORY_CARE_PROVIDER_SITE_OTHER): Payer: Medicare Other | Admitting: General Practice

## 2014-08-07 ENCOUNTER — Encounter (HOSPITAL_BASED_OUTPATIENT_CLINIC_OR_DEPARTMENT_OTHER): Payer: Medicare Other | Attending: General Surgery

## 2014-08-07 DIAGNOSIS — Z5181 Encounter for therapeutic drug level monitoring: Secondary | ICD-10-CM

## 2014-08-07 DIAGNOSIS — L89612 Pressure ulcer of right heel, stage 2: Secondary | ICD-10-CM | POA: Insufficient documentation

## 2014-08-07 LAB — POCT INR: INR: 3.62

## 2014-08-07 NOTE — Progress Notes (Signed)
Pre visit review using our clinic review tool, if applicable. No additional management support is needed unless otherwise documented below in the visit note. 

## 2014-08-07 NOTE — Telephone Encounter (Signed)
I faxed patient's PT/INR results to you.

## 2014-08-07 NOTE — Progress Notes (Signed)
Agree with plan 

## 2014-08-14 DIAGNOSIS — L89612 Pressure ulcer of right heel, stage 2: Secondary | ICD-10-CM | POA: Diagnosis not present

## 2014-08-16 ENCOUNTER — Other Ambulatory Visit: Payer: Self-pay | Admitting: *Deleted

## 2014-08-16 MED ORDER — OXYCODONE HCL ER 10 MG PO T12A: 10.0000 mg | EXTENDED_RELEASE_TABLET | Freq: Two times a day (BID) | ORAL | Status: DC

## 2014-08-16 NOTE — Telephone Encounter (Signed)
[facility]  [facility] on Oxycontin 10 mg. Rx printed and signed by Dr. Tawanna Coolerodd and faxed.

## 2014-08-21 DIAGNOSIS — L89612 Pressure ulcer of right heel, stage 2: Secondary | ICD-10-CM | POA: Diagnosis not present

## 2014-08-28 ENCOUNTER — Encounter (HOSPITAL_BASED_OUTPATIENT_CLINIC_OR_DEPARTMENT_OTHER): Payer: Medicare Other

## 2014-09-04 ENCOUNTER — Encounter (HOSPITAL_BASED_OUTPATIENT_CLINIC_OR_DEPARTMENT_OTHER): Payer: Medicare Other | Attending: General Surgery

## 2014-09-04 DIAGNOSIS — I872 Venous insufficiency (chronic) (peripheral): Secondary | ICD-10-CM | POA: Diagnosis not present

## 2014-09-04 DIAGNOSIS — L97811 Non-pressure chronic ulcer of other part of right lower leg limited to breakdown of skin: Secondary | ICD-10-CM | POA: Insufficient documentation

## 2014-09-04 DIAGNOSIS — L89523 Pressure ulcer of left ankle, stage 3: Secondary | ICD-10-CM | POA: Insufficient documentation

## 2014-09-11 ENCOUNTER — Ambulatory Visit (INDEPENDENT_AMBULATORY_CARE_PROVIDER_SITE_OTHER): Payer: Medicare Other | Admitting: General Practice

## 2014-09-11 DIAGNOSIS — Z5181 Encounter for therapeutic drug level monitoring: Secondary | ICD-10-CM

## 2014-09-11 LAB — PROTIME-INR: INR: 2.9 — AB (ref <=1.1)

## 2014-09-11 NOTE — Progress Notes (Signed)
Pre visit review using our clinic review tool, if applicable. No additional management support is needed unless otherwise documented below in the visit note. 

## 2014-09-11 NOTE — Progress Notes (Signed)
Agree with plan 

## 2014-09-14 ENCOUNTER — Other Ambulatory Visit: Payer: Self-pay | Admitting: *Deleted

## 2014-09-14 MED ORDER — OXYCODONE HCL ER 10 MG PO T12A: 10.0000 mg | EXTENDED_RELEASE_TABLET | Freq: Two times a day (BID) | ORAL | Status: DC

## 2014-09-14 NOTE — Telephone Encounter (Signed)
Rx for O[facility]codone [facility].

## 2014-09-18 DIAGNOSIS — L97811 Non-pressure chronic ulcer of other part of right lower leg limited to breakdown of skin: Secondary | ICD-10-CM | POA: Diagnosis not present

## 2014-09-18 DIAGNOSIS — L89523 Pressure ulcer of left ankle, stage 3: Secondary | ICD-10-CM | POA: Diagnosis not present

## 2014-09-18 DIAGNOSIS — I872 Venous insufficiency (chronic) (peripheral): Secondary | ICD-10-CM | POA: Diagnosis not present

## 2014-09-25 DIAGNOSIS — L97811 Non-pressure chronic ulcer of other part of right lower leg limited to breakdown of skin: Secondary | ICD-10-CM | POA: Diagnosis not present

## 2014-09-25 DIAGNOSIS — L89523 Pressure ulcer of left ankle, stage 3: Secondary | ICD-10-CM | POA: Diagnosis not present

## 2014-09-25 DIAGNOSIS — I872 Venous insufficiency (chronic) (peripheral): Secondary | ICD-10-CM | POA: Diagnosis not present

## 2014-10-09 ENCOUNTER — Ambulatory Visit (INDEPENDENT_AMBULATORY_CARE_PROVIDER_SITE_OTHER): Payer: Medicare Other | Admitting: General Practice

## 2014-10-09 DIAGNOSIS — I4891 Unspecified atrial fibrillation: Secondary | ICD-10-CM

## 2014-10-09 DIAGNOSIS — Z5181 Encounter for therapeutic drug level monitoring: Secondary | ICD-10-CM

## 2014-10-09 LAB — PROTIME-INR: INR: 3.2 — AB (ref <=1.1)

## 2014-10-09 NOTE — Progress Notes (Signed)
Pre visit review using our clinic review tool, if applicable. No additional management support is needed unless otherwise documented below in the visit note. 

## 2014-10-10 NOTE — Progress Notes (Signed)
I have reviewed and agree with the plan. 

## 2014-10-16 ENCOUNTER — Encounter (HOSPITAL_BASED_OUTPATIENT_CLINIC_OR_DEPARTMENT_OTHER): Payer: Medicare Other | Attending: General Surgery

## 2014-10-16 DIAGNOSIS — Z86718 Personal history of other venous thrombosis and embolism: Secondary | ICD-10-CM | POA: Insufficient documentation

## 2014-10-16 DIAGNOSIS — L89622 Pressure ulcer of left heel, stage 2: Secondary | ICD-10-CM | POA: Insufficient documentation

## 2014-10-16 DIAGNOSIS — I10 Essential (primary) hypertension: Secondary | ICD-10-CM | POA: Insufficient documentation

## 2014-10-16 DIAGNOSIS — I499 Cardiac arrhythmia, unspecified: Secondary | ICD-10-CM | POA: Diagnosis not present

## 2014-10-16 DIAGNOSIS — I878 Other specified disorders of veins: Secondary | ICD-10-CM | POA: Diagnosis not present

## 2014-10-16 DIAGNOSIS — L97821 Non-pressure chronic ulcer of other part of left lower leg limited to breakdown of skin: Secondary | ICD-10-CM | POA: Diagnosis not present

## 2014-10-16 DIAGNOSIS — I739 Peripheral vascular disease, unspecified: Secondary | ICD-10-CM | POA: Insufficient documentation

## 2014-10-16 DIAGNOSIS — L97811 Non-pressure chronic ulcer of other part of right lower leg limited to breakdown of skin: Secondary | ICD-10-CM | POA: Insufficient documentation

## 2014-10-18 ENCOUNTER — Other Ambulatory Visit: Payer: Self-pay | Admitting: *Deleted

## 2014-10-18 MED ORDER — OXYCODONE HCL ER 10 MG PO T12A: 10.0000 mg | EXTENDED_RELEASE_TABLET | Freq: Two times a day (BID) | ORAL | Status: DC

## 2014-10-23 DIAGNOSIS — Z86718 Personal history of other venous thrombosis and embolism: Secondary | ICD-10-CM | POA: Diagnosis not present

## 2014-10-23 DIAGNOSIS — L89622 Pressure ulcer of left heel, stage 2: Secondary | ICD-10-CM | POA: Diagnosis not present

## 2014-10-23 DIAGNOSIS — L97821 Non-pressure chronic ulcer of other part of left lower leg limited to breakdown of skin: Secondary | ICD-10-CM | POA: Diagnosis not present

## 2014-10-23 DIAGNOSIS — L97811 Non-pressure chronic ulcer of other part of right lower leg limited to breakdown of skin: Secondary | ICD-10-CM | POA: Diagnosis not present

## 2014-10-30 ENCOUNTER — Encounter (HOSPITAL_BASED_OUTPATIENT_CLINIC_OR_DEPARTMENT_OTHER): Payer: Medicare Other | Attending: General Surgery

## 2014-10-30 DIAGNOSIS — I739 Peripheral vascular disease, unspecified: Secondary | ICD-10-CM | POA: Diagnosis not present

## 2014-10-30 DIAGNOSIS — L89622 Pressure ulcer of left heel, stage 2: Secondary | ICD-10-CM | POA: Insufficient documentation

## 2014-10-30 DIAGNOSIS — Z86718 Personal history of other venous thrombosis and embolism: Secondary | ICD-10-CM | POA: Insufficient documentation

## 2014-10-30 DIAGNOSIS — I87392 Chronic venous hypertension (idiopathic) with other complications of left lower extremity: Secondary | ICD-10-CM | POA: Insufficient documentation

## 2014-10-30 DIAGNOSIS — I87311 Chronic venous hypertension (idiopathic) with ulcer of right lower extremity: Secondary | ICD-10-CM | POA: Diagnosis not present

## 2014-10-30 DIAGNOSIS — I10 Essential (primary) hypertension: Secondary | ICD-10-CM | POA: Insufficient documentation

## 2014-11-06 ENCOUNTER — Ambulatory Visit (INDEPENDENT_AMBULATORY_CARE_PROVIDER_SITE_OTHER): Payer: Medicare Other | Admitting: General Practice

## 2014-11-06 DIAGNOSIS — I4891 Unspecified atrial fibrillation: Secondary | ICD-10-CM

## 2014-11-06 DIAGNOSIS — I87311 Chronic venous hypertension (idiopathic) with ulcer of right lower extremity: Secondary | ICD-10-CM | POA: Diagnosis not present

## 2014-11-06 DIAGNOSIS — Z5181 Encounter for therapeutic drug level monitoring: Secondary | ICD-10-CM

## 2014-11-06 DIAGNOSIS — I739 Peripheral vascular disease, unspecified: Secondary | ICD-10-CM | POA: Diagnosis not present

## 2014-11-06 DIAGNOSIS — I87392 Chronic venous hypertension (idiopathic) with other complications of left lower extremity: Secondary | ICD-10-CM | POA: Diagnosis not present

## 2014-11-06 DIAGNOSIS — L89622 Pressure ulcer of left heel, stage 2: Secondary | ICD-10-CM | POA: Diagnosis not present

## 2014-11-06 LAB — PROTIME-INR: INR: 3.4 — AB (ref <=1.1)

## 2014-11-06 NOTE — Progress Notes (Signed)
Pre visit review using our clinic review tool, if applicable. No additional management support is needed unless otherwise documented below in the visit note. 

## 2014-11-06 NOTE — Progress Notes (Signed)
I have reviewed and agree with the plan. 

## 2014-11-13 DIAGNOSIS — I87311 Chronic venous hypertension (idiopathic) with ulcer of right lower extremity: Secondary | ICD-10-CM | POA: Diagnosis not present

## 2014-11-13 DIAGNOSIS — I739 Peripheral vascular disease, unspecified: Secondary | ICD-10-CM | POA: Diagnosis not present

## 2014-11-13 DIAGNOSIS — I87392 Chronic venous hypertension (idiopathic) with other complications of left lower extremity: Secondary | ICD-10-CM | POA: Diagnosis not present

## 2014-11-13 DIAGNOSIS — L89622 Pressure ulcer of left heel, stage 2: Secondary | ICD-10-CM | POA: Diagnosis not present

## 2014-11-19 ENCOUNTER — Other Ambulatory Visit: Payer: Self-pay | Admitting: *Deleted

## 2014-11-19 MED ORDER — OXYCODONE HCL ER 10 MG PO T12A: 10.0000 mg | EXTENDED_RELEASE_TABLET | Freq: Two times a day (BID) | ORAL | Status: DC

## 2014-11-20 ENCOUNTER — Telehealth: Payer: Self-pay | Admitting: Internal Medicine

## 2014-11-20 DIAGNOSIS — I739 Peripheral vascular disease, unspecified: Secondary | ICD-10-CM | POA: Diagnosis not present

## 2014-11-20 DIAGNOSIS — I87311 Chronic venous hypertension (idiopathic) with ulcer of right lower extremity: Secondary | ICD-10-CM | POA: Diagnosis not present

## 2014-11-20 DIAGNOSIS — L89622 Pressure ulcer of left heel, stage 2: Secondary | ICD-10-CM | POA: Diagnosis not present

## 2014-11-20 DIAGNOSIS — I87392 Chronic venous hypertension (idiopathic) with other complications of left lower extremity: Secondary | ICD-10-CM | POA: Diagnosis not present

## 2014-11-20 NOTE — Telephone Encounter (Signed)
[facility] not have the rx OxyCODONE (OXYCONTIN) 10 mg T12A 12 hr tablet Can you refax?  Pt is completely out.

## 2014-11-20 NOTE — Telephone Encounter (Signed)
Rx faxed again to [facility].

## 2014-11-27 ENCOUNTER — Encounter (HOSPITAL_BASED_OUTPATIENT_CLINIC_OR_DEPARTMENT_OTHER): Payer: Medicare Other | Attending: General Surgery

## 2014-11-27 DIAGNOSIS — I499 Cardiac arrhythmia, unspecified: Secondary | ICD-10-CM | POA: Insufficient documentation

## 2014-11-27 DIAGNOSIS — Z872 Personal history of diseases of the skin and subcutaneous tissue: Secondary | ICD-10-CM | POA: Diagnosis not present

## 2014-11-27 DIAGNOSIS — I10 Essential (primary) hypertension: Secondary | ICD-10-CM | POA: Diagnosis not present

## 2014-11-27 DIAGNOSIS — L89622 Pressure ulcer of left heel, stage 2: Secondary | ICD-10-CM | POA: Diagnosis not present

## 2014-11-27 DIAGNOSIS — Z86718 Personal history of other venous thrombosis and embolism: Secondary | ICD-10-CM | POA: Diagnosis not present

## 2014-12-04 DIAGNOSIS — I10 Essential (primary) hypertension: Secondary | ICD-10-CM | POA: Diagnosis not present

## 2014-12-04 DIAGNOSIS — Z86718 Personal history of other venous thrombosis and embolism: Secondary | ICD-10-CM | POA: Diagnosis not present

## 2014-12-04 DIAGNOSIS — L89622 Pressure ulcer of left heel, stage 2: Secondary | ICD-10-CM | POA: Diagnosis not present

## 2014-12-04 DIAGNOSIS — I499 Cardiac arrhythmia, unspecified: Secondary | ICD-10-CM | POA: Diagnosis not present

## 2014-12-04 LAB — PROTIME-INR: INR: 2.6 — AB (ref <=1.1)

## 2014-12-05 ENCOUNTER — Ambulatory Visit (INDEPENDENT_AMBULATORY_CARE_PROVIDER_SITE_OTHER): Payer: Medicare Other | Admitting: General Practice

## 2014-12-05 DIAGNOSIS — I4891 Unspecified atrial fibrillation: Secondary | ICD-10-CM

## 2014-12-05 DIAGNOSIS — Z5181 Encounter for therapeutic drug level monitoring: Secondary | ICD-10-CM

## 2014-12-05 NOTE — Progress Notes (Signed)
I have reviewed and agree with the plan. 

## 2014-12-05 NOTE — Progress Notes (Signed)
Pre visit review using our clinic review tool, if applicable. No additional management support is needed unless otherwise documented below in the visit note. 

## 2014-12-11 DIAGNOSIS — L89622 Pressure ulcer of left heel, stage 2: Secondary | ICD-10-CM | POA: Diagnosis not present

## 2014-12-11 DIAGNOSIS — I10 Essential (primary) hypertension: Secondary | ICD-10-CM | POA: Diagnosis not present

## 2014-12-11 DIAGNOSIS — Z86718 Personal history of other venous thrombosis and embolism: Secondary | ICD-10-CM | POA: Diagnosis not present

## 2014-12-11 DIAGNOSIS — I499 Cardiac arrhythmia, unspecified: Secondary | ICD-10-CM | POA: Diagnosis not present

## 2014-12-18 ENCOUNTER — Other Ambulatory Visit: Payer: Self-pay | Admitting: *Deleted

## 2014-12-18 DIAGNOSIS — L89622 Pressure ulcer of left heel, stage 2: Secondary | ICD-10-CM | POA: Diagnosis not present

## 2014-12-18 DIAGNOSIS — Z86718 Personal history of other venous thrombosis and embolism: Secondary | ICD-10-CM | POA: Diagnosis not present

## 2014-12-18 DIAGNOSIS — I499 Cardiac arrhythmia, unspecified: Secondary | ICD-10-CM | POA: Diagnosis not present

## 2014-12-18 DIAGNOSIS — I10 Essential (primary) hypertension: Secondary | ICD-10-CM | POA: Diagnosis not present

## 2014-12-18 MED ORDER — OXYCODONE HCL ER 10 MG PO T12A: 10.0000 mg | EXTENDED_RELEASE_TABLET | Freq: Two times a day (BID) | ORAL | Status: DC

## 2014-12-18 NOTE — Telephone Encounter (Signed)
Received fax from [facility] requesting Oxycodone refill. Rx printed and signed and faxed to [facility] at [phone].

## 2014-12-25 DIAGNOSIS — I499 Cardiac arrhythmia, unspecified: Secondary | ICD-10-CM | POA: Diagnosis not present

## 2014-12-25 DIAGNOSIS — L89622 Pressure ulcer of left heel, stage 2: Secondary | ICD-10-CM | POA: Diagnosis not present

## 2014-12-25 DIAGNOSIS — I10 Essential (primary) hypertension: Secondary | ICD-10-CM | POA: Diagnosis not present

## 2014-12-25 DIAGNOSIS — Z86718 Personal history of other venous thrombosis and embolism: Secondary | ICD-10-CM | POA: Diagnosis not present

## 2015-01-01 ENCOUNTER — Ambulatory Visit (INDEPENDENT_AMBULATORY_CARE_PROVIDER_SITE_OTHER): Payer: Medicare Other | Admitting: General Practice

## 2015-01-01 DIAGNOSIS — Z5181 Encounter for therapeutic drug level monitoring: Secondary | ICD-10-CM

## 2015-01-01 DIAGNOSIS — I4891 Unspecified atrial fibrillation: Secondary | ICD-10-CM

## 2015-01-01 LAB — PROTIME-INR: INR: 2.9 — AB (ref <=1.1)

## 2015-01-01 NOTE — Progress Notes (Signed)
Pre visit review using our clinic review tool, if applicable. No additional management support is needed unless otherwise documented below in the visit note. 

## 2015-01-01 NOTE — Progress Notes (Signed)
I have reviewed and agree with the plan. 

## 2015-01-08 ENCOUNTER — Encounter (HOSPITAL_BASED_OUTPATIENT_CLINIC_OR_DEPARTMENT_OTHER): Payer: Medicare Other | Attending: General Surgery

## 2015-01-08 DIAGNOSIS — L89622 Pressure ulcer of left heel, stage 2: Secondary | ICD-10-CM | POA: Diagnosis present

## 2015-01-08 DIAGNOSIS — L97822 Non-pressure chronic ulcer of other part of left lower leg with fat layer exposed: Secondary | ICD-10-CM | POA: Diagnosis not present

## 2015-01-08 DIAGNOSIS — I87392 Chronic venous hypertension (idiopathic) with other complications of left lower extremity: Secondary | ICD-10-CM | POA: Diagnosis not present

## 2015-01-08 DIAGNOSIS — I10 Essential (primary) hypertension: Secondary | ICD-10-CM | POA: Diagnosis not present

## 2015-01-08 DIAGNOSIS — I499 Cardiac arrhythmia, unspecified: Secondary | ICD-10-CM | POA: Diagnosis not present

## 2015-01-08 DIAGNOSIS — I739 Peripheral vascular disease, unspecified: Secondary | ICD-10-CM | POA: Diagnosis not present

## 2015-01-14 ENCOUNTER — Other Ambulatory Visit: Payer: Self-pay | Admitting: *Deleted

## 2015-01-14 MED ORDER — OXYCODONE HCL ER 10 MG PO T12A
10.0000 mg | EXTENDED_RELEASE_TABLET | Freq: Two times a day (BID) | ORAL | Status: DC
Start: 2015-01-14 — End: 2015-01-28

## 2015-01-14 NOTE — Telephone Encounter (Signed)
Received refill request from [facility] for Oxycontin. Rx faxed to pharmacy.

## 2015-01-15 DIAGNOSIS — I10 Essential (primary) hypertension: Secondary | ICD-10-CM | POA: Diagnosis not present

## 2015-01-15 DIAGNOSIS — L89622 Pressure ulcer of left heel, stage 2: Secondary | ICD-10-CM | POA: Diagnosis not present

## 2015-01-15 DIAGNOSIS — I739 Peripheral vascular disease, unspecified: Secondary | ICD-10-CM | POA: Diagnosis not present

## 2015-01-15 DIAGNOSIS — I87392 Chronic venous hypertension (idiopathic) with other complications of left lower extremity: Secondary | ICD-10-CM | POA: Diagnosis not present

## 2015-01-23 ENCOUNTER — Encounter (HOSPITAL_COMMUNITY): Payer: Self-pay | Admitting: *Deleted

## 2015-01-23 ENCOUNTER — Emergency Department (HOSPITAL_COMMUNITY): Payer: Medicare Other

## 2015-01-23 ENCOUNTER — Inpatient Hospital Stay (HOSPITAL_COMMUNITY)
Admission: EM | Admit: 2015-01-23 | Discharge: 2015-01-28 | DRG: 872 | Disposition: A | Discharge status: Home / Self Care | Payer: Medicare Other | Attending: Internal Medicine | Admitting: Internal Medicine

## 2015-01-23 ENCOUNTER — Inpatient Hospital Stay (HOSPITAL_COMMUNITY): Payer: Medicare Other

## 2015-01-23 ENCOUNTER — Telehealth: Payer: Self-pay | Admitting: *Deleted

## 2015-01-23 DIAGNOSIS — L97929 Non-pressure chronic ulcer of unspecified part of left lower leg with unspecified severity: Secondary | ICD-10-CM | POA: Diagnosis not present

## 2015-01-23 DIAGNOSIS — Z23 Encounter for immunization: Secondary | ICD-10-CM

## 2015-01-23 DIAGNOSIS — I4891 Unspecified atrial fibrillation: Secondary | ICD-10-CM | POA: Diagnosis not present

## 2015-01-23 DIAGNOSIS — L97909 Non-pressure chronic ulcer of unspecified part of unspecified lower leg with unspecified severity: Secondary | ICD-10-CM

## 2015-01-23 DIAGNOSIS — M609 Myositis, unspecified: Secondary | ICD-10-CM | POA: Diagnosis not present

## 2015-01-23 DIAGNOSIS — S81809A Unspecified open wound, unspecified lower leg, initial encounter: Secondary | ICD-10-CM | POA: Diagnosis present

## 2015-01-23 DIAGNOSIS — Z86718 Personal history of other venous thrombosis and embolism: Secondary | ICD-10-CM

## 2015-01-23 DIAGNOSIS — T798XXA Other early complications of trauma, initial encounter: Secondary | ICD-10-CM

## 2015-01-23 DIAGNOSIS — I5033 Acute on chronic diastolic (congestive) heart failure: Secondary | ICD-10-CM | POA: Diagnosis present

## 2015-01-23 DIAGNOSIS — L97924 Non-pressure chronic ulcer of unspecified part of left lower leg with necrosis of bone: Secondary | ICD-10-CM | POA: Diagnosis not present

## 2015-01-23 DIAGNOSIS — I482 Chronic atrial fibrillation: Secondary | ICD-10-CM | POA: Diagnosis not present

## 2015-01-23 DIAGNOSIS — I10 Essential (primary) hypertension: Secondary | ICD-10-CM | POA: Diagnosis not present

## 2015-01-23 DIAGNOSIS — I739 Peripheral vascular disease, unspecified: Secondary | ICD-10-CM | POA: Diagnosis not present

## 2015-01-23 DIAGNOSIS — Z91048 Other nonmedicinal substance allergy status: Secondary | ICD-10-CM

## 2015-01-23 DIAGNOSIS — R509 Fever, unspecified: Secondary | ICD-10-CM | POA: Diagnosis not present

## 2015-01-23 DIAGNOSIS — R0600 Dyspnea, unspecified: Secondary | ICD-10-CM | POA: Diagnosis not present

## 2015-01-23 DIAGNOSIS — A4152 Sepsis due to Pseudomonas: Principal | ICD-10-CM | POA: Diagnosis present

## 2015-01-23 DIAGNOSIS — M869 Osteomyelitis, unspecified: Secondary | ICD-10-CM | POA: Diagnosis not present

## 2015-01-23 DIAGNOSIS — L03116 Cellulitis of left lower limb: Secondary | ICD-10-CM | POA: Diagnosis present

## 2015-01-23 DIAGNOSIS — E039 Hypothyroidism, unspecified: Secondary | ICD-10-CM | POA: Diagnosis not present

## 2015-01-23 DIAGNOSIS — I110 Hypertensive heart disease with heart failure: Secondary | ICD-10-CM | POA: Diagnosis present

## 2015-01-23 DIAGNOSIS — E038 Other specified hypothyroidism: Secondary | ICD-10-CM | POA: Diagnosis not present

## 2015-01-23 DIAGNOSIS — I5022 Chronic systolic (congestive) heart failure: Secondary | ICD-10-CM | POA: Diagnosis present

## 2015-01-23 DIAGNOSIS — Z881 Allergy status to other antibiotic agents status: Secondary | ICD-10-CM

## 2015-01-23 DIAGNOSIS — A419 Sepsis, unspecified organism: Secondary | ICD-10-CM | POA: Diagnosis not present

## 2015-01-23 DIAGNOSIS — Z79899 Other long term (current) drug therapy: Secondary | ICD-10-CM

## 2015-01-23 DIAGNOSIS — L8990 Pressure ulcer of unspecified site, unspecified stage: Secondary | ICD-10-CM | POA: Insufficient documentation

## 2015-01-23 DIAGNOSIS — L89899 Pressure ulcer of other site, unspecified stage: Secondary | ICD-10-CM | POA: Diagnosis present

## 2015-01-23 DIAGNOSIS — IMO0001 Reserved for inherently not codable concepts without codable children: Secondary | ICD-10-CM | POA: Diagnosis present

## 2015-01-23 DIAGNOSIS — Z7901 Long term (current) use of anticoagulants: Secondary | ICD-10-CM | POA: Diagnosis not present

## 2015-01-23 DIAGNOSIS — B965 Pseudomonas (aeruginosa) (mallei) (pseudomallei) as the cause of diseases classified elsewhere: Secondary | ICD-10-CM | POA: Diagnosis present

## 2015-01-23 DIAGNOSIS — I481 Persistent atrial fibrillation: Secondary | ICD-10-CM | POA: Diagnosis not present

## 2015-01-23 LAB — URINALYSIS, ROUTINE W REFLEX MICROSCOPIC
BILIRUBIN URINE: NEGATIVE
GLUCOSE, UA: NEGATIVE mg/dL
HGB URINE DIPSTICK: NEGATIVE
Ketones, ur: NEGATIVE mg/dL
Leukocytes, UA: NEGATIVE
Nitrite: NEGATIVE
PH: 7.5 (ref 5.0–8.0)
Protein, ur: NEGATIVE mg/dL
SPECIFIC GRAVITY, URINE: 1.011 (ref 1.005–1.030)
UROBILINOGEN UA: 0.2 mg/dL (ref 0.0–1.0)

## 2015-01-23 LAB — APTT: APTT: 40 s — AB (ref 24–37)

## 2015-01-23 LAB — COMPREHENSIVE METABOLIC PANEL
ALBUMIN: 3.2 g/dL — AB (ref 3.5–5.0)
ALK PHOS: 77 U/L (ref 38–126)
ALT: 13 U/L — AB (ref 14–54)
ALT: 16 U/L (ref 14–54)
ANION GAP: 9 (ref 5–15)
AST: 33 U/L (ref 15–41)
AST: 39 U/L (ref 15–41)
Albumin: 2.7 g/dL — ABNORMAL LOW (ref 3.5–5.0)
Alkaline Phosphatase: 80 U/L (ref 38–126)
Anion gap: 11 (ref 5–15)
BUN: 19 mg/dL (ref 6–20)
BUN: 18 mg/dL (ref 6–20)
CALCIUM: 9.2 mg/dL (ref 8.9–10.3)
CHLORIDE: 101 mmol/L (ref 101–111)
CHLORIDE: 104 mmol/L (ref 101–111)
CO2: 29 mmol/L (ref 22–32)
CO2: 27 mmol/L (ref 22–32)
CREATININE: 1.1 mg/dL — AB (ref 0.44–1.00)
Calcium: 8.3 mg/dL — ABNORMAL LOW (ref 8.9–10.3)
Creatinine, Ser: 1.07 mg/dL — ABNORMAL HIGH (ref 0.44–1.00)
GFR calc Af Amer: 52 mL/min — ABNORMAL LOW (ref >60)
GFR calc non Af Amer: 45 mL/min — ABNORMAL LOW (ref >60)
GFR, EST AFRICAN AMERICAN: 54 mL/min — AB (ref >60)
GFR, EST NON AFRICAN AMERICAN: 47 mL/min — AB (ref >60)
GLUCOSE: 95 mg/dL (ref 65–99)
Glucose, Bld: 104 mg/dL — ABNORMAL HIGH (ref 65–99)
Potassium: 3.9 mmol/L (ref 3.5–5.1)
Potassium: 3.7 mmol/L (ref 3.5–5.1)
SODIUM: 141 mmol/L (ref 135–145)
SODIUM: 140 mmol/L (ref 135–145)
Total Bilirubin: 1.6 mg/dL — ABNORMAL HIGH (ref 0.3–1.2)
Total Bilirubin: 1.9 mg/dL — ABNORMAL HIGH (ref 0.3–1.2)
Total Protein: 8 g/dL (ref 6.5–8.1)
Total Protein: 6.8 g/dL (ref 6.5–8.1)

## 2015-01-23 LAB — PROTIME-INR
INR: 2.8 — AB (ref 0.00–1.49)
INR: 2.89 — AB (ref 0.00–1.49)
PROTHROMBIN TIME: 29 s — AB (ref 11.6–15.2)
Prothrombin Time: 29.8 seconds — ABNORMAL HIGH (ref 11.6–15.2)

## 2015-01-23 LAB — CBC WITH DIFFERENTIAL/PLATELET
BASOS PCT: 0 %
Basophils Absolute: 0 10*3/uL (ref 0.0–0.1)
Basophils Absolute: 0 10*3/uL (ref 0.0–0.1)
Basophils Relative: 0 %
EOS ABS: 0 10*3/uL (ref 0.0–0.7)
EOS ABS: 0 10*3/uL (ref 0.0–0.7)
EOS PCT: 0 %
Eosinophils Relative: 0 %
HCT: 32.4 % — ABNORMAL LOW (ref 36.0–46.0)
HEMATOCRIT: 35.3 % — AB (ref 36.0–46.0)
HEMOGLOBIN: 11.5 g/dL — AB (ref 12.0–15.0)
Hemoglobin: 10.3 g/dL — ABNORMAL LOW (ref 12.0–15.0)
LYMPHS ABS: 0.4 10*3/uL — AB (ref 0.7–4.0)
LYMPHS ABS: 0.2 10*3/uL — AB (ref 0.7–4.0)
Lymphocytes Relative: 3 %
Lymphocytes Relative: 2 %
MCH: 31.9 pg (ref 26.0–34.0)
MCH: 30.9 pg (ref 26.0–34.0)
MCHC: 32.6 g/dL (ref 30.0–36.0)
MCHC: 31.8 g/dL (ref 30.0–36.0)
MCV: 97.8 fL (ref 78.0–100.0)
MCV: 97.3 fL (ref 78.0–100.0)
MONO ABS: 0.6 10*3/uL (ref 0.1–1.0)
MONO ABS: 1 10*3/uL (ref 0.1–1.0)
MONOS PCT: 5 %
Monocytes Relative: 7 %
NEUTROS ABS: 11 10*3/uL — AB (ref 1.7–7.7)
NEUTROS PCT: 92 %
Neutro Abs: 12.8 10*3/uL — ABNORMAL HIGH (ref 1.7–7.7)
Neutrophils Relative %: 91 %
PLATELETS: 148 10*3/uL — AB (ref 150–400)
Platelets: 169 10*3/uL (ref 150–400)
RBC: 3.61 MIL/uL — ABNORMAL LOW (ref 3.87–5.11)
RBC: 3.33 MIL/uL — ABNORMAL LOW (ref 3.87–5.11)
RDW: 14.8 % (ref 11.5–15.5)
RDW: 15 % (ref 11.5–15.5)
WBC: 12 10*3/uL — ABNORMAL HIGH (ref 4.0–10.5)
WBC: 14.1 10*3/uL — AB (ref 4.0–10.5)

## 2015-01-23 LAB — I-STAT CG4 LACTIC ACID, ED
LACTIC ACID, VENOUS: 2.16 mmol/L — AB (ref 0.5–2.0)
LACTIC ACID, VENOUS: 1.96 mmol/L (ref 0.5–2.0)

## 2015-01-23 LAB — LACTIC ACID, PLASMA
LACTIC ACID, VENOUS: 2.1 mmol/L — AB (ref 0.5–2.0)
LACTIC ACID, VENOUS: 2.2 mmol/L — AB (ref 0.5–2.0)

## 2015-01-23 LAB — I-STAT TROPONIN, ED: Troponin i, poc: 0.01 ng/mL (ref 0.00–0.08)

## 2015-01-23 LAB — TROPONIN I
Troponin I: 0.11 ng/mL — ABNORMAL HIGH (ref <0.031)
Troponin I: 0.25 ng/mL — ABNORMAL HIGH (ref <0.031)

## 2015-01-23 LAB — TSH: TSH: 1.08 u[IU]/mL (ref 0.350–4.500)

## 2015-01-23 LAB — MRSA PCR SCREENING: MRSA BY PCR: NEGATIVE

## 2015-01-23 LAB — PROCALCITONIN: PROCALCITONIN: 4.35 ng/mL

## 2015-01-23 LAB — MAGNESIUM: MAGNESIUM: 1.7 mg/dL (ref 1.7–2.4)

## 2015-01-23 MED ORDER — SODIUM CHLORIDE 0.9 % IV BOLUS (SEPSIS)
1000.0000 mL | INTRAVENOUS | Status: AC
  Administered 2015-01-23 (×2): 1000 mL via INTRAVENOUS

## 2015-01-23 MED ORDER — DILTIAZEM LOAD VIA INFUSION
10.0000 mg | Freq: Once | INTRAVENOUS | Status: DC
  Filled 2015-01-23: qty 10

## 2015-01-23 MED ORDER — PNEUMOCOCCAL VAC POLYVALENT 25 MCG/0.5ML IJ INJ
0.5000 mL | INJECTION | INTRAMUSCULAR | Status: AC
  Administered 2015-01-24: 0.5 mL via INTRAMUSCULAR
  Filled 2015-01-23: qty 0.5

## 2015-01-23 MED ORDER — METOPROLOL TARTRATE 12.5 MG HALF TABLET
12.5000 mg | ORAL_TABLET | Freq: Two times a day (BID) | ORAL | Status: DC
  Administered 2015-01-24 – 2015-01-26 (×4): 12.5 mg via ORAL
  Filled 2015-01-23 (×4): qty 1

## 2015-01-23 MED ORDER — DILTIAZEM HCL 25 MG/5ML IV SOLN
10.0000 mg | Freq: Once | INTRAVENOUS | Status: AC
  Administered 2015-01-23: 10 mg via INTRAVENOUS
  Filled 2015-01-23: qty 5

## 2015-01-23 MED ORDER — PIPERACILLIN-TAZOBACTAM 3.375 G IVPB
3.3750 g | Freq: Three times a day (TID) | INTRAVENOUS | Status: DC
  Administered 2015-01-23 – 2015-01-28 (×15): 3.375 g via INTRAVENOUS
  Filled 2015-01-23 (×19): qty 50

## 2015-01-23 MED ORDER — WARFARIN - PHARMACIST DOSING INPATIENT: Freq: Every day | Status: DC

## 2015-01-23 MED ORDER — VANCOMYCIN HCL IN DEXTROSE 1-5 GM/200ML-% IV SOLN
1000.0000 mg | Freq: Once | INTRAVENOUS | Status: AC
  Administered 2015-01-23: 1000 mg via INTRAVENOUS
  Filled 2015-01-23: qty 200

## 2015-01-23 MED ORDER — LEVALBUTEROL HCL 0.63 MG/3ML IN NEBU: 0.6300 mg | INHALATION_SOLUTION | Freq: Four times a day (QID) | RESPIRATORY_TRACT | Status: DC | PRN

## 2015-01-23 MED ORDER — SODIUM CHLORIDE 0.9 % IV SOLN
INTRAVENOUS | Status: AC
  Administered 2015-01-23: 100 mL/h via INTRAVENOUS
  Administered 2015-01-23: 13:00:00 via INTRAVENOUS

## 2015-01-23 MED ORDER — ENOXAPARIN SODIUM 40 MG/0.4ML ~~LOC~~ SOLN: 40.0000 mg | SUBCUTANEOUS | Status: DC

## 2015-01-23 MED ORDER — OXYCODONE HCL ER 10 MG PO T12A
10.0000 mg | EXTENDED_RELEASE_TABLET | Freq: Two times a day (BID) | ORAL | Status: DC
  Administered 2015-01-23 – 2015-01-28 (×10): 10 mg via ORAL
  Filled 2015-01-23 (×10): qty 1

## 2015-01-23 MED ORDER — ONDANSETRON HCL 4 MG/2ML IJ SOLN: 4.0000 mg | Freq: Four times a day (QID) | INTRAMUSCULAR | Status: DC | PRN

## 2015-01-23 MED ORDER — ACETAMINOPHEN 325 MG PO TABS
650.0000 mg | ORAL_TABLET | Freq: Once | ORAL | Status: AC
  Administered 2015-01-23: 650 mg via ORAL
  Filled 2015-01-23: qty 2

## 2015-01-23 MED ORDER — WARFARIN SODIUM 4 MG PO TABS
4.5000 mg | ORAL_TABLET | Freq: Once | ORAL | Status: AC
  Administered 2015-01-23: 4.5 mg via ORAL
  Filled 2015-01-23: qty 1

## 2015-01-23 MED ORDER — VANCOMYCIN HCL IN DEXTROSE 750-5 MG/150ML-% IV SOLN
750.0000 mg | INTRAVENOUS | Status: DC
  Administered 2015-01-24 – 2015-01-25 (×2): 750 mg via INTRAVENOUS
  Filled 2015-01-23 (×3): qty 150

## 2015-01-23 MED ORDER — ONDANSETRON HCL 4 MG PO TABS: 4.0000 mg | ORAL_TABLET | Freq: Four times a day (QID) | ORAL | Status: DC | PRN

## 2015-01-23 MED ORDER — SODIUM CHLORIDE 0.9 % IV BOLUS (SEPSIS)
500.0000 mL | Freq: Once | INTRAVENOUS | Status: AC
Start: 2015-01-23 — End: 2015-01-23
  Administered 2015-01-23: 500 mL via INTRAVENOUS

## 2015-01-23 MED ORDER — COLLAGENASE 250 UNIT/GM EX OINT
TOPICAL_OINTMENT | Freq: Every day | CUTANEOUS | Status: DC
  Administered 2015-01-23 – 2015-01-28 (×4): via TOPICAL
  Filled 2015-01-23 (×2): qty 30

## 2015-01-23 MED ORDER — SODIUM CHLORIDE 0.9 % IV BOLUS (SEPSIS)
250.0000 mL | Freq: Once | INTRAVENOUS | Status: AC
  Administered 2015-01-23: 250 mL via INTRAVENOUS

## 2015-01-23 MED ORDER — INFLUENZA VAC SPLIT QUAD 0.5 ML IM SUSY
0.5000 mL | PREFILLED_SYRINGE | INTRAMUSCULAR | Status: AC
  Administered 2015-01-24: 0.5 mL via INTRAMUSCULAR
  Filled 2015-01-23: qty 0.5

## 2015-01-23 MED ORDER — PIPERACILLIN-TAZOBACTAM 3.375 G IVPB 30 MIN
3.3750 g | Freq: Once | INTRAVENOUS | Status: AC
  Administered 2015-01-23: 3.375 g via INTRAVENOUS
  Filled 2015-01-23: qty 50

## 2015-01-23 MED ORDER — OXYCODONE HCL 5 MG PO TABS
5.0000 mg | ORAL_TABLET | ORAL | Status: DC | PRN
  Administered 2015-01-23 – 2015-01-26 (×3): 5 mg via ORAL
  Filled 2015-01-23 (×3): qty 1

## 2015-01-23 MED ORDER — DOCUSATE SODIUM 100 MG PO CAPS
100.0000 mg | ORAL_CAPSULE | Freq: Two times a day (BID) | ORAL | Status: DC
Start: 2015-01-23 — End: 2015-01-28
  Administered 2015-01-23 – 2015-01-28 (×8): 100 mg via ORAL
  Filled 2015-01-23 (×9): qty 1

## 2015-01-23 MED ORDER — ACETAMINOPHEN 325 MG PO TABS
650.0000 mg | ORAL_TABLET | Freq: Four times a day (QID) | ORAL | Status: DC | PRN
  Administered 2015-01-25 – 2015-01-28 (×5): 650 mg via ORAL
  Filled 2015-01-23 (×5): qty 2

## 2015-01-23 MED ORDER — DEXTROSE 5 % IV SOLN: 5.0000 mg/h | INTRAVENOUS | Status: DC

## 2015-01-23 MED ORDER — ACETAMINOPHEN 650 MG RE SUPP: 650.0000 mg | Freq: Four times a day (QID) | RECTAL | Status: DC | PRN

## 2015-01-23 NOTE — Progress Notes (Addendum)
ANTIBIOTIC & ANTIMICROBIAL CONSULT NOTE - INITIAL  Pharmacy Consult for vancomycin, zosyn, warfarin Indication: sepsis, AFib  Allergies  Allergen Reactions  . Bactrim Ds [Sulfamethoxazole-Trimethoprim] Rash  . Tape Rash    Patient Measurements: Height:  (160 cm) Weight: 127 lb (57.607 kg) IBW/kg (Calculated) : 52.4 Adjusted Body Weight:   Vital Signs: Temp: 103.1 F (39.5 C) (09/28 0946) Temp Source: Oral (09/28 0946) BP: 144/66 mmHg (09/28 0946) Pulse Rate: 138 (09/28 0946) Intake/Output from previous day:   Intake/Output from this shift:    Labs: No results for input(s): WBC, HGB, PLT, LABCREA, CREATININE in the last 72 hours. CrCl cannot be calculated (Patient has no serum creatinine result on file.). No results for input(s): VANCOTROUGH, VANCOPEAK, VANCORANDOM, GENTTROUGH, GENTPEAK, GENTRANDOM, TOBRATROUGH, TOBRAPEAK, TOBRARND, AMIKACINPEAK, AMIKACINTROU, AMIKACIN in the last 72 hours.   Microbiology: No results found for this or any previous visit (from the past 720 hour(s)).  Medical History: Past Medical History  Diagnosis Date  . PAD (peripheral artery disease) 2013    left leg. sounds like she had angio-plasty   . Atrial fibrillation 2013    s/p [facility] that failed. ON medication  . Hypertension   . Osteoporosis   . DVT (deep venous thrombosis) 2013    "LLE" (08/04/2012)  . Hypothyroidism   . History of blood transfusion 2013    "related to thrombectomy" (08/04/2012)  . Anemia     "slightly" (08/04/2012)  . Arthritis     "probably minor; knees, pinky" (08/04/2012)  . Fracture of L4 vertebra     "dx'd in 2013; can't treat til legs fixed" (08/04/2012)  . Lower extremity ulceration   . Biventricular heart failure with reduced left ventricular function     Medications:  See EMR  Assessment: 81 yo Diaz admitted with fever, chills, generalized weakness. Tmax/24h 103.1, SCr 1.1, eCrCl ~ 30 ml/min, WBC 134.1, LA 2.1. IV antibiotics initially ordered for  cellulitis, then the pt was escalated to a code sepsis.   Pt is also on warfarin PTA for AFib, current INR 2.89, therapeutic.  PTA dose: 3 mg on Tues/Fri, 4.5 mg on all other days  Goal of Therapy:  Vancomycin trough level 15-20 mcg/ml  INR 2-3  Plan:  -Vancomycin 1 g IV x1 then 750 iV q24h -Zosyn 3.375 g IV q8h -Monitor renal fx, cultures, duration of therapy, VT as needed -Warfarin 4.5 mg po x1  -Daily INR    Agapito Games, PharmD, BCPS Clinical Pharmacist Pager: [phone] 01/23/2015 10:02 AM

## 2015-01-23 NOTE — ED Notes (Signed)
Patient transported to MRI 

## 2015-01-23 NOTE — ED Notes (Signed)
MD Rees notified possible sepsis, MD at bedside  

## 2015-01-23 NOTE — Telephone Encounter (Signed)
Received phone call from [place] from [facility] regarding pt, she is cold and shakey, Temp 102, pulse 100 irregular, respirations 24, lungs clear want to know what Dr.K wants them to do. Told her hang on I will talk to Dr.K put on hold. Jake Samples, discussed with Dr. Kirtland Bouchard pt's status and he said send to ED to be evaluated for labs and x-rays. Marylene Land verbalized understanding.

## 2015-01-23 NOTE — ED Notes (Signed)
MD Abdol aware of lactic, verbal order to increase NS to 100/hr.

## 2015-01-23 NOTE — ED Notes (Signed)
Family at bedside. 

## 2015-01-23 NOTE — H&P (Signed)
[facility] History and Physical  Tenzin Pavon ZOX:[id number] DOB: November 27, 1929 DOA: 01/23/2015  Referring physician: ER  PCP: [name], MD   Chief Complaint: Fever/drainage from left lower extremity   HPI:  81 year old female with a history of peripheral arterial disease, atrial fibrillation, left lower extremity DVT, heart failure on torsemide, presents with left lower extremity redness and ulceration. Patient lives at [facility] , and developed a fever of 102, tachycardia, tachypnea, and transferred to the ER today for further evaluation. Patient is evaluated and followed at the wound care clinic on a regular basis for her left lower extremity ulcer which she's had for the last 2 months. She presents with worsening pain and left lower extremity erythema. Patient states that she is able to bear weight on her left lower extremity but her mobility has been somewhat limited because of L3-L4 fracture and she has been taking OxyContin for her back pain. In the ER the patient is febrile with a temperature to 103.1, tachycardic in A. fib with RVR with a pulse of 138. White count 12,000. INR 2.8      Review of Systems: negative for the following  Constitutional: Positive for fever, chills, diaphoresis, denies appetite change and fatigue.  HEENT: Denies photophobia, eye pain, redness, hearing loss, ear pain, congestion, sore throat, rhinorrhea, sneezing, mouth sores, trouble swallowing, neck pain, neck stiffness and tinnitus.  Respiratory: Denies SOB, DOE, cough, chest tightness, and wheezing.  Cardiovascular: Denies chest pain, palpitations and leg swelling.  Gastrointestinal: Denies nausea, vomiting, abdominal pain, diarrhea, constipation, blood in stool and abdominal distention.  Genitourinary: Denies dysuria, urgency, frequency, hematuria, flank pain and difficulty urinating.  Musculoskeletal: Denies myalgias, back pain, joint swelling, arthralgias and gait problem.   Skin: Left lower extremity redness and erythema Neurological: Denies dizziness, seizures, syncope, weakness, light-headedness, numbness and headaches.  Hematological: Denies adenopathy. Easy bruising, personal or family bleeding history  Psychiatric/Behavioral: Denies suicidal ideation, mood changes, confusion, nervousness, sleep disturbance and agitation       Past Medical History  Diagnosis Date  . PAD (peripheral artery disease) 2013    left leg. sounds like she had angio-plasty   . Atrial fibrillation 2013    s/p [facility] that failed. ON medication  . Hypertension   . Osteoporosis   . DVT (deep venous thrombosis) 2013    "LLE" (08/04/2012)  . Hypothyroidism   . History of blood transfusion 2013    "related to thrombectomy" (08/04/2012)  . Anemia     "slightly" (08/04/2012)  . Arthritis     "probably minor; knees, pinky" (08/04/2012)  . Fracture of L4 vertebra     "dx'd in 2013; can't treat til legs fixed" (08/04/2012)  . Lower extremity ulceration   . Biventricular heart failure with reduced left ventricular function      Past Surgical History  Procedure Laterality Date  . Thrombectomy Left 2013    "leg" (08/04/2012)  . G2p2    . Tonsillectomy and adenoidectomy  ~ 1938  . Dilation and curettage of uterus  1960's      Social History:  reports that she has never smoked. She has never used smokeless tobacco. She reports that she drinks alcohol. She reports that she does not use illicit drugs.    Allergies  Allergen Reactions  . Bactrim Ds [Sulfamethoxazole-Trimethoprim] Rash  . Tape Rash    Family History  Problem Relation Age of Onset  . Arthritis Mother   . Cancer Mother     breast cancer   .  Cancer Brother     lung cancer  . Tuberculosis Father   . Cancer Sister     stg 4 breast cancer  . Diabetes Neg Hx   . Heart disease Neg Hx   . COPD Neg Hx          Prior to Admission medications   Medication Sig Start Date End Date Taking? Authorizing Provider   collagenase (SANTYL) ointment Apply 1 application topically every other day. To bilateral lower extremities wounds   Yes Historical Provider, MD  docusate sodium (COLACE) 100 MG capsule Take 100 mg by mouth daily.   Yes Historical Provider, MD  levothyroxine (SYNTHROID, LEVOTHROID) 25 MCG tablet TAKE 1 AND 1/2 TABS (37.5MG ) BY MOUTH EVERY DAY*CHECK PULSE WEEKLY* 07/14/12  Yes Jacques Navy, MD  metoprolol succinate (TOPROL-XL) 25 MG 24 hr tablet Take 0.5 tablets (12.5 mg total) by mouth daily. 11/01/12  Yes Jacques Navy, MD  Multiple Vitamin (MULTIVITAMIN WITH MINERALS) TABS Take 1 tablet by mouth every morning.   Yes Historical Provider, MD  OxyCODONE (OXYCONTIN) 10 mg T12A 12 hr tablet Take 1 tablet (10 mg total) by mouth every 12 (twelve) hours. 01/14/15  Yes Gordy Savers, MD  potassium chloride (MICRO-K) 10 MEQ CR capsule Take 10 mEq by mouth 2 (two) times daily.   Yes Historical Provider, MD  torsemide (DEMADEX) 20 MG tablet TAKE 1 TAB BY MOUTH EVERY DAY -- DX: EDEMA AND TAKE 1 TAB BY MOUTH AT 5PM ON MON WED FRI (THIS IS.IN ADDITION TO MORNING SCHEDULE DOSE) -... Patient taking differently: Take 1 tablet by mouth every morning at 8 am in addition to toresemide  three times weekly 04/24/14  Yes Laurey Morale, MD  torsemide [facility]) 20 MG tablet Take 20 mg by mouth every Monday, Wednesday, and Friday. At 5 pm in addition to daily torsemide    Yes Historical Provider, MD  warfarin (COUMADIN) 2 MG tablet Take 2 mg by mouth See admin instructions. Take 2 mg by mouth with a 2.5 mg tablet on Mon, Wed, Thurs, Sat.   Yes Historical Provider, MD  warfarin (COUMADIN) 2.5 MG tablet Take 2.5 mg by mouth See admin instructions. Take 2.5mg  tablet by mouth with a  tablet daily on Mon, Wed, Thurs, and Sat.   Yes Historical Provider, MD  warfarin (COUMADIN) 3 MG tablet Take 3 mg by mouth See admin instructions. Take 1 tablet by mouth every Tuesday and Friday   Yes Historical Provider, MD   nystatin (MYCOSTATIN) powder Apply topically 4 (four) times daily. 07/08/12   Jacques Navy, MD  potassium chloride (K-DUR,KLOR-CON) 10 MEQ tablet Take 1 tablet (10 mEq total) by mouth 2 (two) times daily. 08/11/12   Hadassah Pais, PA-C  warfarin (COUMADIN) 3 MG tablet TAKE 1 TAB ( ) BY MOUTH EVERY SUNDAY MON TUES THURS AND FRI AND TAKE 1 AND 1/2 TABS (4.5MG ) BY MOUTH EVERY WED AND SAT 11/21/12   Jacques Navy, MD     Physical Exam: Filed Vitals:   01/23/15 1125 01/23/15 1130 01/23/15 1130 01/23/15 1145  BP:  108/64  110/49  Pulse: 113 100  110  Temp:   100.3 F (37.9 C)   TempSrc:   Oral   Resp: Height:      Weight:      SpO2: 98% 97%  99%     Constitutional: Vital signs reviewed. Patient is a well-developed and well-nourished in no acute distress and cooperative with exam.  Alert and oriented x3.  Head: Normocephalic and atraumatic  Ear: TM normal bilaterally  Mouth: no erythema or exudates, MMM  Eyes: PERRL, EOMI, conjunctivae normal, No scleral icterus.  Neck: Supple, Trachea midline normal ROM, No JVD, mass, thyromegaly, or carotid bruit present.  Cardiovascular: RRR, S1 normal, S2 normal, no MRG, pulses symmetric and intact bilaterally  Pulmonary/Chest: CTAB, no wheezes, rales, or rhonchi  Abdominal: Soft. Non-tender, non-distended, bowel sounds are normal, no masses, organomegaly, or guarding present.  GU: no CVA tenderness Musculoskeletal: No joint deformities, erythema, or stiffness, ROM full and no nontender Ext: Left lower extremity ulcer measuring 6 cm x 2 cm, with purulent drainage and surrounding cellulitis Hematology: no cervical, inginal, or axillary adenopathy.  Neurological: A&O x3, Strenght is normal and symmetric bilaterally, cranial nerve II-XII are grossly intact, no focal motor deficit, sensory intact to light touch bilaterally.  Skin: As above.  Psychiatric: Normal mood and affect. speech and behavior is normal. Judgment and thought  content normal. Cognition and memory are normal.      Data Review   Micro Results No results found for this or any previous visit (from the past 240 hour(s)).  Radiology Reports Dg Chest 2 View  01/23/2015   CLINICAL DATA:  Fevers and chills.  EXAM: CHEST  2 VIEW  COMPARISON:  12/15/2012  FINDINGS: There is no focal parenchymal opacity. There is no pleural effusion or pneumothorax. There is stable cardiomegaly.  The osseous structures are unremarkable.  IMPRESSION: No active cardiopulmonary disease.   Electronically Signed   By: Elige Ko   On: 01/23/2015 11:04     CBC  Recent Labs Lab 01/23/15 1003  WBC 12.0*  HGB 11.5*  HCT 35.3*  PLT 169  MCV 97.8  MCH 31.9  MCHC 32.6  RDW 14.8  LYMPHSABS 0.4*  MONOABS 0.6  EOSABS 0.0  BASOSABS 0.0    Chemistries   Recent Labs Lab 01/23/15 1003  NA 141  K 3.9  CL 101  CO2 29  GLUCOSE 95  BUN 19  CREATININE 1.10*  CALCIUM 9.2  AST 33  ALT 13*  ALKPHOS 77  BILITOT 1.6*   ------------------------------------------------------------------------------------------------------------------ estimated creatinine clearance is 32.1 mL/min (by C-G formula based on Cr of 1.1). ------------------------------------------------------------------------------------------------------------------ No results for input(s): HGBA1C in the last 72 hours. ------------------------------------------------------------------------------------------------------------------ No results for input(s): CHOL, HDL, LDLCALC, TRIG, CHOLHDL, LDLDIRECT in the last 72 hours. ------------------------------------------------------------------------------------------------------------------ No results for input(s): TSH, T4TOTAL, T3FREE, THYROIDAB in the last 72 hours.  Invalid input(s): FREET3 ------------------------------------------------------------------------------------------------------------------ No results for input(s): VITAMINB12, FOLATE, FERRITIN,  TIBC, IRON, RETICCTPCT in the last 72 hours.  Coagulation profile  Recent Labs Lab 01/23/15 1003  INR 2.80*    No results for input(s): DDIMER in the last 72 hours.  Cardiac Enzymes No results for input(s): CKMB, TROPONINI, MYOGLOBIN in the last 168 hours.  Invalid input(s): CK ------------------------------------------------------------------------------------------------------------------ Invalid input(s): POCBNP   CBG: No results for input(s): GLUCAP in the last 168 hours.     EKG: Independently reviewed. Atrial fibrillation with rapid ventricular response   Assessment/Plan Principal Problem:   Sepsis-likely secondary to left lower extremity cellulitis/left lower extremity ulcer, probable osteomyelitis, lactic acid elevated, patient has been started on broad-spectrum antibiotics, vancomycin/Zosyn, cautiously hydrate the patient gives an EF of 30-35% in 2014. A 1 bolus of normal saline and continue normal saline at 75 mL an hour. Follow blood culture results. Hold diuretics    Atrial fibrillation-continue anticoagulation per pharmacy, start the patient on a Cardizem drip, hold metoprolol  Hypertension-patient will be started on Cardizem drip for atrial fibrillation with rapid ventricular response    Acute on chronic diastolic heart failure-repeat 2-D echo, continue to cycle cardiac enzymes, hold Demadex secondary to sepsis     Hypothyroidism-continue Synthroid and check TSH    Wound of lower extremity start broad-spectrum antibiotics, wound care consultation, MRI to rule out osteomyelitis  DVT prophylaxis-Coumadin  Code Status:   full Family Communication: bedside Disposition Plan: admit   Total time spent 55 minutes.Greater than 50% of this time was spent in counseling, explanation of diagnosis, planning of further management, and coordination of care  [facility] Pager [phone]  If 7PM-7AM, please contact  night-coverage [email or web address] Password [facility] 01/23/2015, 11:49 AM

## 2015-01-23 NOTE — ED Notes (Signed)
Wound RN at bedside. 

## 2015-01-23 NOTE — ED Notes (Signed)
Patient transported to MRI, VSS, HR 105-115. BP stable.

## 2015-01-23 NOTE — ED Provider Notes (Signed)
CSN: [id number]     Arrival date & time 01/23/15  0941 History   First MD Initiated Contact with Patient 01/23/15 [phone]     Chief Complaint  Patient presents with  . Fever  . Chills     Patient is a 81 y.o. female presenting with fever. The history is provided by the patient. No language interpreter was used.  Fever  Ms. Jabs presents for evaluation of fever/chills.  She was in her routine state of health until she developed shaking chills this morning.  She denies any chest pain, SOB, N/V, abdominal pain, dysuria, diarrhea.  She has a chronic LLE wound that is followed in the wound center and had a dressing change two days ago.  She has a hx/o afib on coumadin.  Sxs are severe and constant.    Past Medical History  Diagnosis Date  . PAD (peripheral artery disease) 2013    left leg. sounds like she had angio-plasty   . Atrial fibrillation 2013    s/p [facility] that failed. ON medication  . Hypertension   . Osteoporosis   . DVT (deep venous thrombosis) 2013    "LLE" (08/04/2012)  . Hypothyroidism   . History of blood transfusion 2013    "related to thrombectomy" (08/04/2012)  . Anemia     "slightly" (08/04/2012)  . Arthritis     "probably minor; knees, pinky" (08/04/2012)  . Fracture of L4 vertebra     "dx'd in 2013; can't treat til legs fixed" (08/04/2012)  . Lower extremity ulceration   . Biventricular heart failure with reduced left ventricular function    Past Surgical History  Procedure Laterality Date  . Thrombectomy Left 2013    "leg" (08/04/2012)  . G2p2    . Tonsillectomy and adenoidectomy  ~ 1938  . Dilation and curettage of uterus  1960's   Family History  Problem Relation Age of Onset  . Arthritis Mother   . Cancer Mother     breast cancer   . Cancer Brother     lung cancer  . Tuberculosis Father   . Cancer Sister     stg 4 breast cancer  . Diabetes Neg Hx   . Heart disease Neg Hx   . COPD Neg Hx    Social History  Substance Use Topics  . Smoking status:  Never Smoker   . Smokeless tobacco: Never Used  . Alcohol Use: 0.0 oz/week     Comment: 08/04/2012 "used to have 1-2 glasses of wine/wk; nothing for the past 1 yr"   OB History    No data available     Review of Systems  Constitutional: Positive for fever.  All other systems reviewed and are negative.     Allergies  Bactrim ds and Tape  Home Medications   Prior to Admission medications   Medication Sig Start Date End Date Taking? Authorizing Provider  collagenase (SANTYL) ointment Apply 1 application topically every other day. To bilateral lower extremities wounds   Yes Historical Provider, MD  docusate sodium (COLACE) 100 MG capsule Take 100 mg by mouth daily.   Yes Historical Provider, MD  levothyroxine (SYNTHROID, LEVOTHROID) 25 MCG tablet TAKE 1 AND 1/2 TABS (37.5MG ) BY MOUTH EVERY DAY*CHECK PULSE WEEKLY* 07/14/12  Yes Jacques Navy, MD  metoprolol succinate (TOPROL-XL) 25 MG 24 hr tablet Take 0.5 tablets (12.5 mg total) by mouth daily. 11/01/12  Yes Jacques Navy, MD  Multiple Vitamin (MULTIVITAMIN WITH MINERALS) TABS Take 1 tablet by mouth every  morning.   Yes Historical Provider, MD  OxyCODONE (OXYCONTIN) 10 mg T12A 12 hr tablet Take 1 tablet (10 mg total) by mouth every 12 (twelve) hours. 01/14/15  Yes Gordy Savers, MD  potassium chloride (MICRO-K) 10 MEQ CR capsule Take 10 mEq by mouth 2 (two) times daily.   Yes Historical Provider, MD  torsemide (DEMADEX) 20 MG tablet TAKE 1 TAB BY MOUTH EVERY DAY -- DX: EDEMA AND TAKE 1 TAB BY MOUTH AT 5PM ON MON WED FRI (THIS IS.IN ADDITION TO MORNING SCHEDULE DOSE) -... Patient taking differently: Take 1 tablet by mouth every morning at 8 am in addition to toresemide  three times weekly 04/24/14  Yes Laurey Morale, MD  torsemide [facility]) 20 MG tablet Take 20 mg by mouth every Monday, Wednesday, and Friday. At 5 pm in addition to daily torsemide    Yes Historical Provider, MD  warfarin (COUMADIN) 2 MG tablet Take 2 mg  by mouth See admin instructions. Take 2 mg by mouth with a 2.5 mg tablet on Mon, Wed, Thurs, Sat, Sun.   Yes Historical Provider, MD  warfarin (COUMADIN) 2.5 MG tablet Take 2.5 mg by mouth See admin instructions. Take 2.5mg  tablet by mouth with a  tablet daily on Mon, Wed, Thurs, and Sat, Sun.   Yes Historical Provider, MD  warfarin (COUMADIN) 3 MG tablet Take 3 mg by mouth See admin instructions. Take 1 tablet by mouth every Tuesday and Friday   Yes Historical Provider, MD  nystatin (MYCOSTATIN) powder Apply topically 4 (four) times daily. 07/08/12   Jacques Navy, MD  potassium chloride (K-DUR,KLOR-CON) 10 MEQ tablet Take 1 tablet (10 mEq total) by mouth 2 (two) times daily. 08/11/12   Hadassah Pais, PA-C  warfarin (COUMADIN) 3 MG tablet TAKE 1 TAB ( ) BY MOUTH EVERY SUNDAY MON TUES THURS AND FRI AND TAKE 1 AND 1/2 TABS (4.5MG ) BY MOUTH EVERY WED AND SAT 11/21/12   Jacques Navy, MD   BP 93/43 mmHg  Pulse 109  Temp(Src) 100.2 F (37.9 C) (Oral)  Resp 17  Ht  (1.6 m)  Wt 126 lb 15.8 oz (57.6 kg)  BMI 22.50 kg/m2  SpO2 94% Physical Exam  Constitutional: She is oriented to person, place, and time. She appears well-developed and well-nourished.  HENT:  Head: Normocephalic and atraumatic.  Cardiovascular:  No murmur heard. Tachycardic and irregular  Pulmonary/Chest: Effort normal and breath sounds normal. No respiratory distress.  kyphotic  Abdominal: Soft. There is no tenderness. There is no rebound and no guarding.  Musculoskeletal:  LLE with large open wound of lateral and posterior aspect with purulent exudate.  Marked erythema and edema of the foot.  Erythema extends up posterior aspect of the left thigh.    Neurological: She is alert and oriented to person, place, and time.  Skin: Skin is warm and dry.  Psychiatric: She has a normal mood and affect. Her behavior is normal.  Nursing note and vitals reviewed.   ED Course  Procedures  CRITICAL CARE Performed by:  Tilden Fossa   Total critical care time:30 minutes  Critical care time was exclusive of separately billable procedures and treating other patients.  Critical care was necessary to treat or prevent imminent or life-threatening deterioration.  Critical care was time spent personally by me on the following activities: development of treatment plan with patient and/or surrogate as well as nursing, discussions with consultants, evaluation of patient's response to treatment, examination of patient, obtaining history from patient  or surrogate, ordering and performing treatments and interventions, ordering and review of laboratory studies, ordering and review of radiographic studies, pulse oximetry and re-evaluation of patient's condition.  Labs Review Labs Reviewed  COMPREHENSIVE METABOLIC PANEL - Abnormal; Notable for the following:    Creatinine, Ser 1.10 (*)    Albumin 3.2 (*)    ALT 13 (*)    Total Bilirubin 1.6 (*)    GFR calc non Af Amer 45 (*)    GFR calc Af Amer 52 (*)    All other components within normal limits  CBC WITH DIFFERENTIAL/PLATELET - Abnormal; Notable for the following:    WBC 12.0 (*)    RBC 3.61 (*)    Hemoglobin 11.5 (*)    HCT 35.3 (*)    Neutro Abs 11.0 (*)    Lymphs Abs 0.4 (*)    All other components within normal limits  PROTIME-INR - Abnormal; Notable for the following:    Prothrombin Time 29.0 (*)    INR 2.80 (*)    All other components within normal limits  CBC WITH DIFFERENTIAL/PLATELET - Abnormal; Notable for the following:    WBC 14.1 (*)    RBC 3.33 (*)    Hemoglobin 10.3 (*)    HCT 32.4 (*)    Platelets 148 (*)    Neutro Abs 12.8 (*)    Lymphs Abs 0.2 (*)    All other components within normal limits  COMPREHENSIVE METABOLIC PANEL - Abnormal; Notable for the following:    Glucose, Bld 104 (*)    Creatinine, Ser 1.07 (*)    Calcium 8.3 (*)    Albumin 2.7 (*)    Total Bilirubin 1.9 (*)    GFR calc non Af Amer 47 (*)    GFR calc Af Amer 54  (*)    All other components within normal limits  LACTIC ACID, PLASMA - Abnormal; Notable for the following:    Lactic Acid, Venous 2.1 (*)    All other components within normal limits  LACTIC ACID, PLASMA - Abnormal; Notable for the following:    Lactic Acid, Venous 2.2 (*)    All other components within normal limits  PROTIME-INR - Abnormal; Notable for the following:    Prothrombin Time 29.8 (*)    INR 2.89 (*)    All other components within normal limits  APTT - Abnormal; Notable for the following:    aPTT 40 (*)    All other components within normal limits  TROPONIN I - Abnormal; Notable for the following:    Troponin I 0.11 (*)    All other components within normal limits  TROPONIN I - Abnormal; Notable for the following:    Troponin I 0.25 (*)    All other components within normal limits  I-STAT CG4 LACTIC ACID, ED - Abnormal; Notable for the following:    Lactic Acid, Venous 2.16 (*)    All other components within normal limits  MRSA PCR SCREENING  CULTURE, BLOOD (ROUTINE X 2)  CULTURE, BLOOD (ROUTINE X 2)  URINE CULTURE  URINALYSIS, ROUTINE W REFLEX MICROSCOPIC (NOT AT [facility])  PROCALCITONIN  MAGNESIUM  TSH  CBC  COMPREHENSIVE METABOLIC PANEL  CBC  PROTIME-INR  I-STAT TROPOININ, ED  I-STAT CG4 LACTIC ACID, ED    Imaging Review Dg Chest 2 View  01/23/2015   CLINICAL DATA:  Fevers and chills.  EXAM: CHEST  2 VIEW  COMPARISON:  12/15/2012  FINDINGS: There is no focal parenchymal opacity. There is no pleural effusion  or pneumothorax. There is stable cardiomegaly.  The osseous structures are unremarkable.  IMPRESSION: No active cardiopulmonary disease.   Electronically Signed   By: Elige Ko   On: 01/23/2015 11:04   Mr Tibia Fibula Left Wo Contrast  01/23/2015   CLINICAL DATA:  Left lower extremity DVT. Heart failure. Lower extremity erythema and ulceration. Fever.  EXAM: MRI OF LOWER LEFT EXTREMITY WITHOUT CONTRAST  TECHNIQUE: Multiplanar, multisequence MR imaging  of the left lower leg was performed. No intravenous contrast was administered.  COMPARISON:  None.  FINDINGS: Abnormal high T1 signal in the soleus muscle, flexor digitorum longus muscle, tibialis posterior muscle, and flexor hallucis muscle. Increased T1 signal in the peroneus brevis muscle. There is also abnormal increased T2 signal particularly in the lateral soleus, tibialis posterior, flexor digitorum longus, and extensor hallucis longus muscles. Abnormal fat density in the medial head gastrocnemius, image 1 series 6. The high T1 signal in these muscles does not have a masslike appearance but rather an infiltrative appearance favoring atrophy over hematoma. The increased T2 signal probably reflects inflammation or denervation edema.  There is some mild subcutaneous edema in both lower legs, left greater than right. No findings of osteomyelitis. No abscess.  IMPRESSION: 1. Abnormal asymmetric appearance of atrophy and low-level edema in posterior muscle groups in the left calf, and with slight involvement of the extensor hallucis longus. The appearance could most commonly be related to denervation atrophy with some low-level denervation edema. Given the lack of mass effect, and the relative atrophy of this size of muscles, I am not highly suspicious for hemorrhage. The inflammation could possibly be from myositis, correlate with fever/leukocytosis. 2. Low-level but asymmetric to the left subcutaneous edema could be from cellulitis or venous stasis. 3. No abscess or osteomyelitis.   Electronically Signed   By: Gaylyn Rong M.D.   On: 01/23/2015 14:49   I have personally reviewed and evaluated these images and lab results as part of my medical decision-making.   EKG Interpretation None      MDM   Final diagnoses:  Sepsis, due to unspecified organism  Cellulitis of left lower extremity  Wound infection, initial encounter   Pt here for evaluation of fever, examination concerning for severe  wound infection and surrounding cellulitis.  Pt started on sepsis protocol with IVF, tylenol, IVabx.  Pt in afib with RVR with initial rate to 150s, HR improved in the ED with fluids, tylenol, abx.  Discussed with hospitalist regarding admission for further treatment.  Discussed with patient findings of studies, need for admission for further treatment.     Tilden Fossa, MD 01/23/15 2006

## 2015-01-23 NOTE — Consult Note (Signed)
WOC wound consult note Reason for Consult: Consult requested for left leg wound.  Pt is very well-informed regarding topical treatment and is followed by the outpatient wound care center at [facility].  They saw her this week and ordered Medihoney.  This topical treatment is not available in the [facility] formulary. Dressings are changed 3 times a week and she wears light compression. Pt was using Santyl prior to Medihoney; will order this enzymatic debridement while in the hospital.  MRI was completed; it did not indicate an abscess or osteomyelitis.    Wound type: Chronic full thickness wound to left outer calf. Measurement: 13X5X.2cm Wound bed: 80% yellow slough, 20% red Drainage (amount, consistency, odor) mod amt yellow drainage, no odor Periwound: Intact skin surrounding. Dressing procedure/placement/frequency: Santyl for chemical debridement 3X week, covered with light compression with kerlex and coban.  Pt can resume follow-up with outpatient wound care center after discharge. Discussed plan of care with patient and she verbalized understanding.  Santyl ordered from pharmacy and bedside nurse will apply it and the dressing as ordered upon arrival. Please re-consult if further assistance is needed.  Thank-you,  Cammie Mcgee MSN, RN, CWOCN, [place], CNS [phone]

## 2015-01-23 NOTE — ED Notes (Signed)
MD paged regarding cardizem, HR 100s-110s, MD aware, orders received, MD to place.

## 2015-01-23 NOTE — ED Notes (Signed)
Pt presents via [facility] from [facility] with c/o fever, chills, and generalized weakness beginning this AM.  102 temp at facility.  Pt reports wound to left leg that has been there for "a long time".  BP-120/76 P-72 R-16 CBG 105.  Pt a x 4, NAD.

## 2015-01-23 NOTE — ED Notes (Signed)
Pt transported with this RN, pt a x 4, NAD, VSS.  RN at bedside.

## 2015-01-23 NOTE — ED Notes (Signed)
Attempted report 

## 2015-01-23 NOTE — ED Notes (Signed)
Admitting at bedside 

## 2015-01-24 ENCOUNTER — Inpatient Hospital Stay (HOSPITAL_COMMUNITY): Payer: Medicare Other

## 2015-01-24 DIAGNOSIS — L97929 Non-pressure chronic ulcer of unspecified part of left lower leg with unspecified severity: Secondary | ICD-10-CM

## 2015-01-24 DIAGNOSIS — IMO0001 Reserved for inherently not codable concepts without codable children: Secondary | ICD-10-CM | POA: Diagnosis present

## 2015-01-24 DIAGNOSIS — L03116 Cellulitis of left lower limb: Secondary | ICD-10-CM | POA: Diagnosis present

## 2015-01-24 DIAGNOSIS — I4891 Unspecified atrial fibrillation: Secondary | ICD-10-CM | POA: Diagnosis present

## 2015-01-24 DIAGNOSIS — I5022 Chronic systolic (congestive) heart failure: Secondary | ICD-10-CM | POA: Diagnosis present

## 2015-01-24 DIAGNOSIS — A4152 Sepsis due to Pseudomonas: Secondary | ICD-10-CM | POA: Diagnosis not present

## 2015-01-24 DIAGNOSIS — R0600 Dyspnea, unspecified: Secondary | ICD-10-CM

## 2015-01-24 DIAGNOSIS — A419 Sepsis, unspecified organism: Secondary | ICD-10-CM

## 2015-01-24 LAB — COMPREHENSIVE METABOLIC PANEL
ALBUMIN: 2.4 g/dL — AB (ref 3.5–5.0)
ALK PHOS: 65 U/L (ref 38–126)
ALT: 15 U/L (ref 14–54)
ANION GAP: 10 (ref 5–15)
AST: 33 U/L (ref 15–41)
BILIRUBIN TOTAL: 1.1 mg/dL (ref 0.3–1.2)
BUN: 22 mg/dL — AB (ref 6–20)
CALCIUM: 8 mg/dL — AB (ref 8.9–10.3)
CO2: 24 mmol/L (ref 22–32)
Chloride: 103 mmol/L (ref 101–111)
Creatinine, Ser: 1.15 mg/dL — ABNORMAL HIGH (ref 0.44–1.00)
GFR calc Af Amer: 50 mL/min — ABNORMAL LOW (ref >60)
GFR calc non Af Amer: 43 mL/min — ABNORMAL LOW (ref >60)
GLUCOSE: 111 mg/dL — AB (ref 65–99)
Potassium: 3.9 mmol/L (ref 3.5–5.1)
SODIUM: 137 mmol/L (ref 135–145)
TOTAL PROTEIN: 5.9 g/dL — AB (ref 6.5–8.1)

## 2015-01-24 LAB — URINE CULTURE

## 2015-01-24 LAB — PROTIME-INR
INR: 3.2 — ABNORMAL HIGH (ref 0.00–1.49)
PROTHROMBIN TIME: 32.2 s — AB (ref 11.6–15.2)

## 2015-01-24 LAB — CBC
HEMATOCRIT: 29.5 % — AB (ref 36.0–46.0)
HEMOGLOBIN: 9.3 g/dL — AB (ref 12.0–15.0)
MCH: 30.8 pg (ref 26.0–34.0)
MCHC: 31.5 g/dL (ref 30.0–36.0)
MCV: 97.7 fL (ref 78.0–100.0)
Platelets: 125 10*3/uL — ABNORMAL LOW (ref 150–400)
RBC: 3.02 MIL/uL — ABNORMAL LOW (ref 3.87–5.11)
RDW: 15.3 % (ref 11.5–15.5)
WBC: 13.7 10*3/uL — ABNORMAL HIGH (ref 4.0–10.5)

## 2015-01-24 LAB — LACTIC ACID, PLASMA
Lactic Acid, Venous: 1 mmol/L (ref 0.5–2.0)
Lactic Acid, Venous: 1.4 mmol/L (ref 0.5–2.0)

## 2015-01-24 LAB — TROPONIN I: TROPONIN I: 0.46 ng/mL — AB (ref <0.031)

## 2015-01-24 MED ORDER — MAGNESIUM SULFATE 2 GM/50ML IV SOLN
2.0000 g | Freq: Once | INTRAVENOUS | Status: AC
Start: 2015-01-24 — End: 2015-01-24
  Administered 2015-01-24: 2 g via INTRAVENOUS
  Filled 2015-01-24: qty 50

## 2015-01-24 MED ORDER — LEVOTHYROXINE SODIUM 75 MCG PO TABS
37.5000 ug | ORAL_TABLET | Freq: Every day | ORAL | Status: DC
  Administered 2015-01-25 – 2015-01-28 (×4): 37.5 ug via ORAL
  Filled 2015-01-24 (×4): qty 1

## 2015-01-24 MED ORDER — SODIUM CHLORIDE 0.9 % IV SOLN
INTRAVENOUS | Status: DC
  Administered 2015-01-24: 21:00:00 via INTRAVENOUS

## 2015-01-24 NOTE — Clinical Social Work Note (Signed)
Clinical Social Work Assessment  Patient Details  Name: Kristina Diaz MRN: [id number] Date of Birth: 1929/10/14  Date of referral:  01/24/15               Reason for consult:  Facility Placement                Permission sought to share information with:  Oceanographer granted to share information::  Yes, Verbal Permission Granted  Name::        Agency::  [facility]  Relationship::     Contact Information:     Housing/Transportation Living arrangements for the past 2 months:  Assisted Dealer of Information:  Patient Patient Interpreter Needed:  None Criminal Activity/Legal Involvement Pertinent to Current Situation/Hospitalization:  No - Comment as needed Significant Relationships:  Spouse Lives with:  Facility Resident Do you feel safe going back to the place where you live?  Yes Need for family participation in patient care:  No (Coment)  Care giving concerns:  Pt admitted from facility   Social Worker assessment / plan:  CSW spoke with pt about admission from facility. Pt informed CSW she has been a resident at [facility] with her husband since 2014. Pt expressed she is happy with facility and plans to return at dc. CSW did inform pt that CSW will need to confirm at discharge that pt does not have an changes in level of care that would case facility to decline for pt to return. In this case, pt will need SNF for short term. Pt understanding of this. Pt informed CSW prior to admission she used a walker or wheelchair for transport. Pt independent with transfers in/out of wheelchair. Pt confirms she was independent with ADLs. CSW notified pt that therapy will evaluate here to confirm pt is okay to return and to evaluate any home health needs as well. Pt understanding and agreeable.   Employment status:  Retired Health and safety inspector:  Medicare PT Recommendations:  Not assessed at this time Information / Referral to community  resources:   (None needed at this time)  Patient/Family's Response to care:  Pt agreeable to plan of care and hopefully to return to ALF  Patient/Family's Understanding of and Emotional Response to Diagnosis, Current Treatment, and Prognosis:  Pt has good insight on her condition. Pt in good spirits during CSW visit.  Emotional Assessment Appearance:  Appears stated age Attitude/Demeanor/Rapport:  Other (Cooperative) Affect (typically observed):  Pleasant Orientation:  Oriented to Self, Oriented to Place, Oriented to  Time, Oriented to Situation Alcohol / Substance use:  Not Applicable Psych involvement (Current and /or in the community):  No (Comment)  Discharge Needs  Concerns to be addressed:  Discharge Planning Concerns Readmission within the last 30 days:  No Current discharge risk:  None Barriers to Discharge:  Continued Medical Work up   [employer], LCSW [phone]

## 2015-01-24 NOTE — Progress Notes (Signed)
[place] TEAM 1 - Stepdown/ICU TEAM Progress Note  Kristina Diaz WUJ:[id number] DOB: 24-Dec-1929 DOA: 01/23/2015 PCP: [name], MD  Admit HPI / Brief Narrative: 81 year old WF PMHx PAD,  Chronic A.- fib , Lt lower extremity DVT, Chronic Systolic CHF on torsemide, presents with left lower extremity redness and ulceration. Patient lives at [facility]   Developed a fever of 102, tachycardia, tachypnea, and transferred to the ER today for further evaluation. Patient is evaluated and followed at the wound care clinic on a regular basis for her left lower extremity ulcer which she's had for the last 2 months. She presents with worsening pain and left lower extremity erythema. Patient states that she is able to bear weight on her left lower extremity but her mobility has been somewhat limited because of L3-L4 fracture and she has been taking OxyContin for her back pain. In the ER the patient is febrile with a temperature to 103.1, tachycardic in A. fib with RVR with a pulse of 138. White count 12,000. INR 2.8  HPI/Subjective: 9/29 A/O 4, states is mobile at her assisted living facility with walker and wheelchair. Lives with her husband.   Assessment/Plan: Sepsis unspecified organism -likely secondary to left lower extremity cellulitis/left lower extremity ulcer, probable myositis. See MRI left lower extremity results below -Lactic acid normalized, however elevated temperature -Leukocytosis trending down -Continue normal saline 22ml/hr -Hold diuretic  Chronic Systolic CHF, -Strict I&O -Daily a.m. weight standing  Atrial fibrillation-continue anticoagulation per pharmacy, start the patient on a Cardizem drip, hold metoprolol  Hypertension -Metoprolol 12.5 mg  BID (note patient BP extremely sensitive to BP medication) -Echocardiogram; see results below -Hold diuretic -Slightly elevated most likely secondary to demand ischemia; continue cycle  Hypothyroidism -continue  Synthroid 37.5 mg daily - TSH WNL    Code Status: FULL Family Communication: no family present at time of exam Disposition Plan: Resolution sepsis    Consultants:   Procedure/Significant Events: 9/28 MRI left tib-fib without contrast; -1. Abnormal asymmetric appearance of atrophy and low-level edema in posterior muscle groups in the left calf, the inflammation could possibly be from myositis, -Low-level but asymmetric to the left subcutaneous edema cellulitis or venous stasis. -No abscess or osteomyelitis. 9/29 echocardiogram;LVEF= 45% to 50%. Regional wall motion abnormalities cannot be excluded. - Ventricular septum: Septal motion showed paradox. - Right atrium: moderately dilated.- Tricuspid valve:moderate regurgitation.    Culture   Antibiotics: Zosyn 9/28>> Vancomycin 9/28>>  DVT prophylaxis: Warfarin   Devices    LINES / TUBES:      Continuous Infusions: . sodium chloride      Objective: VITAL SIGNS: Temp: 99.5 F (37.5 C) (09/29 1914) Temp Source: Oral (09/29 1914) BP: 97/42 mmHg (09/29 1914) Pulse Rate: 60 (09/29 1914) SPO2; FIO2:   Intake/Output Summary (Last 24 hours) at 01/24/15 2055 Last data filed at 01/24/15 1226  Gross per 24 hour  Intake   1050 ml  Output    500 ml  Net    550 ml     Exam: General: A/O 4, NAD, No acute respiratory distress Eyes: Negative headache, eye pain, double vision,negative scleral hemorrhage ENT: Negative Runny nose, negative ear pain, negative gingival bleeding, Neck:  Negative scars, masses, torticollis, lymphadenopathy, JVD Lungs: Clear to auscultation bilaterally positive by basilar crackles Cardiovascular: Irregular irregular rhythm and rate , negative murmur, negative gallop or rub, normal S1 and loud S2 Abdomen:negative abdominal pain, nondistended, positive soft, bowel sounds, no rebound, no ascites, no appreciable mass Extremities: No significant cyanosis, clubbing, or  edema right lower  extremity, left lower extremity new lead dressed (did not take down dressing) Psychiatric:  Negative depression, negative anxiety, negative fatigue, negative mania Neurologic:  Cranial nerves II through XII intact, tongue/uvula midline, all extremities muscle strength 5/5, sensation intact throughout, negative dysarthria, negative expressive aphasia, negative receptive aphasia.   Data Reviewed: Basic Metabolic Panel:  Recent Labs Lab 01/23/15 1003 01/23/15 1246 01/24/15 0224  NA 141 140 137  K 3.9 3.7 3.9  CL 101 104 103  CO2 GLUCOSE 95 104* 111*  BUN 19 18 22*  CREATININE 1.10* 1.07* 1.15*  CALCIUM 9.2 8.3* 8.0*  MG  --  1.7  --    Liver Function Tests:  Recent Labs Lab 01/23/15 1003 01/23/15 1246 01/24/15 0224  AST 33 39 33  ALT 13* 16 15  ALKPHOS 77 80 65  BILITOT 1.6* 1.9* 1.1  PROT 8.0 6.8 5.9*  ALBUMIN 3.2* 2.7* 2.4*   No results for input(s): LIPASE, AMYLASE in the last 168 hours. No results for input(s): AMMONIA in the last 168 hours. CBC:  Recent Labs Lab 01/23/15 1003 01/23/15 1246 01/24/15 0224  WBC 12.0* 14.1* 13.7*  NEUTROABS 11.0* 12.8*  --   HGB 11.5* 10.3* 9.3*  HCT 35.3* 32.4* 29.5*  MCV 97.8 97.3 97.7  PLT 169 148* 125*   Cardiac Enzymes:  Recent Labs Lab 01/23/15 1246 01/23/15 1808  TROPONINI 0.11* 0.25*   BNP (last 3 results) No results for input(s): BNP in the last 8760 hours.  ProBNP (last 3 results) No results for input(s): PROBNP in the last 8760 hours.  CBG: No results for input(s): GLUCAP in the last 168 hours.  Recent Results (from the past 240 hour(s))  Culture, blood (routine x 2)     Status: None (Preliminary result)   Collection Time: 01/23/15 10:00 AM  Result Value Ref Range Status   Specimen Description BLOOD RIGHT FOREARM  Final   Special Requests BOTTLES DRAWN AEROBIC AND ANAEROBIC 5CC  Final   Culture  Setup Time   Final    GRAM NEGATIVE RODS AEROBIC BOTTLE ONLY CRITICAL RESULT CALLED TO, READ  BACK BY AND VERIFIED WITH: M NORMENT  01/24/15 MKELLY    Culture NO GROWTH 1 DAY  Final   Report Status PENDING  Incomplete  Culture, blood (routine x 2)     Status: None (Preliminary result)   Collection Time: 01/23/15 10:03 AM  Result Value Ref Range Status   Specimen Description BLOOD LEFT FOREARM  Final   Special Requests BOTTLES DRAWN AEROBIC AND ANAEROBIC 5CC  Final   Culture  Setup Time   Final    GRAM NEGATIVE RODS AEROBIC BOTTLE ONLY CRITICAL RESULT CALLED TO, READ BACK BY AND VERIFIED WITH: M NORMNET @ 8295 01/24/15 MKELLY    Culture NO GROWTH 1 DAY  Final   Report Status PENDING  Incomplete  Urine culture     Status: None   Collection Time: 01/23/15 10:14 AM  Result Value Ref Range Status   Specimen Description URINE, RANDOM  Final   Special Requests NONE  Final   Culture MULTIPLE SPECIES PRESENT, SUGGEST RECOLLECTION  Final   Report Status 01/24/2015 FINAL  Final  MRSA PCR Screening     Status: None   Collection Time: 01/23/15  5:22 PM  Result Value Ref Range Status   MRSA by PCR NEGATIVE NEGATIVE Final    Comment:        The GeneXpert MRSA Assay (FDA approved for NASAL  specimens only), is one component of a comprehensive MRSA colonization surveillance program. It is not intended to diagnose MRSA infection nor to guide or monitor treatment for MRSA infections.      Studies:  Recent x-ray studies have been reviewed in detail by the Attending Physician  Scheduled Meds:  Scheduled Meds: . collagenase   Topical Daily  . docusate sodium  100 mg Oral BID  . [START ON 01/25/2015] levothyroxine  37.5 mcg Oral QAC breakfast  . metoprolol tartrate  12.5 mg Oral BID  . OxyCODONE  10 mg Oral Q12H  . piperacillin-tazobactam (ZOSYN)  IV  3.375 g Intravenous 3 times per day  . vancomycin  750 mg Intravenous Q24H  . Warfarin - Pharmacist Dosing Inpatient   Does not apply q1800    Time spent on care of this patient: 40 mins   WOODS, Roselind Messier , MD  [facility] Office  [phone] Pager - [phone]  On-Call/Text Page:      [email or web address]      password [email or web address]  If 7PM-7AM, please contact night-coverage [email or web address] Password [email or web address] 01/24/2015, 8:55 PM   LOS: 1 day   Care during the described time interval was provided by me .  I have reviewed this patient's available data, including medical history, events of note, physical examination, and all test results as part of my evaluation. I have personally reviewed and interpreted all radiology studies.   Carolyne Littles, MD [phone] Pager

## 2015-01-24 NOTE — Progress Notes (Signed)
VASCULAR LAB PRELIMINARY  ARTERIAL  ABI completed:    RIGHT    LEFT    PRESSURE WAVEFORM  PRESSURE WAVEFORM  BRACHIAL 114 Triphasic  BRACHIAL 112 Triphasic   DP 126 Triphasic  DP absent   AT   AT    PT 127 .vlti  PT absent   PER   PER    GREAT TOE 66 Within normal limits GREAT TOE flat     RIGHT LEFT  ABI 1.11 absent     Nicholas Trompeter, RVT 01/24/2015, 12:27 PM

## 2015-01-24 NOTE — Progress Notes (Signed)
Echocardiogram 2D Echocardiogram has been performed.  Dorothey Baseman 01/24/2015, 9:13 AM

## 2015-01-24 NOTE — Progress Notes (Signed)
CSW (Clinical SociaChild psychotherapistreceived consult and aware pt is from [facility]. Please place PT/OT consult when appropriate to confirm pt is appropriate to return to this level of care. CSW assessment to follow.  Poonum Ambelal, LCSW [phone]

## 2015-01-24 NOTE — Care Management Note (Signed)
Case Management Note  Patient Details  Name: Kristina Diaz MRN: [id number] Date of Birth: 1929-10-30  Subjective/Objective:   Adm w sepsis                 Action/Plan: from [facility], sw ref placed  Expected Discharge Date:                 Expected Discharge Plan:  Assisted Living / Rest Home  In-House Referral:  Clinical Social Work  Discharge planning Services     Post Acute Care Choice:    Choice offered to:     DME Arranged:    DME Agency:     HH Arranged:    HH Agency:     Status of Service:     Medicare Important Message Given:    Date Medicare IM Given:    Medicare IM give by:    Date Additional Medicare IM Given:    Additional Medicare Important Message give by:     If discussed at Long Length of Stay Meetings, dates discussed:    Additional Comments: ur review done  Hanley Hays, RN 01/24/2015, 7:34 AM

## 2015-01-24 NOTE — Progress Notes (Signed)
ANTICOAGULATION CONSULT NOTE - Follow Up Consult  Pharmacy Consult for Coumadin Indication: hx afib/DVT  Allergies  Allergen Reactions  . Bactrim Ds [Sulfamethoxazole-Trimethoprim] Rash  . Tape Rash    Patient Measurements: Height:  (160 cm) Weight: 126 lb 15.8 oz (57.6 kg) IBW/kg (Calculated) : 52.4  Vital Signs: Temp: 99.3 F (37.4 C) (09/29 0732) Temp Source: Oral (09/29 0732) BP: 122/56 mmHg (09/29 0732) Pulse Rate: 114 (09/29 0732)  Labs:  Recent Labs  01/23/15 1003 01/23/15 1246 01/23/15 1808 01/24/15 0224  HGB 11.5* 10.3*  --  9.3*  HCT 35.3* 32.4*  --  29.5*  PLT 169 148*  --  125*  APTT  --  40*  --   --   LABPROT 29.0* 29.8*  --  32.2*  INR 2.80* 2.89*  --  3.20*  CREATININE 1.10* 1.07*  --  1.15*  TROPONINI  --  0.11* 0.25*  --     Estimated Creatinine Clearance: 30.7 mL/min (by C-G formula based on Cr of 1.15).  Assessment: 85yof continues on coumadin for hx afib/DVT. INR therapeutic on admit at 2.89 and home dose continued. INR is above goal today at 3.2. CBC stable. No bleeding reported.  Home dose: 4.5mg  daily except  on Tues/Fri  Goal of Therapy:  INR 2-3 Monitor platelets by anticoagulation protocol: Yes   Plan:  1) No coumadin tonight 2) Daily INR  Fredrik Rigger 01/24/2015,9:59 AM

## 2015-01-25 DIAGNOSIS — L8990 Pressure ulcer of unspecified site, unspecified stage: Secondary | ICD-10-CM | POA: Insufficient documentation

## 2015-01-25 LAB — CBC WITH DIFFERENTIAL/PLATELET
BASOS ABS: 0 10*3/uL (ref 0.0–0.1)
BASOS PCT: 0 %
EOS ABS: 0.1 10*3/uL (ref 0.0–0.7)
Eosinophils Relative: 1 %
HCT: 28.4 % — ABNORMAL LOW (ref 36.0–46.0)
HEMOGLOBIN: 9.4 g/dL — AB (ref 12.0–15.0)
Lymphocytes Relative: 7 %
Lymphs Abs: 0.6 10*3/uL — ABNORMAL LOW (ref 0.7–4.0)
MCH: 32.4 pg (ref 26.0–34.0)
MCHC: 33.1 g/dL (ref 30.0–36.0)
MCV: 97.9 fL (ref 78.0–100.0)
Monocytes Absolute: 0.8 10*3/uL (ref 0.1–1.0)
Monocytes Relative: 11 %
NEUTROS PCT: 81 %
Neutro Abs: 6.5 10*3/uL (ref 1.7–7.7)
Platelets: 119 10*3/uL — ABNORMAL LOW (ref 150–400)
RBC: 2.9 MIL/uL — AB (ref 3.87–5.11)
RDW: 15.4 % (ref 11.5–15.5)
WBC: 8 10*3/uL (ref 4.0–10.5)

## 2015-01-25 LAB — COMPREHENSIVE METABOLIC PANEL
ALK PHOS: 63 U/L (ref 38–126)
ALT: 21 U/L (ref 14–54)
AST: 41 U/L (ref 15–41)
Albumin: 2.4 g/dL — ABNORMAL LOW (ref 3.5–5.0)
Anion gap: 6 (ref 5–15)
BUN: 16 mg/dL (ref 6–20)
CALCIUM: 8.3 mg/dL — AB (ref 8.9–10.3)
CHLORIDE: 106 mmol/L (ref 101–111)
CO2: 28 mmol/L (ref 22–32)
CREATININE: 1.09 mg/dL — AB (ref 0.44–1.00)
GFR, EST AFRICAN AMERICAN: 53 mL/min — AB (ref >60)
GFR, EST NON AFRICAN AMERICAN: 46 mL/min — AB (ref >60)
Glucose, Bld: 94 mg/dL (ref 65–99)
Potassium: 3.8 mmol/L (ref 3.5–5.1)
Sodium: 140 mmol/L (ref 135–145)
Total Bilirubin: 0.7 mg/dL (ref 0.3–1.2)
Total Protein: 6 g/dL — ABNORMAL LOW (ref 6.5–8.1)

## 2015-01-25 LAB — TROPONIN I
TROPONIN I: 0.38 ng/mL — AB (ref <0.031)
Troponin I: 0.3 ng/mL — ABNORMAL HIGH (ref <0.031)

## 2015-01-25 LAB — MAGNESIUM: MAGNESIUM: 2.4 mg/dL (ref 1.7–2.4)

## 2015-01-25 LAB — PROTIME-INR
INR: 2.98 — AB (ref 0.00–1.49)
Prothrombin Time: 30.4 seconds — ABNORMAL HIGH (ref 11.6–15.2)

## 2015-01-25 MED ORDER — WARFARIN SODIUM 2 MG PO TABS
3.0000 mg | ORAL_TABLET | Freq: Once | ORAL | Status: AC
  Administered 2015-01-25: 3 mg via ORAL
  Filled 2015-01-25: qty 1.5

## 2015-01-25 NOTE — Progress Notes (Signed)
Pt called RN to room shortly after lab left to draw the 2210 timed torponin. Pt pointed out an area on her left forearm that had began bruising and itching shortly after the lab draw. RN noticed there to be a 2-3 inch length raised hematoma. RN marked site with marker, applied a pressure dressing, and raised pts arm. RN will inform the next lab draw tech to apply pressure for longer and to use the smallest needle possible to prevent further hematomas. RN will continue to monitor.

## 2015-01-25 NOTE — Progress Notes (Signed)
ANTICOAGULATION CONSULT NOTE - Follow Up Consult  Pharmacy Consult for Coumadin Indication: hx afib/DVT  Allergies  Allergen Reactions  . Bactrim Ds [Sulfamethoxazole-Trimethoprim] Rash  . Tape Rash    Patient Measurements: Height:  (160 cm) Weight: 133 lb 9.6 oz (60.6 kg) IBW/kg (Calculated) : 52.4  Vital Signs: Temp: 98 F (36.7 C) (09/30 0329) Temp Source: Oral (09/30 0329) BP: 116/56 mmHg (09/30 0800) Pulse Rate: 79 (09/30 0800)  Labs:  Recent Labs  01/23/15 1246 01/23/15 1808 01/24/15 0224 01/24/15 2210 01/25/15 0228  HGB 10.3*  --  9.3*  --  9.4*  HCT 32.4*  --  29.5*  --  28.4*  PLT 148*  --  125*  --  119*  APTT 40*  --   --   --   --   LABPROT 29.8*  --  32.2*  --  30.4*  INR 2.89*  --  3.20*  --  2.98*  CREATININE 1.07*  --  1.15*  --  1.09*  TROPONINI 0.11* 0.25*  --  0.46* 0.38*    Estimated Creatinine Clearance: 32.3 mL/min (by C-G formula based on Cr of 1.09).  Assessment: 85yof continues on coumadin for hx afib/DVT. INR therapeutic on admit at 2.89 and home dose continued. INR was above goal yesterday and dose held. INR back within range today at 2.98. CBC stable except platelets a little low. LFTs ok. No bleeding reported.  Home dose: 4.5mg  daily except  on Tues/Fri  Goal of Therapy:  INR 2-3 Monitor platelets by anticoagulation protocol: Yes   Plan:  1) Coumadin  x 1 2) Daily INR  Fredrik Rigger 01/25/2015,8:30 AM

## 2015-01-25 NOTE — Progress Notes (Signed)
[place] TEAM 1 - Stepdown/ICU TEAM PROGRESS NOTE  Kristina Diaz UJW:[id number] DOB: [birth year] DOA: 01/23/2015 PCP: [name], MD  Admit HPI / Brief Narrative: 81 year old F Hx PAD,Chronic Afib, Lt lower extremity DVT, and Chronic Systolic CHF on torsemide, who presented with left lower extremity redness and ulceration. Patient lives at [facility].  She developed a fever of 102, tachycardia, tachypnea, and was transferred to the ER for further evaluation. Patient is followed at the wound care clinic on a regular basis for her left lower extremity ulcer which she's had for 2 months.   In the ER the patient was febrile with a temperature to 103.1, tachycardic in A. fib with RVR with a pulse of 138. White count 12,000. INR 2.8  HPI/Subjective: The patient is awake and alert and states she feels much better in general.  She denies fevers chills nausea vomiting shortness of breath abdominal pain or chest pain.  She denies significant pain in her left lower extremity.  Pt states she has had extensive vascular studies via her prior medical team in [place].    Assessment/Plan:  Sepsis due to LLE Pseudomonas cellulitis w/ mysositis + bacteremia  -secondary to left lower extremity cellulitis/left lower extremity ulcer w/ myositis -Lactic acid normalized -Leukocytosis trending down - clinically stabilizing  -f/u sensitivities of Pseudomonas   Chronic Systolic CHF -TTE notes EF 45-50% 9/29 - no evidence of volume overload at this time   Chronic Atrial fibrillation w/ acute RVR  -rate now well controlled - warfarin per Pharmacy - likely exacerbated by septis state - attempt to wean back to oral rate control meds   Hypertension -BP well controlled   Hypothyroidism -continue Synthroid 37.5 mg daily -TSH WNL  Code Status: FULL Family Communication: no family present at time of exam Disposition Plan: SDU   Consultants: none  Procedures: 9/28 MRI left tib-fib without  contrast - Abnormal asymmetric appearance of atrophy and low-level edema in posterior muscle groups in the left calf, the inflammation could possibly be from myositis - No abscess or osteomyelitis 9/29 TTE EF 45% to 50%. Regional wall motion abnormalities cannot be excluded 9/29 ABIs - R 1.11 - L absent   Antibiotics: Zosyn 9/28> Vancomycin 9/28>  DVT prophylaxis: warfarin  Objective: Blood pressure 100/48, pulse 71, temperature 97.8 F (36.6 C), temperature source Oral, resp. rate 18, height  (1.6 m), weight 60.6 kg (133 lb 9.6 oz), SpO2 98 %.  Intake/Output Summary (Last 24 hours) at 01/25/15 1349 Last data filed at 01/25/15 1030  Gross per 24 hour  Intake 939.17 ml  Output    800 ml  Net 139.17 ml   Exam: General: No acute respiratory distress Lungs: Clear to auscultation bilaterally without wheezes or crackles Cardiovascular: Regular rate and rhythm without murmur gallop or rub normal S1 and S2 Abdomen: Nontender, nondistended, soft, bowel sounds positive, no rebound, no ascites, no appreciable mass Extremities: No significant cyanosis, clubbing, or edema bilateral lower extremities - L LE dressed and dry at present   Data Reviewed: Basic Metabolic Panel:  Recent Labs Lab 01/23/15 1003 01/23/15 1246 01/24/15 0224 01/25/15 0228  NA 141 140 137 140  K 3.9 3.7 3.9 3.8  CL 101 104 103 106  CO2 GLUCOSE 95 104* 111* 94  BUN 19 18 22* 16  CREATININE 1.10* 1.07* 1.15* 1.09*  CALCIUM 9.2 8.3* 8.0* 8.3*  MG  --  1.7  --  2.4    CBC:  Recent Labs Lab  01/23/15 1003 01/23/15 1246 01/24/15 0224 01/25/15 0228  WBC 12.0* 14.1* 13.7* 8.0  NEUTROABS 11.0* 12.8*  --  6.5  HGB 11.5* 10.3* 9.3* 9.4*  HCT 35.3* 32.4* 29.5* 28.4*  MCV 97.8 97.3 97.7 97.9  PLT 169 148* 125* 119*    Liver Function Tests:  Recent Labs Lab 01/23/15 1003 01/23/15 1246 01/24/15 0224 01/25/15 0228  AST 33 39 33 41  ALT 13* ALKPHOS 77 80 65 63  BILITOT  1.6* 1.9* 1.1 0.7  PROT 8.0 6.8 5.9* 6.0*  ALBUMIN 3.2* 2.7* 2.4* 2.4*    Coags:  Recent Labs Lab 01/23/15 1003 01/23/15 1246 01/24/15 0224 01/25/15 0228  INR 2.80* 2.89* 3.20* 2.98*    Recent Labs Lab 01/23/15 1246  APTT 40*    Cardiac Enzymes:  Recent Labs Lab 01/23/15 1246 01/23/15 1808 01/24/15 2210 01/25/15 0228 01/25/15 0834  TROPONINI 0.11* 0.25* 0.46* 0.38* 0.30*    Recent Results (from the past 240 hour(s))  Culture, blood (routine x 2)     Status: None (Preliminary result)   Collection Time: 01/23/15 10:00 AM  Result Value Ref Range Status   Specimen Description BLOOD RIGHT FOREARM  Final   Special Requests BOTTLES DRAWN AEROBIC AND ANAEROBIC 5CC  Final   Culture  Setup Time   Final    GRAM NEGATIVE RODS AEROBIC BOTTLE ONLY CRITICAL RESULT CALLED TO, READ BACK BY AND VERIFIED WITH: M NORMENT  01/24/15 MKELLY    Culture PSEUDOMONAS AERUGINOSA  Final   Report Status PENDING  Incomplete  Culture, blood (routine x 2)     Status: None (Preliminary result)   Collection Time: 01/23/15 10:03 AM  Result Value Ref Range Status   Specimen Description BLOOD LEFT FOREARM  Final   Special Requests BOTTLES DRAWN AEROBIC AND ANAEROBIC 5CC  Final   Culture  Setup Time   Final    GRAM NEGATIVE RODS AEROBIC BOTTLE ONLY CRITICAL RESULT CALLED TO, READ BACK BY AND VERIFIED WITH: M NORMNET @ 4098 01/24/15 MKELLY    Culture PSEUDOMONAS AERUGINOSA  Final   Report Status PENDING  Incomplete  Urine culture     Status: None   Collection Time: 01/23/15 10:14 AM  Result Value Ref Range Status   Specimen Description URINE, RANDOM  Final   Special Requests NONE  Final   Culture MULTIPLE SPECIES PRESENT, SUGGEST RECOLLECTION  Final   Report Status 01/24/2015 FINAL  Final  MRSA PCR Screening     Status: None   Collection Time: 01/23/15  5:22 PM  Result Value Ref Range Status   MRSA by PCR NEGATIVE NEGATIVE Final    Comment:        The GeneXpert MRSA Assay  (FDA approved for NASAL specimens only), is one component of a comprehensive MRSA colonization surveillance program. It is not intended to diagnose MRSA infection nor to guide or monitor treatment for MRSA infections.      Studies:   Recent x-ray studies have been reviewed in detail by the Attending Physician  Scheduled Meds:  Scheduled Meds: . collagenase   Topical Daily  . docusate sodium  100 mg Oral BID  . levothyroxine  37.5 mcg Oral QAC breakfast  . metoprolol tartrate  12.5 mg Oral BID  . OxyCODONE  10 mg Oral Q12H  . piperacillin-tazobactam (ZOSYN)  IV  3.375 g Intravenous 3 times per day  . vancomycin  750 mg Intravenous Q24H  . warfarin  3 mg Oral ONCE-1800  . Warfarin -  Pharmacist Dosing Inpatient   Does not apply q1800    Time spent on care of this patient: 35 mins   [facility] T , MD   [facility] Office  [phone] Pager - Text Page per Amion as per below:  On-Call/Text Page:      [email or web address]      password [email or web address]  If 7PM-7AM, please contact night-coverage [email or web address] Password [email or web address] 01/25/2015, 1:49 PM   LOS: 2 days

## 2015-01-26 DIAGNOSIS — E038 Other specified hypothyroidism: Secondary | ICD-10-CM

## 2015-01-26 LAB — PROTIME-INR
INR: 3.2 — AB (ref 0.00–1.49)
PROTHROMBIN TIME: 32.1 s — AB (ref 11.6–15.2)

## 2015-01-26 LAB — CBC
HEMATOCRIT: 29.2 % — AB (ref 36.0–46.0)
HEMOGLOBIN: 9.5 g/dL — AB (ref 12.0–15.0)
MCH: 31.9 pg (ref 26.0–34.0)
MCHC: 32.5 g/dL (ref 30.0–36.0)
MCV: 98 fL (ref 78.0–100.0)
Platelets: 118 10*3/uL — ABNORMAL LOW (ref 150–400)
RBC: 2.98 MIL/uL — AB (ref 3.87–5.11)
RDW: 15.4 % (ref 11.5–15.5)
WBC: 5.4 10*3/uL (ref 4.0–10.5)

## 2015-01-26 LAB — CULTURE, BLOOD (ROUTINE X 2)

## 2015-01-26 LAB — COMPREHENSIVE METABOLIC PANEL
ALBUMIN: 2.3 g/dL — AB (ref 3.5–5.0)
ALT: 18 U/L (ref 14–54)
ANION GAP: 9 (ref 5–15)
AST: 33 U/L (ref 15–41)
Alkaline Phosphatase: 58 U/L (ref 38–126)
BILIRUBIN TOTAL: 0.5 mg/dL (ref 0.3–1.2)
BUN: 15 mg/dL (ref 6–20)
CALCIUM: 8.2 mg/dL — AB (ref 8.9–10.3)
CO2: 24 mmol/L (ref 22–32)
Chloride: 106 mmol/L (ref 101–111)
Creatinine, Ser: 1.04 mg/dL — ABNORMAL HIGH (ref 0.44–1.00)
GFR calc non Af Amer: 48 mL/min — ABNORMAL LOW (ref >60)
GFR, EST AFRICAN AMERICAN: 56 mL/min — AB (ref >60)
GLUCOSE: 85 mg/dL (ref 65–99)
POTASSIUM: 3.5 mmol/L (ref 3.5–5.1)
SODIUM: 139 mmol/L (ref 135–145)
TOTAL PROTEIN: 6.1 g/dL — AB (ref 6.5–8.1)

## 2015-01-26 LAB — CK: Total CK: 109 U/L (ref 38–234)

## 2015-01-26 MED ORDER — TORSEMIDE 20 MG PO TABS
20.0000 mg | ORAL_TABLET | Freq: Every day | ORAL | Status: DC
  Administered 2015-01-26 – 2015-01-28 (×3): 20 mg via ORAL
  Filled 2015-01-26 (×3): qty 1

## 2015-01-26 MED ORDER — METOPROLOL TARTRATE 12.5 MG HALF TABLET
12.5000 mg | ORAL_TABLET | Freq: Two times a day (BID) | ORAL | Status: DC
  Administered 2015-01-26 – 2015-01-28 (×3): 12.5 mg via ORAL
  Filled 2015-01-26 (×5): qty 1

## 2015-01-26 MED ORDER — LEVALBUTEROL HCL 0.63 MG/3ML IN NEBU: 0.6300 mg | INHALATION_SOLUTION | RESPIRATORY_TRACT | Status: DC | PRN

## 2015-01-26 MED ORDER — TORSEMIDE 20 MG PO TABS: 20.0000 mg | ORAL_TABLET | ORAL | Status: DC

## 2015-01-26 MED ORDER — ADULT MULTIVITAMIN W/MINERALS CH
1.0000 | ORAL_TABLET | Freq: Every morning | ORAL | Status: DC
  Administered 2015-01-26 – 2015-01-28 (×3): 1 via ORAL
  Filled 2015-01-26 (×3): qty 1

## 2015-01-26 MED ORDER — POTASSIUM CHLORIDE CRYS ER 10 MEQ PO TBCR
10.0000 meq | EXTENDED_RELEASE_TABLET | Freq: Two times a day (BID) | ORAL | Status: DC
  Administered 2015-01-26 – 2015-01-28 (×5): 10 meq via ORAL
  Filled 2015-01-26 (×5): qty 1

## 2015-01-26 MED ORDER — DOCUSATE SODIUM 100 MG PO CAPS: 100.0000 mg | ORAL_CAPSULE | Freq: Every day | ORAL | Status: DC

## 2015-01-26 NOTE — Progress Notes (Signed)
[place] TEAM 1 - Stepdown/ICU TEAM PROGRESS NOTE  Kadesia Robel WUJ:[id number] DOB: November 21, 1929 DOA: 01/23/2015 PCP: [name], MD  Admit HPI / Brief Narrative: 81 year old F Hx PAD,Chronic Afib, Lt lower extremity DVT, and Chronic Systolic CHF on torsemide, who presented with left lower extremity redness and ulceration. Patient lives at [facility].  She developed a fever of 102, tachycardia, tachypnea, and was transferred to the ER for further evaluation. Patient is followed at the wound care clinic on a regular basis for her left lower extremity ulcer which she's had for 2 months.   In the ER the patient was febrile with a temperature to 103.1, tachycardic in A. fib with RVR with a pulse of 138. White count 12,000. INR 2.8  HPI/Subjective: The pt is feeling better in general.  She denies cp, sob, n/v, or abdom pain.  She admits to some modest intermittent pain in the L LE.    Assessment/Plan:  Sepsis due to LLE Pseudomonas cellulitis w/ mysositis + bacteremia  -secondary to left lower extremity cellulitis/left lower extremity ulcer w/ myositis -Lactic acid normalized -Leukocytosis was normalized  -Pseudomonas appears to be pan sensitive, but choice of abx complicated by use of warfarin - cont w/ zosyn for now and follow clinically    Chronic Systolic CHF + Chronic R heart failure  -TTE notes EF 45-50% 9/29 - no evidence of volume overload at this time - NSL today - wgt today 62.05kg - baseline wgt appears to be 58-60kg - net +4.6L at this time - resume home diuretic and follow   Chronic Atrial fibrillation w/ acute RVR  -rate now well controlled but episodes of tachy w/ exertion - warfarin per Pharmacy - likely exacerbated by septic state - weaned back to oral rate control meds - follow HR w/ PT/OT evals  PAD Previously evaluated by Dr. Allyson Sabal - seen in his office 2014 at which time she reportedly had normal ABIs - ABIs this visit reportedly unable to be measured on L  - exam not c/w acute arterial occlusion - consider outpt referral back to Dr. Allyson Sabal to consider definitive arterial study   Hx DVT L LE 2014 s/p thrombectomy  On anticoag   Hypertension -BP well controlled   Hypothyroidism -continue Synthroid 37.5 mg daily -TSH WNL  Code Status: FULL Family Communication: no family present at time of exam Disposition Plan: stable for transfer to tele bed - PT/OT - follow rate w/ exertion   Consultants: none  Procedures: 9/28 MRI left tib-fib without contrast - Abnormal asymmetric appearance of atrophy and low-level edema in posterior muscle groups in the left calf, the inflammation could possibly be from myositis - No abscess or osteomyelitis 9/29 TTE EF 45% to 50%. Regional wall motion abnormalities cannot be excluded 9/29 ABIs - R 1.11 - L absent   Antibiotics: Zosyn 9/28> Vancomycin 9/28>9/30  DVT prophylaxis: warfarin  Objective: Blood pressure 107/59, pulse 76, temperature 98.8 F (37.1 C), temperature source Oral, resp. rate 16, height  (1.6 m), weight 62.052 kg (136 lb 12.8 oz), SpO2 98 %.  Intake/Output Summary (Last 24 hours) at 01/26/15 1135 Last data filed at 01/26/15 1000  Gross per 24 hour  Intake   1870 ml  Output    750 ml  Net   1120 ml   Exam: General: No acute respiratory distress - alert  Lungs: Clear to auscultation bilaterally without wheezes / crackles Cardiovascular: Regular rate without murmur gallop or rub  Abdomen: Nontender, nondistended, soft, bowel sounds  positive, no rebound, no ascites, no appreciable mass Extremities: No significant cyanosis, clubbing, or edema bilateral lower extremities - L LE dressed and dry at present - toes B feet pink and warm   Data Reviewed: Basic Metabolic Panel:  Recent Labs Lab 01/23/15 1003 01/23/15 1246 01/24/15 0224 01/25/15 0228 01/26/15 0250  NA 141 140 137 140 139  K 3.9 3.7 3.9 3.8 3.5  CL 101 104 103 106 106  CO2 GLUCOSE 95 104*  111* 94 85  BUN 19 18 22* 16 15  CREATININE 1.10* 1.07* 1.15* 1.09* 1.04*  CALCIUM 9.2 8.3* 8.0* 8.3* 8.2*  MG  --  1.7  --  2.4  --     CBC:  Recent Labs Lab 01/23/15 1003 01/23/15 1246 01/24/15 0224 01/25/15 0228 01/26/15 0250  WBC 12.0* 14.1* 13.7* 8.0 5.4  NEUTROABS 11.0* 12.8*  --  6.5  --   HGB 11.5* 10.3* 9.3* 9.4* 9.5*  HCT 35.3* 32.4* 29.5* 28.4* 29.2*  MCV 97.8 97.3 97.7 97.9 98.0  PLT 169 148* 125* 119* 118*    Liver Function Tests:  Recent Labs Lab 01/23/15 1003 01/23/15 1246 01/24/15 0224 01/25/15 0228 01/26/15 0250  AST 33 39 33 41 33  ALT 13* ALKPHOS 77 80 65 63 58  BILITOT 1.6* 1.9* 1.1 0.7 0.5  PROT 8.0 6.8 5.9* 6.0* 6.1*  ALBUMIN 3.2* 2.7* 2.4* 2.4* 2.3*    Coags:  Recent Labs Lab 01/23/15 1003 01/23/15 1246 01/24/15 0224 01/25/15 0228 01/26/15 0250  INR 2.80* 2.89* 3.20* 2.98* 3.20*    Recent Labs Lab 01/23/15 1246  APTT 40*    Cardiac Enzymes:  Recent Labs Lab 01/23/15 1246 01/23/15 1808 01/24/15 2210 01/25/15 0228 01/25/15 0834 01/26/15 0250  CKTOTAL  --   --   --   --   --  109  TROPONINI 0.11* 0.25* 0.46* 0.38* 0.30*  --     Recent Results (from the past 240 hour(s))  Culture, blood (routine x 2)     Status: None   Collection Time: 01/23/15 10:00 AM  Result Value Ref Range Status   Specimen Description BLOOD RIGHT FOREARM  Final   Special Requests BOTTLES DRAWN AEROBIC AND ANAEROBIC 5CC  Final   Culture  Setup Time   Final    GRAM NEGATIVE RODS AEROBIC BOTTLE ONLY CRITICAL RESULT CALLED TO, READ BACK BY AND VERIFIED WITH: M NORMENT  01/24/15 MKELLY    Culture PSEUDOMONAS AERUGINOSA  Final   Report Status 01/26/2015 FINAL  Final  Culture, blood (routine x 2)     Status: None   Collection Time: 01/23/15 10:03 AM  Result Value Ref Range Status   Specimen Description BLOOD LEFT FOREARM  Final   Special Requests BOTTLES DRAWN AEROBIC AND ANAEROBIC 5CC  Final   Culture  Setup Time   Final     GRAM NEGATIVE RODS AEROBIC BOTTLE ONLY CRITICAL RESULT CALLED TO, READ BACK BY AND VERIFIED WITH: M NORMNET @ 1610 01/24/15 MKELLY    Culture PSEUDOMONAS AERUGINOSA  Final   Report Status 01/26/2015 FINAL  Final   Organism ID, Bacteria PSEUDOMONAS AERUGINOSA  Final      Susceptibility   Pseudomonas aeruginosa - MIC*    CEFTAZIDIME <=1 SENSITIVE Sensitive     CIPROFLOXACIN <=0.25 SENSITIVE Sensitive     GENTAMICIN <=1 SENSITIVE Sensitive     IMIPENEM 1 SENSITIVE Sensitive     PIP/TAZO 8 SENSITIVE Sensitive  CEFEPIME <=1 SENSITIVE Sensitive     * PSEUDOMONAS AERUGINOSA  Urine culture     Status: None   Collection Time: 01/23/15 10:14 AM  Result Value Ref Range Status   Specimen Description URINE, RANDOM  Final   Special Requests NONE  Final   Culture MULTIPLE SPECIES PRESENT, SUGGEST RECOLLECTION  Final   Report Status 01/24/2015 FINAL  Final  MRSA PCR Screening     Status: None   Collection Time: 01/23/15  5:22 PM  Result Value Ref Range Status   MRSA by PCR NEGATIVE NEGATIVE Final    Comment:        The GeneXpert MRSA Assay (FDA approved for NASAL specimens only), is one component of a comprehensive MRSA colonization surveillance program. It is not intended to diagnose MRSA infection nor to guide or monitor treatment for MRSA infections.      Studies:   Recent x-ray studies have been reviewed in detail by the Attending Physician  Scheduled Meds:  Scheduled Meds: . collagenase   Topical Daily  . docusate sodium  100 mg Oral BID  . levothyroxine  37.5 mcg Oral QAC breakfast  . metoprolol tartrate  12.5 mg Oral BID  . OxyCODONE  10 mg Oral Q12H  . piperacillin-tazobactam (ZOSYN)  IV  3.375 g Intravenous 3 times per day  . Warfarin - Pharmacist Dosing Inpatient   Does not apply q1800    Time spent on care of this patient: 35 mins   [facility] T , MD   [facility] Office  [phone] Pager - Text Page per Amion as per below:  On-Call/Text  Page:      [email or web address]      password [email or web address]  If 7PM-7AM, please contact night-coverage [email or web address] Password [email or web address] 01/26/2015, 11:35 AM   LOS: 3 days

## 2015-01-26 NOTE — Progress Notes (Signed)
ANTICOAGULATION CONSULT NOTE - Follow Up Consult  Pharmacy Consult for Coumadin and Zosyn Indication: hx afib/DVT, PSA bacteremia  Allergies  Allergen Reactions  . Bactrim Ds [Sulfamethoxazole-Trimethoprim] Rash  . Tape Rash    Patient Measurements: Height:  (160 cm) Weight: 136 lb 12.8 oz (62.052 kg) IBW/kg (Calculated) : 52.4  Vital Signs: Temp: 98.8 F (37.1 C) (10/01 0800) Temp Source: Oral (10/01 0800) BP: 107/59 mmHg (10/01 0800) Pulse Rate: 76 (09/30 2354)  Labs:  Recent Labs  01/23/15 1246  01/24/15 0224 01/24/15 2210 01/25/15 0228 01/25/15 0834 01/26/15 0250  HGB 10.3*  --  9.3*  --  9.4*  --  9.5*  HCT 32.4*  --  29.5*  --  28.4*  --  29.2*  PLT 148*  --  125*  --  119*  --  118*  APTT 40*  --   --   --   --   --   --   LABPROT 29.8*  --  32.2*  --  30.4*  --  32.1*  INR 2.89*  --  3.20*  --  2.98*  --  3.20*  CREATININE 1.07*  --  1.15*  --  1.09*  --  1.04*  CKTOTAL  --   --   --   --   --   --  109  TROPONINI 0.11*  < >  --  0.46* 0.38* 0.30*  --   < > = values in this interval not displayed.  Estimated Creatinine Clearance: 33.9 mL/min (by C-G formula based on Cr of 1.04).  Assessment: 81 yo woman admitted 01/23/2015 with PSA bacteremia on warfarin for hx afib/DVT.   AC: Warfarin restarted yesterday evening after holding d/t supratherapeutic INR to 3.2 from 2.89. 9/30 INR back within range at 2.98. Pt therefore restarted on PTA regimen and given warfarin 3 mg x1.   INR this AM supratherapeutic again at 3.2. Supratherapeutic level on 9/29 thought to be d/t to sepsis/infectious etiology. However, pt seems to be clinically improving. No changes in AST/ALT, which are wnl. Albumin, Cr both stable. No changes in medications that would potentiate DDI and INR changes. CBC low but stable. No bleeding reported.   Home dose: 4.5mg  daily except  on Tues/Fri    ID: Day #4 abx tx, PSA bacteremia. WBC now wnl, afebrile. MRI LLE neg for abscess or  OM  9/28 vanc >9/30 9/28 Zosyn >  9/28 blood>> 2/2 PSA, pan sensitive 9/28 urine>> negative   Goal of Therapy:  INR 2-3 Monitor platelets by anticoagulation protocol: Yes    Plan:  Hold warfarin tonight d/t unknown etiology of INR fluctuation Daily INR, CBC Continue Zosyn 3.375 g IV q8h   Hillery Aldo, [place].D. PGY2 Cardiology Pharmacy Resident Pager: [phone]  01/26/2015,10:43 AM

## 2015-01-27 DIAGNOSIS — A4152 Sepsis due to Pseudomonas: Principal | ICD-10-CM

## 2015-01-27 DIAGNOSIS — S81802D Unspecified open wound, left lower leg, subsequent encounter: Secondary | ICD-10-CM

## 2015-01-27 DIAGNOSIS — L8990 Pressure ulcer of unspecified site, unspecified stage: Secondary | ICD-10-CM

## 2015-01-27 DIAGNOSIS — I481 Persistent atrial fibrillation: Secondary | ICD-10-CM

## 2015-01-27 DIAGNOSIS — I5033 Acute on chronic diastolic (congestive) heart failure: Secondary | ICD-10-CM

## 2015-01-27 DIAGNOSIS — I10 Essential (primary) hypertension: Secondary | ICD-10-CM

## 2015-01-27 DIAGNOSIS — I5022 Chronic systolic (congestive) heart failure: Secondary | ICD-10-CM

## 2015-01-27 LAB — CBC
HEMATOCRIT: 28.8 % — AB (ref 36.0–46.0)
HEMOGLOBIN: 9.3 g/dL — AB (ref 12.0–15.0)
MCH: 31.5 pg (ref 26.0–34.0)
MCHC: 32.3 g/dL (ref 30.0–36.0)
MCV: 97.6 fL (ref 78.0–100.0)
Platelets: 113 10*3/uL — ABNORMAL LOW (ref 150–400)
RBC: 2.95 MIL/uL — ABNORMAL LOW (ref 3.87–5.11)
RDW: 15.3 % (ref 11.5–15.5)
WBC: 4.7 10*3/uL (ref 4.0–10.5)

## 2015-01-27 LAB — BASIC METABOLIC PANEL
Anion gap: 7 (ref 5–15)
BUN: 14 mg/dL (ref 6–20)
CHLORIDE: 105 mmol/L (ref 101–111)
CO2: 26 mmol/L (ref 22–32)
CREATININE: 1.13 mg/dL — AB (ref 0.44–1.00)
Calcium: 8.1 mg/dL — ABNORMAL LOW (ref 8.9–10.3)
GFR calc non Af Amer: 44 mL/min — ABNORMAL LOW (ref >60)
GFR, EST AFRICAN AMERICAN: 51 mL/min — AB (ref >60)
Glucose, Bld: 81 mg/dL (ref 65–99)
Potassium: 3.5 mmol/L (ref 3.5–5.1)
Sodium: 138 mmol/L (ref 135–145)

## 2015-01-27 LAB — PROTIME-INR
INR: 3.56 — AB (ref 0.00–1.49)
Prothrombin Time: 34.8 seconds — ABNORMAL HIGH (ref 11.6–15.2)

## 2015-01-27 NOTE — Evaluation (Signed)
Physical Therapy Evaluation Patient Details Name: Kristina Diaz MRN: [id number] DOB: 1929/11/30 Today's Date: 01/27/2015   History of Present Illness  81 year old female with PMH of PAD, atrial fibrillation, LLE DVT, CHF, presents with LLE redness and ulceration. Patient lives at [facility] , and developed a fever of 102, tachycardia, tachypnea, and transferred to the ER today for further evaluation. Her mobility has been somewhat limited recently because of L3-L4 fracture and she has been taking OxyContin for her back pain. White count 12,000. INR 2.8  Clinical Impression  Pt admitted with above diagnosis. Pt currently with functional limitations due to the deficits listed below (see PT Problem List). Pt ambulation distance limited by chronic back pain as well as limited dorsiflexion on left. Pt mobility level currently near baseline, will follow acutely but so not expect she will need PT after d/c. Pt will benefit from skilled PT to increase their independence and safety with mobility to allow discharge to the venue listed below.       Follow Up Recommendations No PT follow up    Equipment Recommendations  None recommended by PT    Recommendations for Other Services       Precautions / Restrictions Precautions Precautions: Fall Restrictions Weight Bearing Restrictions: No      Mobility  Bed Mobility Overal bed mobility: Needs Assistance Bed Mobility: Supine to Sit     Supine to sit: Supervision     General bed mobility comments: pt able to get to EOB without physical assist, needs increased time. Has hospital bed at home  Transfers Overall transfer level: Needs assistance Equipment used: Rolling walker (2 wheeled) Transfers: Sit to/from Stand Sit to Stand: Min assist         General transfer comment: min A for safety, pt slow moving with mobility initiation  Ambulation/Gait Ambulation/Gait assistance: Min assist Ambulation Distance (Feet): 50 Feet Assistive  device: Rolling walker (2 wheeled) Gait Pattern/deviations: Step-through pattern;Trunk flexed;Decreased stance time - left Gait velocity: decreased Gait velocity interpretation: <1.8 ft/sec, indicative of risk for recurrent falls General Gait Details: pt unable to get left heel to floor, this has been the case past several months per pt. Asymmetrical gait also contributes to back pain.   Stairs            Wheelchair Mobility    Modified Rankin (Stroke Patients Only)       Balance Overall balance assessment: Needs assistance Sitting-balance support: Single extremity supported;Feet supported Sitting balance-Leahy Scale: Fair     Standing balance support: Bilateral upper extremity supported Standing balance-Leahy Scale: Poor                               Pertinent Vitals/Pain Pain Assessment: Faces Faces Pain Scale: Hurts a little bit Pain Location: back Pain Descriptors / Indicators: Aching Pain Intervention(s): Limited activity within patient's tolerance;Monitored during session    Home Living Family/patient expects to be discharged to:: Assisted living               Home Equipment: Walker - 2 wheels;Wheelchair - manual;Hospital bed Additional Comments: pt's husband pushes her in w/c at facility, uses RW within apt. Sustained lumbar compression fxs several years ago and has had chronic back pain since that time, especially with standing    Prior Function Level of Independence: Independent with assistive device(s)         Comments: lives in a 2 room apt with her husband at [facility]  [place]     Hand Dominance        Extremity/Trunk Assessment   Upper Extremity Assessment: Generalized weakness           Lower Extremity Assessment: Generalized weakness;LLE deficits/detail   LLE Deficits / Details: significant heel cord tightness, lacks full range of df  Cervical / Trunk Assessment: Kyphotic;Other exceptions  Communication    Communication: No difficulties  Cognition Arousal/Alertness: Awake/alert Behavior During Therapy: WFL for tasks assessed/performed Overall Cognitive Status: History of cognitive impairments - at baseline       Memory: Decreased short-term memory              General Comments      Exercises Other Exercises Other Exercises: left Achilles stretch with sheet x      Assessment/Plan    PT Assessment Patient needs continued PT services  PT Diagnosis Difficulty walking;Abnormality of gait;Generalized weakness;Acute pain   PT Problem List Decreased strength;Decreased range of motion;Decreased activity tolerance;Decreased balance;Decreased mobility;Decreased knowledge of precautions;Pain  PT Treatment Interventions DME instruction;Gait training;Functional mobility training;Therapeutic activities;Therapeutic exercise;Balance training;Patient/family education   PT Goals (Current goals can be found in the Care Plan section) Acute Rehab PT Goals Patient Stated Goal: return home PT Goal Formulation: With patient Time For Goal Achievement: 02/10/15 Potential to Achieve Goals: Good    Frequency Min 3X/week   Barriers to discharge        Co-evaluation               End of Session Equipment Utilized During Treatment: Gait belt Activity Tolerance: Patient tolerated treatment well Patient left: in chair;with call bell/phone within reach Nurse Communication: Mobility status         Time: 1610-9604 PT Time Calculation (min) (ACUTE ONLY): 18 min   Charges:   PT Evaluation $Initial PT Evaluation Tier I: 1 Procedure     PT G Codes:       Lyanne Co, PT  Acute Rehab Services  [phone]  [place], [place] 01/27/2015, 1:42 PM

## 2015-01-27 NOTE — Progress Notes (Signed)
PROGRESS NOTE  Kristina Diaz ZOX:[id number] DOB: 1929-09-28 DOA: 01/23/2015 PCP: [name], MD  Admit HPI / Brief Narrative: 81 year old F Hx PAD,Chronic Afib, Lt lower extremity DVT, and Chronic Systolic CHF on torsemide, who presented with left lower extremity redness and ulceration. Patient lives at [facility].  She developed a fever of 102, tachycardia, tachypnea, and was transferred to the ER for further evaluation. Patient is followed at the wound care clinic on a regular basis for her left lower extremity ulcer which she's had for 2 months.   In the ER the patient was febrile with a temperature to 103.1, tachycardic in A. fib with RVR with a pulse of 138. White count 12,000. INR 2.8  HPI/Subjective: Denies any fever or chills. In general feels much better, this morning she was frustrated because of food.  Assessment/Plan:  Sepsis Patient presented to the hospital with temperature of 103.1, heart rate of 132 and the presence of infection. Patient aggressively hydrated with IV fluids and started on broad-spectrum antibiotics. Sepsis is secondary to left lower extremity cellulitis with suppurative myositis and bacteremia.  Cellulitis with suppurative myositis Patient has recurrent left lower extremity cellulitis, currently has open ulcer, MRI showed evidence of myositis. No drainable abscess or fluid collection. Patient had previous surgery in her left lower extremity.  Pseudomonas bacteremia Blood culture 2 showed Pseudomonas aeruginosa, the source of infection is likely the left lower extremity cellulitis/myositis. Patient currently on Zosyn, she will probably can be discharged on oral antibiotics in the morning, we will double check with ID.  Chronic Systolic CHF + Chronic R heart failure  -TTE notes EF 45-50% 9/29 - no evidence of volume overload at this time - NSL today - wgt today 62.05kg - baseline wgt appears to be 58-60kg - net +4.6L at this time -  resume home diuretic and follow   Chronic Atrial fibrillation w/ acute RVR  -rate now well controlled but episodes of tachy w/ exertion - warfarin per Pharmacy - likely exacerbated by septic state - weaned back to oral rate control meds - follow HR w/ PT/OT evals  PAD Previously evaluated by Dr. Allyson Sabal - seen in his office 2014 at which time she reportedly had normal ABIs - ABIs this visit reportedly unable to be measured on L - exam not c/w acute arterial occlusion - consider outpt referral back to Dr. Allyson Sabal to consider definitive arterial study   Hx DVT L LE 2014 s/p thrombectomy  On anticoag   Hypertension -BP well controlled   Hypothyroidism -continue Synthroid 37.5 mg daily -TSH WNL   Code Status: FULL Family Communication: no family present at time of exam Disposition Plan: stable for transfer to tele bed - PT/OT - follow rate w/ exertion   Consultants: none  Procedures: 9/28 MRI left tib-fib without contrast - Abnormal asymmetric appearance of atrophy and low-level edema in posterior muscle groups in the left calf, the inflammation could possibly be from myositis - No abscess or osteomyelitis 9/29 TTE EF 45% to 50%. Regional wall motion abnormalities cannot be excluded 9/29 ABIs - R 1.11 - L absent   Antibiotics: Zosyn 9/28> Vancomycin 9/28>9/30  DVT prophylaxis: warfarin  Objective: Blood pressure 106/51, pulse 59, temperature 97.8 F (36.6 C), temperature source Oral, resp. rate 14, height  (1.6 m), weight 58.2 kg (128 lb 4.9 oz), SpO2 98 %.  Intake/Output Summary (Last 24 hours) at 01/27/15 1116 Last data filed at 01/27/15 0949  Gross per 24 hour  Intake  780 ml  Output   1200 ml  Net   -420 ml   Exam: General: No acute respiratory distress - alert  Lungs: Clear to auscultation bilaterally without wheezes / crackles Cardiovascular: Regular rate without murmur gallop or rub  Abdomen: Nontender, nondistended, soft, bowel sounds positive, no rebound,  no ascites, no appreciable mass Extremities: No significant cyanosis, clubbing, or edema bilateral lower extremities - L LE dressed and dry at present - toes B feet pink and warm   Data Reviewed: Basic Metabolic Panel:  Recent Labs Lab 01/23/15 1246 01/24/15 0224 01/25/15 0228 01/26/15 0250 01/27/15 0447  NA 140 137 140 139 138  K 3.7 3.9 3.8 3.5 3.5  CL 104 103 106 106 105  CO2 GLUCOSE 104* 111* 94 85 81  BUN 18 22* CREATININE 1.07* 1.15* 1.09* 1.04* 1.13*  CALCIUM 8.3* 8.0* 8.3* 8.2* 8.1*  MG 1.7  --  2.4  --   --     CBC:  Recent Labs Lab 01/23/15 1003 01/23/15 1246 01/24/15 0224 01/25/15 0228 01/26/15 0250 01/27/15 0447  WBC 12.0* 14.1* 13.7* 8.0 5.4 4.7  NEUTROABS 11.0* 12.8*  --  6.5  --   --   HGB 11.5* 10.3* 9.3* 9.4* 9.5* 9.3*  HCT 35.3* 32.4* 29.5* 28.4* 29.2* 28.8*  MCV 97.8 97.3 97.7 97.9 98.0 97.6  PLT 169 148* 125* 119* 118* 113*    Liver Function Tests:  Recent Labs Lab 01/23/15 1003 01/23/15 1246 01/24/15 0224 01/25/15 0228 01/26/15 0250  AST 33 39 33 41 33  ALT 13* ALKPHOS 77 80 65 63 58  BILITOT 1.6* 1.9* 1.1 0.7 0.5  PROT 8.0 6.8 5.9* 6.0* 6.1*  ALBUMIN 3.2* 2.7* 2.4* 2.4* 2.3*    Coags:  Recent Labs Lab 01/23/15 1246 01/24/15 0224 01/25/15 0228 01/26/15 0250 01/27/15 0447  INR 2.89* 3.20* 2.98* 3.20* 3.56*    Recent Labs Lab 01/23/15 1246  APTT 40*    Cardiac Enzymes:  Recent Labs Lab 01/23/15 1246 01/23/15 1808 01/24/15 2210 01/25/15 0228 01/25/15 0834 01/26/15 0250  CKTOTAL  --   --   --   --   --  109  TROPONINI 0.11* 0.25* 0.46* 0.38* 0.30*  --     Recent Results (from the past 240 hour(s))  Culture, blood (routine x 2)     Status: None   Collection Time: 01/23/15 10:00 AM  Result Value Ref Range Status   Specimen Description BLOOD RIGHT FOREARM  Final   Special Requests BOTTLES DRAWN AEROBIC AND ANAEROBIC 5CC  Final   Culture  Setup Time   Final    GRAM  NEGATIVE RODS AEROBIC BOTTLE ONLY CRITICAL RESULT CALLED TO, READ BACK BY AND VERIFIED WITH: M NORMENT  01/24/15 MKELLY    Culture PSEUDOMONAS AERUGINOSA  Final   Report Status 01/26/2015 FINAL  Final  Culture, blood (routine x 2)     Status: None   Collection Time: 01/23/15 10:03 AM  Result Value Ref Range Status   Specimen Description BLOOD LEFT FOREARM  Final   Special Requests BOTTLES DRAWN AEROBIC AND ANAEROBIC 5CC  Final   Culture  Setup Time   Final    GRAM NEGATIVE RODS AEROBIC BOTTLE ONLY CRITICAL RESULT CALLED TO, READ BACK BY AND VERIFIED WITH: M NORMNET @ 3762 01/24/15 MKELLY    Culture PSEUDOMONAS AERUGINOSA  Final   Report Status 01/26/2015 FINAL  Final   Organism ID,  Bacteria PSEUDOMONAS AERUGINOSA  Final      Susceptibility   Pseudomonas aeruginosa - MIC*    CEFTAZIDIME <=1 SENSITIVE Sensitive     CIPROFLOXACIN <=0.25 SENSITIVE Sensitive     GENTAMICIN <=1 SENSITIVE Sensitive     IMIPENEM 1 SENSITIVE Sensitive     PIP/TAZO 8 SENSITIVE Sensitive     CEFEPIME <=1 SENSITIVE Sensitive     * PSEUDOMONAS AERUGINOSA  Urine culture     Status: None   Collection Time: 01/23/15 10:14 AM  Result Value Ref Range Status   Specimen Description URINE, RANDOM  Final   Special Requests NONE  Final   Culture MULTIPLE SPECIES PRESENT, SUGGEST RECOLLECTION  Final   Report Status 01/24/2015 FINAL  Final  MRSA PCR Screening     Status: None   Collection Time: 01/23/15  5:22 PM  Result Value Ref Range Status   MRSA by PCR NEGATIVE NEGATIVE Final    Comment:        The GeneXpert MRSA Assay (FDA approved for NASAL specimens only), is one component of a comprehensive MRSA colonization surveillance program. It is not intended to diagnose MRSA infection nor to guide or monitor treatment for MRSA infections.      Studies:   Recent x-ray studies have been reviewed in detail by the Attending Physician  Scheduled Meds:  Scheduled Meds: . collagenase   Topical Daily  .  docusate sodium  100 mg Oral BID  . levothyroxine  37.5 mcg Oral QAC breakfast  . metoprolol tartrate  12.5 mg Oral BID  . multivitamin with minerals  1 tablet Oral q morning - 10a  . OxyCODONE  10 mg Oral Q12H  . piperacillin-tazobactam (ZOSYN)  IV  3.375 g Intravenous 3 times per day  . potassium chloride  10 mEq Oral BID  . torsemide  20 mg Oral Q breakfast  . [START ON 01/28/2015] torsemide  20 mg Oral Q M,W,F-1800  . Warfarin - Pharmacist Dosing Inpatient   Does not apply q1800    Time spent on care of this patient: 35 mins   Belinda Bringhurst A , MD   [facility] Office  [phone] Pager - Text Page per Amion as per below:  On-Call/Text Page:      [email or web address]      password [email or web address]  If 7PM-7AM, please contact night-coverage [email or web address] Password [email or web address] 01/27/2015, 11:16 AM   LOS: 4 days

## 2015-01-27 NOTE — Progress Notes (Signed)
ANTICOAGULATION CONSULT NOTE - Follow Up Consult  Pharmacy Consult for Coumadin Indication: hx afib/DVT  Allergies  Allergen Reactions  . Bactrim Ds [Sulfamethoxazole-Trimethoprim] Rash  . Tape Rash    Patient Measurements: Height:  (160 cm) Weight: 128 lb 4.9 oz (58.2 kg) IBW/kg (Calculated) : 52.4  Vital Signs: Temp: 97.8 F (36.6 C) (10/02 0516) Temp Source: Oral (10/02 0516) BP: 106/51 mmHg (10/02 0516) Pulse Rate: 59 (10/02 0516)  Labs:  Recent Labs  01/24/15 2210  01/25/15 0228 01/25/15 0834 01/26/15 0250 01/27/15 0447  HGB  --   < > 9.4*  --  9.5* 9.3*  HCT  --   --  28.4*  --  29.2* 28.8*  PLT  --   --  119*  --  118* 113*  LABPROT  --   --  30.4*  --  32.1* 34.8*  INR  --   --  2.98*  --  3.20* 3.56*  CREATININE  --   --  1.09*  --  1.04* 1.13*  CKTOTAL  --   --   --   --  109  --   TROPONINI 0.46*  --  0.38* 0.30*  --   --   < > = values in this interval not displayed.  Estimated Creatinine Clearance: 31.2 mL/min (by C-G formula based on Cr of 1.13).  Assessment: 81 yo woman admitted 01/23/2015 on warfarin for hx afib/DVT.   AC: INR continues to be supratherapeutic with slight increase today 3.56, even after warfarin was held yesterday. Supratherapeutic level on thought to be d/t to sepsis/infectious etiology. However, pt seems to be clinically improving. No changes in AST/ALT, which are wnl.  No bleeding reported. Hgb 9.3, plt low but stable, INR 3.56   Home dose: 4.5mg  daily except  on Tues/Fri  Goal of Therapy:  INR 2-3 Monitor platelets by anticoagulation protocol: Yes    Plan:  Hold warfarin tonight d/t unknown etiology of INR fluctuation Daily INR, CBC  Remi Haggard, PharmD Clinical Pharmacist- Resident Pager: [phone]  01/27/2015,8:26 AM

## 2015-01-28 DIAGNOSIS — L03116 Cellulitis of left lower limb: Secondary | ICD-10-CM

## 2015-01-28 DIAGNOSIS — I4891 Unspecified atrial fibrillation: Secondary | ICD-10-CM

## 2015-01-28 LAB — PROTIME-INR
INR: 2.94 — AB (ref 0.00–1.49)
Prothrombin Time: 30.1 seconds — ABNORMAL HIGH (ref 11.6–15.2)

## 2015-01-28 MED ORDER — CIPROFLOXACIN HCL 500 MG PO TABS: 500.0000 mg | ORAL_TABLET | Freq: Two times a day (BID) | ORAL | Status: DC

## 2015-01-28 MED ORDER — OXYCODONE HCL ER 10 MG PO T12A: 10.0000 mg | EXTENDED_RELEASE_TABLET | Freq: Two times a day (BID) | ORAL | Status: DC

## 2015-01-28 MED ORDER — WARFARIN SODIUM 1 MG PO TABS
1.0000 mg | ORAL_TABLET | Freq: Once | ORAL | Status: DC
  Filled 2015-01-28: qty 1

## 2015-01-28 NOTE — Clinical Social Work Note (Signed)
Patient has discharged back to [facility] today, transported by family. CSW transmitted d/c paperwork to facility and provided patient with medical packet for nursing staff.  Genelle Bal, MSW, LCSW Licensed Clinical Social Worker Clinical Social Work Department [employer] [phone]

## 2015-01-28 NOTE — Progress Notes (Signed)
Received discharge paperwork with all medication updates for facility from Social Worker Erie Noe, patient ready for discharge.

## 2015-01-28 NOTE — Progress Notes (Signed)
Pharmacist Provided - Patient Medication Education Prior to Discharge   Evelina Lore is an 81 y.o. female who presented to [facility] on 01/23/2015 with a chief complaint of  Chief Complaint  Patient presents with  . Fever  . Chills      Patient will be discharged with new medications  Patient being discharged without any new medications  The following medications were discussed with the patient:  Pain Control medications:  Yes     No  Diabetes Medications:  Yes     No  Heart Failure Medications:  Yes     No  Anticoagulation Medications:   Yes     No  Antibiotics at discharge:  Yes     No  Allergy Assessment Completed and Updated:  Yes     No Identified Patient Allergies:  Allergies  Allergen Reactions  . Bactrim Ds [Sulfamethoxazole-Trimethoprim] Rash  . Tape Rash     Medication Adherence Assessment:  Excellent (no doses missed/week)       Good (1 dose missed/week)       Partial (2-3 doses missed/week)       Poor (>3 doses missed/week)  Barriers to Obtaining Medications:  Yes  No   Assessment: 81 y/o F with pseudomonas bacteremia, improved inpt and will be discharged today with Cipro 500 mg po BID x 10 days. Counseled pt on side effects of antibiotics. Also counseled pt on per PTA warfarin for afib and she did not endorse any issues. Pt lives with her husband in assisted living and endorses excellent compliance to her medications.   Time spent preparing for discharge counseling: 15 min Time spent counseling patient: 15 min  [place] Pink, PharmD 01/28/2015, 12:55 PM

## 2015-01-28 NOTE — Discharge Summary (Addendum)
Physician Discharge Summary  Kristina Diaz ZOX:[id number] DOB: 05-04-1929 DOA: 01/23/2015  PCP: [name], MD  Admit date: 01/23/2015 Discharge date: 01/28/2015  Time spent: 40 minutes  Recommendations for Outpatient Follow-up:  1. Follow-up with primary care physician within one week. 2. Continue ciprofloxacin for 10 more days. 3. Home health RN to treat wounds.  Discharge Diagnoses:  Principal Problem:   Sepsis (HCC) Active Problems:   Atrial fibrillation (HCC)   Hypertension   Acute on chronic diastolic heart failure (HCC)   Hypothyroidism   Wound of lower extremity   Blood poisoning (HCC)   Atrial fibrillation with RVR (HCC)   Chronic systolic CHF (congestive heart failure) (HCC)   Cellulitis of left lower extremity   Pressure ulcer   Discharge Condition: Stable  Diet recommendation: Regular diet with no salt added   Filed Weights   01/26/15 0300 01/27/15 0516 01/28/15 0616  Weight: 62.052 kg (136 lb 12.8 oz) 58.2 kg (128 lb 4.9 oz) 64.2 kg (141 lb 8.6 oz)    History of present illness:  81 year old female with a history of peripheral arterial disease, atrial fibrillation, left lower extremity DVT, heart failure on torsemide, presents with left lower extremity redness and ulceration. Patient lives at [facility] , and developed a fever of 102, tachycardia, tachypnea, and transferred to the ER today for further evaluation. Patient is evaluated and followed at the wound care clinic on a regular basis for her left lower extremity ulcer which she's had for the last 2 months. She presents with worsening pain and left lower extremity erythema. Patient states that she is able to bear weight on her left lower extremity but her mobility has been somewhat limited because of L3-L4 fracture and she has been taking OxyContin for her back pain. In the ER the patient is febrile with a temperature to 103.1, tachycardic in A. fib with RVR with a pulse of 138. White count  12,000. INR 2.8  Hospital Course:   Sepsis Patient presented to the hospital with temperature of 103.1, heart rate of 132 and the presence of infection. Patient aggressively hydrated with IV fluids and started on broad-spectrum antibiotics. Sepsis is secondary to left lower extremity cellulitis with pyomyositis and bacteremia.  Cellulitis with possible pyomyositis Patient has recurrent left lower extremity cellulitis, currently has open ulcer, MRI showed evidence of myositis. No drainable abscess or fluid collection. Patient had previous surgery in her left lower extremity, probably resulted in disruption of lymphatics. Patient follows with the renal wound center for chronic wound. Discharge on Cipro for 10 more days.  Pseudomonas bacteremia Blood culture 2 showed Pseudomonas aeruginosa, the source of infection is likely the left lower extremity cellulitis/myositis. Patient currently on Zosyn, discharge on Cipro 500 mg for 10 more days.  Chronic Systolic CHF + Chronic R heart failure  -TTE notes EF 45-50% 9/29 - no evidence of volume overload at this time - NSL today - wgt today 62.05kg - baseline wgt appears to be 58-60kg - net +4.6L at this time - resume home diuretic and follow   Chronic Atrial fibrillation w/ acute RVR  -Rate now well controlled but episodes of tachy w/ exertion - warfarin per Pharmacy - likely exacerbated by septic state - weaned back to oral rate control meds - follow HR w/ PT/OT evals  PAD Previously evaluated by Dr. Allyson Sabal - seen in his office 2014 at which time she reportedly had normal ABIs - ABIs this visit reportedly unable to be measured on L - exam not  c/w acute arterial occlusion - consider outpt referral back to Dr. Allyson Sabal to consider definitive arterial study   Hx DVT L LE 2014 s/p thrombectomy  On anticoag   Hypertension -BP well controlled   Hypothyroidism -Continue Synthroid 37.5 mg daily -TSH  WNL   Procedures:  None  Consultations:  None  Discharge Exam: Filed Vitals:   01/28/15 0616  BP: 108/54  Pulse: 79  Temp: 98.1 F (36.7 C)  Resp: 18   General: Alert and awake, oriented x3, not in any acute distress. HEENT: anicteric sclera, pupils reactive to light and accommodation, EOMI CVS: S1-S2 clear, no murmur rubs or gallops Chest: clear to auscultation bilaterally, no wheezing, rales or rhonchi Abdomen: soft nontender, nondistended, normal bowel sounds, no organomegaly Extremities: no cyanosis, clubbing or edema noted bilaterally Neuro: Cranial nerves II-XII intact, no focal neurological deficits  Discharge Instructions   Discharge Instructions    Diet - low sodium heart healthy    Complete by:  As directed      Increase activity slowly    Complete by:  As directed           Current Discharge Medication List    START taking these medications   Details  ciprofloxacin (CIPRO) 500 MG tablet Take 1 tablet (500 mg total) by mouth 2 (two) times daily. Qty: 20 tablet, Refills: 0      CONTINUE these medications which have CHANGED   Details  OxyCODONE (OXYCONTIN) 10 mg T12A 12 hr tablet Take 1 tablet (10 mg total) by mouth every 12 (twelve) hours. Qty: 10 tablet, Refills: 0      CONTINUE these medications which have NOT CHANGED   Details  collagenase (SANTYL) ointment Apply 1 application topically every other day. To bilateral lower extremities wounds    docusate sodium (COLACE) 100 MG capsule Take 100 mg by mouth daily.    levothyroxine (SYNTHROID, LEVOTHROID) 25 MCG tablet TAKE 1 AND 1/2 TABS (37.5MG ) BY MOUTH EVERY DAY*CHECK PULSE WEEKLY* Qty: 45 tablet, Refills: 11    metoprolol succinate (TOPROL-XL) 25 MG 24 hr tablet Take 0.5 tablets (12.5 mg total) by mouth daily. Qty: 30 tablet, Refills: 11    Multiple Vitamin (MULTIVITAMIN WITH MINERALS) TABS Take 1 tablet by mouth every morning.    potassium chloride (MICRO-K) 10 MEQ CR capsule Take 10 mEq  by mouth 2 (two) times daily.    !! torsemide (DEMADEX) 20 MG tablet TAKE 1 TAB BY MOUTH EVERY DAY -- DX: EDEMA AND TAKE 1 TAB BY MOUTH AT 5PM ON MON WED FRI (THIS IS.IN ADDITION TO MORNING SCHEDULE DOSE) -... Qty: 42 tablet, Refills: 3    !! torsemide (DEMADEX) 20 MG tablet Take 20 mg by mouth every Monday, Wednesday, and Friday. At 5 pm in addition to daily torsemide     !! warfarin (COUMADIN) 2 MG tablet Take 2 mg by mouth See admin instructions. Take 2 mg by mouth with a 2.5 mg tablet on Mon, Wed, Thurs, Sat, Sun.    !! warfarin (COUMADIN) 2.5 MG tablet Take 2.5 mg by mouth See admin instructions. Take 2.5mg  tablet by mouth with a  tablet daily on Mon, Wed, Thurs, and Sat, Sun.    !! warfarin (COUMADIN) 3 MG tablet Take 3 mg by mouth See admin instructions. Take 1 tablet by mouth every Tuesday and Friday    nystatin (MYCOSTATIN) powder Apply topically 4 (four) times daily. Qty: 15 g, Refills: 11    potassium chloride (K-DUR,KLOR-CON) 10 MEQ tablet Take 1 tablet (  10 mEq total) by mouth 2 (two) times daily. Qty: 30 tablet, Refills: 1    !! warfarin (COUMADIN) 3 MG tablet TAKE 1 TAB (3MG ) BY MOUTH EVERY SUNDAY MON TUES THURS AND FRI AND TAKE 1 AND 1/2 TABS (4.5MG ) BY MOUTH EVERY WED AND SAT Qty: 16 tablet, Refills: 10     !! - Potential duplicate medications found. Please discuss with provider.     Allergies  Allergen Reactions  . Bactrim Ds [Sulfamethoxazole-Trimethoprim] Rash  . Tape Rash   Follow-up Information    Follow up with [name], MD In 1 week.   Specialty:  Internal Medicine   Contact information:   [address] [place] [address] [phone]        The results of significant diagnostics from this hospitalization (including imaging, microbiology, ancillary and laboratory) are listed below for reference.    Significant Diagnostic Studies: Dg Chest 2 View  01/23/2015   CLINICAL DATA:  Fevers and chills.  EXAM: CHEST  2 VIEW   COMPARISON:  12/15/2012  FINDINGS: There is no focal parenchymal opacity. There is no pleural effusion or pneumothorax. There is stable cardiomegaly.  The osseous structures are unremarkable.  IMPRESSION: No active cardiopulmonary disease.   Electronically Signed   By: Elige Ko   On: 01/23/2015 11:04   Mr Tibia Fibula Left Wo Contrast  01/23/2015   CLINICAL DATA:  Left lower extremity DVT. Heart failure. Lower extremity erythema and ulceration. Fever.  EXAM: MRI OF LOWER LEFT EXTREMITY WITHOUT CONTRAST  TECHNIQUE: Multiplanar, multisequence MR imaging of the left lower leg was performed. No intravenous contrast was administered.  COMPARISON:  None.  FINDINGS: Abnormal high T1 signal in the soleus muscle, flexor digitorum longus muscle, tibialis posterior muscle, and flexor hallucis muscle. Increased T1 signal in the peroneus brevis muscle. There is also abnormal increased T2 signal particularly in the lateral soleus, tibialis posterior, flexor digitorum longus, and extensor hallucis longus muscles. Abnormal fat density in the medial head gastrocnemius, image 1 series 6. The high T1 signal in these muscles does not have a masslike appearance but rather an infiltrative appearance favoring atrophy over hematoma. The increased T2 signal probably reflects inflammation or denervation edema.  There is some mild subcutaneous edema in both lower legs, left greater than right. No findings of osteomyelitis. No abscess.  IMPRESSION: 1. Abnormal asymmetric appearance of atrophy and low-level edema in posterior muscle groups in the left calf, and with slight involvement of the extensor hallucis longus. The appearance could most commonly be related to denervation atrophy with some low-level denervation edema. Given the lack of mass effect, and the relative atrophy of this size of muscles, I am not highly suspicious for hemorrhage. The inflammation could possibly be from myositis, correlate with fever/leukocytosis. 2.  Low-level but asymmetric to the left subcutaneous edema could be from cellulitis or venous stasis. 3. No abscess or osteomyelitis.   Electronically Signed   By: Gaylyn Rong M.D.   On: 01/23/2015 14:49    Microbiology: Recent Results (from the past 240 hour(s))  Culture, blood (routine x 2)     Status: None   Collection Time: 01/23/15 10:00 AM  Result Value Ref Range Status   Specimen Description BLOOD RIGHT FOREARM  Final   Special Requests BOTTLES DRAWN AEROBIC AND ANAEROBIC 5CC  Final   Culture  Setup Time   Final    GRAM NEGATIVE RODS AEROBIC BOTTLE ONLY CRITICAL RESULT CALLED TO, READ BACK BY AND VERIFIED WITH: M NORMENT @0552   01/24/15 MKELLY    Culture PSEUDOMONAS AERUGINOSA  Final   Report Status 01/26/2015 FINAL  Final  Culture, blood (routine x 2)     Status: None   Collection Time: 01/23/15 10:03 AM  Result Value Ref Range Status   Specimen Description BLOOD LEFT FOREARM  Final   Special Requests BOTTLES DRAWN AEROBIC AND ANAEROBIC 5CC  Final   Culture  Setup Time   Final    GRAM NEGATIVE RODS AEROBIC BOTTLE ONLY CRITICAL RESULT CALLED TO, READ BACK BY AND VERIFIED WITH: M NORMNET @ 8119 01/24/15 MKELLY    Culture PSEUDOMONAS AERUGINOSA  Final   Report Status 01/26/2015 FINAL  Final   Organism ID, Bacteria PSEUDOMONAS AERUGINOSA  Final      Susceptibility   Pseudomonas aeruginosa - MIC*    CEFTAZIDIME <=1 SENSITIVE Sensitive     CIPROFLOXACIN <=0.25 SENSITIVE Sensitive     GENTAMICIN <=1 SENSITIVE Sensitive     IMIPENEM 1 SENSITIVE Sensitive     PIP/TAZO 8 SENSITIVE Sensitive     CEFEPIME <=1 SENSITIVE Sensitive     * PSEUDOMONAS AERUGINOSA  Urine culture     Status: None   Collection Time: 01/23/15 10:14 AM  Result Value Ref Range Status   Specimen Description URINE, RANDOM  Final   Special Requests NONE  Final   Culture MULTIPLE SPECIES PRESENT, SUGGEST RECOLLECTION  Final   Report Status 01/24/2015 FINAL  Final  MRSA PCR Screening     Status: None    Collection Time: 01/23/15  5:22 PM  Result Value Ref Range Status   MRSA by PCR NEGATIVE NEGATIVE Final    Comment:        The GeneXpert MRSA Assay (FDA approved for NASAL specimens only), is one component of a comprehensive MRSA colonization surveillance program. It is not intended to diagnose MRSA infection nor to guide or monitor treatment for MRSA infections.      Labs: Basic Metabolic Panel:  Recent Labs Lab 01/23/15 1246 01/24/15 0224 01/25/15 0228 01/26/15 0250 01/27/15 0447  NA 140 137 140 139 138  K 3.7 3.9 3.8 3.5 3.5  CL 104 103 106 106 105  CO2 27 24 28 24 26   GLUCOSE 104* 111* 94 85 81  BUN 18 22* 16 15 14   CREATININE 1.07* 1.15* 1.09* 1.04* 1.13*  CALCIUM 8.3* 8.0* 8.3* 8.2* 8.1*  MG 1.7  --  2.4  --   --    Liver Function Tests:  Recent Labs Lab 01/23/15 1003 01/23/15 1246 01/24/15 0224 01/25/15 0228 01/26/15 0250  AST 33 39 33 41 33  ALT 13* 16 15 21 18   ALKPHOS 77 80 65 63 58  BILITOT 1.6* 1.9* 1.1 0.7 0.5  PROT 8.0 6.8 5.9* 6.0* 6.1*  ALBUMIN 3.2* 2.7* 2.4* 2.4* 2.3*   No results for input(s): LIPASE, AMYLASE in the last 168 hours. No results for input(s): AMMONIA in the last 168 hours. CBC:  Recent Labs Lab 01/23/15 1003 01/23/15 1246 01/24/15 0224 01/25/15 0228 01/26/15 0250 01/27/15 0447  WBC 12.0* 14.1* 13.7* 8.0 5.4 4.7  NEUTROABS 11.0* 12.8*  --  6.5  --   --   HGB 11.5* 10.3* 9.3* 9.4* 9.5* 9.3*  HCT 35.3* 32.4* 29.5* 28.4* 29.2* 28.8*  MCV 97.8 97.3 97.7 97.9 98.0 97.6  PLT 169 148* 125* 119* 118* 113*   Cardiac Enzymes:  Recent Labs Lab 01/23/15 1246 01/23/15 1808 01/24/15 2210 01/25/15 0228 01/25/15 0834 01/26/15 0250  CKTOTAL  --   --   --   --   --  109  TROPONINI 0.11* 0.25* 0.46* 0.38* 0.30*  --    BNP: BNP (last 3 results) No results for input(s): BNP in the last 8760 hours.  ProBNP (last 3 results) No results for input(s): PROBNP in the last 8760 hours.  CBG: No results for input(s): GLUCAP  in the last 168 hours.     Signed:  Melanie Pellot A  [facility] 01/28/2015, 11:17 AM

## 2015-01-28 NOTE — Progress Notes (Signed)
Patient discharge order put in, Social Worker Erie Noe called and aware.

## 2015-01-28 NOTE — Progress Notes (Signed)
NURSING PROGRESS NOTE  Kristina Diaz [id number] Discharge Data: 01/28/2015 4:25 PM Attending Provider: Clydia Llano, MD MVH:[id number],XLKGM Homero Fellers, MD   [name] to be D/C'd Skilled nursing facility per MD order via wheelchair by NA Vicky. Patient is being transported to facility by her son per her request and has denied EMS transport. All paperwork with hard copies of prescriptions  for facility given to son's wife whom verbalized she would give them to her facility.    All IV's will be discontinued and monitored for bleeding.  All belongings will be returned to patient for patient to take home.  Last Documented Vital Signs:  Blood pressure 100/59, pulse 91, temperature 98.4 F (36.9 C), temperature source Oral, resp. rate 20, height  (1.6 m), weight 64.[name]1 lb 8.6 oz), SpO2 100 %.  Leane Platt RN, BS, BSN

## 2015-01-28 NOTE — Care Management Important Message (Signed)
Important Message  Patient Details  Name: Kristina Diaz MRN: [id number] Date of Birth: July 22, 1929   Medicare Important Message Given:  Yes-second notification given    Lawerance Sabal, RN 01/28/2015, 11:13 AMImportant Message  Patient Details  Name: Kristina Diaz MRN: [id number] Date of Birth: 1929/06/25   Medicare Important Message Given:  Yes-second notification given    Lawerance Sabal, RN 01/28/2015, 11:13 AM

## 2015-01-28 NOTE — Care Management Note (Addendum)
Case Management Note  Patient Details  Name: Kristina Diaz MRN: [id number] Date of Birth: [birth year]  Subjective/Objective:                  Date-01-28-15 Monday Initial Assessment Spoke with patient at the bedside.  Introduced self as Sports coach and explained role in discharge planning and how to be reached.  Verified patient lives at [facility].  Verified patient anticipates to go ALF at time of discharge.  Patient has DME walker  wheelchair. Expressed potential need for no other DME.  Patient confirmed  needing help with their medication.  Patient is driven by activity bus at ALF to MD appointments.  Verified patient has PCP. Patient states they currently receive HH services through [place].     Plan: CM will continue to follow for discharge planning and [facility] resources.   Lawerance Sabal RN BSN CM [phone]   Action/Plan:  Referral made to [facility] for resumption of services.   Expected Discharge Date:  01/26/15               Expected Discharge Plan:  Assisted Living / Rest Home  In-House Referral:  Clinical Social Work  Discharge planning Services  CM Consult  Post Acute Care Choice:    Choice offered to:     DME Arranged:    DME Agency:     HH Arranged:    HH Agency:     Status of Service:  In process, will continue to follow  Medicare Important Message Given:  Yes-second notification given Date Medicare IM Given:    Medicare IM give by:    Date Additional Medicare IM Given:    Additional Medicare Important Message give by:     If discussed at Long Length of Stay Meetings, dates discussed:    Additional Comments:  Lawerance Sabal, RN 01/28/2015, 11:13 AM

## 2015-01-28 NOTE — Progress Notes (Signed)
ANTICOAGULATION CONSULT NOTE - Follow Up Consult  Pharmacy Consult for Coumadin Indication: hx afib/DVT  Allergies  Allergen Reactions  . Bactrim Ds [Sulfamethoxazole-Trimethoprim] Rash  . Tape Rash    Patient Measurements: Height:  (160 cm) Weight: 141 lb 8.6 oz (64.2 kg) IBW/kg (Calculated) : 52.4  Vital Signs: Temp: 98.1 F (36.7 C) (10/03 0616) Temp Source: Oral (10/03 0616) BP: 108/54 mmHg (10/03 0616) Pulse Rate: 79 (10/03 0616)  Labs:  Recent Labs  01/26/15 0250 01/27/15 0447 01/28/15 0730  HGB 9.5* 9.3*  --   HCT 29.2* 28.8*  --   PLT 118* 113*  --   LABPROT 32.1* 34.8* 30.1*  INR 3.20* 3.56* 2.94*  CREATININE 1.04* 1.13*  --   CKTOTAL 109  --   --     Estimated Creatinine Clearance: 34 mL/min (by C-G formula based on Cr of 1.13).  Assessment: 81 yo woman admitted 01/23/2015 on warfarin for hx afib/DVT.   Anticoagulation: warfarin for AFib/hx DVT, restarted PTA dose 9/30. 10/1 and 10/2 INR elevated dose held  10/2 INR 3.56- dose held 10/3 INR 2.94  PTA dose: 3 mg on Tues/Fri, 4.5 mg on all other days  Goal of Therapy:  INR 2-3 Monitor platelets by anticoagulation protocol: Yes    Plan:  Warfarin 1 mg x 1 tonight Daily INR Monitor for s/sx of bleeding  If patient discharges, would recommending sending patient home on lower dose of warfarin than on admission. Also would recommend close follow-up by Friday with flucuating INRs.   Isaac Bliss, PharmD, BCPS Clinical Pharmacist Pager [phone] 01/28/2015 11:35 AM

## 2015-01-28 NOTE — Evaluation (Signed)
Occupational Therapy Evaluation Patient Details Name: Kristina Diaz MRN: [id number] DOB: 07/09/1929 Today's Date: 01/28/2015    History of Present Illness   81 year old female with PMH of PAD, atrial fibrillation, LLE DVT, CHF, presents with LLE redness and ulceration. Patient lives at [facility] , and developed a fever of 102, tachycardia, tachypnea, and transferred to the ER today for further evaluation. Her mobility has been somewhat limited recently because of L3-L4 fracture and she has been taking OxyContin for her back pain. White count 12,000. INR 2.8   Clinical Impression   Pt is a 81 y/o female dx as above whom presents with deficits in her ability to perform ADL's and IADL's. She should benefit from acute OT to assist with increasing activity tolerance, and maximizing independence with ADL's/functional transfers (see OT problem list below for details). Pt appears close to baseline, will follow acutely for OT.    Follow Up Recommendations  No OT follow up    Equipment Recommendations  None recommended by OT (Pt reportedly has necessary DME per her report)    Recommendations for Other Services       Precautions / Restrictions Precautions Precautions: Fall Restrictions Weight Bearing Restrictions: No      Mobility Bed Mobility Overal bed mobility: Needs Assistance Bed Mobility: Supine to Sit     Supine to sit: Supervision     General bed mobility comments: pt able to get to EOB without physical assist, needs increased time. Has hospital bed at home  Transfers Overall transfer level: Needs assistance Equipment used: Rolling walker (2 wheeled) Transfers: Sit to/from Stand Sit to Stand: Min guard;Min assist         General transfer comment: min A for safety, pt slow moving with mobility initiation    Balance Overall balance assessment: Needs assistance Sitting-balance support: Single extremity supported;Feet supported Sitting balance-Leahy Scale:  Fair       Standing balance-Leahy Scale: Fair                              ADL Overall ADL's : Needs assistance/impaired     Grooming: Wash/dry hands;Oral care;Wash/dry face;Set up;Sitting;Cueing for sequencing   Upper Body Bathing: Set up;Sitting;Supervision/ safety   Lower Body Bathing: Set up;Minimal assistance;Sit to/from stand   Upper Body Dressing : Supervision/safety;Set up;Sitting   Lower Body Dressing: Supervision/safety;Minimal assistance;Sit to/from stand   Toilet Transfer: Min guard;BSC;RW;Ambulation (Simulated due to pt stating that she had recently used toilet)   Toileting- Architect and Hygiene: Min guard;Sit to/from stand   Retail buyer:  (NT as pt states that she sponge bathes since 2013 due to leg wound)   Functional mobility during ADLs: Min guard;Minimal assistance;Cueing for safety;Rolling walker General ADL Comments: Pt was educated in Role of OT and also participated in ADL retraining session this am with focus on increased independence with ADL's, activity tolerance and functional transfers. Pt appears to be close to baseline but should benefit from acute OT while in hospital to assist in maximizing independence.     Vision  Wears glasses at all times, no change from baseline   Perception     Praxis      Pertinent Vitals/Pain  Pt denies pain throughout OT assessment "None right now"     Hand Dominance Right   Extremity/Trunk Assessment             Communication     Cognition     Overall Cognitive Status:  History of cognitive impairments - at baseline                     General Comments       Exercises       Shoulder Instructions      Home Living Family/patient expects to be discharged to:: Assisted living   Available Help at Discharge: Available PRN/intermittently Type of Home: Assisted living Home Access: Level entry     Home Layout: One level     Bathroom Shower/Tub: Retail banker: Standard         Additional Comments:  (has LH reacher; sock aid; )  Lives With: Spouse    Prior Functioning/Environment          Comments: lives in a 2 room apt with her husband at [facility]    OT Diagnosis: Generalized weakness;Other (comment) (Chronic back pain)   OT Problem List: Decreased strength;Decreased activity tolerance;Impaired balance (sitting and/or standing);Decreased knowledge of use of DME or AE;Pain   OT Treatment/Interventions: Self-care/ADL training;DME and/or AE instruction;Patient/family education;Energy conservation;Therapeutic activities    OT Goals(Current goals can be found in the care plan section) Acute Rehab OT Goals Patient Stated Goal: return home Time For Goal Achievement: 02/11/15 Potential to Achieve Goals: Fair  OT Frequency: Min 2X/week   Barriers to D/C:            Co-evaluation              End of Session Equipment Utilized During Treatment: Rolling walker Nurse Communication: Mobility status  Activity Tolerance: Patient tolerated treatment well Patient left: in chair;with call bell/phone within reach;with nursing/sitter in room   Time: 0745-0824 OT Time Calculation (min): 39 min Charges:  OT General Charges $OT Visit: 1 Procedure OT Evaluation $Initial OT Evaluation Tier I: 1 Procedure OT Treatments $Self Care/Home Management : 8-22 mins G-Codes: OT G-codes **NOT FOR INPATIENT CLASS** Functional Assessment Tool Used: Clinical judgement Functional Limitation: Self care Self Care Current Status (Z6109): At least 20 percent but less than 40 percent impaired, limited or restricted Self Care Goal Status (U0454): At least 1 percent but less than 20 percent impaired, limited or restricted  Alm Bustard, OTR/L 01/28/2015, 8:36 AM

## 2015-01-28 NOTE — Progress Notes (Signed)
[facility]

## 2015-01-28 NOTE — Discharge Instructions (Signed)

## 2015-01-28 NOTE — Progress Notes (Signed)
Physical Therapy Treatment Patient Details Name: Kristina Diaz MRN: [id number] DOB: Oct 10, 1929 Today's Date: 01/28/2015    History of Present Illness 81 year old female with PMH of L3-4 fx which has limited mobility, PAD, atrial fibrillation, LLE DVT, CHF, presents with LLE redness and ulcerationand fever. Pt with sepsis due to cellulitis.     PT Comments    Pt making good progress.  Follow Up Recommendations  No PT follow up     Equipment Recommendations  None recommended by PT    Recommendations for Other Services       Precautions / Restrictions Precautions Precautions: Fall Restrictions Weight Bearing Restrictions: No    Mobility  Bed Mobility Overal bed mobility: Needs Assistance Bed Mobility: Supine to Sit     Supine to sit: Supervision     General bed mobility comments: pt able to get to EOB without physical assist, needs increased time. Has hospital bed at home  Transfers Overall transfer level: Needs assistance Equipment used: Rolling walker (2 wheeled) Transfers: Sit to/from Stand Sit to Stand: Min assist         General transfer comment: Assist to bring hips up from low recliner  Ambulation/Gait Ambulation/Gait assistance: Min guard Ambulation Distance (Feet): 50 Feet Assistive device: Rolling walker (2 wheeled) Gait Pattern/deviations: Step-through pattern;Decreased stride length;Decreased stance time - left;Trunk flexed Gait velocity: decreased Gait velocity interpretation: Below normal speed for age/gender General Gait Details: Unable to get lt heel to floor resulting in asymmetrical gait   Stairs            Wheelchair Mobility    Modified Rankin (Stroke Patients Only)       Balance Overall balance assessment: Needs assistance Sitting-balance support: No upper extremity supported;Feet supported Sitting balance-Leahy Scale: Good     Standing balance support: Bilateral upper extremity supported Standing balance-Leahy Scale:  Poor Standing balance comment: support of walker                    Cognition Arousal/Alertness: Awake/alert Behavior During Therapy: WFL for tasks assessed/performed Overall Cognitive Status: History of cognitive impairments - at baseline                      Exercises      General Comments        Pertinent Vitals/Pain      Home Living Family/patient expects to be discharged to:: Assisted living   Available Help at Discharge: Available PRN/intermittently Type of Home: Assisted living Home Access: Level entry   Home Layout: One level   Additional Comments:  (has LH reacher; sock aid; )    Prior Function        Comments: lives in a 2 room apt with her husband at [facility]   PT Goals (current goals can now be found in the care plan section) Acute Rehab PT Goals Patient Stated Goal: return home PT Goal Formulation: With patient Time For Goal Achievement: 02/10/15 Potential to Achieve Goals: Good Progress towards PT goals: Progressing toward goals    Frequency  Min 3X/week    PT Plan Current plan remains appropriate    Co-evaluation             End of Session   Activity Tolerance: Patient tolerated treatment well Patient left: in chair;with call bell/phone within reach     Time: 1022-1038 PT Time Calculation (min) (ACUTE ONLY): 16 min  Charges:  $Gait Training: 8-22 mins  G Codes:      Lanee Chain 01/28/2015, 11:06 AM  Skip Mayer PT [phone]

## 2015-01-29 ENCOUNTER — Encounter (HOSPITAL_BASED_OUTPATIENT_CLINIC_OR_DEPARTMENT_OTHER): Payer: Medicare Other | Attending: General Surgery

## 2015-01-29 DIAGNOSIS — I10 Essential (primary) hypertension: Secondary | ICD-10-CM | POA: Insufficient documentation

## 2015-01-29 DIAGNOSIS — Z86718 Personal history of other venous thrombosis and embolism: Secondary | ICD-10-CM | POA: Insufficient documentation

## 2015-01-29 DIAGNOSIS — I739 Peripheral vascular disease, unspecified: Secondary | ICD-10-CM | POA: Insufficient documentation

## 2015-01-29 DIAGNOSIS — I4891 Unspecified atrial fibrillation: Secondary | ICD-10-CM | POA: Insufficient documentation

## 2015-01-29 DIAGNOSIS — L97421 Non-pressure chronic ulcer of left heel and midfoot limited to breakdown of skin: Secondary | ICD-10-CM | POA: Insufficient documentation

## 2015-01-29 DIAGNOSIS — E039 Hypothyroidism, unspecified: Secondary | ICD-10-CM | POA: Insufficient documentation

## 2015-01-29 LAB — PROTIME-INR: INR: 2.6 — AB (ref <=1.1)

## 2015-01-30 ENCOUNTER — Telehealth: Payer: Self-pay | Admitting: *Deleted

## 2015-01-30 ENCOUNTER — Telehealth: Payer: Self-pay | Admitting: Internal Medicine

## 2015-01-30 MED ORDER — CEPHALEXIN 500 MG PO CAPS: 500.0000 mg | ORAL_CAPSULE | Freq: Three times a day (TID) | ORAL | Status: DC

## 2015-01-30 MED ORDER — FLUCONAZOLE 150 MG PO TABS: 150.0000 mg | ORAL_TABLET | Freq: Once | ORAL | Status: DC

## 2015-01-30 NOTE — Telephone Encounter (Signed)
Transition Care Management Follow-up Telephone Call  How have you been since you were released from the hospital? "Trying to get back to normal activities and strength" Feeling tired   Do you understand why you were in the hospital? YES  Do you understand the discharge instrcutions? YES  Items Reviewed:  Medications reviewed: YES  Allergies reviewed: YES  Dietary changes reviewed: YES   Referrals reviewed: YES   Functional Questionnaire:   Activities of Daily Living (ADLs):   She states they are independent in the following: wheelchair or walker and able to go to bathroom, do things at sink, make bed  States they require assistance with the following: nothing from before her admission    Any transportation issues/concerns?: YES; have to have a Thursday appointment because bus only transports on Thursdays    Any patient concerns? NO    Confirmed importance and date/time of follow-up visits scheduled: YES; unable to schedule appointment at this time due to limitation of bus route and provider schedule availability     Confirmed with patient if condition begins to worsen call PCP or go to the ER.  Patient was given the Call-a-Nurse line [phone]: YES

## 2015-01-30 NOTE — Telephone Encounter (Signed)
Discussed pt with Dr.Fry, verbal orders given to discontinue Cipro, start Keflex 500 mg TID x 7 days and Diflucan 150 mg tablet x 1.  Called [facility] spoke to [place], told her Dr.Fry is going to d/c Cipro and start on Keflex for 7 days and Diflucan 150 mg tablet x 1. Marylene Land verbalized understanding. Told her I will call [facility] and speak to pt's nurse and call Rx's into pharmacy. Marylene Land verbalized understanding.  Called [facility] and spoke to [place] pt's nurse and gave her verbal orders to discontinue Cipro, start Keflex 500 mg TID x 7 days and Diflucan 150 mg tablet x 1. Told her will call [facility] and call Rx's in for pt. Dee verbalized understanding. Asked her for [facility]'s number. Dee said [phone]. Told her okay, thanks.  Called [facility] pharmacy at [phone] and spoke to pharmacist [facility], verbal orders given for Keflex 500 mg TID x 7 days, disp # 21 and Diflucan 150 mg tablet x 1. Benny verbalized understanding.

## 2015-01-30 NOTE — Telephone Encounter (Signed)
Pt was d/c'd from hospital on Cipro. RN calling because she has a rash on left inner and outer thigh, red rash on abdomen and a possible yeast rash on her buttocks and perineum area.

## 2015-01-31 ENCOUNTER — Telehealth: Payer: Self-pay | Admitting: *Deleted

## 2015-01-31 NOTE — Telephone Encounter (Signed)
[facility] called stating they do not know how to dose the patient's Coumadin as she has just gotten out of the hospital. Patient is on new antibiotics (Keflex) and need to know (1) new dosage now that she is on antibiotics (2) how frequently to check PT/INR. Spoke with Kandee Keen and he advised to have patient take 3 mg (1 tablet) tonight until Dr. Collene Leyden can provide dosage until patient sees Arline Asp. Patient missed her Coumadin appointment on 10/4. Below is her most recent result. Patient last seen on 01/01/15 and therapeutic range was 2.0-3.0:   10/4: INR 2.64  PT 28.6

## 2015-02-01 ENCOUNTER — Ambulatory Visit (INDEPENDENT_AMBULATORY_CARE_PROVIDER_SITE_OTHER): Payer: Medicare Other | Admitting: Family

## 2015-02-01 DIAGNOSIS — I481 Persistent atrial fibrillation: Secondary | ICD-10-CM

## 2015-02-01 DIAGNOSIS — I4819 Other persistent atrial fibrillation: Secondary | ICD-10-CM

## 2015-02-01 DIAGNOSIS — Z5181 Encounter for therapeutic drug level monitoring: Secondary | ICD-10-CM

## 2015-02-01 LAB — POCT INR: INR: 2.6

## 2015-02-01 NOTE — Telephone Encounter (Signed)
Received a call from Marylene Land stating that she needs the new instructions for pt's coumadin from today on.  She was only given instructions for last night.  She also needs how often Dr. Kirtland Bouchard would like pt to be tested.  The only reason they are questioning the week re-check is because pt was on an antibiotic.

## 2015-02-01 NOTE — Telephone Encounter (Signed)
Marylene Land from [facility] called this morning regarding an order for this patient. Made Angela aware of MD response and she wanted to sure that the MD wanted to coumadin recheck in one week due her being on an antibiotics  Please fax over the order to ATTN: Marylene Land [phone]

## 2015-02-01 NOTE — Patient Instructions (Signed)
Continue to take 4.5 mg all days except 3 mg on Tuesdays/Fridays.   Dosing instructions faxed to [facility] @ [phone]. Re-check in 1 weeks.  Anticoagulation Dose Instructions as of 02/01/2015      Kristina Diaz Tue Wed Thu Fri Sat   New Dose 4.5 mg 4.5 mg 3 mg 4.5 mg 4.5 mg 3 mg 4.5 mg    Description        Continue to take 4.5 mg all days except 3 mg on Tuesdays/Fridays.   Dosing instructions faxed to [facility] @ [phone]. Re-check in 1 weeks.

## 2015-02-01 NOTE — Telephone Encounter (Signed)
Fax received from [facility]; pt dosed pt and gave instructions.  Form faxed back to [facility] and fax confirmation received.

## 2015-02-01 NOTE — Telephone Encounter (Signed)
Continue present dose of Coumadin Recheck PT/INR one week

## 2015-02-01 NOTE — Telephone Encounter (Signed)
Called [facility]'s main number [phone] to get information on who I needed to contact to report Dr. Charm Rings recommendations. Called and left a message for [facility] the Wellness Nurse to return call.

## 2015-02-05 ENCOUNTER — Ambulatory Visit (INDEPENDENT_AMBULATORY_CARE_PROVIDER_SITE_OTHER): Payer: Medicare Other | Admitting: General Practice

## 2015-02-05 DIAGNOSIS — I10 Essential (primary) hypertension: Secondary | ICD-10-CM | POA: Diagnosis not present

## 2015-02-05 DIAGNOSIS — Z86718 Personal history of other venous thrombosis and embolism: Secondary | ICD-10-CM | POA: Diagnosis not present

## 2015-02-05 DIAGNOSIS — Z5181 Encounter for therapeutic drug level monitoring: Secondary | ICD-10-CM

## 2015-02-05 DIAGNOSIS — I4891 Unspecified atrial fibrillation: Secondary | ICD-10-CM | POA: Diagnosis not present

## 2015-02-05 DIAGNOSIS — I739 Peripheral vascular disease, unspecified: Secondary | ICD-10-CM | POA: Diagnosis not present

## 2015-02-05 DIAGNOSIS — E039 Hypothyroidism, unspecified: Secondary | ICD-10-CM | POA: Diagnosis not present

## 2015-02-05 DIAGNOSIS — L97421 Non-pressure chronic ulcer of left heel and midfoot limited to breakdown of skin: Secondary | ICD-10-CM | POA: Diagnosis not present

## 2015-02-05 LAB — PROTIME-INR: INR: 3.5 — AB (ref <=1.1)

## 2015-02-05 NOTE — Progress Notes (Signed)
I have reviewed and agree with the plan. 

## 2015-02-05 NOTE — Progress Notes (Signed)
Pre visit review using our clinic review tool, if applicable. No additional management support is needed unless otherwise documented below in the visit note. 

## 2015-02-05 NOTE — Telephone Encounter (Signed)
Please ask Dr. Kirtland Bouchard who he would like patient to see for hospital follow-up since patient can only come in Thursdays due to transportation.

## 2015-02-06 NOTE — Telephone Encounter (Signed)
Appointment scheduled.

## 2015-02-13 ENCOUNTER — Ambulatory Visit (INDEPENDENT_AMBULATORY_CARE_PROVIDER_SITE_OTHER): Payer: Medicare Other | Admitting: General Practice

## 2015-02-13 DIAGNOSIS — Z5181 Encounter for therapeutic drug level monitoring: Secondary | ICD-10-CM

## 2015-02-13 NOTE — Progress Notes (Signed)
I have reviewed and agree with the plan. 

## 2015-02-13 NOTE — Progress Notes (Signed)
Pre visit review using our clinic review tool, if applicable. No additional management support is needed unless otherwise documented below in the visit note. 

## 2015-02-14 ENCOUNTER — Other Ambulatory Visit: Payer: Medicare Other

## 2015-02-14 ENCOUNTER — Ambulatory Visit (INDEPENDENT_AMBULATORY_CARE_PROVIDER_SITE_OTHER): Payer: Medicare Other | Admitting: Adult Health

## 2015-02-14 DIAGNOSIS — Z09 Encounter for follow-up examination after completed treatment for conditions other than malignant neoplasm: Secondary | ICD-10-CM | POA: Diagnosis not present

## 2015-02-14 DIAGNOSIS — A4152 Sepsis due to Pseudomonas: Secondary | ICD-10-CM

## 2015-02-14 DIAGNOSIS — I5022 Chronic systolic (congestive) heart failure: Secondary | ICD-10-CM | POA: Diagnosis not present

## 2015-02-14 LAB — CBC WITH DIFFERENTIAL/PLATELET
BASOS PCT: 0.5 % (ref 0.0–3.0)
Basophils Absolute: 0 10*3/uL (ref 0.0–0.1)
EOS PCT: 1.2 % (ref 0.0–5.0)
Eosinophils Absolute: 0.1 10*3/uL (ref 0.0–0.7)
HEMATOCRIT: 31.3 % — AB (ref 36.0–46.0)
HEMOGLOBIN: 10.1 g/dL — AB (ref 12.0–15.0)
LYMPHS PCT: 14.5 % (ref 12.0–46.0)
Lymphs Abs: 0.8 10*3/uL (ref 0.7–4.0)
MCHC: 32.2 g/dL (ref 30.0–36.0)
MCV: 95.9 fl (ref 78.0–100.0)
MONO ABS: 1.1 10*3/uL — AB (ref 0.1–1.0)
Neutro Abs: 3.8 10*3/uL (ref 1.4–7.7)
Neutrophils Relative %: 65.3 % (ref 43.0–77.0)
Platelets: 199 10*3/uL (ref 150.0–400.0)
RBC: 3.26 Mil/uL — AB (ref 3.87–5.11)
RDW: 15.9 % — AB (ref 11.5–15.5)
WBC: 5.8 10*3/uL (ref 4.0–10.5)

## 2015-02-14 LAB — COMPREHENSIVE METABOLIC PANEL
ALBUMIN: 3.3 g/dL — AB (ref 3.5–5.2)
ALT: 8 U/L (ref 0–35)
AST: 21 U/L (ref 0–37)
Alkaline Phosphatase: 76 U/L (ref 39–117)
BILIRUBIN TOTAL: 0.7 mg/dL (ref 0.2–1.2)
BUN: 22 mg/dL (ref 6–23)
CALCIUM: 9 mg/dL (ref 8.4–10.5)
CHLORIDE: 97 meq/L (ref 96–112)
CO2: 35 meq/L — AB (ref 19–32)
Creatinine, Ser: 1.02 mg/dL (ref 0.40–1.20)
GFR: 54.98 mL/min — AB (ref >60.00)
Glucose, Bld: 85 mg/dL (ref 70–99)
Potassium: 3.8 mEq/L (ref 3.5–5.1)
SODIUM: 141 meq/L (ref 135–145)
Total Protein: 7.6 g/dL (ref 6.0–8.3)

## 2015-02-14 NOTE — Progress Notes (Signed)
Subjective:    Patient ID: Kristina Diaz, female    DOB: 09/03/1929, 81 y.o.   MRN: [id number]  This patient presented during Epic downtime, limited information was available at time of exam.  HPI 81 year old pleasant Caucasian female, patient of Dr. Charm RingsK's, whom I'm seeing for a post hospital follow-up visit. He is originally seen in the emergency room on 01/22/2005 and admitted to the hospital for sepsis and had a discharge date of 01/27/2005. She spent 3 days in ICU care.her husband is present at this office visit  Her hospital note:  81 year old female with a history of peripheral arterial disease, atrial fibrillation, left lower extremity DVT, heart failure on torsemide, presents with left lower extremity redness and ulceration. [facility] , and developed a fever of 102, tachycardia, tachypnea, and transferred to the ER today for further evaluation. Patient is evaluated and followed at the wound care clinic on a regular basis for her left lower extremity ulcer which she's had for the last 2 months. She presents with worsening pain and left lower extremity erythema. Patient states that she is able to bear weight on her left lower extremity but her mobility has been somewhat limited because of L3-L4 fracture and she has been taking OxyContin for her back pain. In the ER the patient is febrile with a temperature to 103.1, tachycardic in A. fib with RVR with a pulse of 138. White count 12,000. INR 2.8  Was given broad-spectrum antibiotics vancomycin and Zosyn ,and hydrated with IV fluids.  Blood cultures showed gram-negative rods, cultured for Pseudomonas Aeruginosa  Started on Cipro prior to hospital discharge, this caused a rash after three days.  She was then started on Keflex, had no reaction to this antibiotic, and has since finished her antibiotic therapy on 02/08/2015.  She reports in the office today that she is feeling "much better" and "I'm almost back to my normal  self". He continues to complain of slight fatigue, but that this is improving every day. Next dose to the wound clinic on this upcoming Tuesday, and is having home health come to her facility to do dressing changes to the wounds on the left lower leg.   Hospital Course:   Sepsis Patient presented to the hospital with temperature of 103.1, heart rate of 132 and the presence of infection. Patient aggressively hydrated with IV fluids and started on broad-spectrum antibiotics. Sepsis is secondary to left lower extremity cellulitis with pyomyositis and bacteremia.  Cellulitis with possible pyomyositis Patient has recurrent left lower extremity cellulitis, currently has open ulcer, MRI showed evidence of myositis. No drainable abscess or fluid collection. Patient had previous surgery in her left lower extremity, probably resulted in disruption of lymphatics. Patient follows with the renal wound center for chronic wound. Discharge on Cipro for 10 more days.  Pseudomonas bacteremia Blood culture 2 showed Pseudomonas aeruginosa, the source of infection is likely the left lower extremity cellulitis/myositis. Patient currently on Zosyn, discharge on Cipro 500 mg for 10 more days.  Chronic Systolic CHF + Chronic R heart failure  -TTE notes EF 45-50% 9/29 - no evidence of volume overload at this time - NSL today - wgt today 62.05kg - baseline wgt appears to be 58-60kg - net +4.6L at this time - resume home diuretic and follow     Review of Systems  Constitutional: Positive for fatigue. Negative for fever, chills, diaphoresis, activity change and appetite change.  Respiratory: Negative for cough, choking, chest tightness, shortness of breath and  wheezing.   Cardiovascular: Positive for leg swelling. Negative for chest pain and palpitations.  Gastrointestinal: Negative.   Genitourinary: Negative.   Musculoskeletal: Positive for back pain, arthralgias and gait problem. Negative for joint swelling.    Neurological: Negative.   Hematological: Negative.   Psychiatric/Behavioral: Negative.        Objective:   Physical Exam  Constitutional: She is oriented to person, place, and time. She appears well-developed and well-nourished. No distress.  Thin and frail  Eyes: Right eye exhibits no discharge. Left eye exhibits no discharge.  Cardiovascular: Normal rate, regular rhythm, normal heart sounds and intact distal pulses.  Exam reveals no gallop and no friction rub.   No murmur heard. Pulmonary/Chest: Effort normal and breath sounds normal. No respiratory distress. She has no wheezes. She has no rales. She exhibits no tenderness.  Abdominal: Soft. Bowel sounds are normal.  Musculoskeletal: She exhibits edema and tenderness.  Tenderness to left leg. Her wounds are bandaged and patient would prefer not to have bandage taken off by anyone besides wound care. There is no drainage noted on the outside of bandage. No redness or warmth, no streaking to left leg.   She is in a wheelchair  Lymphadenopathy:    She has no cervical adenopathy.  Neurological: She is alert and oriented to person, place, and time.  Skin: Skin is warm and dry. No rash noted. She is not diaphoretic. No erythema. No pallor.  Psychiatric: She has a normal mood and affect. Her behavior is normal. Judgment and thought content normal.  Nursing note and vitals reviewed.      Assessment & Plan:  1. Hospital discharge follow-up -she appears to be much improved since her recent hospital stay. She complains of no signs or symptoms of infection and there was none noted during the exam. She can continue to monitor for infection and follow-up with PCP if any present itself - CBC with Differential/Platelet - Comprehensive metabolic panel  2. Sepsis due to Pseudomonas species (HCC) - appears to be cleared after antibiotic treatment - CBC with Differential/Platelet - Comprehensive metabolic panel  3. Chronic systolic CHF  (congestive heart failure) (HCC) -Seems to improved from August 2014. At that time her EF was 30 a 35%. Currently EF from 01/24/2015 shows ejection fraction of 45-50% - CBC with Differential/Platelet - Comprehensive metabolic panel

## 2015-02-19 ENCOUNTER — Ambulatory Visit (INDEPENDENT_AMBULATORY_CARE_PROVIDER_SITE_OTHER): Payer: Medicare Other | Admitting: General Practice

## 2015-02-19 DIAGNOSIS — E039 Hypothyroidism, unspecified: Secondary | ICD-10-CM | POA: Diagnosis not present

## 2015-02-19 DIAGNOSIS — Z86718 Personal history of other venous thrombosis and embolism: Secondary | ICD-10-CM | POA: Diagnosis not present

## 2015-02-19 DIAGNOSIS — L97421 Non-pressure chronic ulcer of left heel and midfoot limited to breakdown of skin: Secondary | ICD-10-CM | POA: Diagnosis not present

## 2015-02-19 DIAGNOSIS — Z5181 Encounter for therapeutic drug level monitoring: Secondary | ICD-10-CM

## 2015-02-19 DIAGNOSIS — I4891 Unspecified atrial fibrillation: Secondary | ICD-10-CM | POA: Diagnosis not present

## 2015-02-19 LAB — POCT INR: INR: 5.25

## 2015-02-19 NOTE — Progress Notes (Signed)
I have reviewed and agree with the plan. 

## 2015-02-19 NOTE — Progress Notes (Signed)
Pre visit review using our clinic review tool, if applicable. No additional management support is needed unless otherwise documented below in the visit note. 

## 2015-02-26 ENCOUNTER — Encounter (HOSPITAL_BASED_OUTPATIENT_CLINIC_OR_DEPARTMENT_OTHER): Payer: Medicare Other | Attending: General Surgery

## 2015-02-26 DIAGNOSIS — I739 Peripheral vascular disease, unspecified: Secondary | ICD-10-CM | POA: Insufficient documentation

## 2015-02-26 DIAGNOSIS — L97321 Non-pressure chronic ulcer of left ankle limited to breakdown of skin: Secondary | ICD-10-CM | POA: Diagnosis not present

## 2015-02-26 DIAGNOSIS — L89622 Pressure ulcer of left heel, stage 2: Secondary | ICD-10-CM | POA: Insufficient documentation

## 2015-02-26 DIAGNOSIS — Z86718 Personal history of other venous thrombosis and embolism: Secondary | ICD-10-CM | POA: Diagnosis not present

## 2015-02-26 DIAGNOSIS — I10 Essential (primary) hypertension: Secondary | ICD-10-CM | POA: Diagnosis not present

## 2015-02-26 DIAGNOSIS — L97821 Non-pressure chronic ulcer of other part of left lower leg limited to breakdown of skin: Secondary | ICD-10-CM | POA: Diagnosis present

## 2015-02-26 DIAGNOSIS — I87312 Chronic venous hypertension (idiopathic) with ulcer of left lower extremity: Secondary | ICD-10-CM | POA: Diagnosis not present

## 2015-02-28 ENCOUNTER — Other Ambulatory Visit: Payer: Self-pay | Admitting: *Deleted

## 2015-02-28 MED ORDER — OXYCODONE HCL ER 10 MG PO T12A: 10.0000 mg | EXTENDED_RELEASE_TABLET | Freq: Two times a day (BID) | ORAL | Status: DC

## 2015-02-28 NOTE — Telephone Encounter (Signed)
[facility]. Rx fax[facility] to [facility].

## 2015-03-04 ENCOUNTER — Telehealth: Payer: Self-pay | Admitting: Internal Medicine

## 2015-03-04 MED ORDER — OXYCODONE HCL ER 10 MG PO T12A: 10.0000 mg | EXTENDED_RELEASE_TABLET | Freq: Two times a day (BID) | ORAL | Status: DC

## 2015-03-04 NOTE — Telephone Encounter (Signed)
Pt has only 3 pill left. Pt need new rx oxycontin 10 mg fax to [facility] [phone]

## 2015-03-04 NOTE — Telephone Encounter (Signed)
Left message on Dee's voicemail New Rx faxed to [facility].

## 2015-03-05 ENCOUNTER — Ambulatory Visit (INDEPENDENT_AMBULATORY_CARE_PROVIDER_SITE_OTHER): Payer: Medicare Other | Admitting: General Practice

## 2015-03-05 DIAGNOSIS — L89622 Pressure ulcer of left heel, stage 2: Secondary | ICD-10-CM | POA: Diagnosis not present

## 2015-03-05 DIAGNOSIS — Z86718 Personal history of other venous thrombosis and embolism: Secondary | ICD-10-CM | POA: Diagnosis not present

## 2015-03-05 DIAGNOSIS — L97821 Non-pressure chronic ulcer of other part of left lower leg limited to breakdown of skin: Secondary | ICD-10-CM | POA: Diagnosis not present

## 2015-03-05 DIAGNOSIS — I87312 Chronic venous hypertension (idiopathic) with ulcer of left lower extremity: Secondary | ICD-10-CM | POA: Diagnosis not present

## 2015-03-05 DIAGNOSIS — Z5181 Encounter for therapeutic drug level monitoring: Secondary | ICD-10-CM

## 2015-03-05 LAB — PROTIME-INR: INR: 2.4 — AB (ref <=1.1)

## 2015-03-05 NOTE — Progress Notes (Signed)
Pre visit review using our clinic review tool, if applicable. No additional management support is needed unless otherwise documented below in the visit note. 

## 2015-03-05 NOTE — Progress Notes (Signed)
I have reviewed and agree with the plan. 

## 2015-03-06 ENCOUNTER — Other Ambulatory Visit: Payer: Self-pay

## 2015-03-06 DIAGNOSIS — I7092 Chronic total occlusion of artery of the extremities: Secondary | ICD-10-CM

## 2015-03-12 ENCOUNTER — Other Ambulatory Visit (HOSPITAL_BASED_OUTPATIENT_CLINIC_OR_DEPARTMENT_OTHER): Payer: Self-pay | Admitting: General Surgery

## 2015-03-12 DIAGNOSIS — Z86718 Personal history of other venous thrombosis and embolism: Secondary | ICD-10-CM | POA: Diagnosis not present

## 2015-03-12 DIAGNOSIS — I739 Peripheral vascular disease, unspecified: Secondary | ICD-10-CM

## 2015-03-12 DIAGNOSIS — L89622 Pressure ulcer of left heel, stage 2: Secondary | ICD-10-CM | POA: Diagnosis not present

## 2015-03-12 DIAGNOSIS — I87312 Chronic venous hypertension (idiopathic) with ulcer of left lower extremity: Secondary | ICD-10-CM | POA: Diagnosis not present

## 2015-03-12 DIAGNOSIS — L97821 Non-pressure chronic ulcer of other part of left lower leg limited to breakdown of skin: Secondary | ICD-10-CM | POA: Diagnosis not present

## 2015-03-26 ENCOUNTER — Ambulatory Visit (INDEPENDENT_AMBULATORY_CARE_PROVIDER_SITE_OTHER): Payer: Medicare Other | Admitting: General Practice

## 2015-03-26 DIAGNOSIS — L89622 Pressure ulcer of left heel, stage 2: Secondary | ICD-10-CM | POA: Diagnosis not present

## 2015-03-26 DIAGNOSIS — I87312 Chronic venous hypertension (idiopathic) with ulcer of left lower extremity: Secondary | ICD-10-CM | POA: Diagnosis not present

## 2015-03-26 DIAGNOSIS — Z86718 Personal history of other venous thrombosis and embolism: Secondary | ICD-10-CM | POA: Diagnosis not present

## 2015-03-26 DIAGNOSIS — Z5181 Encounter for therapeutic drug level monitoring: Secondary | ICD-10-CM

## 2015-03-26 DIAGNOSIS — L97821 Non-pressure chronic ulcer of other part of left lower leg limited to breakdown of skin: Secondary | ICD-10-CM | POA: Diagnosis not present

## 2015-03-26 LAB — PROTIME-INR: INR: 3 — AB (ref <=1.1)

## 2015-03-26 NOTE — Progress Notes (Signed)
I have reviewed and agree with the plan. 

## 2015-03-26 NOTE — Progress Notes (Signed)
Pre visit review using our clinic review tool, if applicable. No additional management support is needed unless otherwise documented below in the visit note. 

## 2015-04-02 ENCOUNTER — Other Ambulatory Visit: Payer: Self-pay | Admitting: Interventional Radiology

## 2015-04-02 ENCOUNTER — Ambulatory Visit
Admission: RE | Admit: 2015-04-02 | Discharge: 2015-04-02 | Disposition: A | Discharge status: Home / Self Care | Payer: Medicare Other | Source: Ambulatory Visit | Attending: Interventional Radiology | Admitting: Interventional Radiology

## 2015-04-02 ENCOUNTER — Ambulatory Visit
Admission: RE | Admit: 2015-04-02 | Discharge: 2015-04-02 | Disposition: A | Discharge status: Home / Self Care | Payer: Medicare Other | Source: Ambulatory Visit | Attending: General Surgery | Admitting: General Surgery

## 2015-04-02 ENCOUNTER — Other Ambulatory Visit: Payer: Self-pay | Admitting: Radiology

## 2015-04-02 DIAGNOSIS — I739 Peripheral vascular disease, unspecified: Secondary | ICD-10-CM

## 2015-04-02 DIAGNOSIS — I70229 Atherosclerosis of native arteries of extremities with rest pain, unspecified extremity: Secondary | ICD-10-CM

## 2015-04-02 DIAGNOSIS — I998 Other disorder of circulatory system: Secondary | ICD-10-CM

## 2015-04-02 NOTE — Consult Note (Signed)
Chief Complaint: I have a leg wound.   Referring Physician(s): Dr. Joanne Gavel  History of Present Illness: Kristina Diaz is a 81 y.o. female referred to vascular and interventional radiology clinic today for evaluation of a left lower extremity wound and potential candidacy for revascularization. She has been kindly referred by Dr. Joanne Gavel of the [facility] hyperbaric and wound center.  Kristina Diaz reports that she has been seeing Dr. Wiliam Ke routinely for about 2 years now, starting in 2014. She was originally referred by her primary care physician when she relocated to the [place] area from [place]. At this time, she had a left posterior heel wound, which she reports did heal with excellent wound care. In addition, she reports having an interval right-sided foot wound, which healed very well and rapidly with excellent wound care. Recently, she has been admitted to [facility], discharged home 01/28/2015, with infection of a separate chronic wound of the posterior calf on the left.    Kristina Diaz reports that when she lived in [place], she required a thrombectomy of her left lower extremity, though it is unclear if this was arterial thrombectomy or venous thrombectomy. She reports to me that she was told at that time that she had occlusions of the arteries of her left lower extremity. She does not ambulate, and since she has relocated to [facility] has been using a wheelchair for her mobility. She does report left lower extremity pain, which is a throbbing quality. She denies any night pain that wakes her up.  She denies any history of hypertension, hyperlipidemia, stroke, myocardial infarction, smoking, diabetes mellitus. Thus, she has no significant risk factors for PAD except for age. It is possible that her left-sided arterial disease was initiated with the sequela of embolic disease given her arrhythmia.  Noninvasive arterial study performed while she was an  inpatient, on the date 01/24/2015 shows significantly abnormal quality of the waveform of the left DP and PT, suggesting significant arterial compromise. Ankle brachial index could not be computed on the left. On the right, ABI measures 1.11.  She is prescribed Coumadin currently for arrhythmia.     Past Medical History  Diagnosis Date  . PAD (peripheral artery disease) 2013    left leg. sounds like she had angio-plasty   . Atrial fibrillation 2013    s/p [facility] that failed. ON medication  . Hypertension   . Osteoporosis   . DVT (deep venous thrombosis) 2013    "LLE" (08/04/2012)  . Hypothyroidism   . History of blood transfusion 2013    "related to thrombectomy" (08/04/2012)  . Anemia     "slightly" (08/04/2012)  . Arthritis     "probably minor; knees, pinky" (08/04/2012)  . Fracture of L4 vertebra     "dx'd in 2013; can't treat til legs fixed" (08/04/2012)  . Lower extremity ulceration   . Biventricular heart failure with reduced left ventricular function     Past Surgical History  Procedure Laterality Date  . Thrombectomy Left 2013    "leg" (08/04/2012)  . G2p2    . Tonsillectomy and adenoidectomy  ~ 1938  . Dilation and curettage of uterus  1960's    Allergies: Bactrim ds; Ciprofloxacin; and Tape  Medications: Prior to Admission medications   Medication Sig Start Date End Date Taking? Authorizing Provider  collagenase (SANTYL) ointment Apply 1 application topically every other day. To bilateral lower extremities wounds   Yes Historical Provider, MD  docusate sodium (COLACE) 100 MG  capsule Take 100 mg by mouth daily.   Yes Historical Provider, MD  levothyroxine (SYNTHROID, LEVOTHROID) 25 MCG tablet TAKE 1 AND 1/2 TABS (37.5MG ) BY MOUTH EVERY DAY*CHECK PULSE WEEKLY* 07/14/12  Yes Jacques Navy, MD  metoprolol succinate (TOPROL-XL) 25 MG 24 hr tablet Take 0.5 tablets (12.5 mg total) by mouth daily. 11/01/12  Yes Jacques Navy, MD  Multiple Vitamin (MULTIVITAMIN WITH  MINERALS) TABS Take 1 tablet by mouth every morning.   Yes Historical Provider, MD  oxyCODONE (OXYCONTIN) 10 mg 12 hr tablet Take 1 tablet (10 mg total) by mouth every 12 (twelve) hours. 03/04/15  Yes Kristian Covey, MD  potassium chloride (K-DUR,KLOR-CON) 10 MEQ tablet Take 1 tablet (10 mEq total) by mouth 2 (two) times daily. 08/11/12  Yes Hadassah Pais, PA-C  silver sulfADIAZINE (SILVADENE) 1 % cream Apply 1 application topically 2 (two) times daily. Apply to wounds 1-2 times daily as directed.   Yes Historical Provider, MD  warfarin (COUMADIN) 2 MG tablet Take 2 mg by mouth See admin instructions. Take 2 mg by mouth with a 2.5 mg tablet on Mon, Wed, Thurs, Sat, Sun.   Yes Historical Provider, MD  warfarin (COUMADIN) 3 MG tablet TAKE 1 TAB ( ) BY MOUTH EVERY SUNDAY MON TUES THURS AND FRI AND TAKE 1 AND 1/2 TABS (4.5MG ) BY MOUTH EVERY WED AND SAT 11/21/12  Yes Jacques Navy, MD  cephALEXin (KEFLEX) 500 MG capsule Take 1 capsule (500 mg total) by mouth 3 (three) times daily. Patient not taking: Reported on 04/02/2015 01/30/15   Nelwyn Salisbury, MD  ciprofloxacin (CIPRO) 500 MG tablet Take 1 tablet (500 mg total) by mouth 2 (two) times daily. Patient not taking: Reported on 04/02/2015 01/28/15   Clydia Llano, MD  fluconazole (DIFLUCAN) 150 MG tablet Take 1 tablet (150 mg total) by mouth once. Patient not taking: Reported on 04/02/2015 01/30/15   Nelwyn Salisbury, MD  nystatin (MYCOSTATIN) powder Apply topically 4 (four) times daily. 07/08/12   Jacques Navy, MD  potassium chloride (MICRO-K) 10 MEQ CR capsule Take 10 mEq by mouth 2 (two) times daily.    Historical Provider, MD  torsemide (DEMADEX) 20 MG tablet TAKE 1 TAB BY MOUTH EVERY DAY -- DX: EDEMA AND TAKE 1 TAB BY MOUTH AT 5PM ON MON WED FRI (THIS IS.IN ADDITION TO MORNING SCHEDULE DOSE) -... Patient taking differently: Take 1 tablet by mouth every morning at 8 am in addition to toresemide  three times weekly 04/24/14   Laurey Morale, MD    torsemide [facility]) 20 MG tablet Take 20 mg by mouth every Monday, Wednesday, and Friday. At 5 pm in addition to daily torsemide     Historical Provider, MD  warfarin (COUMADIN) 2.5 MG tablet Take 2.5 mg by mouth See admin instructions. Take 2.5mg  tablet by mouth with a  tablet daily on Mon, Wed, Thurs, and Sat, Sun.    Historical Provider, MD  warfarin (COUMADIN) 3 MG tablet Take 3 mg by mouth See admin instructions. Take 1 tablet by mouth every Tuesday and Friday    Historical Provider, MD     Family History  Problem Relation Age of Onset  . Arthritis Mother   . Cancer Mother     breast cancer   . Cancer Brother     lung cancer  . Tuberculosis Father   . Cancer Sister     stg 4 breast cancer  . Diabetes Neg Hx   . Heart disease Neg  Hx   . COPD Neg Hx     Social History   Social History  . Marital Status: Married    Spouse Name: N/A  . Number of Children: 2  . Years of Education: 13   Occupational History  . retired    Social History Main Topics  . Smoking status: Never Smoker   . Smokeless tobacco: Never Used  . Alcohol Use: 0.0 oz/week     Comment: 08/04/2012 "used to have 1-2 glasses of wine/wk; nothing for the past 1 yr"  . Drug Use: No  . Sexual Activity: No   Other Topics Concern  . Not on file   Social History Narrative   HSG. 1 year of college. Married: 1959: 2 sons - '63, '70: 3 grandchildren. Work - Animal nutritionist. Have been living with husband in [place]. ACP/Living will - HCPOA both sons. Recommended going to [email or web address].                       Review of Systems: A 12 point ROS discussed and pertinent positives are indicated in the HPI above.  All other systems are negative.  Review of Systems  Vital Signs: BP 125/57 mmHg  Pulse 93  Temp(Src) 98.2 F (36.8 C) (Oral)  Resp 15  Ht  (1.651 m)  Wt 124 lb (56.246 kg)  BMI 20.63 kg/m2  SpO2 98%  Physical Exam   Atraumatic normocephalic mucous membranes  moist pink. Wearing glasses. Conjugate gaze. No scleral icterus or scleral injection. No adenopathy of the neck. Alert and oriented to person place and time. Good insight and insightful questions. Moving upper extremities equally, with gross motor and sensory intact. Moving both the right and left lower extremity limited given her immobility in the wheelchair. Clear to auscultation bilateral lungs. No wheezes rhonchi or rails. No third heart sounds appreciated. Abdomen soft nontender. Genitourinary deferred. Examination of the left lower extremity demonstrates 2 separate full-thickness wounds:  1 of the posterior distal calf just above the calcaneal insertion of the Achilles measuring approximately the size of a half-dollar, and the second of the lateral posterior calf which extends from the mid calf distally to the level of the malleolus. She has thickening of her nails and flaking/drying of the skin.   Mallampati Score:  2  Imaging: No results found.  Labs:  CBC:  Recent Labs  01/25/15 0228 01/26/15 0250 01/27/15 0447 02/14/15 1331  WBC 8.0 5.4 4.7 5.8  HGB 9.4* 9.5* 9.3* 10.1*  HCT 28.4* 29.2* 28.8* 31.3*  PLT 119* 118* 113* 199.0    COAGS:  Recent Labs  01/23/15 1246  02/05/15 02/19/15 03/05/15 03/26/15  INR 2.89*  < > 3.5* 5.25 2.4* 3.0*  APTT 40*  --   --   --   --   --   < > = values in this interval not displayed.  BMP:  Recent Labs  01/24/15 0224 01/25/15 0228 01/26/15 0250 01/27/15 0447 02/14/15 1331  NA 137 140 139 138 141  K 3.9 3.8 3.5 3.5 3.8  CL 103 106 106 105 97  CO2 35*  GLUCOSE 111* 94 85 81 85  BUN 22* CALCIUM 8.0* 8.3* 8.2* 8.1* 9.0  CREATININE 1.15* 1.09* 1.04* 1.13* 1.02  GFRNONAA 43* 46* 48* 44*  --   GFRAA 50* 53* 56* 51*  --     LIVER FUNCTION TESTS:  Recent Labs  01/24/15 0224 01/25/15 0228  01/26/15 0250 02/14/15 1331  BILITOT 1.1 0.7 0.5 0.7  AST 33 41 33 21  ALT 15 21 18 8   ALKPHOS 65  63 58 76  PROT 5.9* 6.0* 6.1* 7.6  ALBUMIN 2.4* 2.4* 2.3* 3.3*    TUMOR MARKERS: No results for input(s): AFPTM, CEA, CA199, CHROMGRNA in the last 8760 hours.  Assessment and Plan:  Ms Marisa SprinklesKraft is an 81 year old woman presenting with right lower extremity critical limb ischemia with extensive wound of the calf, Rutherford 6 category symptoms. She has evidence on physical exam and on noninvasive arterial exam of significant arterial occlusive disease.  I discussed with her at length the anatomy, physiology, and the pathophysiology of her symptoms of critical limb ischemia, as well as options for treatment. I had a lengthy discussion with her regarding endovascular option of revascularization to improve blood flow with the goal of tissue salvage. In addition I did discuss with her the risk benefit analysis, with specific risks of our procedure including infection, bleeding, arterial injury/dissection, need for further procedure, surgery, kidney injury, contrast reaction, cardiopulmonary collapse, limb loss/tissue loss, death.  I also discussed with her the need to discontinue her Coumadin/anticoagulation therapy before her procedure. For her, we would either bridge with Lovenox for a target INR of 1.4, or she could be removed completely from her blood thinners for a target INR of 1.4 or below. We can contact her prescribing physician for their impression.  I also discussed with her the benefits of anatomic arterial mapping with CT angiogram.   This can be performed as soon as possible.  Plan: Abdominal and pelvic CTA with runoff  Plan on future abdominal peripheral angiogram and possible intervention for revascularization of the left lower extremity after CTA performed.  We will need to speak with prescribing physician's impression on Lovenox bridge or Coumadin withdrawal for a target INR of 1.4 before the procedure.  I discussed the plan both with the patient with Dr. Wiliam KeArkin, who are in  agreement. I answered all of her questions to the best of my ability   Thank you for this interesting consult.  I greatly enjoyed meeting Rigoberto NoelNancy Lopp and look forward to participating in their care.  A copy of this report was sent to the requesting provider on this date.  SignedGilmer Mor: Kalena Mander 04/02/2015, 3:46 PM   I spent a total of 40 Minutes  in face to face in clinical consultation, greater than 50% of which was counseling/coordinating care for critical limb ischemia, left lower extremity Rutherford 6 disease, possible angioplasty and revascularization.

## 2015-04-03 ENCOUNTER — Other Ambulatory Visit: Payer: Self-pay | Admitting: *Deleted

## 2015-04-03 MED ORDER — OXYCODONE HCL ER 10 MG PO T12A: 10.0000 mg | EXTENDED_RELEASE_TABLET | Freq: Two times a day (BID) | ORAL | Status: DC

## 2015-04-03 NOTE — Telephone Encounter (Signed)
Received request for refill on Oxycontin. Rx printed and signed and fax[facility].

## 2015-04-04 ENCOUNTER — Other Ambulatory Visit: Payer: Medicare Other

## 2015-04-09 ENCOUNTER — Other Ambulatory Visit: Payer: Self-pay | Admitting: Medical Oncology

## 2015-04-09 ENCOUNTER — Encounter (HOSPITAL_BASED_OUTPATIENT_CLINIC_OR_DEPARTMENT_OTHER): Payer: Medicare Other | Attending: General Surgery

## 2015-04-09 DIAGNOSIS — I739 Peripheral vascular disease, unspecified: Secondary | ICD-10-CM | POA: Diagnosis not present

## 2015-04-09 DIAGNOSIS — I499 Cardiac arrhythmia, unspecified: Secondary | ICD-10-CM | POA: Insufficient documentation

## 2015-04-09 DIAGNOSIS — L97322 Non-pressure chronic ulcer of left ankle with fat layer exposed: Secondary | ICD-10-CM | POA: Insufficient documentation

## 2015-04-09 DIAGNOSIS — Z86718 Personal history of other venous thrombosis and embolism: Secondary | ICD-10-CM | POA: Diagnosis not present

## 2015-04-09 DIAGNOSIS — L97822 Non-pressure chronic ulcer of other part of left lower leg with fat layer exposed: Secondary | ICD-10-CM | POA: Diagnosis not present

## 2015-04-09 DIAGNOSIS — I10 Essential (primary) hypertension: Secondary | ICD-10-CM | POA: Diagnosis not present

## 2015-04-09 DIAGNOSIS — L89622 Pressure ulcer of left heel, stage 2: Secondary | ICD-10-CM | POA: Diagnosis present

## 2015-04-23 ENCOUNTER — Ambulatory Visit (INDEPENDENT_AMBULATORY_CARE_PROVIDER_SITE_OTHER): Payer: Medicare Other | Admitting: General Practice

## 2015-04-23 DIAGNOSIS — L97322 Non-pressure chronic ulcer of left ankle with fat layer exposed: Secondary | ICD-10-CM | POA: Diagnosis not present

## 2015-04-23 DIAGNOSIS — L89622 Pressure ulcer of left heel, stage 2: Secondary | ICD-10-CM | POA: Diagnosis not present

## 2015-04-23 DIAGNOSIS — Z86718 Personal history of other venous thrombosis and embolism: Secondary | ICD-10-CM | POA: Diagnosis not present

## 2015-04-23 DIAGNOSIS — L97822 Non-pressure chronic ulcer of other part of left lower leg with fat layer exposed: Secondary | ICD-10-CM | POA: Diagnosis not present

## 2015-04-23 LAB — PROTIME-INR: INR: 2.8 — AB (ref <=1.1)

## 2015-04-23 NOTE — Progress Notes (Signed)
Pre visit review using our clinic review tool, if applicable. No additional management support is needed unless otherwise documented below in the visit note. 

## 2015-04-25 ENCOUNTER — Ambulatory Visit (HOSPITAL_COMMUNITY)
Admission: RE | Admit: 2015-04-25 | Discharge: 2015-04-25 | Disposition: A | Discharge status: Home / Self Care | Payer: Medicare Other | Source: Ambulatory Visit | Attending: Interventional Radiology | Admitting: Interventional Radiology

## 2015-04-25 ENCOUNTER — Encounter: Payer: Self-pay | Admitting: Vascular Surgery

## 2015-04-25 DIAGNOSIS — I517 Cardiomegaly: Secondary | ICD-10-CM | POA: Insufficient documentation

## 2015-04-25 DIAGNOSIS — Z0189 Encounter for other specified special examinations: Secondary | ICD-10-CM | POA: Insufficient documentation

## 2015-04-25 DIAGNOSIS — I743 Embolism and thrombosis of arteries of the lower extremities: Secondary | ICD-10-CM | POA: Diagnosis not present

## 2015-04-25 DIAGNOSIS — I998 Other disorder of circulatory system: Secondary | ICD-10-CM | POA: Diagnosis present

## 2015-04-25 DIAGNOSIS — I70229 Atherosclerosis of native arteries of extremities with rest pain, unspecified extremity: Secondary | ICD-10-CM

## 2015-04-25 DIAGNOSIS — K869 Disease of pancreas, unspecified: Secondary | ICD-10-CM | POA: Insufficient documentation

## 2015-04-25 LAB — POCT I-STAT CREATININE: Creatinine, Ser: 1.1 mg/dL — ABNORMAL HIGH (ref 0.44–1.00)

## 2015-04-25 MED ORDER — IOHEXOL 350 MG/ML SOLN
150.0000 mL | Freq: Once | INTRAVENOUS | Status: AC | PRN
  Administered 2015-04-25: 100 mL via INTRAVENOUS

## 2015-04-30 ENCOUNTER — Telehealth: Payer: Self-pay

## 2015-04-30 ENCOUNTER — Encounter (HOSPITAL_BASED_OUTPATIENT_CLINIC_OR_DEPARTMENT_OTHER): Payer: Medicare Other | Attending: Internal Medicine

## 2015-04-30 DIAGNOSIS — I87392 Chronic venous hypertension (idiopathic) with other complications of left lower extremity: Secondary | ICD-10-CM | POA: Diagnosis not present

## 2015-04-30 DIAGNOSIS — I4891 Unspecified atrial fibrillation: Secondary | ICD-10-CM | POA: Diagnosis not present

## 2015-04-30 DIAGNOSIS — L89622 Pressure ulcer of left heel, stage 2: Secondary | ICD-10-CM | POA: Insufficient documentation

## 2015-04-30 DIAGNOSIS — Z86718 Personal history of other venous thrombosis and embolism: Secondary | ICD-10-CM | POA: Diagnosis not present

## 2015-04-30 DIAGNOSIS — I739 Peripheral vascular disease, unspecified: Secondary | ICD-10-CM | POA: Diagnosis not present

## 2015-04-30 DIAGNOSIS — L97529 Non-pressure chronic ulcer of other part of left foot with unspecified severity: Secondary | ICD-10-CM | POA: Diagnosis not present

## 2015-04-30 DIAGNOSIS — L97822 Non-pressure chronic ulcer of other part of left lower leg with fat layer exposed: Secondary | ICD-10-CM | POA: Insufficient documentation

## 2015-04-30 DIAGNOSIS — Z7901 Long term (current) use of anticoagulants: Secondary | ICD-10-CM | POA: Diagnosis not present

## 2015-04-30 DIAGNOSIS — I10 Essential (primary) hypertension: Secondary | ICD-10-CM | POA: Insufficient documentation

## 2015-04-30 DIAGNOSIS — I87311 Chronic venous hypertension (idiopathic) with ulcer of right lower extremity: Secondary | ICD-10-CM | POA: Diagnosis not present

## 2015-04-30 DIAGNOSIS — L97422 Non-pressure chronic ulcer of left heel and midfoot with fat layer exposed: Secondary | ICD-10-CM | POA: Insufficient documentation

## 2015-04-30 NOTE — Telephone Encounter (Signed)
What procedure is to be performed? Okay to hold Coumadin 4 days prior to procedure Consider checking PT/INR prior to procedure to confirm less than 1.5 No Lovenox bridge necessary

## 2015-04-30 NOTE — Telephone Encounter (Signed)
Henri from [facility] (Dr. Kenna GilbertWagner's office) called and states that pt is on coumadin for atrial fib.  Pt will be scheduled for a procedure on next week and recommendations for her coumadin and Lovenox.  They need to know if pt can come off coumadin for 4 days and they need to know if pt will need a Lovenox bridge.   Kristina Diaz please advise and then we will have Dr. Kirtland BouchardK review.  Thank you!

## 2015-05-01 ENCOUNTER — Other Ambulatory Visit: Payer: Self-pay | Admitting: *Deleted

## 2015-05-01 ENCOUNTER — Telehealth: Payer: Self-pay | Admitting: Internal Medicine

## 2015-05-01 MED ORDER — CETIRIZINE HCL 10 MG PO TABS: 10.0000 mg | ORAL_TABLET | Freq: Two times a day (BID) | ORAL | Status: DC

## 2015-05-01 MED ORDER — OXYCODONE HCL ER 10 MG PO T12A: 10.0000 mg | EXTENDED_RELEASE_TABLET | Freq: Two times a day (BID) | ORAL | Status: DC

## 2015-05-01 NOTE — Telephone Encounter (Signed)
[facility] called us back because no one is answering at [name], he gets getting a voice mail there. He just wanted to let us know that the patient had an allergic reaction to contrast and [name] gave her Benadryl for 3 x days and once they quit giving her the Benadryl the hives came back.

## 2015-05-01 NOTE — Telephone Encounter (Signed)
I spoke with Loma NewtonHenri, RN @ [facility] in regards coumadin and Lovenox for upcoming procedure.  Henri said that she could see Dr. Vernon PreyKwiatkowski's note in epic and that it was not necessary to fax over an order.

## 2015-05-01 NOTE — Telephone Encounter (Signed)
No Lovenox bridging required Note:  Dr. Loreta AveWagner may not require discontinuation of Coumadin depending on the endovascular procedure required

## 2015-05-01 NOTE — Telephone Encounter (Signed)
Discussed with Dr. Clent RidgesFry due to Dr. Kirtland BouchardK out. Dr. Clent RidgesFry gave order for Zyrtec 10 mg one tablet twice a day x 14 days for Hives.  Called [facility] and left detailed message for Wellness Nurse about orders for pt and that I was faxing order over to them and to [facility]. Any questions please call. Rx faxed.

## 2015-05-01 NOTE — Telephone Encounter (Signed)
Patient Name: Kristina Diaz  DOB: 12/03/1929    Initial Comment Caller states, Christ [facility]/ [facility] [phone] patient had an allergic reaction to contrast dyes, she got hives on face, she took Benadryl for 3 days, the Sx came back when the Benadryl stopped.    Nurse Assessment      Guidelines    Guideline Title Affirmed Question Affirmed Notes       Final Disposition User   Clinical Call Sherilyn CooterHenry, RN, Wade    Comments  [phone]- I called the number listed and reached an operator who transferred me and I received a voicemail. I left a generic message that I would call back.  I called back and reached a different operator, Tresa EndoKelly. I advised her that I had been transferred to a voicemail. She states that is the wellness area and they will call us back.  [phone]- I called and reached operator Tresa EndoKelly. She states she does not know if anybody is there or not they are on the 3rd floor. She transferred me. I again received the voicemail. I left a message for Thayer OhmChris to call the office.  I called the backline and provided this information to Gordonarol.

## 2015-05-01 NOTE — Telephone Encounter (Signed)
Arline AspCindy, can you please document that you spoke to Nesc[facility]Henri at [facility]. Thanks

## 2015-05-01 NOTE — Telephone Encounter (Signed)
Received [facility] from [facility] for pt's [facility] Rx. Rx printed and signed and [facility]ed to [facility].

## 2015-05-02 ENCOUNTER — Encounter: Payer: Medicare Other | Admitting: Vascular Surgery

## 2015-05-02 ENCOUNTER — Encounter (HOSPITAL_COMMUNITY): Payer: Medicare Other

## 2015-05-03 ENCOUNTER — Other Ambulatory Visit (HOSPITAL_COMMUNITY): Payer: Self-pay | Admitting: Interventional Radiology

## 2015-05-03 DIAGNOSIS — I70229 Atherosclerosis of native arteries of extremities with rest pain, unspecified extremity: Secondary | ICD-10-CM

## 2015-05-03 DIAGNOSIS — S81802A Unspecified open wound, left lower leg, initial encounter: Secondary | ICD-10-CM

## 2015-05-03 DIAGNOSIS — I998 Other disorder of circulatory system: Secondary | ICD-10-CM

## 2015-05-06 ENCOUNTER — Other Ambulatory Visit: Payer: Self-pay | Admitting: Radiology

## 2015-05-08 ENCOUNTER — Other Ambulatory Visit: Payer: Self-pay | Admitting: Radiology

## 2015-05-09 ENCOUNTER — Other Ambulatory Visit (HOSPITAL_COMMUNITY): Payer: Self-pay | Admitting: Interventional Radiology

## 2015-05-09 ENCOUNTER — Ambulatory Visit (HOSPITAL_COMMUNITY)
Admission: RE | Admit: 2015-05-09 | Discharge: 2015-05-09 | Disposition: A | Discharge status: Home / Self Care | Payer: Medicare Other | Source: Ambulatory Visit | Attending: Interventional Radiology | Admitting: Interventional Radiology

## 2015-05-09 ENCOUNTER — Encounter (HOSPITAL_COMMUNITY): Payer: Self-pay

## 2015-05-09 ENCOUNTER — Ambulatory Visit (HOSPITAL_BASED_OUTPATIENT_CLINIC_OR_DEPARTMENT_OTHER)
Admission: RE | Admit: 2015-05-09 | Discharge: 2015-05-09 | Disposition: A | Discharge status: Home / Self Care | Payer: Medicare Other | Source: Ambulatory Visit | Attending: Interventional Radiology | Admitting: Interventional Radiology

## 2015-05-09 DIAGNOSIS — M1990 Unspecified osteoarthritis, unspecified site: Secondary | ICD-10-CM

## 2015-05-09 DIAGNOSIS — E039 Hypothyroidism, unspecified: Secondary | ICD-10-CM

## 2015-05-09 DIAGNOSIS — I729 Aneurysm of unspecified site: Secondary | ICD-10-CM | POA: Diagnosis not present

## 2015-05-09 DIAGNOSIS — S81802A Unspecified open wound, left lower leg, initial encounter: Secondary | ICD-10-CM

## 2015-05-09 DIAGNOSIS — I110 Hypertensive heart disease with heart failure: Secondary | ICD-10-CM | POA: Insufficient documentation

## 2015-05-09 DIAGNOSIS — I9589 Other hypotension: Secondary | ICD-10-CM | POA: Diagnosis not present

## 2015-05-09 DIAGNOSIS — I70229 Atherosclerosis of native arteries of extremities with rest pain, unspecified extremity: Secondary | ICD-10-CM

## 2015-05-09 DIAGNOSIS — Z7901 Long term (current) use of anticoagulants: Secondary | ICD-10-CM | POA: Insufficient documentation

## 2015-05-09 DIAGNOSIS — I998 Other disorder of circulatory system: Secondary | ICD-10-CM

## 2015-05-09 DIAGNOSIS — M810 Age-related osteoporosis without current pathological fracture: Secondary | ICD-10-CM

## 2015-05-09 DIAGNOSIS — D649 Anemia, unspecified: Secondary | ICD-10-CM | POA: Insufficient documentation

## 2015-05-09 DIAGNOSIS — I97638 Postprocedural hematoma of a circulatory system organ or structure following other circulatory system procedure: Secondary | ICD-10-CM | POA: Insufficient documentation

## 2015-05-09 DIAGNOSIS — Z86718 Personal history of other venous thrombosis and embolism: Secondary | ICD-10-CM | POA: Insufficient documentation

## 2015-05-09 DIAGNOSIS — D62 Acute posthemorrhagic anemia: Secondary | ICD-10-CM | POA: Diagnosis not present

## 2015-05-09 DIAGNOSIS — L97829 Non-pressure chronic ulcer of other part of left lower leg with unspecified severity: Secondary | ICD-10-CM

## 2015-05-09 DIAGNOSIS — I4891 Unspecified atrial fibrillation: Secondary | ICD-10-CM

## 2015-05-09 DIAGNOSIS — I70248 Atherosclerosis of native arteries of left leg with ulceration of other part of lower left leg: Secondary | ICD-10-CM

## 2015-05-09 DIAGNOSIS — Y834 Other reconstructive surgery as the cause of abnormal reaction of the patient, or of later complication, without mention of misadventure at the time of the procedure: Secondary | ICD-10-CM

## 2015-05-09 DIAGNOSIS — I509 Heart failure, unspecified: Secondary | ICD-10-CM

## 2015-05-09 LAB — BASIC METABOLIC PANEL
ANION GAP: 9 (ref 5–15)
BUN: 19 mg/dL (ref 6–20)
CHLORIDE: 101 mmol/L (ref 101–111)
CO2: 30 mmol/L (ref 22–32)
Calcium: 9.1 mg/dL (ref 8.9–10.3)
Creatinine, Ser: 0.98 mg/dL (ref 0.44–1.00)
GFR calc Af Amer: >60 mL/min (ref >60)
GFR, EST NON AFRICAN AMERICAN: 52 mL/min — AB (ref >60)
Glucose, Bld: 142 mg/dL — ABNORMAL HIGH (ref 65–99)
POTASSIUM: 4.2 mmol/L (ref 3.5–5.1)
SODIUM: 140 mmol/L (ref 135–145)

## 2015-05-09 LAB — POCT ACTIVATED CLOTTING TIME
ACTIVATED CLOTTING TIME: 214 s
Activated Clotting Time: 198 seconds

## 2015-05-09 LAB — CBC
HEMATOCRIT: 31.5 % — AB (ref 36.0–46.0)
HEMOGLOBIN: 10 g/dL — AB (ref 12.0–15.0)
MCH: 29.3 pg (ref 26.0–34.0)
MCHC: 31.7 g/dL (ref 30.0–36.0)
MCV: 92.4 fL (ref 78.0–100.0)
Platelets: 172 10*3/uL (ref 150–400)
RBC: 3.41 MIL/uL — AB (ref 3.87–5.11)
RDW: 15.7 % — ABNORMAL HIGH (ref 11.5–15.5)
WBC: 3.7 10*3/uL — AB (ref 4.0–10.5)

## 2015-05-09 LAB — APTT: APTT: 32 s (ref 24–37)

## 2015-05-09 LAB — PROTIME-INR
INR: 1.25 (ref 0.00–1.49)
Prothrombin Time: 15.9 seconds — ABNORMAL HIGH (ref 11.6–15.2)

## 2015-05-09 MED ORDER — HEPARIN SODIUM (PORCINE) 1000 UNIT/ML IJ SOLN
INTRAMUSCULAR | Status: AC
  Filled 2015-05-09: qty 1

## 2015-05-09 MED ORDER — DIPHENHYDRAMINE HCL 50 MG/ML IJ SOLN
INTRAMUSCULAR | Status: AC | PRN
  Administered 2015-05-09: 25 mg via INTRAVENOUS

## 2015-05-09 MED ORDER — DIPHENHYDRAMINE HCL 50 MG/ML IJ SOLN
INTRAMUSCULAR | Status: AC
  Filled 2015-05-09: qty 1

## 2015-05-09 MED ORDER — SODIUM CHLORIDE 0.9 % IV SOLN
Freq: Once | INTRAVENOUS | Status: AC
  Administered 2015-05-09: 15:00:00 via INTRAVENOUS

## 2015-05-09 MED ORDER — FENTANYL CITRATE (PF) 100 MCG/2ML IJ SOLN
INTRAMUSCULAR | Status: AC
  Filled 2015-05-09: qty 6

## 2015-05-09 MED ORDER — HEPARIN SODIUM (PORCINE) 1000 UNIT/ML IJ SOLN
INTRAMUSCULAR | Status: AC | PRN
  Administered 2015-05-09: 8000 [IU] via INTRAVENOUS
  Administered 2015-05-09: 2000 [IU] via INTRAVENOUS

## 2015-05-09 MED ORDER — HYDROCODONE-ACETAMINOPHEN 5-325 MG PO TABS
ORAL_TABLET | ORAL | Status: AC
  Filled 2015-05-09: qty 1

## 2015-05-09 MED ORDER — FENTANYL CITRATE (PF) 100 MCG/2ML IJ SOLN
INTRAMUSCULAR | Status: AC | PRN
  Administered 2015-05-09 (×5): 25 ug via INTRAVENOUS

## 2015-05-09 MED ORDER — SODIUM CHLORIDE 0.9 % IV SOLN
INTRAVENOUS | Status: AC | PRN
  Administered 2015-05-09: 10 mL/h via INTRAVENOUS

## 2015-05-09 MED ORDER — HYDROCODONE-ACETAMINOPHEN 5-325 MG PO TABS
1.0000 | ORAL_TABLET | Freq: Once | ORAL | Status: AC
  Administered 2015-05-09: 1 via ORAL

## 2015-05-09 MED ORDER — NITROGLYCERIN 1 MG/10 ML FOR IR/CATH LAB
INTRA_ARTERIAL | Status: AC
  Administered 2015-05-09 (×2): 400 ug
  Filled 2015-05-09: qty 10

## 2015-05-09 MED ORDER — MIDAZOLAM HCL 2 MG/2ML IJ SOLN
INTRAMUSCULAR | Status: AC | PRN
  Administered 2015-05-09 (×5): 0.5 mg via INTRAVENOUS

## 2015-05-09 MED ORDER — IODIXANOL 320 MG/ML IV SOLN
300.0000 mL | Freq: Once | INTRAVENOUS | Status: AC | PRN
  Administered 2015-05-09: 150 mL via INTRAVENOUS

## 2015-05-09 MED ORDER — ALTEPLASE 2 MG IJ SOLR
4.0000 mg | Freq: Once | INTRAMUSCULAR | Status: AC
  Administered 2015-05-09: 2 mg
  Filled 2015-05-09: qty 4

## 2015-05-09 MED ORDER — DIPHENHYDRAMINE HCL 50 MG/ML IJ SOLN
INTRAMUSCULAR | Status: AC
  Administered 2015-05-09: 25 mg
  Filled 2015-05-09: qty 1

## 2015-05-09 MED ORDER — LIDOCAINE HCL 1 % IJ SOLN
INTRAMUSCULAR | Status: AC
  Filled 2015-05-09: qty 20

## 2015-05-09 MED ORDER — VERAPAMIL HCL 2.5 MG/ML IV SOLN
INTRAVENOUS | Status: AC
  Administered 2015-05-09: 2.5 mg
  Filled 2015-05-09: qty 2

## 2015-05-09 MED ORDER — CLOPIDOGREL BISULFATE 75 MG PO TABS
300.0000 mg | ORAL_TABLET | Freq: Every day | ORAL | Status: DC
Start: 2015-05-09 — End: 2015-05-10
  Administered 2015-05-09: 300 mg via ORAL
  Filled 2015-05-09: qty 4

## 2015-05-09 MED ORDER — CLOPIDOGREL BISULFATE 75 MG PO TABS: 75.0000 mg | ORAL_TABLET | Freq: Every day | ORAL | Status: DC

## 2015-05-09 MED ORDER — MIDAZOLAM HCL 2 MG/2ML IJ SOLN
INTRAMUSCULAR | Status: AC
  Filled 2015-05-09: qty 6

## 2015-05-09 MED ORDER — DIPHENHYDRAMINE HCL 50 MG/ML IJ SOLN: 25.0000 mg | Freq: Once | INTRAMUSCULAR | Status: DC

## 2015-05-09 MED ORDER — SODIUM CHLORIDE 0.9 % IV SOLN: Freq: Once | INTRAVENOUS | Status: DC

## 2015-05-09 NOTE — Sedation Documentation (Signed)
Patient is resting comfortably. 

## 2015-05-09 NOTE — Procedures (Signed)
Interventional Radiology Procedure Note  Procedure: Left popliteal and tibial revascularization, with PTA of popliteal, AT, peroneal artery.  US guided antegrade left SFA access, US guided DP access at the foot.    Findings:  Occluded popliteal artery, TP trunk, and AT.   Revascularization of TP trunk/peroneal artery, as well as the AT. Spasm at the distal peroneal at conclusion, improving Palpable pulse at the conclusion of the case. .  Complications: Small hematoma at the left thigh puncture site.  Recommendations:  - Ok to shower tomorrow - Do not submerge for 7 days - Routine care  - Plavix PO 300 mg once,  Initiate 75mg  PO daily - follow up in 3-4 weeks in VIR clinic with Dr. Loreta AveWagner with repeat non-invasive arterial exam.  4 hours left leg straight. - reg diet  Signed,  Yvone NeuJaime S. Loreta AveWagner, DO

## 2015-05-09 NOTE — Sedation Documentation (Signed)
Patient denies pain and is resting comfortably.  

## 2015-05-09 NOTE — Progress Notes (Signed)
Pt on arrival from IR pts skin is red and warm to touch denies itching and no welts. Pt has contrast allergy.  Left groin is marked/ measured with pen from moderate hematoma prior to arrival. Site same as prior to arrival per Textron IncLeslie RN. After transfer hematoma increased, pressure held for 20 minutes and Dr Loreta AveWagner arrived to assess. Left DP 2+.

## 2015-05-09 NOTE — Sedation Documentation (Addendum)
Pt with hx of lower back pain (L4) fracture and osteoporosis. Pt has difficulty lying flat for long periods at a time. Pt made comfortable with a elevated backing to help support her back in hopes of alleviating any discomfort she may experience during the procedure due to patient is more comfortable sitting in a 90 degree angle. Dr. Loreta AveWagner at bedside to assist Carolyne FiscalJulie IR tech and Fayrene FearingJames IR tech for proper positioning.

## 2015-05-09 NOTE — Sedation Documentation (Signed)
Patient is resting, does report feeling uncomfortable. Additional medication given. Vital signs stable

## 2015-05-09 NOTE — Sedation Documentation (Signed)
Patient is resting comfortably. Does report some discomfort.

## 2015-05-09 NOTE — Sedation Documentation (Signed)
Dr Loreta AveWagner in assessed groin again.  Looks and feels better, ok with pt going to short stay.

## 2015-05-09 NOTE — Sedation Documentation (Signed)
Pt reports feeling uncomfortable, additional medication given.

## 2015-05-09 NOTE — Sedation Documentation (Signed)
Pt transported to SS 13, dressing clean, dry, and intact. No change in hematoma from last assessment prior to leaving IR nurses station. Site assessed after transferring from stretcher to bed again with no change in hematoma, dressing clean, dry and intact. [facility]. Site reviewed with this RN and Materials engineerJodiet RN at pt bedside.   Victorino DikeLeslie Lanice Folden RN

## 2015-05-09 NOTE — Progress Notes (Signed)
Vascular tech here for duplex study.

## 2015-05-09 NOTE — Discharge Instructions (Signed)
Script for percocet and plavix given  Angiogram, Care After Refer to this sheet in the next few weeks. These instructions provide you with information about caring for yourself after your procedure. Your health care provider may also give you more specific instructions. Your treatment has been planned according to current medical practices, but problems sometimes occur. Call your health care provider if you have any problems or questions after your procedure. WHAT TO EXPECT AFTER THE PROCEDURE After your procedure, it is typical to have the following:  Bruising at the catheter insertion site that usually fades within 1-2 weeks.  Blood collecting in the tissue (hematoma) that may be painful to the touch. It should usually decrease in size and tenderness within 1-2 weeks. HOME CARE INSTRUCTIONS  Take medicines only as directed by your health care provider.  You may shower 24-48 hours after the procedure or as directed by your health care provider. Remove the bandage (dressing) and gently wash the site with plain soap and water. Pat the area dry with a clean towel. Do not rub the site, because this may cause bleeding.  Do not take baths, swim, or use a hot tub until your health care provider approves.  Check your insertion site every day for redness, swelling, or drainage.  Do not apply powder or lotion to the site.  Do not lift over 10 lb (4.5 kg) for 5 days after your procedure or as directed by your health care provider.  Ask your health care provider when it is okay to:  Return to work or school.  Resume usual physical activities or sports.  Resume sexual activity.  Do not drive home if you are discharged the same day as the procedure. Have someone else drive you.  You may drive 24 hours after the procedure unless otherwise instructed by your health care provider.  Do not operate machinery or power tools for 24 hours after the procedure or as directed by your health care  provider.  If your procedure was done as an outpatient procedure, which means that you went home the same day as your procedure, a responsible adult should be with you for the first 24 hours after you arrive home.  Keep all follow-up visits as directed by your health care provider. This is important. SEEK MEDICAL CARE IF:  You have a fever.  You have chills.  You have increased bleeding from the catheter insertion site. Hold pressure on the site. SEEK IMMEDIATE MEDICAL CARE IF:  You have unusual pain at the catheter insertion site.  You have redness, warmth, or swelling at the catheter insertion site.  You have drainage (other than a small amount of blood on the dressing) from the catheter insertion site.  The catheter insertion site is bleeding, and the bleeding does not stop after 30 minutes of holding steady pressure on the site.  The area near or just beyond the catheter insertion site becomes pale, cool, tingly, or numb.   This information is not intended to replace advice given to you by your health care provider. Make sure you discuss any questions you have with your health care provider.   Document Released: 10/30/2004 Document Revised: 05/04/2014 Document Reviewed: 09/14/2012 Elsevier Interactive Patient Education [employer] Elsevier Inc.

## 2015-05-09 NOTE — Sedation Documentation (Signed)
Rewrapping pt with blankets, noticed chest and trunk red, similar to previous allergic reaction.  Dr Loreta AveWagner notified.  Benadryl ordered.

## 2015-05-09 NOTE — Sedation Documentation (Signed)
Left lower extremity wrapped with Kerlex. Wound RN is to assess pt's LLE tomorrow and redress.

## 2015-05-09 NOTE — Sedation Documentation (Signed)
Received from IR 1 to holding bay 2 in radiology.  Ice pack in place.  VSS, groin unchanged, po's given.

## 2015-05-09 NOTE — H&P (Signed)
Chief Complaint: Patient was seen in consultation today for nonhealing left leg ulcer at the request of Wagner,Jaime  Referring Physician(s): Wagner,Jaime  History of Present Illness: Kristina Diaz is a 82 y.o. female with nonhealing LLE ulcer. She was seen in consult by Dr. Loreta Ave and is scheduled for LLE arterial and venous angiograms with possible intervention. See consult note from 04/02/15 for details. Pt has held her Coumadin last 5 days. Feels well this am. Has been NPO  Past Medical History  Diagnosis Date  . PAD (peripheral artery disease) (HCC) 2013    left leg. sounds like she had angio-plasty   . Atrial fibrillation (HCC) 2013    s/p [facility] that failed. ON medication  . Hypertension   . Osteoporosis   . DVT (deep venous thrombosis) (HCC) 2013    "LLE" (08/04/2012)  . Hypothyroidism   . History of blood transfusion 2013    "related to thrombectomy" (08/04/2012)  . Anemia     "slightly" (08/04/2012)  . Arthritis     "probably minor; knees, pinky" (08/04/2012)  . Fracture of L4 vertebra (HCC)     "dx'd in 2013; can't treat til legs fixed" (08/04/2012)  . Lower extremity ulceration (HCC)   . Biventricular heart failure with reduced left ventricular function [facility])     Past Surgical History  Procedure Laterality Date  . Thrombectomy Left 2013    "leg" (08/04/2012)  . G2p2    . Tonsillectomy and adenoidectomy  ~ 1938  . Dilation and curettage of uterus  1960's    Allergies: Bactrim ds; Ciprofloxacin; and Tape  Medications: Prior to Admission medications   Medication Sig Start Date End Date Taking? Authorizing Provider  cetirizine (ZYRTEC) 10 MG tablet Take 1 tablet (10 mg total) by mouth 2 (two) times daily. X 14 days 05/01/15  Yes Nelwyn Salisbury, MD  collagenase (SANTYL) ointment Apply 1 application topically every other day. To bilateral lower extremities wounds   Yes Historical Provider, MD  docusate sodium (COLACE) 100 MG capsule Take 100 mg by mouth daily.   Yes  Historical Provider, MD  levothyroxine (SYNTHROID, LEVOTHROID) 25 MCG tablet TAKE 1 AND 1/2 TABS (37.5MG ) BY MOUTH EVERY DAY*CHECK PULSE WEEKLY* 07/14/12  Yes Jacques Navy, MD  metoprolol succinate (TOPROL-XL) 25 MG 24 hr tablet Take 0.5 tablets (12.5 mg total) by mouth daily. 11/01/12  Yes Jacques Navy, MD  Multiple Vitamin (MULTIVITAMIN WITH MINERALS) TABS Take 1 tablet by mouth every morning.   Yes Historical Provider, MD  nystatin (MYCOSTATIN) powder Apply topically 4 (four) times daily. 07/08/12  Yes Jacques Navy, MD  oxyCODONE (OXYCONTIN) 10 mg 12 hr tablet Take 1 tablet (10 mg total) by mouth every 12 (twelve) hours. 05/01/15  Yes Gordy Savers, MD  potassium chloride (K-DUR,KLOR-CON) 10 MEQ tablet Take 1 tablet (10 mEq total) by mouth 2 (two) times daily. 08/11/12  Yes Hadassah Pais, PA-C  silver sulfADIAZINE (SILVADENE) 1 % cream Apply 1 application topically 2 (two) times daily. Apply to wounds 1-2 times daily as directed.   Yes Historical Provider, MD  torsemide (DEMADEX) 20 MG tablet TAKE 1 TAB BY MOUTH EVERY DAY -- DX: EDEMA AND TAKE 1 TAB BY MOUTH AT 5PM ON MON WED FRI (THIS IS.IN ADDITION TO MORNING SCHEDULE DOSE) -... Patient taking differently: Take 1 tablet by mouth every morning at 8 am in addition to toresemide 20mg  three times weekly 04/24/14  Yes Laurey Morale, MD  torsemide (DEMADEX) 20 MG tablet  Take 20 mg by mouth every Monday, Wednesday, and Friday. At 5 pm in addition to daily torsemide 20mg    Yes Historical Provider, MD  warfarin (COUMADIN) 2 MG tablet Take 2 mg by mouth See admin instructions. Take 2 mg by mouth with a 2.5 mg tablet on Mon, Wed, Thurs, Sat, Sun.   Yes Historical Provider, MD  warfarin (COUMADIN) 2.5 MG tablet Take 2.5 mg by mouth See admin instructions. Take 2.5mg  tablet by mouth with a 2mg  tablet daily on Mon, Wed, Thurs, and Sat, Sun.   Yes Historical Provider, MD  warfarin (COUMADIN) 3 MG tablet Take 3 mg by mouth See admin instructions.  Take 1 tablet by mouth every Tuesday and Friday   Yes Historical Provider, MD     Family History  Problem Relation Age of Onset  . Arthritis Mother   . Cancer Mother     breast cancer   . Cancer Brother     lung cancer  . Tuberculosis Father   . Cancer Sister     stg 4 breast cancer  . Diabetes Neg Hx   . Heart disease Neg Hx   . COPD Neg Hx     Social History   Social History  . Marital Status: Married    Spouse Name: N/A  . Number of Children: 2  . Years of Education: 13   Occupational History  . retired    Social History Main Topics  . Smoking status: Never Smoker   . Smokeless tobacco: Never Used  . Alcohol Use: 0.0 oz/week     Comment: 08/04/2012 "used to have 1-2 glasses of wine/wk; nothing for the past 1 yr"  . Drug Use: No  . Sexual Activity: No   Other Topics Concern  . None   Social History Narrative   HSG. 1 year of college. Married: 1959: 2 sons - '63, '70: 3 grandchildren. Work - Animal nutritionist. Have been living with husband in [place]. ACP/Living will - HCPOA both sons. Recommended going to [email or web address].                     Review of Systems: A 12 point ROS discussed and pertinent positives are indicated in the HPI above.  All other systems are negative.  Review of Systems  Vital Signs: BP 132/85 mmHg  Pulse 106  Temp(Src) 97.6 F (36.4 C) (Oral)  Resp 16  Ht 5\' 4"  (1.626 m)  Wt 126 lb (57.153 kg)  BMI 21.62 kg/m2  SpO2 99%  Physical Exam  Constitutional: She is oriented to person, place, and time. She appears well-developed and well-nourished. No distress.  HENT:  Head: Normocephalic.  Mouth/Throat: Oropharynx is clear and moist.  Neck: Normal range of motion. No tracheal deviation present.  Cardiovascular: Normal rate, regular rhythm, normal heart sounds and intact distal pulses.   Pulmonary/Chest: Effort normal and breath sounds normal. No respiratory distress.  Abdominal: Soft. She exhibits no mass.  There is no tenderness.  Musculoskeletal:  LLE with dressing over lower leg wound  Neurological: She is alert and oriented to person, place, and time.  Psychiatric: She has a normal mood and affect. Judgment normal.    Mallampati Score:  MD Evaluation Airway: WNL Heart: WNL Abdomen: WNL Chest/ Lungs: WNL ASA  Classification: 3 Mallampati/Airway Score: One  Imaging: Ct Angio Ao+bifem W/cm &/or Wo/cm  04/25/2015  CLINICAL DATA:  82 year old female with a history of a chronic wound of the left lower extremity. Previous noninvasive arterial examination  demonstrates arterial occlusive disease. EXAM: CT ANGIOGRAPHY OF ABDOMINAL AORTA WITH ILIOFEMORAL RUNOFF TECHNIQUE: Multidetector CT imaging of the abdomen, pelvis and lower extremities was performed using the standard protocol during bolus administration of intravenous contrast. Multiplanar CT image reconstructions and MIPs were obtained to evaluate the vascular anatomy. CONTRAST:  OMNIPAQUE IOHEXOL 350 MG/ML SOLN COMPARISON:  MRI 01/23/2015 FINDINGS: Vascular: Aorta: Scattered calcified and soft plaque throughout the visualized lower thoracic and abdominal aorta. No aneurysm. There is a linear hypodensity associated with the right aspect of the aorta at the L3 level along the right aspect of the intima, potentially a chronic dissection or chronic fold given the patient's position. No periaortic fluid or inflammatory changes. Mesenteric vasculature: Calcifications at the origin of the celiac artery and superior mesenteric artery. There is likely at least 50% narrowing of the celiac artery origin though the branch vessels are patent. Single left renal artery origin is patent. Single right renal artery origin is patent, with calcifications at the origin with likely 50% stenosis. Inferior mesenteric artery origin is patent. Vein vasculature: Uncertain patency of the IVC and iliac veins, as the timing of the contrast bolus does not opacified the  venous system. Right Lower Extremity: Scattered calcifications of the right iliac system. Normal course caliber and contour of the right iliac system with no aneurysm or dissection flap. Right hypogastric artery is patent including the anterior and posterior divisions. Right common femoral artery patent. Right profunda femoris is patent. Length of the superficial femoral artery is patent with no significant atherosclerotic changes or stenosis. Popliteal artery patent. Anterior tibial artery patent from the origin to the ankle. Posterior tibial artery and peroneal artery appear patent from the origin to the ankle. Left Lower Extremity: Minimal atherosclerotic changes of the left iliac system. Normal course caliber and contour with no dissection or aneurysm. Hypogastric arteries patent including anterior posterior divisions. External iliac artery patent. Common femoral artery patent. Profunda femoris patent. Length of the superficial femoral artery is patent through the adductor canal. No significant atherosclerotic changes. The popliteal artery occludes approximately at the knee joint. There are surgical clips and surgical changes within the soft tissues at the site of occlusion of the popliteal artery. Geniculate arteries/collaterals contribute to reconstitution of the tibial artery in the proximal third and the peroneal artery in the proximal third. No significant filling of the posterior tibial artery. Additional: Multiple tortuous venous structures in the superficial tissues of the left lower extremity. Nonvascular: Lower chest: Collateral venous drainage of the right greater than left chest wall and abdominal wall. No axillary adenopathy. Respiratory motion somewhat limits evaluation of the lung bases. No confluent airspace disease or pleural effusion. Minimal atelectasis. Heart is enlarged, with pronounced enlargement of the left atrium and right atrium. Trace calcifications of the aortic valve. Small hiatal  hernia. Abdomen/pelvis: Unremarkable appearance of liver, spleen, and adrenal glands. No abnormally distended small bowel or colon. Appendix is not visualized, however, no inflammatory changes are present adjacent to the cecum to indicate an appendicitis. Low-density/cystic structure in the head of the pancreas associated with the bile ducts with greatest diameter of 17 mm. Questionable ductal dilatation of the pancreatic duct at the pancreatic body and pancreatic tail. No free fluid.  No free intraperitoneal air. Unremarkable appearance of the urinary bladder. Unremarkable appearance of the uterus and adnexa. Symmetric enhancement of the bilateral kidneys. No nephrolithiasis. No hydronephrosis. 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 Nonspecific pelvic floor laxity. Musculoskeletal: Borderline enlarged lymph nodes of the bilateral inguinal  region and of the left popliteal region, presumably reactive given the patient's known chronic wound of the left lower extremity. Diffuse osteopenia. Accentuated kyphotic curvature of the thoracic spine is evident given the positioning. Compression fracture of L2 is essentially unchanged from the plain film dated 12/15/2012. No significant bony canal narrowing. Multilevel degenerative disc disease throughout the visualized spine. Advanced degenerative changes of the L5-S1 level with associated facet disease. No acute displaced fracture. 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 Soft tissue changes of the left calf anterior and posterior, compatible with the patient's known inflammation/ wound. Surgical changes of the posterior left distal thigh and calf. Review of the MIP images confirms the above findings. IMPRESSION: Vascular: Minimal atherosclerotic changes of the visualized aorta, iliac system, and bilateral femoral popliteal system. Major finding of the left lower extremity is a popliteal artery occlusion at the level of the knee joint in the region of  surgical changes, with reconstitution of the tibial artery and peroneal artery via collateral flow. No significant appreciable flow within the posterior tibial artery. Extensive venous collaterals of the abdominal and chest wall, which are more pronounced on the right side. The iliac veins and IVC are not well evaluated on the current study given the timing of the contrast bolus. If there were concern for ilio-caval occlusion, correlation with duplex study may be useful, or alternatively cross-sectional imaging with the venous phase. Reactive lymph nodes of the left greater than right inguinal region and left popliteal region. Associated CT imaging findings of the patient's known left lower extremity chronic soft tissue wound. Cardiomegaly. Nonvascular: Cystic lesion measuring 17 mm in the head of the pancreas associated with the extrahepatic biliary ducts. Most likely cause would be a ductal dilatation in a patient of this age or a pseudocyst. Alternatively, cystic neoplasm could have this appearance, and if there were clinical concern for further evaluation, MRI could be considered as a more specific/ sensitive test of choice. Signed, Yvone Neu. Loreta Ave, DO [facility] Radiology Electronically Signed   By: Gilmer Mor D.O.   On: 04/25/2015 17:40    Labs:  CBC:  Recent Labs  01/26/15 0250 01/27/15 0447 02/14/15 1331 05/09/15 0719  WBC 5.4 4.7 5.8 3.7*  HGB 9.5* 9.3* 10.1* 10.0*  HCT 29.2* 28.8* 31.3* 31.5*  PLT 118* 113* 199.0 172    COAGS:  Recent Labs  01/23/15 1246  03/05/15 03/26/15 04/23/15 05/09/15 0719  INR 2.89*  < > 2.4* 3.0* 2.8* 1.25  APTT 40*  --   --   --   --  32  < > = values in this interval not displayed.  BMP:  Recent Labs  01/25/15 0228 01/26/15 0250 01/27/15 0447 02/14/15 1331 04/25/15 1238 05/09/15 0719  NA 140 139 138 141  --  140  K 3.8 3.5 3.5 3.8  --  4.2  CL 106 106 105 97  --  101  CO2 28 24 26  35*   --  30  GLUCOSE 94 85 81 85  --  142*  BUN 16 15 14 22   --  19  CALCIUM 8.3* 8.2* 8.1* 9.0  --  9.1  CREATININE 1.09* 1.04* 1.13* 1.02 1.10* 0.98  GFRNONAA 46* 48* 44*  --   --  52*  GFRAA 53* 56* 51*  --   --  >60    LIVER FUNCTION TESTS:  Recent Labs  01/24/15 0224 01/25/15 0228 01/26/15 0250 02/14/15 1331  BILITOT 1.1 0.7 0.5 0.7  AST 33 41 33 21  ALT 15 21 18 8   ALKPHOS 65 63 58 76  PROT 5.9* 6.0* 6.1* 7.6  ALBUMIN 2.4* 2.4* 2.3* 3.3*    TUMOR MARKERS: No results for input(s): AFPTM, CEA, CA199, CHROMGRNA in the last 8760 hours.  Assessment and Plan: LLE ulcer from PAD and venous insufficiency For LLE arteriogram and venogram with possible intervention Labs reviewed, ok Risks and Benefits discussed with the patient including, but not limited to bleeding, infection, vascular injury or contrast induced renal failure. All of the patient's questions were answered, patient is agreeable to proceed. Consent signed and in chart.    Electronically Signed: Brayton ElBRUNING, Tammera Engert 05/09/2015, 8:16 AM

## 2015-05-09 NOTE — Sedation Documentation (Signed)
Hematoma unchanged

## 2015-05-09 NOTE — Progress Notes (Signed)
VASCULAR LAB PRELIMINARY  PRELIMINARY  PRELIMINARY  PRELIMINARY  Left ultrasound of groin completed.    Preliminary report:  There is no obvious evidence of pseudoaneurysm or AV fistula noted in the left groin.   Aryan Sparks, RVT 05/09/2015, 4:09 PM

## 2015-05-09 NOTE — Sedation Documentation (Signed)
Pt has a small hematoma to Left groin, area has been marked with a skin marker and ice pack applied. Will continue to monitor pt at nurses station and reevaluate area at 1300.

## 2015-05-09 NOTE — Sedation Documentation (Addendum)
Pt restless, additional medication given  

## 2015-05-09 NOTE — Progress Notes (Signed)
Paged Dr. Loreta AveWagner to discuss the possibility of requesting an inpt bed from admitting. Awaiting vascular lab to come do doppler study. Dr. Loreta AveWagner stated that too early to request bed that we would be re-evaluating after vascular study. Pt with sandbag to left groin. Remains tachycardic with HR 120-155. BP 123/65.

## 2015-05-10 ENCOUNTER — Encounter (HOSPITAL_COMMUNITY): Payer: Self-pay | Admitting: Emergency Medicine

## 2015-05-10 ENCOUNTER — Inpatient Hospital Stay (HOSPITAL_COMMUNITY): Payer: Medicare Other

## 2015-05-10 ENCOUNTER — Emergency Department (HOSPITAL_COMMUNITY): Payer: Medicare Other

## 2015-05-10 ENCOUNTER — Inpatient Hospital Stay (HOSPITAL_COMMUNITY)
Admission: EM | Admit: 2015-05-10 | Discharge: 2015-05-13 | DRG: 811 | Payer: Medicare Other | Attending: Internal Medicine | Admitting: Internal Medicine

## 2015-05-10 DIAGNOSIS — T148 Other injury of unspecified body region: Secondary | ICD-10-CM

## 2015-05-10 DIAGNOSIS — E869 Volume depletion, unspecified: Secondary | ICD-10-CM | POA: Diagnosis present

## 2015-05-10 DIAGNOSIS — Z8679 Personal history of other diseases of the circulatory system: Secondary | ICD-10-CM

## 2015-05-10 DIAGNOSIS — I739 Peripheral vascular disease, unspecified: Secondary | ICD-10-CM | POA: Diagnosis present

## 2015-05-10 DIAGNOSIS — N179 Acute kidney failure, unspecified: Secondary | ICD-10-CM | POA: Diagnosis present

## 2015-05-10 DIAGNOSIS — Z9862 Peripheral vascular angioplasty status: Secondary | ICD-10-CM | POA: Diagnosis not present

## 2015-05-10 DIAGNOSIS — Z7901 Long term (current) use of anticoagulants: Secondary | ICD-10-CM

## 2015-05-10 DIAGNOSIS — Z91041 Radiographic dye allergy status: Secondary | ICD-10-CM | POA: Diagnosis not present

## 2015-05-10 DIAGNOSIS — I4891 Unspecified atrial fibrillation: Secondary | ICD-10-CM | POA: Diagnosis present

## 2015-05-10 DIAGNOSIS — Z8261 Family history of arthritis: Secondary | ICD-10-CM | POA: Diagnosis not present

## 2015-05-10 DIAGNOSIS — Z803 Family history of malignant neoplasm of breast: Secondary | ICD-10-CM

## 2015-05-10 DIAGNOSIS — D62 Acute posthemorrhagic anemia: Secondary | ICD-10-CM | POA: Diagnosis present

## 2015-05-10 DIAGNOSIS — Y92009 Unspecified place in unspecified non-institutional (private) residence as the place of occurrence of the external cause: Secondary | ICD-10-CM

## 2015-05-10 DIAGNOSIS — M7989 Other specified soft tissue disorders: Secondary | ICD-10-CM | POA: Diagnosis not present

## 2015-05-10 DIAGNOSIS — I5021 Acute systolic (congestive) heart failure: Secondary | ICD-10-CM | POA: Diagnosis present

## 2015-05-10 DIAGNOSIS — L7632 Postprocedural hematoma of skin and subcutaneous tissue following other procedure: Secondary | ICD-10-CM | POA: Diagnosis present

## 2015-05-10 DIAGNOSIS — E039 Hypothyroidism, unspecified: Secondary | ICD-10-CM | POA: Diagnosis present

## 2015-05-10 DIAGNOSIS — R55 Syncope and collapse: Secondary | ICD-10-CM

## 2015-05-10 DIAGNOSIS — W1830XA Fall on same level, unspecified, initial encounter: Secondary | ICD-10-CM | POA: Diagnosis present

## 2015-05-10 DIAGNOSIS — Z86718 Personal history of other venous thrombosis and embolism: Secondary | ICD-10-CM | POA: Diagnosis not present

## 2015-05-10 DIAGNOSIS — Z801 Family history of malignant neoplasm of trachea, bronchus and lung: Secondary | ICD-10-CM | POA: Diagnosis not present

## 2015-05-10 DIAGNOSIS — Z79899 Other long term (current) drug therapy: Secondary | ICD-10-CM

## 2015-05-10 DIAGNOSIS — T148XXA Other injury of unspecified body region, initial encounter: Secondary | ICD-10-CM

## 2015-05-10 DIAGNOSIS — D500 Iron deficiency anemia secondary to blood loss (chronic): Secondary | ICD-10-CM | POA: Insufficient documentation

## 2015-05-10 DIAGNOSIS — I9589 Other hypotension: Secondary | ICD-10-CM | POA: Diagnosis present

## 2015-05-10 DIAGNOSIS — I5022 Chronic systolic (congestive) heart failure: Secondary | ICD-10-CM | POA: Diagnosis present

## 2015-05-10 DIAGNOSIS — W19XXXA Unspecified fall, initial encounter: Secondary | ICD-10-CM

## 2015-05-10 DIAGNOSIS — L97929 Non-pressure chronic ulcer of unspecified part of left lower leg with unspecified severity: Secondary | ICD-10-CM | POA: Diagnosis present

## 2015-05-10 DIAGNOSIS — I959 Hypotension, unspecified: Secondary | ICD-10-CM | POA: Diagnosis present

## 2015-05-10 DIAGNOSIS — Z66 Do not resuscitate: Secondary | ICD-10-CM | POA: Diagnosis present

## 2015-05-10 DIAGNOSIS — I110 Hypertensive heart disease with heart failure: Secondary | ICD-10-CM | POA: Diagnosis present

## 2015-05-10 DIAGNOSIS — Z888 Allergy status to other drugs, medicaments and biological substances status: Secondary | ICD-10-CM

## 2015-05-10 DIAGNOSIS — I482 Chronic atrial fibrillation: Secondary | ICD-10-CM | POA: Diagnosis not present

## 2015-05-10 DIAGNOSIS — O2230 Deep phlebothrombosis in pregnancy, unspecified trimester: Secondary | ICD-10-CM

## 2015-05-10 LAB — I-STAT CG4 LACTIC ACID, ED
Lactic Acid, Venous: 2.59 mmol/L (ref 0.5–2.0)
Lactic Acid, Venous: 2.25 mmol/L (ref 0.5–2.0)

## 2015-05-10 LAB — COMPREHENSIVE METABOLIC PANEL
ALBUMIN: 2.2 g/dL — AB (ref 3.5–5.0)
ALK PHOS: 52 U/L (ref 38–126)
ALT: 23 U/L (ref 14–54)
AST: 74 U/L — AB (ref 15–41)
Anion gap: 16 — ABNORMAL HIGH (ref 5–15)
BILIRUBIN TOTAL: 0.9 mg/dL (ref 0.3–1.2)
BUN: 28 mg/dL — AB (ref 6–20)
CALCIUM: 8.3 mg/dL — AB (ref 8.9–10.3)
CO2: 23 mmol/L (ref 22–32)
Chloride: 101 mmol/L (ref 101–111)
Creatinine, Ser: 1.83 mg/dL — ABNORMAL HIGH (ref 0.44–1.00)
GFR calc Af Amer: 28 mL/min — ABNORMAL LOW (ref >60)
GFR calc non Af Amer: 24 mL/min — ABNORMAL LOW (ref >60)
GLUCOSE: 98 mg/dL (ref 65–99)
Potassium: 3.8 mmol/L (ref 3.5–5.1)
Sodium: 140 mmol/L (ref 135–145)
TOTAL PROTEIN: 5.6 g/dL — AB (ref 6.5–8.1)

## 2015-05-10 LAB — CBC WITH DIFFERENTIAL/PLATELET
BASOS PCT: 0 %
Basophils Absolute: 0 10*3/uL (ref 0.0–0.1)
EOS PCT: 0 %
Eosinophils Absolute: 0 10*3/uL (ref 0.0–0.7)
HEMATOCRIT: 24.7 % — AB (ref 36.0–46.0)
HEMOGLOBIN: 7.9 g/dL — AB (ref 12.0–15.0)
LYMPHS ABS: 0.5 10*3/uL — AB (ref 0.7–4.0)
Lymphocytes Relative: 3 %
MCH: 29.3 pg (ref 26.0–34.0)
MCHC: 32 g/dL (ref 30.0–36.0)
MCV: 91.5 fL (ref 78.0–100.0)
MONOS PCT: 1 %
Monocytes Absolute: 0.2 10*3/uL (ref 0.1–1.0)
Neutro Abs: 17.1 10*3/uL — ABNORMAL HIGH (ref 1.7–7.7)
Neutrophils Relative %: 96 %
Platelets: 175 10*3/uL (ref 150–400)
RBC: 2.7 MIL/uL — AB (ref 3.87–5.11)
RDW: 15.8 % — ABNORMAL HIGH (ref 11.5–15.5)
WBC MORPHOLOGY: INCREASED
WBC: 17.8 10*3/uL — AB (ref 4.0–10.5)

## 2015-05-10 LAB — PREPARE RBC (CROSSMATCH)

## 2015-05-10 LAB — URINALYSIS, ROUTINE W REFLEX MICROSCOPIC
Bilirubin Urine: NEGATIVE
GLUCOSE, UA: NEGATIVE mg/dL
KETONES UR: NEGATIVE mg/dL
Leukocytes, UA: NEGATIVE
Nitrite: NEGATIVE
PROTEIN: NEGATIVE mg/dL
Specific Gravity, Urine: 1.028 (ref 1.005–1.030)
pH: 5.5 (ref 5.0–8.0)

## 2015-05-10 LAB — URINE MICROSCOPIC-ADD ON

## 2015-05-10 LAB — MRSA PCR SCREENING: MRSA by PCR: NEGATIVE

## 2015-05-10 LAB — PROTIME-INR
INR: 1.62 — ABNORMAL HIGH (ref 0.00–1.49)
Prothrombin Time: 19.3 seconds — ABNORMAL HIGH (ref 11.6–15.2)

## 2015-05-10 LAB — ABO/RH: ABO/RH(D): B NEG

## 2015-05-10 LAB — OCCULT BLOOD, POC DEVICE: Fecal Occult Bld: NEGATIVE

## 2015-05-10 MED ORDER — PIPERACILLIN-TAZOBACTAM 3.375 G IVPB 30 MIN
3.3750 g | Freq: Once | INTRAVENOUS | Status: AC
  Administered 2015-05-10: 3.375 g via INTRAVENOUS
  Filled 2015-05-10: qty 50

## 2015-05-10 MED ORDER — DOCUSATE SODIUM 100 MG PO CAPS
100.0000 mg | ORAL_CAPSULE | Freq: Every day | ORAL | Status: DC
  Administered 2015-05-10 – 2015-05-13 (×4): 100 mg via ORAL
  Filled 2015-05-10 (×4): qty 1

## 2015-05-10 MED ORDER — SODIUM CHLORIDE 0.9 % IV BOLUS (SEPSIS)
500.0000 mL | Freq: Once | INTRAVENOUS | Status: AC
  Administered 2015-05-10: 500 mL via INTRAVENOUS

## 2015-05-10 MED ORDER — SODIUM CHLORIDE 0.9 % IV BOLUS (SEPSIS)
1000.0000 mL | Freq: Once | INTRAVENOUS | Status: AC
  Administered 2015-05-10: 1000 mL via INTRAVENOUS

## 2015-05-10 MED ORDER — POTASSIUM CHLORIDE CRYS ER 20 MEQ PO TBCR
10.0000 meq | EXTENDED_RELEASE_TABLET | Freq: Two times a day (BID) | ORAL | Status: DC
  Administered 2015-05-10 – 2015-05-13 (×7): 10 meq via ORAL
  Filled 2015-05-10 (×7): qty 1

## 2015-05-10 MED ORDER — PIPERACILLIN-TAZOBACTAM IN DEX 2-0.25 GM/50ML IV SOLN
2.2500 g | Freq: Three times a day (TID) | INTRAVENOUS | Status: DC
  Administered 2015-05-10 – 2015-05-11 (×2): 2.25 g via INTRAVENOUS
  Filled 2015-05-10 (×6): qty 50

## 2015-05-10 MED ORDER — VANCOMYCIN HCL IN DEXTROSE 1-5 GM/200ML-% IV SOLN
1000.0000 mg | Freq: Once | INTRAVENOUS | Status: AC
  Administered 2015-05-10: 1000 mg via INTRAVENOUS
  Filled 2015-05-10: qty 200

## 2015-05-10 MED ORDER — OXYCODONE HCL 5 MG PO TABS
5.0000 mg | ORAL_TABLET | Freq: Four times a day (QID) | ORAL | Status: DC | PRN
  Administered 2015-05-10 – 2015-05-13 (×6): 5 mg via ORAL
  Filled 2015-05-10 (×6): qty 1

## 2015-05-10 MED ORDER — SODIUM CHLORIDE 0.9 % IV SOLN: 10.0000 mL/h | Freq: Once | INTRAVENOUS | Status: DC

## 2015-05-10 MED ORDER — VANCOMYCIN HCL 500 MG IV SOLR
500.0000 mg | INTRAVENOUS | Status: DC
  Filled 2015-05-10: qty 500

## 2015-05-10 MED ORDER — LEVOTHYROXINE SODIUM 75 MCG PO TABS
37.5000 ug | ORAL_TABLET | Freq: Every day | ORAL | Status: DC
  Administered 2015-05-11 – 2015-05-13 (×3): 37.5 ug via ORAL
  Filled 2015-05-10 (×3): qty 1

## 2015-05-10 MED ORDER — DIPHENHYDRAMINE HCL 50 MG/ML IJ SOLN
25.0000 mg | Freq: Once | INTRAMUSCULAR | Status: DC
  Filled 2015-05-10: qty 1

## 2015-05-10 MED ORDER — ACETAMINOPHEN 325 MG PO TABS
650.0000 mg | ORAL_TABLET | Freq: Four times a day (QID) | ORAL | Status: DC | PRN
  Administered 2015-05-10 – 2015-05-12 (×4): 650 mg via ORAL
  Filled 2015-05-10 (×4): qty 2

## 2015-05-10 MED ORDER — ASPIRIN EC 81 MG PO TBEC
81.0000 mg | DELAYED_RELEASE_TABLET | Freq: Every day | ORAL | Status: DC
  Administered 2015-05-11 – 2015-05-13 (×3): 81 mg via ORAL
  Filled 2015-05-10 (×4): qty 1

## 2015-05-10 MED ORDER — HYDROCORTISONE NA SUCCINATE PF 100 MG IJ SOLR
200.0000 mg | Freq: Once | INTRAMUSCULAR | Status: DC
  Filled 2015-05-10: qty 4

## 2015-05-10 MED ORDER — ACETAMINOPHEN 650 MG RE SUPP: 650.0000 mg | Freq: Four times a day (QID) | RECTAL | Status: DC | PRN

## 2015-05-10 MED ORDER — SODIUM CHLORIDE 0.9 % IV SOLN
Freq: Once | INTRAVENOUS | Status: AC
  Administered 2015-05-10: 06:00:00 via INTRAVENOUS

## 2015-05-10 MED ORDER — SODIUM CHLORIDE 0.9 % IJ SOLN
3.0000 mL | Freq: Two times a day (BID) | INTRAMUSCULAR | Status: DC
  Administered 2015-05-10 – 2015-05-13 (×3): 3 mL via INTRAVENOUS

## 2015-05-10 MED ORDER — SODIUM CHLORIDE 0.9 % IV SOLN
INTRAVENOUS | Status: DC
  Administered 2015-05-10 – 2015-05-12 (×5): via INTRAVENOUS
  Administered 2015-05-12: 1000 mL via INTRAVENOUS

## 2015-05-10 NOTE — H&P (Signed)
[facility] History and Physical  Kristina Diaz YNW:[id number] DOB: 10/06/1929 DOA: 05/10/2015  Referring physician: Dr. Wilkie AyeHorton  PCP: Sonda PrimesAlex Plotnikov, MD   Chief Complaint: Syncope  HPI: Kristina Noelancy Shrader is a 82 y.o. female  Who has a history of PAD, afib,HTN DVT. Pt had an arteriogram done yesterday due to occluded popliteal artery At and peroneal artery. Pt returned to Assisted living facility and became dizzy falling to the floor this am. Patients family reports no loss of conciousness however pt was confused after fall.  Pt injured her right forearm.  Pt has 2 skin tears treated in ED. Pt complained of pain to her low back. Ct abdomen showed no evidence of retroperitoneal bleeding, no evidence of injury from fall.  Pt had a ct of her head due to fall and confusion after fall.  Ct showed no evidence of acute injury   Pt has had low blood pressures since arriving in ED.  She has received 1500cc of fluids. Pt has an EF of 45. Pt had an echo 9/16  Pt continues to be hypotensive.Pt's hemoglobin has dropped  From 10 to 7.9.   She has a hemotoma to the left groin from procedure.  Radiology did an ultrasound after procedure and reports no anyuerism. Pt was placed on plavix yesterday.   She previously was on coumadin for afib until 5 days ago when she was asked to stop before procedure.  Review of Systems:  Constitutional:  No weight loss, night sweats, Fevers, chills, fatigue.  HEENT:  No headaches, Difficulty swallowing,Tooth/dental problems,Sore throat,  No sneezing, itching, ear ache, nasal congestion, post nasal drip,  Cardio-vascular:  No chest pain, Orthopnea, PND, swelling in lower extremities, anasarca, dizziness, palpitations  GI:  No heartburn, indigestion, abdominal pain, nausea, vomiting, diarrhea, change in bowel habits, loss of appetite  Resp:  No shortness of breath with exertion or at rest. No excess mucus, no productive cough, No non-productive cough, No coughing up of blood.No  change in color of mucus.No wheezing.No chest wall deformity  Skin:  no rash or lesions.  GU:  no dysuria, change in color of urine, no urgency or frequency. No flank pain.  Musculoskeletal:  Skin tear right forearm, tender low back,  Swelling left groin  Psych:  No change in mood or affect. No depression or anxiety. No memory loss.   Past Medical History  Diagnosis Date  . PAD (peripheral artery disease) (HCC) 2013    left leg. sounds like she had angio-plasty   . [facility]  . Hypertension   . Osteoporosis   . DVT (deep venous thrombosis) (HCC) 2013    "LLE" (08/04/2012)  . Hypothyroidism   . History of blood transfusion 2013    "related to thrombectomy" (08/04/2012)  . Anemia     "slightly" (08/04/2012)  . Arthritis     "probably minor; knees, pinky" (08/04/2012)  . Fracture of L4 vertebra (HCC)     "dx'd in 2013; can't treat til legs fixed" (08/04/2012)  . Lower extremity ulceration (HCC)   . Biventricular heart failure with reduced left ventricular function [facility](HCC)    Past Surgical History  Procedure Laterality Date  . Thrombectomy Left 2013    "leg" (08/04/2012)  . G2p2    . Tonsillectomy and adenoidectomy  ~ 1938  . Dilation and curettage of uterus  1960's   Social History:  reports that she has never smoked. She has never used smokeless tobacco.  She reports that she drinks alcohol. She reports that she does not use illicit drugs.  Allergies  Allergen Reactions  . Bactrim Ds [Sulfamethoxazole-Trimethoprim] Rash  . Contrast Media [Iodinated Diagnostic Agents] Hives    BLE erythema   . Ciprofloxacin Rash  . Tape Rash    Family History  Problem Relation Age of Onset  . Arthritis Mother   . Cancer Mother     breast cancer   . Cancer Brother     lung cancer  . Tuberculosis Father   . Cancer Sister     stg 4 breast cancer  . Diabetes Neg Hx   . Heart disease Neg Hx   . COPD Neg Hx      Prior to  Admission medications   Medication Sig Start Date End Date Taking? Authorizing Provider  cetirizine (ZYRTEC) 10 MG tablet Take 1 tablet (10 mg total) by mouth 2 (two) times daily. X 14 days 05/01/15  Yes Nelwyn Salisbury, MD  clopidogrel (PLAVIX) 75 MG tablet Take 1 tablet (75 mg total) by mouth daily. 05/09/15  Yes Gilmer Mor, DO  collagenase (SANTYL) ointment Apply 1 application topically every other day. To bilateral lower extremities wounds   Yes Historical Provider, MD  docusate sodium (COLACE) 100 MG capsule Take 100 mg by mouth daily.   Yes Historical Provider, MD  levothyroxine (SYNTHROID, LEVOTHROID) 25 MCG tablet TAKE 1 AND 1/2 TABS (37.5MG ) BY MOUTH EVERY DAY*CHECK PULSE WEEKLY* 07/14/12  Yes Jacques Navy, MD  metoprolol succinate (TOPROL-XL) 25 MG 24 hr tablet Take 0.5 tablets (12.5 mg total) by mouth daily. 11/01/12  Yes Jacques Navy, MD  Multiple Vitamin (MULTIVITAMIN WITH MINERALS) TABS Take 1 tablet by mouth every morning.   Yes Historical Provider, MD  oxyCODONE (OXYCONTIN) 10 mg 12 hr tablet Take 1 tablet (10 mg total) by mouth every 12 (twelve) hours. 05/01/15  Yes Gordy Savers, MD  potassium chloride (K-DUR,KLOR-CON) 10 MEQ tablet Take 1 tablet (10 mEq total) by mouth 2 (two) times daily. 08/11/12  Yes Hadassah Pais, PA-C  predniSONE (DELTASONE) 50 MG tablet Take 50 mg by mouth once.   Yes Historical Provider, MD  silver sulfADIAZINE (SILVADENE) 1 % cream Apply 1 application topically 2 (two) times daily. Apply to wounds 1-2 times daily as directed.   Yes Historical Provider, MD  torsemide (DEMADEX) 20 MG tablet TAKE 1 TAB BY MOUTH EVERY DAY -- DX: EDEMA AND TAKE 1 TAB BY MOUTH AT 5PM ON MON WED FRI (THIS IS.IN ADDITION TO MORNING SCHEDULE DOSE) -... Patient taking differently: Take 1 tablet by mouth every morning at 8 am in addition to toresemide 20mg  three times weekly 04/24/14  Yes Laurey Morale, MD  torsemide [facility]) 20 MG tablet Take 20 mg by mouth every Monday,  Wednesday, and Friday. At 5 pm in addition to daily torsemide 20mg    Yes Historical Provider, MD  nystatin (MYCOSTATIN) powder Apply topically 4 (four) times daily. Patient not taking: Reported on 05/10/2015 07/08/12   Jacques Navy, MD   Physical Exam: Filed Vitals:   05/10/15 0600 05/10/15 0615 05/10/15 0700 05/10/15 0716  BP: 74/37 119/88 80/39   Pulse: 112 96  106  Temp:      TempSrc:      Resp: 23 25 20 22   SpO2: 98% 100%  100%    Wt Readings from Last 3 Encounters:  05/09/15 126 lb (57.153 kg)  04/02/15 124 lb (56.246 kg)  01/28/15 141 lb 8.6 oz (  64.2 kg)    General:  Appears calm and comfortable Eyes: PERRL, normal lids, irises & conjunctiva ENT: grossly normal hearing, lips & tongue Neck: no LAD, masses or thyromegaly Cardiovascular: RRR, no m/r/g. No LE edema. Telemetry: SR, no arrhythmias  Respiratory: CTA bilaterally, no w/r/r. Normal respiratory effort. Abdomen: soft, ntnd Skin: no rash or induration seen on limited exam Musculoskeletal: grossly normal tone BUE/BLE Psychiatric: grossly normal mood and affect, speech fluent and appropriate Neurologic: grossly non-focal.          Labs on Admission:  Basic Metabolic Panel:  Recent Labs Lab 05/09/15 0719 05/10/15 0143  NA 140 140  K 4.2 3.8  CL 101 101  CO2 30 23  GLUCOSE 142* 98  BUN 19 28*  CREATININE 0.98 1.83*  CALCIUM 9.1 8.3*   Liver Function Tests:  Recent Labs Lab 05/10/15 0143  AST 74*  ALT 23  ALKPHOS 52  BILITOT 0.9  PROT 5.6*  ALBUMIN 2.2*   No results for input(s): LIPASE, AMYLASE in the last 168 hours. No results for input(s): AMMONIA in the last 168 hours. CBC:  Recent Labs Lab 05/09/15 0719 05/10/15 0143  WBC 3.7* 17.8*  NEUTROABS  --  17.1*  HGB 10.0* 7.9*  HCT 31.5* 24.7*  MCV 92.4 91.5  PLT 172 175   Cardiac Enzymes: No results for input(s): CKTOTAL, CKMB, CKMBINDEX, TROPONINI in the last 168 hours.  BNP (last 3 results) No results for input(s): BNP in the  last 8760 hours.  ProBNP (last 3 results) No results for input(s): PROBNP in the last 8760 hours.  CBG: No results for input(s): GLUCAP in the last 168 hours.  Radiological Exams on Admission: Ct Abdomen Pelvis Wo Contrast  05/10/2015  CLINICAL DATA:  82 year old female with severe back pain concern for retroperitoneal hemorrhage. EXAM: CT ABDOMEN AND PELVIS WITHOUT CONTRAST TECHNIQUE: Multidetector CT imaging of the abdomen and pelvis was performed following the standard protocol without IV contrast. COMPARISON:  Radiograph dated 05/10/2015 and CT dated 04/25/2015 FINDINGS: Evaluation of this exam is limited in the absence of intravenous contrast. Evaluation is also limited due to streak artifact caused by patient's arms. Mild bibasilar linear atelectatic changes. There is cardiomegaly. There is coronary vascular calcification. No intra-abdominal free air. No free fluid. The liver appears unremarkable. High attenuating material within the gallbladder may represent vicariously excreted contrast from recent study or small gallstones. The pancreas is not well visualized. A 1.6 cm stable appearing hypodensity is again noted in the region of the head of the pancreas similar to the prior study. The spleen appears unremarkable. The adrenal glands are not well visualized. There is stable appearing low positioning and malrotation of the right kidney. There is no hydronephrosis on either side. There is excreted contrast in the right renal pelvis and within the urinary bladder. The uterus is grossly unremarkable. There is apparent thickening of the wall of the stomach which may be related to underdistention. Gastritis is not excluded. Clinical correlation is recommended. Constipation. Dense stool within the rectum may represent a degree of fecal impaction. There is no evidence of bowel obstruction. There is atherosclerotic calcification of the aorta. No portal venous gas identified. There is no lymphadenopathy. No  definite intraperitoneal or retroperitoneal hemorrhage noted. There is diffuse subcutaneous soft tissue stranding. Dilated and serpiginous collateral vasculature noted in the subcutaneous soft tissue of the right anterior abdominal wall. There is diffuse stranding of the subcutaneous tissues of the gluteal and perineal region. No fluid collection or hematoma identified.  There is osteopenia. Degenerative changes of the spine. There is L2 compression fracture with near complete loss of vertebral body height and anterior wedging similar to prior study. No acute fracture. IMPRESSION: No definite retroperitoneal or intraperitoneal hematoma identified. No hematoma seen in the visualized portions of the lower extremities. Electronically Signed   By: Elgie Collard M.D.   On: 05/10/2015 06:10   Dg Chest 2 View  05/10/2015  CLINICAL DATA:  82 year old female with hypotension and heart failure. EXAM: CHEST  2 VIEW COMPARISON:  Chest radiograph dated 01/23/2015 FINDINGS: Two views of the chest demonstrate stable cardiomegaly. No focal consolidation, pleural effusion, or pneumothorax. The osseous structures are grossly unremarkable. IMPRESSION: Stable cardiomegaly.  No acute cardiopulmonary process. Electronically Signed   By: Elgie Collard M.D.   On: 05/10/2015 06:19   Dg Lumbar Spine Complete  05/10/2015  CLINICAL DATA:  Fall, diffuse pain. EXAM: LUMBAR SPINE - COMPLETE 4+ VIEW COMPARISON:  Reformats from CT lower extremity angiogram 04/25/2015 FINDINGS: Severe compression deformity of L2 is unchanged from prior exam. There is otherwise no acute fracture. Mild anterolisthesis of L5 on S1 is unchanged from CT. Multilevel facet arthropathy is seen. Significant osteoporosis. Excreted contrast within kidneys and urinary bladder from angiogram yesterday. IMPRESSION: Chronic compression deformity of L3. No definite acute fracture. Severe osteoporosis. Electronically Signed   By: Rubye Oaks M.D.   On: 05/10/2015  03:32   Ct Head Wo Contrast  05/10/2015  CLINICAL DATA:  Lost her balance, fall to ground. Neck pain. Slurred speech. EXAM: CT HEAD WITHOUT CONTRAST CT CERVICAL SPINE WITHOUT CONTRAST TECHNIQUE: Multidetector CT imaging of the head and cervical spine was performed following the standard protocol without intravenous contrast. Multiplanar CT image reconstructions of the cervical spine were also generated. COMPARISON:  None. FINDINGS: CT HEAD FINDINGS No intracranial hemorrhage, mass effect, or midline shift. Generalized atrophy and chronic small vessel ischemia. No hydrocephalus. Portions of the skullbase are obscured by streak artifact from metallic dentures/dental hardware. The basilar cisterns are patent. No evidence of territorial infarct. No intracranial fluid collection. Calvarium is intact. Right maxillary sinuses moderate of, no paranasal sinus inflammatory change. Mild sclerosis but inferior left mastoid air cells. CT CERVICAL SPINE FINDINGS Exaggerated cervical lordosis. Diffuse osteoporosis. Vertebral body heights are preserved. There is no fracture. The dens is intact. There are no jumped or perched facets. Disc space narrowing at C5-C6. Mild multilevel facet arthropathy. No prevertebral soft tissue edema. The esophagus appears dilated were visualized. IMPRESSION: 1. Atrophy and chronic small vessel ischemia without acute intracranial abnormality. 2. Degenerative change in osteoporosis in the cervical spine. No acute fracture or subluxation. Electronically Signed   By: Rubye Oaks M.D.   On: 05/10/2015 03:50   Ct Cervical Spine Wo Contrast  05/10/2015  CLINICAL DATA:  Lost her balance, fall to ground. Neck pain. Slurred speech. EXAM: CT HEAD WITHOUT CONTRAST CT CERVICAL SPINE WITHOUT CONTRAST TECHNIQUE: Multidetector CT imaging of the head and cervical spine was performed following the standard protocol without intravenous contrast. Multiplanar CT image reconstructions of the cervical spine were  also generated. COMPARISON:  None. FINDINGS: CT HEAD FINDINGS No intracranial hemorrhage, mass effect, or midline shift. Generalized atrophy and chronic small vessel ischemia. No hydrocephalus. Portions of the skullbase are obscured by streak artifact from metallic dentures/dental hardware. The basilar cisterns are patent. No evidence of territorial infarct. No intracranial fluid collection. Calvarium is intact. Right maxillary sinuses moderate of, no paranasal sinus inflammatory change. Mild sclerosis but inferior left mastoid air cells. CT  CERVICAL SPINE FINDINGS Exaggerated cervical lordosis. Diffuse osteoporosis. Vertebral body heights are preserved. There is no fracture. The dens is intact. There are no jumped or perched facets. Disc space narrowing at C5-C6. Mild multilevel facet arthropathy. No prevertebral soft tissue edema. The esophagus appears dilated were visualized. IMPRESSION: 1. Atrophy and chronic small vessel ischemia without acute intracranial abnormality. 2. Degenerative change in osteoporosis in the cervical spine. No acute fracture or subluxation. Electronically Signed   By: Rubye Oaks M.D.   On: 05/10/2015 03:50   Ir Angiogram Extremity Left  05/09/2015  CLINICAL DATA:  82 year old female with a history of chronic nonhealing wound of the left lower extremity. She has a vague history of what sounds like embolism to the popliteal artery and surgical thrombectomy in [place] many years prior. Prior inpatient noninvasive study demonstrates abnormal waveforms distally, with nonpalpable pulses of the foot. CT angiogram and runoff confirms a popliteal occlusion as well as tibial occlusions. She presents today for angiogram and attempt at revascularization for wound healing capability. EXAM: ULTRASOUND GUIDED ACCESS LEFT SUPERFICIAL FEMORAL ARTERY ULTRASOUND GUIDED ACCESS LEFT DORSALIS PEDIS REVASCULARIZATION OF POPLITEAL OCCLUSION WITH BALLOON ANGIOPLASTY OF POPLITEAL ARTERY REVASCULARIZATION  OF LEFT ANTERIOR TIBIAL ARTERY WITH BALLOON ANGIOPLASTY REVASCULARIZATION OF LEFT TIBIOPERONEAL TRUNK AND PERONEAL ARTERY WITH BALLOON ANGIOPLASTY FLUOROSCOPY TIME:  25 minutes 54 seconds MEDICATIONS AND MEDICAL HISTORY: 2 mg intra-arterial tPA, 800 mcg nitroglycerin, 2.5 mg verapamil intra arterial ANESTHESIA/SEDATION: 2.5 mg Versed, 125 mcg fentanyl 180 minutes sedation time CONTRAST:  VISIPAQUE IODIXANOL 320 MG/ML IV SOLN COMPLICATIONS: Small puncture site hematoma at the left thigh.  SIR category 1. PROCEDURE: The procedure, risks, benefits, and alternatives were explained to the patient and the patient's family. Specific risks that were addressed included bleeding, infection, contrast reaction, kidney injury, need for further procedure, limb loss, cardiopulmonary collapse, death. Questions regarding the procedure were encouraged and answered. The patient understands and consents to the procedure. Proximal left thigh was prepped and draped in the usual sterile fashion. The left ankle was prepped and draped in the usual sterile fashion. Ultrasound survey of the left proximal thigh was performed with images stored and sent to PACs. A micropuncture needle was used access the left superficial femoral artery under ultrasound in antegrade fashion. With excellent arterial blood flow returned, and an .018 micro wire was passed through the needle, observed to enter the SFA under fluoroscopy. The needle was removed, and a micropuncture sheath was placed over the wire. The inner dilator and wire were removed, and an 035 Bentson wire was advanced under fluoroscopy. The sheath was removed and a standard 5 [place] vascular sheath was placed. The dilator was removed and the sheath was flushed. Angiogram was performed of the left lower extremity. Ultrasound survey of the left dorsalis pedis was performed with images stored and sent to PACs. A micropuncture needle was used to access the left dorsalis pedis artery under  ultrasound. With arterial blood flow returned, and an .018 micro wire was passed through the needle, observed to enter the distal anterior tibial artery under fluoroscopy. The needle was removed, and a micropuncture sheath was placed over the wire. The inner dilator and wire were removed. A pharmacologic cocktail was infused. Stiff Glidewire was then passed through the sheath in a retrograde fashion to the anterior tibial artery, traversing the occlusion at the proximal anterior tibial artery and at the popliteal artery. The wire stayed luminal in the superficial femoral artery. The SFA sheath was then upsized to 6 [place]. A snare  was used to snare the Glidewire from above. 035 quick cross catheter was then passed over the Glidewire to the distal anterior tibial artery. Down size to 018 system. Balloon angioplasty was then performed along the length of the anterior tibial artery with 2.0 mm diameter balloon, 2.5 mm diameter balloon, 3.0 mm diameter balloon. 2 mm balloon was used for hemostasis at the puncture site. Antegrade approach to the stump of the tibioperoneal trunk was used with a combination of Glidewire and quick cross catheter. Balloon angioplasty was performed with 2.5 mm diameter balloon and 3.0 mm diameter balloon at the origin, with restoration of flow. Spasm was induced in the mid and distal peroneal artery. Pedal pulse was confirmed at the conclusion of the case, which was not present at the initiation. All catheters and wires were removed. Sheath was removed from the superficial femoral artery, with manual pressure used for hemostasis. Patient tolerated the procedure well and remained hemodynamically stable throughout. No complications were encountered and no significant blood loss was encountered. FINDINGS: No palpable pulse at the initiation of the case. Doppler signals were present. Initial angiogram demonstrates no significant disease of the superficial femoral artery. Occlusion of the  popliteal artery at the joint space of the knee. Occlusion extends through the popliteal artery, tibioperoneal trunk, and proximal anterior tibial artery. Reconstitution of the distal anterior tibial artery and of the peroneal artery. No posterior tibial filling was present. After revascularization, there is in-line flow through the popliteal artery, anterior tibial artery, and peroneal artery to the ankle. Spasm induced in the mid and distal peroneal artery, with palpable pulse at the completion of the angiogram/treatment. IMPRESSION: Status post left lower extremity angiogram with antegrade and retrograde approach to popliteal occlusion and revascularization with plain balloon angioplasty restoring in-line flow through the popliteal artery, peroneal artery and anterior tibial artery to the ankle. Signed, Yvone Neu. Loreta Ave DO [facility] Radiology PLAN: p.o. Plavix 300 mg now. Left leg straight for 5 hours. Initiate 75 mg p.o. Plavix daily. Electronically Signed   By: Gilmer Mor D.O.   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 She has a vague history of what sounds like embolism to the popliteal artery and surgical thrombectomy in [place] many years prior. Prior inpatient noninvasive study demonstrates abnormal waveforms distally, with nonpalpable pulses of the foot. CT angiogram and runoff confirms a popliteal occlusion as well as tibial occlusions. She presents today for angiogram and attempt at revascularization for wound healing capability. EXAM: ULTRASOUND GUIDED ACCESS LEFT SUPERFICIAL FEMORAL ARTERY ULTRASOUND GUIDED ACCESS LEFT DORSALIS PEDIS REVASCULARIZATION OF POPLITEAL OCCLUSION WITH BALLOON ANGIOPLASTY OF POPLITEAL ARTERY REVASCULARIZATION OF LEFT ANTERIOR TIBIAL ARTERY WITH BALLOON ANGIOPLASTY REVASCULARIZATION OF LEFT  TIBIOPERONEAL TRUNK AND PERONEAL ARTERY WITH BALLOON ANGIOPLASTY FLUOROSCOPY TIME:  25 minutes 54 seconds MEDICATIONS AND MEDICAL HISTORY: 2 mg intra-arterial tPA, 800 mcg nitroglycerin, 2.5 mg verapamil intra arterial ANESTHESIA/SEDATION: 2.5 mg Versed, 125 mcg fentanyl 180 minutes sedation time CONTRAST:  VISIPAQUE IODIXANOL 320 MG/ML IV SOLN COMPLICATIONS: Small puncture site hematoma at the left thigh.  SIR category 1. PROCEDURE: The procedure, risks, benefits, and alternatives were explained to the patient and the patient's family. Specific risks that were addressed included bleeding, infection, contrast reaction, kidney injury, need for further procedure, limb loss, cardiopulmonary collapse, death. Questions regarding the procedure were encouraged and answered. The patient understands  and consents to the procedure. Proximal left thigh was prepped and draped in the usual sterile fashion. The left ankle was prepped and draped in the usual sterile fashion. Ultrasound survey of the left proximal thigh was performed with images stored and sent to PACs. A micropuncture needle was used access the left superficial femoral artery under ultrasound in antegrade fashion. With excellent arterial blood flow returned, and an .018 micro wire was passed through the needle, observed to enter the SFA under fluoroscopy. The needle was removed, and a micropuncture sheath was placed over the wire. The inner dilator and wire were removed, and an 035 Bentson wire was advanced under fluoroscopy. The sheath was removed and a standard 5 [place] vascular sheath was placed. The dilator was removed and the sheath was flushed. Angiogram was performed of the left lower extremity. Ultrasound survey of the left dorsalis pedis was performed with images stored and sent to PACs. A micropuncture needle was used to access the left dorsalis pedis artery under ultrasound. With arterial blood flow returned, and an .018 micro wire was passed  through the needle, observed to enter the distal anterior tibial artery under fluoroscopy. The needle was removed, and a micropuncture sheath was placed over the wire. The inner dilator and wire were removed. A pharmacologic cocktail was infused. Stiff Glidewire was then passed through the sheath in a retrograde fashion to the anterior tibial artery, traversing the occlusion at the proximal anterior tibial artery and at the popliteal artery. The wire stayed luminal in the superficial femoral artery. The SFA sheath was then upsized to 6 [place]. A snare was used to snare the Glidewire from above. 035 quick cross catheter was then passed over the Glidewire to the distal anterior tibial artery. Down size to 018 system. Balloon angioplasty was then performed along the length of the anterior tibial artery with 2.0 mm diameter balloon, 2.5 mm diameter balloon, 3.0 mm diameter balloon. 2 mm balloon was used for hemostasis at the puncture site. Antegrade approach to the stump of the tibioperoneal trunk was used with a combination of Glidewire and quick cross catheter. Balloon angioplasty was performed with 2.5 mm diameter balloon and 3.0 mm diameter balloon at the origin, with restoration of flow. Spasm was induced in the mid and distal peroneal artery. Pedal pulse was confirmed at the conclusion of the case, which was not present at the initiation. All catheters and wires were removed. Sheath was removed from the superficial femoral artery, with manual pressure used for hemostasis. Patient tolerated the procedure well and remained hemodynamically stable throughout. No complications were encountered and no significant blood loss was encountered. FINDINGS: No palpable pulse at the initiation of the case. Doppler signals were present. Initial angiogram demonstrates no significant disease of the superficial femoral artery. Occlusion of the popliteal artery at the joint space of the knee. Occlusion extends through the popliteal  artery, tibioperoneal trunk, and proximal anterior tibial artery. Reconstitution of the distal anterior tibial artery and of the peroneal artery. No posterior tibial filling was present. After revascularization, there is in-line flow through the popliteal artery, anterior tibial artery, and peroneal artery to the ankle. Spasm induced in the mid and distal peroneal artery, with palpable pulse at the completion of the angiogram/treatment. IMPRESSION: Status post left lower extremity angiogram with antegrade and retrograde approach to popliteal occlusion and revascularization with plain balloon angioplasty restoring in-line flow through the popliteal artery, peroneal artery and anterior tibial artery to the ankle. Signed, Yvone Neu. Loreta Ave, DO [facility] Radiology PLAN: p.o. Plavix 300 mg now. Left leg straight for 5 hours. Initiate 75 mg p.o. Plavix daily. Electronically Signed   By: Gilmer Mor D.O.   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 She has a vague history of what sounds like embolism to the popliteal artery and surgical thrombectomy in [place] many years prior. Prior inpatient noninvasive study demonstrates abnormal waveforms distally, with nonpalpable pulses of the foot. CT angiogram and runoff confirms a popliteal occlusion as well as tibial occlusions. She presents today for angiogram and attempt at revascularization for wound healing capability. EXAM: ULTRASOUND GUIDED ACCESS LEFT SUPERFICIAL FEMORAL ARTERY ULTRASOUND GUIDED ACCESS LEFT DORSALIS PEDIS REVASCULARIZATION OF POPLITEAL OCCLUSION WITH BALLOON ANGIOPLASTY OF POPLITEAL ARTERY REVASCULARIZATION OF LEFT ANTERIOR TIBIAL ARTERY WITH BALLOON ANGIOPLASTY REVASCULARIZATION OF LEFT TIBIOPERONEAL TRUNK AND PERONEAL ARTERY WITH BALLOON ANGIOPLASTY FLUOROSCOPY TIME:  25  minutes 54 seconds MEDICATIONS AND MEDICAL HISTORY: 2 mg intra-arterial tPA, 800 mcg nitroglycerin, 2.5 mg verapamil intra arterial ANESTHESIA/SEDATION: 2.5 mg Versed, 125 mcg fentanyl 180 minutes sedation time CONTRAST:  VISIPAQUE IODIXANOL 320 MG/ML IV SOLN COMPLICATIONS: Small puncture site hematoma at the left thigh.  SIR category 1. PROCEDURE: The procedure, risks, benefits, and alternatives were explained to the patient and the patient's family. Specific risks that were addressed included bleeding, infection, contrast reaction, kidney injury, need for further procedure, limb loss, cardiopulmonary collapse, death. Questions regarding the procedure were encouraged and answered. The patient understands and consents to the procedure. Proximal left thigh was prepped and draped in the usual sterile fashion. The left ankle was prepped and draped in the usual sterile fashion. Ultrasound survey of the left proximal thigh was performed with images stored and sent to PACs. A micropuncture needle was used access the left superficial femoral artery under ultrasound in antegrade fashion. With excellent arterial blood flow returned, and an .018 micro wire was passed through the needle, observed to enter the SFA under fluoroscopy. The needle was removed, and a micropuncture sheath was placed over the wire. The inner dilator and wire were removed, and an 035 Bentson wire was advanced under fluoroscopy. The sheath was removed and a standard 5 [place] vascular sheath was placed. The dilator was removed and the sheath was flushed. Angiogram was performed of the left lower extremity. Ultrasound survey of the left dorsalis pedis was performed with images stored and sent to PACs. A micropuncture needle was used to access the left dorsalis pedis artery under ultrasound. With arterial blood flow returned, and an .018 micro wire was passed through the needle, observed to enter the distal anterior tibial artery under fluoroscopy.  The needle was removed, and a micropuncture sheath was placed over the wire. The inner dilator and wire were removed. A pharmacologic cocktail was infused. Stiff Glidewire was then passed through the sheath in a retrograde fashion to the anterior tibial artery, traversing the occlusion at the proximal anterior tibial artery and at the popliteal artery. The wire stayed luminal in the superficial femoral artery. The SFA sheath was then upsized to 6 [place]. A snare was used to snare the Glidewire from above. 035 quick cross catheter was then passed over the Glidewire to the distal anterior tibial artery. Down size to 018 system. Balloon angioplasty was then performed along the length of the anterior tibial artery with 2.0 mm diameter balloon, 2.5 mm diameter  balloon, 3.0 mm diameter balloon. 2 mm balloon was used for hemostasis at the puncture site. Antegrade approach to the stump of the tibioperoneal trunk was used with a combination of Glidewire and quick cross catheter. Balloon angioplasty was performed with 2.5 mm diameter balloon and 3.0 mm diameter balloon at the origin, with restoration of flow. Spasm was induced in the mid and distal peroneal artery. Pedal pulse was confirmed at the conclusion of the case, which was not present at the initiation. All catheters and wires were removed. Sheath was removed from the superficial femoral artery, with manual pressure used for hemostasis. Patient tolerated the procedure well and remained hemodynamically stable throughout. No complications were encountered and no significant blood loss was encountered. FINDINGS: No palpable pulse at the initiation of the case. Doppler signals were present. Initial angiogram demonstrates no significant disease of the superficial femoral artery. Occlusion of the popliteal artery at the joint space of the knee. Occlusion extends through the popliteal artery, tibioperoneal trunk, and proximal anterior tibial artery. Reconstitution of the  distal anterior tibial artery and of the peroneal artery. No posterior tibial filling was present. After revascularization, there is in-line flow through the popliteal artery, anterior tibial artery, and peroneal artery to the ankle. Spasm induced in the mid and distal peroneal artery, with palpable pulse at the completion of the angiogram/treatment. IMPRESSION: Status post left lower extremity angiogram with antegrade and retrograde approach to popliteal occlusion and revascularization with plain balloon angioplasty restoring in-line flow through the popliteal artery, peroneal artery and anterior tibial artery to the ankle. Signed, Yvone Neu. Loreta Ave DO [facility] Radiology PLAN: p.o. Plavix 300 mg now. Left leg straight for 5 hours. Initiate 75 mg p.o. Plavix daily. Electronically Signed   By: Gilmer Mor D.O.   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 She has a vague history of what sounds like embolism to the popliteal artery and surgical thrombectomy in [place] many years prior. Prior inpatient noninvasive study demonstrates abnormal waveforms distally, with nonpalpable pulses of the foot. CT angiogram and runoff confirms a popliteal occlusion as well as tibial occlusions. She presents today for angiogram and attempt at revascularization for wound healing capability. EXAM: ULTRASOUND GUIDED ACCESS LEFT SUPERFICIAL FEMORAL ARTERY ULTRASOUND GUIDED ACCESS LEFT DORSALIS PEDIS REVASCULARIZATION OF POPLITEAL OCCLUSION WITH BALLOON ANGIOPLASTY OF POPLITEAL ARTERY REVASCULARIZATION OF LEFT ANTERIOR TIBIAL ARTERY WITH BALLOON ANGIOPLASTY REVASCULARIZATION OF LEFT TIBIOPERONEAL TRUNK AND PERONEAL ARTERY WITH BALLOON ANGIOPLASTY FLUOROSCOPY TIME:  25 minutes 54 seconds MEDICATIONS AND MEDICAL HISTORY: 2 mg intra-arterial tPA, 800  mcg nitroglycerin, 2.5 mg verapamil intra arterial ANESTHESIA/SEDATION: 2.5 mg Versed, 125 mcg fentanyl 180 minutes sedation time CONTRAST:  VISIPAQUE IODIXANOL 320 MG/ML IV SOLN COMPLICATIONS: Small puncture site hematoma at the left thigh.  SIR category 1. PROCEDURE: The procedure, risks, benefits, and alternatives were explained to the patient and the patient's family. Specific risks that were addressed included bleeding, infection, contrast reaction, kidney injury, need for further procedure, limb loss, cardiopulmonary collapse, death. Questions regarding the procedure were encouraged and answered. The patient understands and consents to the procedure. Proximal left thigh was prepped and draped in the usual sterile fashion. The left ankle was prepped and draped in the usual sterile fashion. Ultrasound survey of the left proximal thigh was performed with images stored and sent to PACs. A  micropuncture needle was used access the left superficial femoral artery under ultrasound in antegrade fashion. With excellent arterial blood flow returned, and an .018 micro wire was passed through the needle, observed to enter the SFA under fluoroscopy. The needle was removed, and a micropuncture sheath was placed over the wire. The inner dilator and wire were removed, and an 035 Bentson wire was advanced under fluoroscopy. The sheath was removed and a standard 5 [place] vascular sheath was placed. The dilator was removed and the sheath was flushed. Angiogram was performed of the left lower extremity. Ultrasound survey of the left dorsalis pedis was performed with images stored and sent to PACs. A micropuncture needle was used to access the left dorsalis pedis artery under ultrasound. With arterial blood flow returned, and an .018 micro wire was passed through the needle, observed to enter the distal anterior tibial artery under fluoroscopy. The needle was removed, and a micropuncture sheath was placed over the wire. The  inner dilator and wire were removed. A pharmacologic cocktail was infused. Stiff Glidewire was then passed through the sheath in a retrograde fashion to the anterior tibial artery, traversing the occlusion at the proximal anterior tibial artery and at the popliteal artery. The wire stayed luminal in the superficial femoral artery. The SFA sheath was then upsized to 6 [place]. A snare was used to snare the Glidewire from above. 035 quick cross catheter was then passed over the Glidewire to the distal anterior tibial artery. Down size to 018 system. Balloon angioplasty was then performed along the length of the anterior tibial artery with 2.0 mm diameter balloon, 2.5 mm diameter balloon, 3.0 mm diameter balloon. 2 mm balloon was used for hemostasis at the puncture site. Antegrade approach to the stump of the tibioperoneal trunk was used with a combination of Glidewire and quick cross catheter. Balloon angioplasty was performed with 2.5 mm diameter balloon and 3.0 mm diameter balloon at the origin, with restoration of flow. Spasm was induced in the mid and distal peroneal artery. Pedal pulse was confirmed at the conclusion of the case, which was not present at the initiation. All catheters and wires were removed. Sheath was removed from the superficial femoral artery, with manual pressure used for hemostasis. Patient tolerated the procedure well and remained hemodynamically stable throughout. No complications were encountered and no significant blood loss was encountered. FINDINGS: No palpable pulse at the initiation of the case. Doppler signals were present. Initial angiogram demonstrates no significant disease of the superficial femoral artery. Occlusion of the popliteal artery at the joint space of the knee. Occlusion extends through the popliteal artery, tibioperoneal trunk, and proximal anterior tibial artery. Reconstitution of the distal anterior tibial artery and of the peroneal artery. No posterior tibial  filling was present. After revascularization, there is in-line flow through the popliteal artery, anterior tibial artery, and peroneal artery to the ankle. Spasm induced in the mid and distal peroneal artery, with palpable pulse at the completion of the angiogram/treatment. IMPRESSION: Status post left lower extremity angiogram with antegrade and retrograde approach to popliteal occlusion and revascularization with plain balloon angioplasty restoring in-line flow through the popliteal artery, peroneal artery and anterior tibial artery to the ankle. Signed, Yvone Neu. Loreta Ave DO [facility] Radiology PLAN: p.o. Plavix 300 mg now. Left leg straight for 5 hours. Initiate 75 mg p.o. Plavix daily. Electronically Signed   By: Gilmer Mor D.O.   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 She has a vague history of what sounds like embolism to the popliteal artery and surgical thrombectomy in [place] many years prior. Prior inpatient noninvasive study demonstrates abnormal waveforms distally, with nonpalpable pulses of the foot. CT angiogram and runoff confirms a popliteal occlusion as well as tibial occlusions. She presents today for angiogram and attempt at revascularization for wound healing capability. EXAM: ULTRASOUND GUIDED ACCESS LEFT SUPERFICIAL FEMORAL ARTERY ULTRASOUND GUIDED ACCESS LEFT DORSALIS PEDIS REVASCULARIZATION OF POPLITEAL OCCLUSION WITH BALLOON ANGIOPLASTY OF POPLITEAL ARTERY REVASCULARIZATION OF LEFT ANTERIOR TIBIAL ARTERY WITH BALLOON ANGIOPLASTY REVASCULARIZATION OF LEFT TIBIOPERONEAL TRUNK AND PERONEAL ARTERY WITH BALLOON ANGIOPLASTY FLUOROSCOPY TIME:  25 minutes 54 seconds MEDICATIONS AND MEDICAL HISTORY: 2 mg intra-arterial tPA, 800 mcg nitroglycerin, 2.5 mg verapamil intra arterial ANESTHESIA/SEDATION: 2.5 mg Versed,  125 mcg fentanyl 180 minutes sedation time CONTRAST:  VISIPAQUE IODIXANOL 320 MG/ML IV SOLN COMPLICATIONS: Small puncture site hematoma at the left thigh.  SIR category 1. PROCEDURE: The procedure, risks, benefits, and alternatives were explained to the patient and the patient's family. Specific risks that were addressed included bleeding, infection, contrast reaction, kidney injury, need for further procedure, limb loss, cardiopulmonary collapse, death. Questions regarding the procedure were encouraged and answered. The patient understands and consents to the procedure. Proximal left thigh was prepped and draped in the usual sterile fashion. The left ankle was prepped and draped in the usual sterile fashion. Ultrasound survey of the left proximal thigh was performed with images stored and sent to PACs. A micropuncture needle was used access the left superficial femoral artery under ultrasound in antegrade fashion. With excellent arterial blood flow returned, and an .018 micro wire was passed through the needle, observed to enter the SFA under fluoroscopy. The needle was removed, and a micropuncture sheath was placed over the wire. The inner dilator and wire were removed, and an 035 Bentson wire was advanced under fluoroscopy. The sheath was removed and a standard 5 [place] vascular sheath was placed. The dilator was removed and the sheath was flushed. Angiogram was performed of the left lower extremity. Ultrasound survey of the left dorsalis pedis was performed with images stored and sent to PACs. A micropuncture needle was used to access the left dorsalis pedis artery under ultrasound. With arterial blood flow returned, and an .018 micro wire was passed through the needle, observed to enter the distal anterior tibial artery under fluoroscopy. The needle was removed, and a micropuncture sheath was placed over the wire. The inner dilator and wire were removed. A pharmacologic cocktail was infused. Stiff  Glidewire was then passed through the sheath in a retrograde fashion to the anterior tibial artery, traversing the occlusion at the proximal anterior tibial artery and at the popliteal artery. The wire stayed luminal in the superficial femoral artery. The SFA sheath was then upsized to 6 [place]. A snare was used to snare the Glidewire from above. 035 quick cross catheter was then passed over the Glidewire to the distal anterior tibial artery. Down size to 018 system. Balloon angioplasty was then performed along the length of the anterior tibial artery with 2.0 mm diameter balloon, 2.5 mm diameter balloon, 3.0 mm diameter balloon. 2 mm balloon was used for hemostasis at the puncture site. Antegrade approach to the stump of the tibioperoneal trunk was used with a combination of Glidewire and quick cross catheter. Balloon angioplasty was performed with 2.5 mm diameter balloon  and 3.0 mm diameter balloon at the origin, with restoration of flow. Spasm was induced in the mid and distal peroneal artery. Pedal pulse was confirmed at the conclusion of the case, which was not present at the initiation. All catheters and wires were removed. Sheath was removed from the superficial femoral artery, with manual pressure used for hemostasis. Patient tolerated the procedure well and remained hemodynamically stable throughout. No complications were encountered and no significant blood loss was encountered. FINDINGS: No palpable pulse at the initiation of the case. Doppler signals were present. Initial angiogram demonstrates no significant disease of the superficial femoral artery. Occlusion of the popliteal artery at the joint space of the knee. Occlusion extends through the popliteal artery, tibioperoneal trunk, and proximal anterior tibial artery. Reconstitution of the distal anterior tibial artery and of the peroneal artery. No posterior tibial filling was present. After revascularization, there is in-line flow through the  popliteal artery, anterior tibial artery, and peroneal artery to the ankle. Spasm induced in the mid and distal peroneal artery, with palpable pulse at the completion of the angiogram/treatment. IMPRESSION: Status post left lower extremity angiogram with antegrade and retrograde approach to popliteal occlusion and revascularization with plain balloon angioplasty restoring in-line flow through the popliteal artery, peroneal artery and anterior tibial artery to the ankle. Signed, Yvone Neu. Loreta Ave DO [facility] Radiology PLAN: p.o. Plavix 300 mg now. Left leg straight for 5 hours. Initiate 75 mg p.o. Plavix daily. Electronically Signed   By: Gilmer Mor D.O.   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 She has a vague history of what sounds like embolism to the popliteal artery and surgical thrombectomy in [place] many years prior. Prior inpatient noninvasive study demonstrates abnormal waveforms distally, with nonpalpable pulses of the foot. CT angiogram and runoff confirms a popliteal occlusion as well as tibial occlusions. She presents today for angiogram and attempt at revascularization for wound healing capability. EXAM: ULTRASOUND GUIDED ACCESS LEFT SUPERFICIAL FEMORAL ARTERY ULTRASOUND GUIDED ACCESS LEFT DORSALIS PEDIS REVASCULARIZATION OF POPLITEAL OCCLUSION WITH BALLOON ANGIOPLASTY OF POPLITEAL ARTERY REVASCULARIZATION OF LEFT ANTERIOR TIBIAL ARTERY WITH BALLOON ANGIOPLASTY REVASCULARIZATION OF LEFT TIBIOPERONEAL TRUNK AND PERONEAL ARTERY WITH BALLOON ANGIOPLASTY FLUOROSCOPY TIME:  25 minutes 54 seconds MEDICATIONS AND MEDICAL HISTORY: 2 mg intra-arterial tPA, 800 mcg nitroglycerin, 2.5 mg verapamil intra arterial ANESTHESIA/SEDATION: 2.5 mg Versed, 125 mcg fentanyl 180 minutes sedation time CONTRAST:   VISIPAQUE IODIXANOL 320 MG/ML IV SOLN COMPLICATIONS: Small puncture site hematoma at the left thigh.  SIR category 1. PROCEDURE: The procedure, risks, benefits, and alternatives were explained to the patient and the patient's family. Specific risks that were addressed included bleeding, infection, contrast reaction, kidney injury, need for further procedure, limb loss, cardiopulmonary collapse, death. Questions regarding the procedure were encouraged and answered. The patient understands and consents to the procedure. Proximal left thigh was prepped and draped in the usual sterile fashion. The left ankle was prepped and draped in the usual sterile fashion. Ultrasound survey of the left proximal thigh was performed with images stored and sent to PACs. A micropuncture needle was used access the left superficial femoral artery under ultrasound in antegrade fashion. With excellent arterial blood flow returned, and an .018 micro wire was passed through the needle, observed to enter the SFA under fluoroscopy. The needle was removed, and a  micropuncture sheath was placed over the wire. The inner dilator and wire were removed, and an 035 Bentson wire was advanced under fluoroscopy. The sheath was removed and a standard 5 [place] vascular sheath was placed. The dilator was removed and the sheath was flushed. Angiogram was performed of the left lower extremity. Ultrasound survey of the left dorsalis pedis was performed with images stored and sent to PACs. A micropuncture needle was used to access the left dorsalis pedis artery under ultrasound. With arterial blood flow returned, and an .018 micro wire was passed through the needle, observed to enter the distal anterior tibial artery under fluoroscopy. The needle was removed, and a micropuncture sheath was placed over the wire. The inner dilator and wire were removed. A pharmacologic cocktail was infused. Stiff Glidewire was then passed through the sheath in a retrograde  fashion to the anterior tibial artery, traversing the occlusion at the proximal anterior tibial artery and at the popliteal artery. The wire stayed luminal in the superficial femoral artery. The SFA sheath was then upsized to 6 [place]. A snare was used to snare the Glidewire from above. 035 quick cross catheter was then passed over the Glidewire to the distal anterior tibial artery. Down size to 018 system. Balloon angioplasty was then performed along the length of the anterior tibial artery with 2.0 mm diameter balloon, 2.5 mm diameter balloon, 3.0 mm diameter balloon. 2 mm balloon was used for hemostasis at the puncture site. Antegrade approach to the stump of the tibioperoneal trunk was used with a combination of Glidewire and quick cross catheter. Balloon angioplasty was performed with 2.5 mm diameter balloon and 3.0 mm diameter balloon at the origin, with restoration of flow. Spasm was induced in the mid and distal peroneal artery. Pedal pulse was confirmed at the conclusion of the case, which was not present at the initiation. All catheters and wires were removed. Sheath was removed from the superficial femoral artery, with manual pressure used for hemostasis. Patient tolerated the procedure well and remained hemodynamically stable throughout. No complications were encountered and no significant blood loss was encountered. FINDINGS: No palpable pulse at the initiation of the case. Doppler signals were present. Initial angiogram demonstrates no significant disease of the superficial femoral artery. Occlusion of the popliteal artery at the joint space of the knee. Occlusion extends through the popliteal artery, tibioperoneal trunk, and proximal anterior tibial artery. Reconstitution of the distal anterior tibial artery and of the peroneal artery. No posterior tibial filling was present. After revascularization, there is in-line flow through the popliteal artery, anterior tibial artery, and peroneal artery to the  ankle. Spasm induced in the mid and distal peroneal artery, with palpable pulse at the completion of the angiogram/treatment. IMPRESSION: Status post left lower extremity angiogram with antegrade and retrograde approach to popliteal occlusion and revascularization with plain balloon angioplasty restoring in-line flow through the popliteal artery, peroneal artery and anterior tibial artery to the ankle. Signed, Yvone Neu. Loreta Ave DO [facility] Radiology PLAN: p.o. Plavix 300 mg now. Left leg straight for 5 hours. Initiate 75 mg p.o. Plavix daily. Electronically Signed   By: Gilmer Mor D.O.   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 She has a vague history of what sounds like embolism to the popliteal artery and surgical thrombectomy in [place] many years prior. Prior inpatient noninvasive  study demonstrates abnormal waveforms distally, with nonpalpable pulses of the foot. CT angiogram and runoff confirms a popliteal occlusion as well as tibial occlusions. She presents today for angiogram and attempt at revascularization for wound healing capability. EXAM: ULTRASOUND GUIDED ACCESS LEFT SUPERFICIAL FEMORAL ARTERY ULTRASOUND GUIDED ACCESS LEFT DORSALIS PEDIS REVASCULARIZATION OF POPLITEAL OCCLUSION WITH BALLOON ANGIOPLASTY OF POPLITEAL ARTERY REVASCULARIZATION OF LEFT ANTERIOR TIBIAL ARTERY WITH BALLOON ANGIOPLASTY REVASCULARIZATION OF LEFT TIBIOPERONEAL TRUNK AND PERONEAL ARTERY WITH BALLOON ANGIOPLASTY FLUOROSCOPY TIME:  25 minutes 54 seconds MEDICATIONS AND MEDICAL HISTORY: 2 mg intra-arterial tPA, 800 mcg nitroglycerin, 2.5 mg verapamil intra arterial ANESTHESIA/SEDATION: 2.5 mg Versed, 125 mcg fentanyl 180 minutes sedation time CONTRAST:  VISIPAQUE IODIXANOL 320 MG/ML IV SOLN COMPLICATIONS: Small puncture site hematoma at the  left thigh.  SIR category 1. PROCEDURE: The procedure, risks, benefits, and alternatives were explained to the patient and the patient's family. Specific risks that were addressed included bleeding, infection, contrast reaction, kidney injury, need for further procedure, limb loss, cardiopulmonary collapse, death. Questions regarding the procedure were encouraged and answered. The patient understands and consents to the procedure. Proximal left thigh was prepped and draped in the usual sterile fashion. The left ankle was prepped and draped in the usual sterile fashion. Ultrasound survey of the left proximal thigh was performed with images stored and sent to PACs. A micropuncture needle was used access the left superficial femoral artery under ultrasound in antegrade fashion. With excellent arterial blood flow returned, and an .018 micro wire was passed through the needle, observed to enter the SFA under fluoroscopy. The needle was removed, and a micropuncture sheath was placed over the wire. The inner dilator and wire were removed, and an 035 Bentson wire was advanced under fluoroscopy. The sheath was removed and a standard 5 [place] vascular sheath was placed. The dilator was removed and the sheath was flushed. Angiogram was performed of the left lower extremity. Ultrasound survey of the left dorsalis pedis was performed with images stored and sent to PACs. A micropuncture needle was used to access the left dorsalis pedis artery under ultrasound. With arterial blood flow returned, and an .018 micro wire was passed through the needle, observed to enter the distal anterior tibial artery under fluoroscopy. The needle was removed, and a micropuncture sheath was placed over the wire. The inner dilator and wire were removed. A pharmacologic cocktail was infused. Stiff Glidewire was then passed through the sheath in a retrograde fashion to the anterior tibial artery, traversing the occlusion at the proximal anterior tibial  artery and at the popliteal artery. The wire stayed luminal in the superficial femoral artery. The SFA sheath was then upsized to 6 [place]. A snare was used to snare the Glidewire from above. 035 quick cross catheter was then passed over the Glidewire to the distal anterior tibial artery. Down size to 018 system. Balloon angioplasty was then performed along the length of the anterior tibial artery with 2.0 mm diameter balloon, 2.5 mm diameter balloon, 3.0 mm diameter balloon. 2 mm balloon was used for hemostasis at the puncture site. Antegrade approach to the stump of the tibioperoneal trunk was used with a combination of Glidewire and quick cross catheter. Balloon angioplasty was performed with 2.5 mm diameter balloon and 3.0 mm diameter balloon at the origin, with restoration of flow. Spasm was induced in the mid and distal peroneal artery. Pedal pulse was confirmed at the conclusion of the case, which was not present at the initiation. All catheters and wires were  removed. Sheath was removed from the superficial femoral artery, with manual pressure used for hemostasis. Patient tolerated the procedure well and remained hemodynamically stable throughout. No complications were encountered and no significant blood loss was encountered. FINDINGS: No palpable pulse at the initiation of the case. Doppler signals were present. Initial angiogram demonstrates no significant disease of the superficial femoral artery. Occlusion of the popliteal artery at the joint space of the knee. Occlusion extends through the popliteal artery, tibioperoneal trunk, and proximal anterior tibial artery. Reconstitution of the distal anterior tibial artery and of the peroneal artery. No posterior tibial filling was present. After revascularization, there is in-line flow through the popliteal artery, anterior tibial artery, and peroneal artery to the ankle. Spasm induced in the mid and distal peroneal artery, with palpable pulse at the  completion of the angiogram/treatment. IMPRESSION: Status post left lower extremity angiogram with antegrade and retrograde approach to popliteal occlusion and revascularization with plain balloon angioplasty restoring in-line flow through the popliteal artery, peroneal artery and anterior tibial artery to the ankle. Signed, Yvone Neu. Loreta Ave DO [facility] Radiology PLAN: p.o. Plavix 300 mg now. Left leg straight for 5 hours. Initiate 75 mg p.o. Plavix daily. Electronically Signed   By: Gilmer Mor D.O.   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 She has a vague history of what sounds like embolism to the popliteal artery and surgical thrombectomy in [place] many years prior. Prior inpatient noninvasive study demonstrates abnormal waveforms distally, with nonpalpable pulses of the foot. CT angiogram and runoff confirms a popliteal occlusion as well as tibial occlusions. She presents today for angiogram and attempt at revascularization for wound healing capability. EXAM: ULTRASOUND GUIDED ACCESS LEFT SUPERFICIAL FEMORAL ARTERY ULTRASOUND GUIDED ACCESS LEFT DORSALIS PEDIS REVASCULARIZATION OF POPLITEAL OCCLUSION WITH BALLOON ANGIOPLASTY OF POPLITEAL ARTERY REVASCULARIZATION OF LEFT ANTERIOR TIBIAL ARTERY WITH BALLOON ANGIOPLASTY REVASCULARIZATION OF LEFT TIBIOPERONEAL TRUNK AND PERONEAL ARTERY WITH BALLOON ANGIOPLASTY FLUOROSCOPY TIME:  25 minutes 54 seconds MEDICATIONS AND MEDICAL HISTORY: 2 mg intra-arterial tPA, 800 mcg nitroglycerin, 2.5 mg verapamil intra arterial ANESTHESIA/SEDATION: 2.5 mg Versed, 125 mcg fentanyl 180 minutes sedation time CONTRAST:  VISIPAQUE IODIXANOL 320 MG/ML IV SOLN COMPLICATIONS: Small puncture site hematoma at the left thigh.  SIR category 1. PROCEDURE: The procedure, risks, benefits, and  alternatives were explained to the patient and the patient's family. Specific risks that were addressed included bleeding, infection, contrast reaction, kidney injury, need for further procedure, limb loss, cardiopulmonary collapse, death. Questions regarding the procedure were encouraged and answered. The patient understands and consents to the procedure. Proximal left thigh was prepped and draped in the usual sterile fashion. The left ankle was prepped and draped in the usual sterile fashion. Ultrasound survey of the left proximal thigh was performed with images stored and sent to PACs. A micropuncture needle was used access the left superficial femoral artery under ultrasound in antegrade fashion. With excellent arterial blood flow returned, and an .018 micro wire was passed through the needle, observed to enter the SFA under fluoroscopy. The needle was removed, and a micropuncture sheath was placed over the wire. The inner dilator and wire were removed, and an 035 Bentson wire was advanced under fluoroscopy. The sheath was removed and a standard 5 [place] vascular sheath was placed. The dilator was removed and the sheath was flushed. Angiogram was performed  of the left lower extremity. Ultrasound survey of the left dorsalis pedis was performed with images stored and sent to PACs. A micropuncture needle was used to access the left dorsalis pedis artery under ultrasound. With arterial blood flow returned, and an .018 micro wire was passed through the needle, observed to enter the distal anterior tibial artery under fluoroscopy. The needle was removed, and a micropuncture sheath was placed over the wire. The inner dilator and wire were removed. A pharmacologic cocktail was infused. Stiff Glidewire was then passed through the sheath in a retrograde fashion to the anterior tibial artery, traversing the occlusion at the proximal anterior tibial artery and at the popliteal artery. The wire stayed luminal in the  superficial femoral artery. The SFA sheath was then upsized to 6 [place]. A snare was used to snare the Glidewire from above. 035 quick cross catheter was then passed over the Glidewire to the distal anterior tibial artery. Down size to 018 system. Balloon angioplasty was then performed along the length of the anterior tibial artery with 2.0 mm diameter balloon, 2.5 mm diameter balloon, 3.0 mm diameter balloon. 2 mm balloon was used for hemostasis at the puncture site. Antegrade approach to the stump of the tibioperoneal trunk was used with a combination of Glidewire and quick cross catheter. Balloon angioplasty was performed with 2.5 mm diameter balloon and 3.0 mm diameter balloon at the origin, with restoration of flow. Spasm was induced in the mid and distal peroneal artery. Pedal pulse was confirmed at the conclusion of the case, which was not present at the initiation. All catheters and wires were removed. Sheath was removed from the superficial femoral artery, with manual pressure used for hemostasis. Patient tolerated the procedure well and remained hemodynamically stable throughout. No complications were encountered and no significant blood loss was encountered. FINDINGS: No palpable pulse at the initiation of the case. Doppler signals were present. Initial angiogram demonstrates no significant disease of the superficial femoral artery. Occlusion of the popliteal artery at the joint space of the knee. Occlusion extends through the popliteal artery, tibioperoneal trunk, and proximal anterior tibial artery. Reconstitution of the distal anterior tibial artery and of the peroneal artery. No posterior tibial filling was present. After revascularization, there is in-line flow through the popliteal artery, anterior tibial artery, and peroneal artery to the ankle. Spasm induced in the mid and distal peroneal artery, with palpable pulse at the completion of the angiogram/treatment. IMPRESSION: Status post left lower  extremity angiogram with antegrade and retrograde approach to popliteal occlusion and revascularization with plain balloon angioplasty restoring in-line flow through the popliteal artery, peroneal artery and anterior tibial artery to the ankle. Signed, Yvone Neu. Loreta Ave DO [facility] Radiology PLAN: p.o. Plavix 300 mg now. Left leg straight for 5 hours. Initiate 75 mg p.o. Plavix daily. Electronically Signed   By: Gilmer Mor D.O.   On: 05/09/2015 16:00    EKG: Independently reviewed.   Assessment/Plan Active Problems:   Syncope   Hypotension   Fall Anemia blood loss   1 Anemia Second to blood loss,    Hemoglobin was 10 on 1/12 pre procedure, Hemoglobin today, 1/13 is 7.9   Pt to be transfused two units of blood  We will monitor hemoglobin,  Interventinional radiology will evaluated wound  2 Syncope   This is probably second to blood loss,   Monitor,  Pt consult for ambulation  3Hypotension Tranfusion Continue IV fluids  4 Fall  Pt has skin tears  to arms,  Continue wound care   Radiology consulted and will see pt Critical care consulted   Code Status: no code DVT Prophylaxis: no active bleeding Family Communication: Disposition Plan   consult critical care  Time spent: 5  [facility] Pager [phone]

## 2015-05-10 NOTE — ED Notes (Signed)
Per Dr. Randol KernElgergawy infuse vancomycin over 2 hours due to pt having red man reaction on first dose. Pt denies any other symptoms other than red skin. Pt remains alert. No SOB or itching noted.

## 2015-05-10 NOTE — Progress Notes (Signed)
Pharmacy Code Sepsis Protocol  Time of code sepsis page: 1052 Time of antibiotic delivery: 1118   No code sepsis page sent out   Was it required to contact the physician? [x]  Physician not contacted []  Physician contacted to order antibiotics for code sepsis []  Physician contacted to recommend changing antibiotics  Pharmacy consulted for: Zosyn and vancomycin  Anti-infectives    Start     Dose/Rate Route Frequency Ordered Stop   05/10/15 1115  piperacillin-tazobactam (ZOSYN) IVPB 3.375 g     3.375 g 100 mL/hr over 30 Minutes Intravenous  Once 05/10/15 1112     05/10/15 1115  vancomycin (VANCOCIN) IVPB 1000 mg/200 mL premix     1,000 mg 200 mL/hr over 60 Minutes Intravenous  Once 05/10/15 1112        @infusions @   Nurse education provided: [x]  Minutes left to administer antibiotics to achieve 1 hour goal [x]  Correct order of antibiotic administration [x]  Antibiotic Y-site compatibilities     Enzo BiNathan Steffan Caniglia, PharmD, BCPS Clinical Pharmacist Pager [phone] 05/10/2015 11:20 AM

## 2015-05-10 NOTE — ED Notes (Signed)
Patient transported to X-ray 

## 2015-05-10 NOTE — ED Provider Notes (Signed)
CSN: [id number]     Arrival date & time 05/10/15  0103 History  By signing my name below, I, Kristina Diaz, attest that this documentation has been prepared under the direction and in the presence of Shon Baton, MD. Electronically Signed: Octavia Heir, ED Scribe. 05/10/2015. 1:47 AM.    Chief Complaint  Patient presents with  . Fall      The history is provided by the patient. No language interpreter was used.   HPI Comments: Kristina Diaz is a 82 y.o. female who has a hx of PAD, afib, HTN, DVT, presents to the Emergency Department complaining of a fall that occurred this evening. She endorses lower back spasm, neck pain, and 2 skin tears on her right upper forearm. Pt notes she got dizzy, lost her balance and fell to the ground, landing on her right side at her assisted living facility. She had an arteriogram done earlier today and states her neck pain came from laying down during the procedure. Family member states pt was shaking and slurring her speech which was unusual. Daughter states [place] was [place]" after the procedure. Unknown medications given during the procedure.Pt has been off of coumadin for 5 days but took her dosage of plavix this afternoon. Pt did not hit her head or lose consciousness. She denies chest pain and shortness of breath.  Of note, patient was noted to have hematoma formation during procedure yesterday. She had a  subsequent study to rule out pseudoaneurysm that was negative.  Baseline blood pressure 100 110 systolic. She was noted to be tachycardic prior to discharge yesterday into the 130s.  Past Medical History  Diagnosis Date  . PAD (peripheral artery disease) (HCC) 2013    left leg. sounds like she had angio-plasty   . Atrial fibrillation (HCC) 2013    s/p [facility] that failed. ON medication  . Hypertension   . Osteoporosis   . DVT (deep venous thrombosis) (HCC) 2013    "LLE" (08/04/2012)  . Hypothyroidism   . History of blood transfusion 2013     "related to thrombectomy" (08/04/2012)  . Anemia     "slightly" (08/04/2012)  . Arthritis     "probably minor; knees, pinky" (08/04/2012)  . Fracture of L4 vertebra (HCC)     "dx'd in 2013; can't treat til legs fixed" (08/04/2012)  . Lower extremity ulceration (HCC)   . Biventricular heart failure with reduced left ventricular function [facility])    Past Surgical History  Procedure Laterality Date  . Thrombectomy Left 2013    "leg" (08/04/2012)  . G2p2    . Tonsillectomy and adenoidectomy  ~ 1938  . Dilation and curettage of uterus  1960's   Family History  Problem Relation Age of Onset  . Arthritis Mother   . Cancer Mother     breast cancer   . Cancer Brother     lung cancer  . Tuberculosis Father   . Cancer Sister     stg 4 breast cancer  . Diabetes Neg Hx   . Heart disease Neg Hx   . COPD Neg Hx    Social History  Substance Use Topics  . Smoking status: Never Smoker   . Smokeless tobacco: Never Used  . Alcohol Use: 0.0 oz/week     Comment: 08/04/2012 "used to have 1-2 glasses of wine/wk; nothing for the past 1 yr"   OB History    No data available     Review of Systems  Constitutional:  Negative for fever.  Respiratory: Negative for shortness of breath.   Cardiovascular: Negative for chest pain.  Gastrointestinal: Negative for nausea, vomiting and abdominal pain.  Musculoskeletal: Positive for back pain and neck pain.  Skin: Positive for wound.  Neurological: Positive for dizziness and light-headedness.  All other systems reviewed and are negative.     Allergies  Bactrim ds; Contrast media; Ciprofloxacin; and Tape  Home Medications   Prior to Admission medications   Medication Sig Start Date End Date Taking? Authorizing Provider  cetirizine (ZYRTEC) 10 MG tablet Take 1 tablet (10 mg total) by mouth 2 (two) times daily. X 14 days 05/01/15  Yes Nelwyn Salisbury, MD  clopidogrel (PLAVIX) 75 MG tablet Take 1 tablet (75 mg total) by mouth daily. 05/09/15  Yes Gilmer Mor, DO  collagenase (SANTYL) ointment Apply 1 application topically every other day. To bilateral lower extremities wounds   Yes Historical Provider, MD  docusate sodium (COLACE) 100 MG capsule Take 100 mg by mouth daily.   Yes Historical Provider, MD  levothyroxine (SYNTHROID, LEVOTHROID) 25 MCG tablet TAKE 1 AND 1/2 TABS (37.5MG ) BY MOUTH EVERY DAY*CHECK PULSE WEEKLY* 07/14/12  Yes Jacques Navy, MD  metoprolol succinate (TOPROL-XL) 25 MG 24 hr tablet Take 0.5 tablets (12.5 mg total) by mouth daily. 11/01/12  Yes Jacques Navy, MD  Multiple Vitamin (MULTIVITAMIN WITH MINERALS) TABS Take 1 tablet by mouth every morning.   Yes Historical Provider, MD  oxyCODONE (OXYCONTIN) 10 mg 12 hr tablet Take 1 tablet (10 mg total) by mouth every 12 (twelve) hours. 05/01/15  Yes Gordy Savers, MD  potassium chloride (K-DUR,KLOR-CON) 10 MEQ tablet Take 1 tablet (10 mEq total) by mouth 2 (two) times daily. 08/11/12  Yes Hadassah Pais, PA-C  predniSONE (DELTASONE) 50 MG tablet Take 50 mg by mouth once.   Yes Historical Provider, MD  silver sulfADIAZINE (SILVADENE) 1 % cream Apply 1 application topically 2 (two) times daily. Apply to wounds 1-2 times daily as directed.   Yes Historical Provider, MD  torsemide (DEMADEX) 20 MG tablet TAKE 1 TAB BY MOUTH EVERY DAY -- DX: EDEMA AND TAKE 1 TAB BY MOUTH AT 5PM ON MON WED FRI (THIS IS.IN ADDITION TO MORNING SCHEDULE DOSE) -... Patient taking differently: Take 1 tablet by mouth every morning at 8 am in addition to toresemide 20mg  three times weekly 04/24/14  Yes Laurey Morale, MD  torsemide [facility]) 20 MG tablet Take 20 mg by mouth every Monday, Wednesday, and Friday. At 5 pm in addition to daily torsemide 20mg    Yes Historical Provider, MD  nystatin (MYCOSTATIN) powder Apply topically 4 (four) times daily. Patient not taking: Reported on 05/10/2015 07/08/12   Jacques Navy, MD   Triage vitals: BP 79/39 mmHg  Pulse 116  Temp(Src) 98.4 F (36.9 C) (Oral)   Resp 16  SpO2 100% Physical Exam  Constitutional: She is oriented to person, place, and time. No distress.  Elderly, no acute distress  HENT:  Head: Normocephalic and atraumatic.  Eyes: Pupils are equal, round, and reactive to light.  Neck:  C collar in place  Cardiovascular: Regular rhythm and normal heart sounds.   tachycardia  Pulmonary/Chest: Effort normal. No respiratory distress. She has no wheezes.  Abdominal: Soft. Bowel sounds are normal. There is no tenderness. There is no rebound.  Musculoskeletal:  Chronic wounds in bilateral lower extremities left greater than right, access site for arteriogram clean dry and intact,mild bruising and hematoma noted, no  significant  swelling of the thigh  Neurological: She is alert and oriented to person, place, and time.   Out of 5 strength in all 4 extremities, fluent speech, cranial nerves II through XII intact.  Skin: Skin is warm and dry.  Psychiatric: She has a normal mood and affect.  Nursing note and vitals reviewed.  Skin tear to right upper forearm ED Course  Procedures   CRITICAL CARE Performed by: Shon Baton   Total critical care time: 45 minutes  Critical care time was exclusive of separately billable procedures and treating other patients.  Critical care was necessary to treat or prevent imminent or life-threatening deterioration.  Critical care was time spent personally by me on the following activities: development of treatment plan with patient and/or surrogate as well as nursing, discussions with consultants, evaluation of patient's response to treatment, examination of patient, obtaining history from patient or surrogate, ordering and performing treatments and interventions, ordering and review of laboratory studies, ordering and review of radiographic studies, pulse oximetry and re-evaluation of patient's condition.  DIAGNOSTIC STUDIES: Oxygen Saturation is 100% on RA, normal by my  interpretation.  COORDINATION OF CARE:  1:43 AM Discussed treatment plan which includes CT cervical spine, DG lumbar spine, CT head no contrast, and lab work with pt at bedside and pt agreed to plan.  Labs Review Labs Reviewed  PROTIME-INR - Abnormal; Notable for the following:    Prothrombin Time 19.3 (*)    INR 1.62 (*)    All other components within normal limits  CBC WITH DIFFERENTIAL/PLATELET - Abnormal; Notable for the following:    WBC 17.8 (*)    RBC 2.70 (*)    Hemoglobin 7.9 (*)    HCT 24.7 (*)    RDW 15.8 (*)    Neutro Abs 17.1 (*)    Lymphs Abs 0.5 (*)    All other components within normal limits  COMPREHENSIVE METABOLIC PANEL - Abnormal; Notable for the following:    BUN 28 (*)    Creatinine, Ser 1.83 (*)    Calcium 8.3 (*)    Total Protein 5.6 (*)    Albumin 2.2 (*)    AST 74 (*)    GFR calc non Af Amer 24 (*)    GFR calc Af Amer 28 (*)    Anion gap 16 (*)    All other components within normal limits  URINALYSIS, ROUTINE W REFLEX MICROSCOPIC (NOT AT [facility]) - Abnormal; Notable for the following:    APPearance CLOUDY (*)    Hgb urine dipstick LARGE (*)    All other components within normal limits  URINE MICROSCOPIC-ADD ON - Abnormal; Notable for the following:    Squamous Epithelial / LPF 0-5 (*)    Bacteria, UA RARE (*)    Casts HYALINE CASTS (*)    All other components within normal limits  I-STAT CG4 LACTIC ACID, ED - Abnormal; Notable for the following:    Lactic Acid, Venous 2.59 (*)    All other components within normal limits  I-STAT CG4 LACTIC ACID, ED    Imaging Review Ct Abdomen Pelvis Wo Contrast  05/10/2015  CLINICAL DATA:  82 year old female with severe back pain concern for retroperitoneal hemorrhage. EXAM: CT ABDOMEN AND PELVIS WITHOUT CONTRAST TECHNIQUE: Multidetector CT imaging of the abdomen and pelvis was performed following the standard protocol without IV contrast. COMPARISON:  Radiograph dated 05/10/2015 and CT dated 04/25/2015  FINDINGS: Evaluation of this exam is limited in the absence of intravenous contrast. Evaluation is also  limited due to streak artifact caused by patient's arms. Mild bibasilar linear atelectatic changes. There is cardiomegaly. There is coronary vascular calcification. No intra-abdominal free air. No free fluid. The liver appears unremarkable. High attenuating material within the gallbladder may represent vicariously excreted contrast from recent study or small gallstones. The pancreas is not well visualized. A 1.6 cm stable appearing hypodensity is again noted in the region of the head of the pancreas similar to the prior study. The spleen appears unremarkable. The adrenal glands are not well visualized. There is stable appearing low positioning and malrotation of the right kidney. There is no hydronephrosis on either side. There is excreted contrast in the right renal pelvis and within the urinary bladder. The uterus is grossly unremarkable. There is apparent thickening of the wall of the stomach which may be related to underdistention. Gastritis is not excluded. Clinical correlation is recommended. Constipation. Dense stool within the rectum may represent a degree of fecal impaction. There is no evidence of bowel obstruction. There is atherosclerotic calcification of the aorta. No portal venous gas identified. There is no lymphadenopathy. No definite intraperitoneal or retroperitoneal hemorrhage noted. There is diffuse subcutaneous soft tissue stranding. Dilated and serpiginous collateral vasculature noted in the subcutaneous soft tissue of the right anterior abdominal wall. There is diffuse stranding of the subcutaneous tissues of the gluteal and perineal region. No fluid collection or hematoma identified. There is osteopenia. Degenerative changes of the spine. There is L2 compression fracture with near complete loss of vertebral body height and anterior wedging similar to prior study. No acute fracture.  IMPRESSION: No definite retroperitoneal or intraperitoneal hematoma identified. No hematoma seen in the visualized portions of the lower extremities. Electronically Signed   By: Elgie Collard M.D.   On: 05/10/2015 06:10   Dg Chest 2 View  05/10/2015  CLINICAL DATA:  82 year old female with hypotension and heart failure. EXAM: CHEST  2 VIEW COMPARISON:  Chest radiograph dated 01/23/2015 FINDINGS: Two views of the chest demonstrate stable cardiomegaly. No focal consolidation, pleural effusion, or pneumothorax. The osseous structures are grossly unremarkable. IMPRESSION: Stable cardiomegaly.  No acute cardiopulmonary process. Electronically Signed   By: Elgie Collard M.D.   On: 05/10/2015 06:19   Dg Lumbar Spine Complete  05/10/2015  CLINICAL DATA:  Fall, diffuse pain. EXAM: LUMBAR SPINE - COMPLETE 4+ VIEW COMPARISON:  Reformats from CT lower extremity angiogram 04/25/2015 FINDINGS: Severe compression deformity of L2 is unchanged from prior exam. There is otherwise no acute fracture. Mild anterolisthesis of L5 on S1 is unchanged from CT. Multilevel facet arthropathy is seen. Significant osteoporosis. Excreted contrast within kidneys and urinary bladder from angiogram yesterday. IMPRESSION: Chronic compression deformity of L3. No definite acute fracture. Severe osteoporosis. Electronically Signed   By: Rubye Oaks M.D.   On: 05/10/2015 03:32   Ct Head Wo Contrast  05/10/2015  CLINICAL DATA:  Lost her balance, fall to ground. Neck pain. Slurred speech. EXAM: CT HEAD WITHOUT CONTRAST CT CERVICAL SPINE WITHOUT CONTRAST TECHNIQUE: Multidetector CT imaging of the head and cervical spine was performed following the standard protocol without intravenous contrast. Multiplanar CT image reconstructions of the cervical spine were also generated. COMPARISON:  None. FINDINGS: CT HEAD FINDINGS No intracranial hemorrhage, mass effect, or midline shift. Generalized atrophy and chronic small vessel ischemia. No  hydrocephalus. Portions of the skullbase are obscured by streak artifact from metallic dentures/dental hardware. The basilar cisterns are patent. No evidence of territorial infarct. No intracranial fluid collection. Calvarium is intact. Right maxillary sinuses moderate  of, no paranasal sinus inflammatory change. Mild sclerosis but inferior left mastoid air cells. CT CERVICAL SPINE FINDINGS Exaggerated cervical lordosis. Diffuse osteoporosis. Vertebral body heights are preserved. There is no fracture. The dens is intact. There are no jumped or perched facets. Disc space narrowing at C5-C6. Mild multilevel facet arthropathy. No prevertebral soft tissue edema. The esophagus appears dilated were visualized. IMPRESSION: 1. Atrophy and chronic small vessel ischemia without acute intracranial abnormality. 2. Degenerative change in osteoporosis in the cervical spine. No acute fracture or subluxation. Electronically Signed   By: Rubye Oaks M.D.   On: 05/10/2015 03:50   Ct Cervical Spine Wo Contrast  05/10/2015  CLINICAL DATA:  Lost her balance, fall to ground. Neck pain. Slurred speech. EXAM: CT HEAD WITHOUT CONTRAST CT CERVICAL SPINE WITHOUT CONTRAST TECHNIQUE: Multidetector CT imaging of the head and cervical spine was performed following the standard protocol without intravenous contrast. Multiplanar CT image reconstructions of the cervical spine were also generated. COMPARISON:  None. FINDINGS: CT HEAD FINDINGS No intracranial hemorrhage, mass effect, or midline shift. Generalized atrophy and chronic small vessel ischemia. No hydrocephalus. Portions of the skullbase are obscured by streak artifact from metallic dentures/dental hardware. The basilar cisterns are patent. No evidence of territorial infarct. No intracranial fluid collection. Calvarium is intact. Right maxillary sinuses moderate of, no paranasal sinus inflammatory change. Mild sclerosis but inferior left mastoid air cells. CT CERVICAL SPINE FINDINGS  Exaggerated cervical lordosis. Diffuse osteoporosis. Vertebral body heights are preserved. There is no fracture. The dens is intact. There are no jumped or perched facets. Disc space narrowing at C5-C6. Mild multilevel facet arthropathy. No prevertebral soft tissue edema. The esophagus appears dilated were visualized. IMPRESSION: 1. Atrophy and chronic small vessel ischemia without acute intracranial abnormality. 2. Degenerative change in osteoporosis in the cervical spine. No acute fracture or subluxation. Electronically Signed   By: Rubye Oaks M.D.   On: 05/10/2015 03:50   Ir Angiogram Extremity Left  05/09/2015  CLINICAL DATA:  82 year old female with a history of chronic nonhealing wound of the left lower extremity. She has a vague history of what sounds like embolism to the popliteal artery and surgical thrombectomy in [place] many years prior. Prior inpatient noninvasive study demonstrates abnormal waveforms distally, with nonpalpable pulses of the foot. CT angiogram and runoff confirms a popliteal occlusion as well as tibial occlusions. She presents today for angiogram and attempt at revascularization for wound healing capability. EXAM: ULTRASOUND GUIDED ACCESS LEFT SUPERFICIAL FEMORAL ARTERY ULTRASOUND GUIDED ACCESS LEFT DORSALIS PEDIS REVASCULARIZATION OF POPLITEAL OCCLUSION WITH BALLOON ANGIOPLASTY OF POPLITEAL ARTERY REVASCULARIZATION OF LEFT ANTERIOR TIBIAL ARTERY WITH BALLOON ANGIOPLASTY REVASCULARIZATION OF LEFT TIBIOPERONEAL TRUNK AND PERONEAL ARTERY WITH BALLOON ANGIOPLASTY FLUOROSCOPY TIME:  25 minutes 54 seconds MEDICATIONS AND MEDICAL HISTORY: 2 mg intra-arterial tPA, 800 mcg nitroglycerin, 2.5 mg verapamil intra arterial ANESTHESIA/SEDATION: 2.5 mg Versed, 125 mcg fentanyl 180 minutes sedation time CONTRAST:  VISIPAQUE IODIXANOL 320 MG/ML IV SOLN COMPLICATIONS: Small puncture site hematoma at the left thigh.  SIR category 1. PROCEDURE: The procedure, risks, benefits, and  alternatives were explained to the patient and the patient's family. Specific risks that were addressed included bleeding, infection, contrast reaction, kidney injury, need for further procedure, limb loss, cardiopulmonary collapse, death. Questions regarding the procedure were encouraged and answered. The patient understands and consents to the procedure. Proximal left thigh was prepped and draped in the usual sterile fashion. The left ankle was prepped and draped in the usual sterile fashion. Ultrasound survey of the left proximal  thigh was performed with images stored and sent to PACs. A micropuncture needle was used access the left superficial femoral artery under ultrasound in antegrade fashion. With excellent arterial blood flow returned, and an .018 micro wire was passed through the needle, observed to enter the SFA under fluoroscopy. The needle was removed, and a micropuncture sheath was placed over the wire. The inner dilator and wire were removed, and an 035 Bentson wire was advanced under fluoroscopy. The sheath was removed and a standard 5 [place] vascular sheath was placed. The dilator was removed and the sheath was flushed. Angiogram was performed of the left lower extremity. Ultrasound survey of the left dorsalis pedis was performed with images stored and sent to PACs. A micropuncture needle was used to access the left dorsalis pedis artery under ultrasound. With arterial blood flow returned, and an .018 micro wire was passed through the needle, observed to enter the distal anterior tibial artery under fluoroscopy. The needle was removed, and a micropuncture sheath was placed over the wire. The inner dilator and wire were removed. A pharmacologic cocktail was infused. Stiff Glidewire was then passed through the sheath in a retrograde fashion to the anterior tibial artery, traversing the occlusion at the proximal anterior tibial artery and at the popliteal artery. The wire stayed luminal in the  superficial femoral artery. The SFA sheath was then upsized to 6 [place]. A snare was used to snare the Glidewire from above. 035 quick cross catheter was then passed over the Glidewire to the distal anterior tibial artery. Down size to 018 system. Balloon angioplasty was then performed along the length of the anterior tibial artery with 2.0 mm diameter balloon, 2.5 mm diameter balloon, 3.0 mm diameter balloon. 2 mm balloon was used for hemostasis at the puncture site. Antegrade approach to the stump of the tibioperoneal trunk was used with a combination of Glidewire and quick cross catheter. Balloon angioplasty was performed with 2.5 mm diameter balloon and 3.0 mm diameter balloon at the origin, with restoration of flow. Spasm was induced in the mid and distal peroneal artery. Pedal pulse was confirmed at the conclusion of the case, which was not present at the initiation. All catheters and wires were removed. Sheath was removed from the superficial femoral artery, with manual pressure used for hemostasis. Patient tolerated the procedure well and remained hemodynamically stable throughout. No complications were encountered and no significant blood loss was encountered. FINDINGS: No palpable pulse at the initiation of the case. Doppler signals were present. Initial angiogram demonstrates no significant disease of the superficial femoral artery. Occlusion of the popliteal artery at the joint space of the knee. Occlusion extends through the popliteal artery, tibioperoneal trunk, and proximal anterior tibial artery. Reconstitution of the distal anterior tibial artery and of the peroneal artery. No posterior tibial filling was present. After revascularization, there is in-line flow through the popliteal artery, anterior tibial artery, and peroneal artery to the ankle. Spasm induced in the mid and distal peroneal artery, with palpable pulse at the completion of the angiogram/treatment. IMPRESSION: Status post left lower  extremity angiogram with antegrade and retrograde approach to popliteal occlusion and revascularization with plain balloon angioplasty restoring in-line flow through the popliteal artery, peroneal artery and anterior tibial artery to the ankle. Signed, Yvone NeuJaime S. Loreta AveWagner, DO [facility] PLAN: p.o. Plavix 300 mg now. Left leg straight for 5 hours. Initiate 75 mg p.o. Plavix daily. Electronically Signed   By: Gilmer MorJaime  Wagner D.O.   On:  05/09/2015 16:00   Ir Angiogram Follow Up Study  05/09/2015  CLINICAL DATA:  82 year old female with a history of chronic nonhealing wound of the left lower extremity. She has a vague history of what sounds like embolism to the popliteal artery and surgical thrombectomy in [place] many years prior. Prior inpatient noninvasive study demonstrates abnormal waveforms distally, with nonpalpable pulses of the foot. CT angiogram and runoff confirms a popliteal occlusion as well as tibial occlusions. She presents today for angiogram and attempt at revascularization for wound healing capability. EXAM: ULTRASOUND GUIDED ACCESS LEFT SUPERFICIAL FEMORAL ARTERY ULTRASOUND GUIDED ACCESS LEFT DORSALIS PEDIS REVASCULARIZATION OF POPLITEAL OCCLUSION WITH BALLOON ANGIOPLASTY OF POPLITEAL ARTERY REVASCULARIZATION OF LEFT ANTERIOR TIBIAL ARTERY WITH BALLOON ANGIOPLASTY REVASCULARIZATION OF LEFT TIBIOPERONEAL TRUNK AND PERONEAL ARTERY WITH BALLOON ANGIOPLASTY FLUOROSCOPY TIME:  25 minutes 54 seconds MEDICATIONS AND MEDICAL HISTORY: 2 mg intra-arterial tPA, 800 mcg nitroglycerin, 2.5 mg verapamil intra arterial ANESTHESIA/SEDATION: 2.5 mg Versed, 125 mcg fentanyl 180 minutes sedation time CONTRAST:  VISIPAQUE IODIXANOL 320 MG/ML IV SOLN COMPLICATIONS: Small puncture site hematoma at the left thigh.  SIR category 1. PROCEDURE: The procedure, risks, benefits, and alternatives were explained to the patient and the patient's family. Specific risks  that were addressed included bleeding, infection, contrast reaction, kidney injury, need for further procedure, limb loss, cardiopulmonary collapse, death. Questions regarding the procedure were encouraged and answered. The patient understands and consents to the procedure. Proximal left thigh was prepped and draped in the usual sterile fashion. The left ankle was prepped and draped in the usual sterile fashion. Ultrasound survey of the left proximal thigh was performed with images stored and sent to PACs. A micropuncture needle was used access the left superficial femoral artery under ultrasound in antegrade fashion. With excellent arterial blood flow returned, and an .018 micro wire was passed through the needle, observed to enter the SFA under fluoroscopy. The needle was removed, and a micropuncture sheath was placed over the wire. The inner dilator and wire were removed, and an 035 Bentson wire was advanced under fluoroscopy. The sheath was removed and a standard 5 [place] vascular sheath was placed. The dilator was removed and the sheath was flushed. Angiogram was performed of the left lower extremity. Ultrasound survey of the left dorsalis pedis was performed with images stored and sent to PACs. A micropuncture needle was used to access the left dorsalis pedis artery under ultrasound. With arterial blood flow returned, and an .018 micro wire was passed through the needle, observed to enter the distal anterior tibial artery under fluoroscopy. The needle was removed, and a micropuncture sheath was placed over the wire. The inner dilator and wire were removed. A pharmacologic cocktail was infused. Stiff Glidewire was then passed through the sheath in a retrograde fashion to the anterior tibial artery, traversing the occlusion at the proximal anterior tibial artery and at the popliteal artery. The wire stayed luminal in the superficial femoral artery. The SFA sheath was then upsized to 6 [place]. A snare was used to  snare the Glidewire from above. 035 quick cross catheter was then passed over the Glidewire to the distal anterior tibial artery. Down size to 018 system. Balloon angioplasty was then performed along the length of the anterior tibial artery with 2.0 mm diameter balloon, 2.5 mm diameter balloon, 3.0 mm diameter balloon. 2 mm balloon was used for hemostasis at the puncture site. Antegrade approach to the stump of the tibioperoneal trunk was used with a combination of Glidewire and quick cross  catheter. Balloon angioplasty was performed with 2.5 mm diameter balloon and 3.0 mm diameter balloon at the origin, with restoration of flow. Spasm was induced in the mid and distal peroneal artery. Pedal pulse was confirmed at the conclusion of the case, which was not present at the initiation. All catheters and wires were removed. Sheath was removed from the superficial femoral artery, with manual pressure used for hemostasis. Patient tolerated the procedure well and remained hemodynamically stable throughout. No complications were encountered and no significant blood loss was encountered. FINDINGS: No palpable pulse at the initiation of the case. Doppler signals were present. Initial angiogram demonstrates no significant disease of the superficial femoral artery. Occlusion of the popliteal artery at the joint space of the knee. Occlusion extends through the popliteal artery, tibioperoneal trunk, and proximal anterior tibial artery. Reconstitution of the distal anterior tibial artery and of the peroneal artery. No posterior tibial filling was present. After revascularization, there is in-line flow through the popliteal artery, anterior tibial artery, and peroneal artery to the ankle. Spasm induced in the mid and distal peroneal artery, with palpable pulse at the completion of the angiogram/treatment. IMPRESSION: Status post left lower extremity angiogram with antegrade and retrograde approach to popliteal occlusion and  revascularization with plain balloon angioplasty restoring in-line flow through the popliteal artery, peroneal artery and anterior tibial artery to the ankle. Signed, Yvone Neu. Loreta Ave DO [facility] Radiology PLAN: p.o. Plavix 300 mg now. Left leg straight for 5 hours. Initiate 75 mg p.o. Plavix daily. Electronically Signed   By: Gilmer Mor D.O.   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 She has a vague history of what sounds like embolism to the popliteal artery and surgical thrombectomy in [place] many years prior. Prior inpatient noninvasive study demonstrates abnormal waveforms distally, with nonpalpable pulses of the foot. CT angiogram and runoff confirms a popliteal occlusion as well as tibial occlusions. She presents today for angiogram and attempt at revascularization for wound healing capability. EXAM: ULTRASOUND GUIDED ACCESS LEFT SUPERFICIAL FEMORAL ARTERY ULTRASOUND GUIDED ACCESS LEFT DORSALIS PEDIS REVASCULARIZATION OF POPLITEAL OCCLUSION WITH BALLOON ANGIOPLASTY OF POPLITEAL ARTERY REVASCULARIZATION OF LEFT ANTERIOR TIBIAL ARTERY WITH BALLOON ANGIOPLASTY REVASCULARIZATION OF LEFT TIBIOPERONEAL TRUNK AND PERONEAL ARTERY WITH BALLOON ANGIOPLASTY FLUOROSCOPY TIME:  25 minutes 54 seconds MEDICATIONS AND MEDICAL HISTORY: 2 mg intra-arterial tPA, 800 mcg nitroglycerin, 2.5 mg verapamil intra arterial ANESTHESIA/SEDATION: 2.5 mg Versed, 125 mcg fentanyl 180 minutes sedation time CONTRAST:  VISIPAQUE IODIXANOL 320 MG/ML IV SOLN COMPLICATIONS: Small puncture site hematoma at the left thigh.  SIR category 1. PROCEDURE: The procedure, risks, benefits, and alternatives were explained to the patient and the patient's family. Specific risks that were addressed included bleeding, infection, contrast reaction, kidney injury,  need for further procedure, limb loss, cardiopulmonary collapse, death. Questions regarding the procedure were encouraged and answered. The patient understands and consents to the procedure. Proximal left thigh was prepped and draped in the usual sterile fashion. The left ankle was prepped and draped in the usual sterile fashion. Ultrasound survey of the left proximal thigh was performed with images stored and sent to PACs. A micropuncture needle was used access the left superficial femoral artery under ultrasound in antegrade fashion. With excellent arterial blood flow returned, and an .018 micro wire was passed through the needle, observed to enter the SFA under fluoroscopy. The  needle was removed, and a micropuncture sheath was placed over the wire. The inner dilator and wire were removed, and an 035 Bentson wire was advanced under fluoroscopy. The sheath was removed and a standard 5 [place] vascular sheath was placed. The dilator was removed and the sheath was flushed. Angiogram was performed of the left lower extremity. Ultrasound survey of the left dorsalis pedis was performed with images stored and sent to PACs. A micropuncture needle was used to access the left dorsalis pedis artery under ultrasound. With arterial blood flow returned, and an .018 micro wire was passed through the needle, observed to enter the distal anterior tibial artery under fluoroscopy. The needle was removed, and a micropuncture sheath was placed over the wire. The inner dilator and wire were removed. A pharmacologic cocktail was infused. Stiff Glidewire was then passed through the sheath in a retrograde fashion to the anterior tibial artery, traversing the occlusion at the proximal anterior tibial artery and at the popliteal artery. The wire stayed luminal in the superficial femoral artery. The SFA sheath was then upsized to 6 [place]. A snare was used to snare the Glidewire from above. 035 quick cross catheter was then passed over the  Glidewire to the distal anterior tibial artery. Down size to 018 system. Balloon angioplasty was then performed along the length of the anterior tibial artery with 2.0 mm diameter balloon, 2.5 mm diameter balloon, 3.0 mm diameter balloon. 2 mm balloon was used for hemostasis at the puncture site. Antegrade approach to the stump of the tibioperoneal trunk was used with a combination of Glidewire and quick cross catheter. Balloon angioplasty was performed with 2.5 mm diameter balloon and 3.0 mm diameter balloon at the origin, with restoration of flow. Spasm was induced in the mid and distal peroneal artery. Pedal pulse was confirmed at the conclusion of the case, which was not present at the initiation. All catheters and wires were removed. Sheath was removed from the superficial femoral artery, with manual pressure used for hemostasis. Patient tolerated the procedure well and remained hemodynamically stable throughout. No complications were encountered and no significant blood loss was encountered. FINDINGS: No palpable pulse at the initiation of the case. Doppler signals were present. Initial angiogram demonstrates no significant disease of the superficial femoral artery. Occlusion of the popliteal artery at the joint space of the knee. Occlusion extends through the popliteal artery, tibioperoneal trunk, and proximal anterior tibial artery. Reconstitution of the distal anterior tibial artery and of the peroneal artery. No posterior tibial filling was present. After revascularization, there is in-line flow through the popliteal artery, anterior tibial artery, and peroneal artery to the ankle. Spasm induced in the mid and distal peroneal artery, with palpable pulse at the completion of the angiogram/treatment. IMPRESSION: Status post left lower extremity angiogram with antegrade and retrograde approach to popliteal occlusion and revascularization with plain balloon angioplasty restoring in-line flow through the  popliteal artery, peroneal artery and anterior tibial artery to the ankle. Signed, Yvone Neu. Loreta Ave DO [facility] Radiology PLAN: p.o. Plavix 300 mg now. Left leg straight for 5 hours. Initiate 75 mg p.o. Plavix daily. Electronically Signed   By: Gilmer Mor D.O.   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 She has a vague history of what sounds like embolism to the popliteal artery and surgical thrombectomy  in [place] many years prior. Prior inpatient noninvasive study demonstrates abnormal waveforms distally, with nonpalpable pulses of the foot. CT angiogram and runoff confirms a popliteal occlusion as well as tibial occlusions. She presents today for angiogram and attempt at revascularization for wound healing capability. EXAM: ULTRASOUND GUIDED ACCESS LEFT SUPERFICIAL FEMORAL ARTERY ULTRASOUND GUIDED ACCESS LEFT DORSALIS PEDIS REVASCULARIZATION OF POPLITEAL OCCLUSION WITH BALLOON ANGIOPLASTY OF POPLITEAL ARTERY REVASCULARIZATION OF LEFT ANTERIOR TIBIAL ARTERY WITH BALLOON ANGIOPLASTY REVASCULARIZATION OF LEFT TIBIOPERONEAL TRUNK AND PERONEAL ARTERY WITH BALLOON ANGIOPLASTY FLUOROSCOPY TIME:  25 minutes 54 seconds MEDICATIONS AND MEDICAL HISTORY: 2 mg intra-arterial tPA, 800 mcg nitroglycerin, 2.5 mg verapamil intra arterial ANESTHESIA/SEDATION: 2.5 mg Versed, 125 mcg fentanyl 180 minutes sedation time CONTRAST:  VISIPAQUE IODIXANOL 320 MG/ML IV SOLN COMPLICATIONS: Small puncture site hematoma at the left thigh.  SIR category 1. PROCEDURE: The procedure, risks, benefits, and alternatives were explained to the patient and the patient's family. Specific risks that were addressed included bleeding, infection, contrast reaction, kidney injury, need for further procedure, limb loss, cardiopulmonary collapse, death.  Questions regarding the procedure were encouraged and answered. The patient understands and consents to the procedure. Proximal left thigh was prepped and draped in the usual sterile fashion. The left ankle was prepped and draped in the usual sterile fashion. Ultrasound survey of the left proximal thigh was performed with images stored and sent to PACs. A micropuncture needle was used access the left superficial femoral artery under ultrasound in antegrade fashion. With excellent arterial blood flow returned, and an .018 micro wire was passed through the needle, observed to enter the SFA under fluoroscopy. The needle was removed, and a micropuncture sheath was placed over the wire. The inner dilator and wire were removed, and an 035 Bentson wire was advanced under fluoroscopy. The sheath was removed and a standard 5 [place] vascular sheath was placed. The dilator was removed and the sheath was flushed. Angiogram was performed of the left lower extremity. Ultrasound survey of the left dorsalis pedis was performed with images stored and sent to PACs. A micropuncture needle was used to access the left dorsalis pedis artery under ultrasound. With arterial blood flow returned, and an .018 micro wire was passed through the needle, observed to enter the distal anterior tibial artery under fluoroscopy. The needle was removed, and a micropuncture sheath was placed over the wire. The inner dilator and wire were removed. A pharmacologic cocktail was infused. Stiff Glidewire was then passed through the sheath in a retrograde fashion to the anterior tibial artery, traversing the occlusion at the proximal anterior tibial artery and at the popliteal artery. The wire stayed luminal in the superficial femoral artery. The SFA sheath was then upsized to 6 [place]. A snare was used to snare the Glidewire from above. 035 quick cross catheter was then passed over the Glidewire to the distal anterior tibial artery. Down size to 018 system.  Balloon angioplasty was then performed along the length of the anterior tibial artery with 2.0 mm diameter balloon, 2.5 mm diameter balloon, 3.0 mm diameter balloon. 2 mm balloon was used for hemostasis at the puncture site. Antegrade approach to the stump of the tibioperoneal trunk was used with a combination of Glidewire and quick cross catheter. Balloon angioplasty was performed with 2.5 mm diameter balloon and 3.0 mm diameter balloon at the origin, with restoration of flow. Spasm was induced in the mid and distal peroneal artery. Pedal pulse was confirmed at the conclusion of the case, which was not present  at the initiation. All catheters and wires were removed. Sheath was removed from the superficial femoral artery, with manual pressure used for hemostasis. Patient tolerated the procedure well and remained hemodynamically stable throughout. No complications were encountered and no significant blood loss was encountered. FINDINGS: No palpable pulse at the initiation of the case. Doppler signals were present. Initial angiogram demonstrates no significant disease of the superficial femoral artery. Occlusion of the popliteal artery at the joint space of the knee. Occlusion extends through the popliteal artery, tibioperoneal trunk, and proximal anterior tibial artery. Reconstitution of the distal anterior tibial artery and of the peroneal artery. No posterior tibial filling was present. After revascularization, there is in-line flow through the popliteal artery, anterior tibial artery, and peroneal artery to the ankle. Spasm induced in the mid and distal peroneal artery, with palpable pulse at the completion of the angiogram/treatment. IMPRESSION: Status post left lower extremity angiogram with antegrade and retrograde approach to popliteal occlusion and revascularization with plain balloon angioplasty restoring in-line flow through the popliteal artery, peroneal artery and anterior tibial artery to the ankle.  Signed, Yvone Neu. Loreta Ave DO [facility] Radiology PLAN: p.o. Plavix 300 mg now. Left leg straight for 5 hours. Initiate 75 mg p.o. Plavix daily. Electronically Signed   By: Gilmer Mor D.O.   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 She has a vague history of what sounds like embolism to the popliteal artery and surgical thrombectomy in [place] many years prior. Prior inpatient noninvasive study demonstrates abnormal waveforms distally, with nonpalpable pulses of the foot. CT angiogram and runoff confirms a popliteal occlusion as well as tibial occlusions. She presents today for angiogram and attempt at revascularization for wound healing capability. EXAM: ULTRASOUND GUIDED ACCESS LEFT SUPERFICIAL FEMORAL ARTERY ULTRASOUND GUIDED ACCESS LEFT DORSALIS PEDIS REVASCULARIZATION OF POPLITEAL OCCLUSION WITH BALLOON ANGIOPLASTY OF POPLITEAL ARTERY REVASCULARIZATION OF LEFT ANTERIOR TIBIAL ARTERY WITH BALLOON ANGIOPLASTY REVASCULARIZATION OF LEFT TIBIOPERONEAL TRUNK AND PERONEAL ARTERY WITH BALLOON ANGIOPLASTY FLUOROSCOPY TIME:  25 minutes 54 seconds MEDICATIONS AND MEDICAL HISTORY: 2 mg intra-arterial tPA, 800 mcg nitroglycerin, 2.5 mg verapamil intra arterial ANESTHESIA/SEDATION: 2.5 mg Versed, 125 mcg fentanyl 180 minutes sedation time CONTRAST:  VISIPAQUE IODIXANOL 320 MG/ML IV SOLN COMPLICATIONS: Small puncture site hematoma at the left thigh.  SIR category 1. PROCEDURE: The procedure, risks, benefits, and alternatives were explained to the patient and the patient's family. Specific risks that were addressed included bleeding, infection, contrast reaction, kidney injury, need for further procedure, limb loss, cardiopulmonary collapse, death. Questions regarding the procedure were encouraged and answered. The patient understands and  consents to the procedure. Proximal left thigh was prepped and draped in the usual sterile fashion. The left ankle was prepped and draped in the usual sterile fashion. Ultrasound survey of the left proximal thigh was performed with images stored and sent to PACs. A micropuncture needle was used access the left superficial femoral artery under ultrasound in antegrade fashion. With excellent arterial blood flow returned, and an .018 micro wire was passed through the needle, observed to enter the SFA under fluoroscopy. The needle was removed, and a micropuncture sheath was placed over the wire. The inner dilator and wire were removed, and an 035 Bentson wire was advanced under fluoroscopy. The sheath was removed and a standard 5 [place] vascular sheath was placed. The dilator was removed  and the sheath was flushed. Angiogram was performed of the left lower extremity. Ultrasound survey of the left dorsalis pedis was performed with images stored and sent to PACs. A micropuncture needle was used to access the left dorsalis pedis artery under ultrasound. With arterial blood flow returned, and an .018 micro wire was passed through the needle, observed to enter the distal anterior tibial artery under fluoroscopy. The needle was removed, and a micropuncture sheath was placed over the wire. The inner dilator and wire were removed. A pharmacologic cocktail was infused. Stiff Glidewire was then passed through the sheath in a retrograde fashion to the anterior tibial artery, traversing the occlusion at the proximal anterior tibial artery and at the popliteal artery. The wire stayed luminal in the superficial femoral artery. The SFA sheath was then upsized to 6 [place]. A snare was used to snare the Glidewire from above. 035 quick cross catheter was then passed over the Glidewire to the distal anterior tibial artery. Down size to 018 system. Balloon angioplasty was then performed along the length of the anterior tibial artery with  2.0 mm diameter balloon, 2.5 mm diameter balloon, 3.0 mm diameter balloon. 2 mm balloon was used for hemostasis at the puncture site. Antegrade approach to the stump of the tibioperoneal trunk was used with a combination of Glidewire and quick cross catheter. Balloon angioplasty was performed with 2.5 mm diameter balloon and 3.0 mm diameter balloon at the origin, with restoration of flow. Spasm was induced in the mid and distal peroneal artery. Pedal pulse was confirmed at the conclusion of the case, which was not present at the initiation. All catheters and wires were removed. Sheath was removed from the superficial femoral artery, with manual pressure used for hemostasis. Patient tolerated the procedure well and remained hemodynamically stable throughout. No complications were encountered and no significant blood loss was encountered. FINDINGS: No palpable pulse at the initiation of the case. Doppler signals were present. Initial angiogram demonstrates no significant disease of the superficial femoral artery. Occlusion of the popliteal artery at the joint space of the knee. Occlusion extends through the popliteal artery, tibioperoneal trunk, and proximal anterior tibial artery. Reconstitution of the distal anterior tibial artery and of the peroneal artery. No posterior tibial filling was present. After revascularization, there is in-line flow through the popliteal artery, anterior tibial artery, and peroneal artery to the ankle. Spasm induced in the mid and distal peroneal artery, with palpable pulse at the completion of the angiogram/treatment. IMPRESSION: Status post left lower extremity angiogram with antegrade and retrograde approach to popliteal occlusion and revascularization with plain balloon angioplasty restoring in-line flow through the popliteal artery, peroneal artery and anterior tibial artery to the ankle. Signed, Yvone Neu. Loreta Ave DO [facility]  Radiology PLAN: p.o. Plavix 300 mg now. Left leg straight for 5 hours. Initiate 75 mg p.o. Plavix daily. Electronically Signed   By: Gilmer Mor D.O.   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 She has a vague history of what sounds like embolism to the popliteal artery and surgical thrombectomy in [place] many years prior. Prior inpatient noninvasive study demonstrates abnormal waveforms distally, with nonpalpable pulses of the foot. CT angiogram and runoff confirms a popliteal occlusion as well as tibial occlusions. She presents today for angiogram and attempt at revascularization for wound healing  capability. EXAM: ULTRASOUND GUIDED ACCESS LEFT SUPERFICIAL FEMORAL ARTERY ULTRASOUND GUIDED ACCESS LEFT DORSALIS PEDIS REVASCULARIZATION OF POPLITEAL OCCLUSION WITH BALLOON ANGIOPLASTY OF POPLITEAL ARTERY REVASCULARIZATION OF LEFT ANTERIOR TIBIAL ARTERY WITH BALLOON ANGIOPLASTY REVASCULARIZATION OF LEFT TIBIOPERONEAL TRUNK AND PERONEAL ARTERY WITH BALLOON ANGIOPLASTY FLUOROSCOPY TIME:  25 minutes 54 seconds MEDICATIONS AND MEDICAL HISTORY: 2 mg intra-arterial tPA, 800 mcg nitroglycerin, 2.5 mg verapamil intra arterial ANESTHESIA/SEDATION: 2.5 mg Versed, 125 mcg fentanyl 180 minutes sedation time CONTRAST:  VISIPAQUE IODIXANOL 320 MG/ML IV SOLN COMPLICATIONS: Small puncture site hematoma at the left thigh.  SIR category 1. PROCEDURE: The procedure, risks, benefits, and alternatives were explained to the patient and the patient's family. Specific risks that were addressed included bleeding, infection, contrast reaction, kidney injury, need for further procedure, limb loss, cardiopulmonary collapse, death. Questions regarding the procedure were encouraged and answered. The patient understands and consents to the procedure. Proximal left thigh was prepped and draped  in the usual sterile fashion. The left ankle was prepped and draped in the usual sterile fashion. Ultrasound survey of the left proximal thigh was performed with images stored and sent to PACs. A micropuncture needle was used access the left superficial femoral artery under ultrasound in antegrade fashion. With excellent arterial blood flow returned, and an .018 micro wire was passed through the needle, observed to enter the SFA under fluoroscopy. The needle was removed, and a micropuncture sheath was placed over the wire. The inner dilator and wire were removed, and an 035 Bentson wire was advanced under fluoroscopy. The sheath was removed and a standard 5 [place] vascular sheath was placed. The dilator was removed and the sheath was flushed. Angiogram was performed of the left lower extremity. Ultrasound survey of the left dorsalis pedis was performed with images stored and sent to PACs. A micropuncture needle was used to access the left dorsalis pedis artery under ultrasound. With arterial blood flow returned, and an .018 micro wire was passed through the needle, observed to enter the distal anterior tibial artery under fluoroscopy. The needle was removed, and a micropuncture sheath was placed over the wire. The inner dilator and wire were removed. A pharmacologic cocktail was infused. Stiff Glidewire was then passed through the sheath in a retrograde fashion to the anterior tibial artery, traversing the occlusion at the proximal anterior tibial artery and at the popliteal artery. The wire stayed luminal in the superficial femoral artery. The SFA sheath was then upsized to 6 [place]. A snare was used to snare the Glidewire from above. 035 quick cross catheter was then passed over the Glidewire to the distal anterior tibial artery. Down size to 018 system. Balloon angioplasty was then performed along the length of the anterior tibial artery with 2.0 mm diameter balloon, 2.5 mm diameter balloon, 3.0 mm diameter  balloon. 2 mm balloon was used for hemostasis at the puncture site. Antegrade approach to the stump of the tibioperoneal trunk was used with a combination of Glidewire and quick cross catheter. Balloon angioplasty was performed with 2.5 mm diameter balloon and 3.0 mm diameter balloon at the origin, with restoration of flow. Spasm was induced in the mid and distal peroneal artery. Pedal pulse was confirmed at the conclusion of the case, which was not present at the initiation. All catheters and wires were removed. Sheath was removed from the superficial femoral artery, with manual pressure used for hemostasis. Patient tolerated the procedure well and remained hemodynamically stable throughout. No complications were encountered and no significant blood loss was encountered.  FINDINGS: No palpable pulse at the initiation of the case. Doppler signals were present. Initial angiogram demonstrates no significant disease of the superficial femoral artery. Occlusion of the popliteal artery at the joint space of the knee. Occlusion extends through the popliteal artery, tibioperoneal trunk, and proximal anterior tibial artery. Reconstitution of the distal anterior tibial artery and of the peroneal artery. No posterior tibial filling was present. After revascularization, there is in-line flow through the popliteal artery, anterior tibial artery, and peroneal artery to the ankle. Spasm induced in the mid and distal peroneal artery, with palpable pulse at the completion of the angiogram/treatment. IMPRESSION: Status post left lower extremity angiogram with antegrade and retrograde approach to popliteal occlusion and revascularization with plain balloon angioplasty restoring in-line flow through the popliteal artery, peroneal artery and anterior tibial artery to the ankle. Signed, Yvone Neu. Loreta Ave DO [facility] Radiology PLAN: p.o. Plavix 300 mg now. Left leg straight for 5 hours.  Initiate 75 mg p.o. Plavix daily. Electronically Signed   By: Gilmer Mor D.O.   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 She has a vague history of what sounds like embolism to the popliteal artery and surgical thrombectomy in [place] many years prior. Prior inpatient noninvasive study demonstrates abnormal waveforms distally, with nonpalpable pulses of the foot. CT angiogram and runoff confirms a popliteal occlusion as well as tibial occlusions. She presents today for angiogram and attempt at revascularization for wound healing capability. EXAM: ULTRASOUND GUIDED ACCESS LEFT SUPERFICIAL FEMORAL ARTERY ULTRASOUND GUIDED ACCESS LEFT DORSALIS PEDIS REVASCULARIZATION OF POPLITEAL OCCLUSION WITH BALLOON ANGIOPLASTY OF POPLITEAL ARTERY REVASCULARIZATION OF LEFT ANTERIOR TIBIAL ARTERY WITH BALLOON ANGIOPLASTY REVASCULARIZATION OF LEFT TIBIOPERONEAL TRUNK AND PERONEAL ARTERY WITH BALLOON ANGIOPLASTY FLUOROSCOPY TIME:  25 minutes 54 seconds MEDICATIONS AND MEDICAL HISTORY: 2 mg intra-arterial tPA, 800 mcg nitroglycerin, 2.5 mg verapamil intra arterial ANESTHESIA/SEDATION: 2.5 mg Versed, 125 mcg fentanyl 180 minutes sedation time CONTRAST:  VISIPAQUE IODIXANOL 320 MG/ML IV SOLN COMPLICATIONS: Small puncture site hematoma at the left thigh.  SIR category 1. PROCEDURE: The procedure, risks, benefits, and alternatives were explained to the patient and the patient's family. Specific risks that were addressed included bleeding, infection, contrast reaction, kidney injury, need for further procedure, limb loss, cardiopulmonary collapse, death. Questions regarding the procedure were encouraged and answered. The patient understands and consents to the procedure. Proximal left thigh was prepped and draped in the usual sterile fashion. The left ankle was prepped and draped in the usual sterile  fashion. Ultrasound survey of the left proximal thigh was performed with images stored and sent to PACs. A micropuncture needle was used access the left superficial femoral artery under ultrasound in antegrade fashion. With excellent arterial blood flow returned, and an .018 micro wire was passed through the needle, observed to enter the SFA under fluoroscopy. The needle was removed, and a micropuncture sheath was placed over the wire. The inner dilator and wire were removed, and an 035 Bentson wire was advanced under fluoroscopy. The sheath was removed and a standard 5 [place] vascular sheath was placed. The dilator was removed and the sheath was flushed. Angiogram was performed of the left lower extremity. Ultrasound survey of the left dorsalis pedis was performed with images stored and sent to PACs. A micropuncture needle was used to access the left dorsalis pedis artery under ultrasound. With  arterial blood flow returned, and an .018 micro wire was passed through the needle, observed to enter the distal anterior tibial artery under fluoroscopy. The needle was removed, and a micropuncture sheath was placed over the wire. The inner dilator and wire were removed. A pharmacologic cocktail was infused. Stiff Glidewire was then passed through the sheath in a retrograde fashion to the anterior tibial artery, traversing the occlusion at the proximal anterior tibial artery and at the popliteal artery. The wire stayed luminal in the superficial femoral artery. The SFA sheath was then upsized to 6 [place]. A snare was used to snare the Glidewire from above. 035 quick cross catheter was then passed over the Glidewire to the distal anterior tibial artery. Down size to 018 system. Balloon angioplasty was then performed along the length of the anterior tibial artery with 2.0 mm diameter balloon, 2.5 mm diameter balloon, 3.0 mm diameter balloon. 2 mm balloon was used for hemostasis at the puncture site. Antegrade approach to the  stump of the tibioperoneal trunk was used with a combination of Glidewire and quick cross catheter. Balloon angioplasty was performed with 2.5 mm diameter balloon and 3.0 mm diameter balloon at the origin, with restoration of flow. Spasm was induced in the mid and distal peroneal artery. Pedal pulse was confirmed at the conclusion of the case, which was not present at the initiation. All catheters and wires were removed. Sheath was removed from the superficial femoral artery, with manual pressure used for hemostasis. Patient tolerated the procedure well and remained hemodynamically stable throughout. No complications were encountered and no significant blood loss was encountered. FINDINGS: No palpable pulse at the initiation of the case. Doppler signals were present. Initial angiogram demonstrates no significant disease of the superficial femoral artery. Occlusion of the popliteal artery at the joint space of the knee. Occlusion extends through the popliteal artery, tibioperoneal trunk, and proximal anterior tibial artery. Reconstitution of the distal anterior tibial artery and of the peroneal artery. No posterior tibial filling was present. After revascularization, there is in-line flow through the popliteal artery, anterior tibial artery, and peroneal artery to the ankle. Spasm induced in the mid and distal peroneal artery, with palpable pulse at the completion of the angiogram/treatment. IMPRESSION: Status post left lower extremity angiogram with antegrade and retrograde approach to popliteal occlusion and revascularization with plain balloon angioplasty restoring in-line flow through the popliteal artery, peroneal artery and anterior tibial artery to the ankle. Signed, Yvone Neu. Loreta Ave DO [facility] Radiology PLAN: p.o. Plavix 300 mg now. Left leg straight for 5 hours. Initiate 75 mg p.o. Plavix daily. Electronically Signed   By: Gilmer Mor D.O.   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 She has a vague history of what sounds like embolism to the popliteal artery and surgical thrombectomy in [place] many years prior. Prior inpatient noninvasive study demonstrates abnormal waveforms distally, with nonpalpable pulses of the foot. CT angiogram and runoff confirms a popliteal occlusion as well as tibial occlusions. She presents today for angiogram and attempt at revascularization for wound healing capability. EXAM: ULTRASOUND GUIDED ACCESS LEFT SUPERFICIAL FEMORAL ARTERY ULTRASOUND GUIDED ACCESS LEFT DORSALIS PEDIS REVASCULARIZATION OF POPLITEAL OCCLUSION WITH BALLOON ANGIOPLASTY OF POPLITEAL ARTERY REVASCULARIZATION OF LEFT ANTERIOR TIBIAL ARTERY WITH BALLOON ANGIOPLASTY REVASCULARIZATION OF LEFT TIBIOPERONEAL TRUNK AND PERONEAL ARTERY WITH BALLOON ANGIOPLASTY FLUOROSCOPY TIME:  25 minutes 54 seconds MEDICATIONS AND MEDICAL HISTORY: 2 mg intra-arterial tPA, 800 mcg nitroglycerin, 2.5 mg verapamil intra arterial ANESTHESIA/SEDATION: 2.5 mg Versed, 125 mcg fentanyl 180 minutes sedation time CONTRAST:  VISIPAQUE IODIXANOL 320 MG/ML IV SOLN COMPLICATIONS: Small puncture site hematoma at the left thigh.  SIR category 1. PROCEDURE: The procedure, risks, benefits, and alternatives were explained to the patient and the patient's family. Specific risks that were addressed included bleeding, infection, contrast reaction, kidney injury, need for further procedure, limb loss, cardiopulmonary collapse, death. Questions regarding the procedure were encouraged and answered. The patient understands and consents to the procedure. Proximal left thigh was prepped and draped in the usual sterile fashion. The left ankle was prepped and draped in the usual sterile fashion. Ultrasound survey of the left proximal thigh was performed with images stored  and sent to PACs. A micropuncture needle was used access the left superficial femoral artery under ultrasound in antegrade fashion. With excellent arterial blood flow returned, and an .018 micro wire was passed through the needle, observed to enter the SFA under fluoroscopy. The needle was removed, and a micropuncture sheath was placed over the wire. The inner dilator and wire were removed, and an 035 Bentson wire was advanced under fluoroscopy. The sheath was removed and a standard 5 [place] vascular sheath was placed. The dilator was removed and the sheath was flushed. Angiogram was performed of the left lower extremity. Ultrasound survey of the left dorsalis pedis was performed with images stored and sent to PACs. A micropuncture needle was used to access the left dorsalis pedis artery under ultrasound. With arterial blood flow returned, and an .018 micro wire was passed through the needle, observed to enter the distal anterior tibial artery under fluoroscopy. The needle was removed, and a micropuncture sheath was placed over the wire. The inner dilator and wire were removed. A pharmacologic cocktail was infused. Stiff Glidewire was then passed through the sheath in a retrograde fashion to the anterior tibial artery, traversing the occlusion at the proximal anterior tibial artery and at the popliteal artery. The wire stayed luminal in the superficial femoral artery. The SFA sheath was then upsized to 6 [place]. A snare was used to snare the Glidewire from above. 035 quick cross catheter was then passed over the Glidewire to the distal anterior tibial artery. Down size to 018 system. Balloon angioplasty was then performed along the length of the anterior tibial artery with 2.0 mm diameter balloon, 2.5 mm diameter balloon, 3.0 mm diameter balloon. 2 mm balloon was used for hemostasis at the puncture site. Antegrade approach to the stump of the tibioperoneal trunk was used with a combination of Glidewire and quick  cross catheter. Balloon angioplasty was performed with 2.5 mm diameter balloon and 3.0 mm diameter balloon at the origin, with restoration of flow. Spasm was induced in the mid and distal peroneal artery. Pedal pulse was confirmed at the conclusion of the case, which was not present at the initiation. All catheters and wires were removed. Sheath was removed from the superficial femoral artery, with manual pressure used for hemostasis. Patient tolerated the procedure well and remained hemodynamically stable throughout. No complications were encountered and no significant blood loss was encountered. FINDINGS: No palpable pulse at the initiation of the case. Doppler signals were present. Initial angiogram demonstrates no significant disease of the superficial femoral artery. Occlusion of the popliteal artery at the joint space of the knee. Occlusion extends through the popliteal artery, tibioperoneal trunk, and proximal anterior  tibial artery. Reconstitution of the distal anterior tibial artery and of the peroneal artery. No posterior tibial filling was present. After revascularization, there is in-line flow through the popliteal artery, anterior tibial artery, and peroneal artery to the ankle. Spasm induced in the mid and distal peroneal artery, with palpable pulse at the completion of the angiogram/treatment. IMPRESSION: Status post left lower extremity angiogram with antegrade and retrograde approach to popliteal occlusion and revascularization with plain balloon angioplasty restoring in-line flow through the popliteal artery, peroneal artery and anterior tibial artery to the ankle. Signed, Yvone Neu. Loreta Ave DO [facility] Radiology PLAN: p.o. Plavix 300 mg now. Left leg straight for 5 hours. Initiate 75 mg p.o. Plavix daily. Electronically Signed   By: Gilmer Mor D.O.   On: 05/09/2015 16:00   I have personally reviewed and evaluated these images and lab results  as part of my medical decision-making.   EKG Interpretation   Date/Time:  Friday May 10 2015 01:20:50 EST Ventricular Rate:  123 PR Interval:  176 QRS Duration: 104 QT Interval:  353 QTC Calculation: 505 R Axis:   -70 Text Interpretation:  Atrial fibrillation Left anterior fascicular block  Anteroseptal infarct, old Nonspecific T abnormalities, lateral leads  Prolonged QT interval Confirmed by Aquilla Shambley  MD, Chia Rock ([id number]) on  05/10/2015 1:35:06 AM      MDM   Final diagnoses:  History of femoral angiogram  Syncope, unspecified syncope type  Other specified hypotension    Patient presents with dizziness and a fall. Noted to be hypotensive and tachycardic. Recent procedure. She is neurologically intact and at her baseline otherwise. She is fluent speech.  No focal deficits. Dizziness and fall likely related to low pressure. Patient does have a history of an EF of 45%. She was given soft boluses. Blood pressure improved with during bolus; however, declined after fluids stopped.  Lab work obtained. Patient appears dry with acute kidney injury. Daughter reports that she's not eaten since yesterday.  Hemoglobin went from 10-7.9.  Would be concerned for occult bleed that is not obvious on physical exam.  Cannot obtain CTA as patient's GFR is 24. Discussed with radiology who recommends CT noncontrast of the abdomen and pelvis to rule out retroperitoneal bleed as well as extension into the left leg.This is reassuring. CT head and neck reassuring after fall Patient received a total of 1500 mL of fluid and remained borderline hypotensive. She's been ambulatory and remains nonfocal. Additionally, further expirations for hypertension were explored including infection. No evidence of pneumonia or urinary tract infection. Lactate mildly elevated at 2.59. Doubt sepsis.However, given patient's blood pressures, will admit for further hydration and monitoring.  I personally performed the services  described in this documentation, which was scribed in my presence. The recorded information has been reviewed and is accurate.     Shon Baton, MD 05/10/15 [phone]

## 2015-05-10 NOTE — Progress Notes (Signed)
VASCULAR LAB PRELIMINARY  PRELIMINARY  PRELIMINARY  PRELIMINARY   Bilateral lower extremity venous Doppler has been completed. Bilateral:  No evidence of DVT, superficial thrombosis, or Baker's Cyst.     Jenetta Logesami [name], RVT, RDMS 05/10/2015, 4:43 PM

## 2015-05-10 NOTE — ED Notes (Addendum)
Pt. arrived with EMS from [facility] assisted [facility] facility , lost her balance and fell inside her room this evening , denies LOC , presents with 2 skin tear at right upper forearm , pt. also stated that she had a procedure done this morning " arteriogram" , pt. added that neck pain stems from lying flat from the procedure . Alert and oriented at arrival , respirations unlabored . Pt. normally sleeps semirecumbent position . Hypotensive at arrival.

## 2015-05-10 NOTE — ED Notes (Signed)
Patient transported to CT SCAN . 

## 2015-05-10 NOTE — Progress Notes (Signed)
Referring Physician(s): Dr Joanne Gavel  Chief Complaint:  Status post left lower extremity angiogram with antegrade and retrograde approach to popliteal occlusion and revascularization with plain balloon angioplasty restoring in-line flow through the popliteal artery, peroneal artery and anterior tibial artery to the ankle.  Subjective:  Pt discharged home from [facility] yesterday to home (assisted living facility) Seemed "out of sorts" according to family and lost balance while walking in home [place] and hit Rt knee and Rt elbow Scraped Left knee Was sent to ED 1 am Denies headache Does complain of Rt neck pain since procedure---feels secondary to "laying for so long" Denies vision or speech change Seems appropriate while talking to her now  Pt also still with reddened skin of legs and abd since procedure---some better today Left groin area hematoma may be some smaller today CT 1/13 am: No definite retroperitoneal or intraperitoneal hematoma identified. No hematoma seen in the visualized portions of the lower extremities. VASCULAR LAB PRELIMINARY PRELIMINARY PRELIMINARY PRELIMINARY  Left ultrasound of groin completed.   Preliminary report: There is no obvious evidence of pseudoaneurysm or AV fistula noted in the left groin.   Asked to see pt and determine if can have at home meds including coumadin and Plavix Dr Loreta Ave to see pt today also  Allergies: Bactrim ds; Contrast media; Ciprofloxacin; and Tape  Medications: Prior to Admission medications   Medication Sig Start Date End Date Taking? Authorizing Provider  cetirizine (ZYRTEC) 10 MG tablet Take 1 tablet (10 mg total) by mouth 2 (two) times daily. X 14 days 05/01/15  Yes Nelwyn Salisbury, MD  clopidogrel (PLAVIX) 75 MG tablet Take 1 tablet (75 mg total) by mouth daily. 05/09/15  Yes Gilmer Mor, DO  collagenase (SANTYL) ointment Apply 1 application topically every other day. To bilateral lower extremities wounds   Yes  Historical Provider, MD  docusate sodium (COLACE) 100 MG capsule Take 100 mg by mouth daily.   Yes Historical Provider, MD  levothyroxine (SYNTHROID, LEVOTHROID) 25 MCG tablet TAKE 1 AND 1/2 TABS (37.5MG ) BY MOUTH EVERY DAY*CHECK PULSE WEEKLY* 07/14/12  Yes Jacques Navy, MD  metoprolol succinate (TOPROL-XL) 25 MG 24 hr tablet Take 0.5 tablets (12.5 mg total) by mouth daily. 11/01/12  Yes Jacques Navy, MD  Multiple Vitamin (MULTIVITAMIN WITH MINERALS) TABS Take 1 tablet by mouth every morning.   Yes Historical Provider, MD  oxyCODONE (OXYCONTIN) 10 mg 12 hr tablet Take 1 tablet (10 mg total) by mouth every 12 (twelve) hours. 05/01/15  Yes Gordy Savers, MD  potassium chloride (K-DUR,KLOR-CON) 10 MEQ tablet Take 1 tablet (10 mEq total) by mouth 2 (two) times daily. 08/11/12  Yes Hadassah Pais, PA-C  predniSONE (DELTASONE) 50 MG tablet Take 50 mg by mouth once.   Yes Historical Provider, MD  silver sulfADIAZINE (SILVADENE) 1 % cream Apply 1 application topically 2 (two) times daily. Apply to wounds 1-2 times daily as directed.   Yes Historical Provider, MD  torsemide (DEMADEX) 20 MG tablet TAKE 1 TAB BY MOUTH EVERY DAY -- DX: EDEMA AND TAKE 1 TAB BY MOUTH AT 5PM ON MON WED FRI (THIS IS.IN ADDITION TO MORNING SCHEDULE DOSE) -... Patient taking differently: Take 1 tablet by mouth every morning at 8 am in addition to toresemide 20mg  three times weekly 04/24/14  Yes Laurey Morale, MD  torsemide [facility] 20 MG tablet Take 20 mg by mouth every Monday, Wednesday, and Friday. At 5 pm in addition to daily torsemide 20mg   Yes Historical Provider, MD  nystatin (MYCOSTATIN) powder Apply topically 4 (four) times daily. Patient not taking: Reported on 05/10/2015 07/08/12   Jacques Navy, MD     Vital Signs: BP 92/43 mmHg  Pulse 100  Temp(Src) 98 F (36.7 C) (Rectal)  Resp 25  SpO2 99%  Physical Exam  Constitutional: She is oriented to person, place, and time. She appears well-developed and  well-nourished.  HENT:  Head: Atraumatic.  Eyes: EOM are normal.  Neck: Neck supple.  Tender at Rt side Seems "stiff" according to pt  Cardiovascular: Normal rate and regular rhythm.   Pulmonary/Chest: Effort normal and breath sounds normal. She has no wheezes.  Abdominal: Soft. Bowel sounds are normal.  Musculoskeletal: Normal range of motion.  Abrasion to Rt elbow Small scrape to Left knee Feet with 1+ pulse with doppler (posterior tibial pulse B) Left groin NT no bleeding Hematoma seems smaller NT    Neurological: She is alert and oriented to person, place, and time.  Skin: Skin is warm. There is erythema.  Reddened skin after procedure---resolving per pt  Psychiatric: She has a normal mood and affect. Her behavior is normal. Judgment and thought content normal.  Nursing note and vitals reviewed.   Imaging: Ct Abdomen Pelvis Wo Contrast  05/10/2015  CLINICAL DATA:  82 year old female with severe back pain concern for retroperitoneal hemorrhage. EXAM: CT ABDOMEN AND PELVIS WITHOUT CONTRAST TECHNIQUE: Multidetector CT imaging of the abdomen and pelvis was performed following the standard protocol without IV contrast. COMPARISON:  Radiograph dated 05/10/2015 and CT dated 04/25/2015 FINDINGS: Evaluation of this exam is limited in the absence of intravenous contrast. Evaluation is also limited due to streak artifact caused by patient's arms. Mild bibasilar linear atelectatic changes. There is cardiomegaly. There is coronary vascular calcification. No intra-abdominal free air. No free fluid. The liver appears unremarkable. High attenuating material within the gallbladder may represent vicariously excreted contrast from recent study or small gallstones. The pancreas is not well visualized. A 1.6 cm stable appearing hypodensity is again noted in the region of the head of the pancreas similar to the prior study. The spleen appears unremarkable. The adrenal glands are not well visualized. There  is stable appearing low positioning and malrotation of the right kidney. There is no hydronephrosis on either side. There is excreted contrast in the right renal pelvis and within the urinary bladder. The uterus is grossly unremarkable. There is apparent thickening of the wall of the stomach which may be related to underdistention. Gastritis is not excluded. Clinical correlation is recommended. Constipation. Dense stool within the rectum may represent a degree of fecal impaction. There is no evidence of bowel obstruction. There is atherosclerotic calcification of the aorta. No portal venous gas identified. There is no lymphadenopathy. No definite intraperitoneal or retroperitoneal hemorrhage noted. There is diffuse subcutaneous soft tissue stranding. Dilated and serpiginous collateral vasculature noted in the subcutaneous soft tissue of the right anterior abdominal wall. There is diffuse stranding of the subcutaneous tissues of the gluteal and perineal region. No fluid collection or hematoma identified. There is osteopenia. Degenerative changes of the spine. There is L2 compression fracture with near complete loss of vertebral body height and anterior wedging similar to prior study. No acute fracture. IMPRESSION: No definite retroperitoneal or intraperitoneal hematoma identified. No hematoma seen in the visualized portions of the lower extremities. Electronically Signed   By: Elgie Collard M.D.   On: 05/10/2015 06:10   Dg Chest 2 View  05/10/2015  CLINICAL DATA:  82 year old female with hypotension and heart failure. EXAM: CHEST  2 VIEW COMPARISON:  Chest radiograph dated 01/23/2015 FINDINGS: Two views of the chest demonstrate stable cardiomegaly. No focal consolidation, pleural effusion, or pneumothorax. The osseous structures are grossly unremarkable. IMPRESSION: Stable cardiomegaly.  No acute cardiopulmonary process. Electronically Signed   By: Elgie Collard M.D.   On: 05/10/2015 06:19   Dg Lumbar  Spine Complete  05/10/2015  CLINICAL DATA:  Fall, diffuse pain. EXAM: LUMBAR SPINE - COMPLETE 4+ VIEW COMPARISON:  Reformats from CT lower extremity angiogram 04/25/2015 FINDINGS: Severe compression deformity of L2 is unchanged from prior exam. There is otherwise no acute fracture. Mild anterolisthesis of L5 on S1 is unchanged from CT. Multilevel facet arthropathy is seen. Significant osteoporosis. Excreted contrast within kidneys and urinary bladder from angiogram yesterday. IMPRESSION: Chronic compression deformity of L3. No definite acute fracture. Severe osteoporosis. Electronically Signed   By: Rubye Oaks M.D.   On: 05/10/2015 03:32   Ct Head Wo Contrast  05/10/2015  CLINICAL DATA:  Lost her balance, fall to ground. Neck pain. Slurred speech. EXAM: CT HEAD WITHOUT CONTRAST CT CERVICAL SPINE WITHOUT CONTRAST TECHNIQUE: Multidetector CT imaging of the head and cervical spine was performed following the standard protocol without intravenous contrast. Multiplanar CT image reconstructions of the cervical spine were also generated. COMPARISON:  None. FINDINGS: CT HEAD FINDINGS No intracranial hemorrhage, mass effect, or midline shift. Generalized atrophy and chronic small vessel ischemia. No hydrocephalus. Portions of the skullbase are obscured by streak artifact from metallic dentures/dental hardware. The basilar cisterns are patent. No evidence of territorial infarct. No intracranial fluid collection. Calvarium is intact. Right maxillary sinuses moderate of, no paranasal sinus inflammatory change. Mild sclerosis but inferior left mastoid air cells. CT CERVICAL SPINE FINDINGS Exaggerated cervical lordosis. Diffuse osteoporosis. Vertebral body heights are preserved. There is no fracture. The dens is intact. There are no jumped or perched facets. Disc space narrowing at C5-C6. Mild multilevel facet arthropathy. No prevertebral soft tissue edema. The esophagus appears dilated were visualized. IMPRESSION: 1.  Atrophy and chronic small vessel ischemia without acute intracranial abnormality. 2. Degenerative change in osteoporosis in the cervical spine. No acute fracture or subluxation. Electronically Signed   By: Rubye Oaks M.D.   On: 05/10/2015 03:50   Ct Cervical Spine Wo Contrast  05/10/2015  CLINICAL DATA:  Lost her balance, fall to ground. Neck pain. Slurred speech. EXAM: CT HEAD WITHOUT CONTRAST CT CERVICAL SPINE WITHOUT CONTRAST TECHNIQUE: Multidetector CT imaging of the head and cervical spine was performed following the standard protocol without intravenous contrast. Multiplanar CT image reconstructions of the cervical spine were also generated. COMPARISON:  None. FINDINGS: CT HEAD FINDINGS No intracranial hemorrhage, mass effect, or midline shift. Generalized atrophy and chronic small vessel ischemia. No hydrocephalus. Portions of the skullbase are obscured by streak artifact from metallic dentures/dental hardware. The basilar cisterns are patent. No evidence of territorial infarct. No intracranial fluid collection. Calvarium is intact. Right maxillary sinuses moderate of, no paranasal sinus inflammatory change. Mild sclerosis but inferior left mastoid air cells. CT CERVICAL SPINE FINDINGS Exaggerated cervical lordosis. Diffuse osteoporosis. Vertebral body heights are preserved. There is no fracture. The dens is intact. There are no jumped or perched facets. Disc space narrowing at C5-C6. Mild multilevel facet arthropathy. No prevertebral soft tissue edema. The esophagus appears dilated were visualized. IMPRESSION: 1. Atrophy and chronic small vessel ischemia without acute intracranial abnormality. 2. Degenerative change in osteoporosis in the cervical spine. No acute fracture or subluxation. Electronically Signed  By: Rubye Oaks M.D.   On: 05/10/2015 03:50   Ir Angiogram Extremity Left  05/09/2015  CLINICAL DATA:  82 year old female with a history of chronic nonhealing wound of the left lower  extremity. She has a vague history of what sounds like embolism to the popliteal artery and surgical thrombectomy in [place] many years prior. Prior inpatient noninvasive study demonstrates abnormal waveforms distally, with nonpalpable pulses of the foot. CT angiogram and runoff confirms a popliteal occlusion as well as tibial occlusions. She presents today for angiogram and attempt at revascularization for wound healing capability. EXAM: ULTRASOUND GUIDED ACCESS LEFT SUPERFICIAL FEMORAL ARTERY ULTRASOUND GUIDED ACCESS LEFT DORSALIS PEDIS REVASCULARIZATION OF POPLITEAL OCCLUSION WITH BALLOON ANGIOPLASTY OF POPLITEAL ARTERY REVASCULARIZATION OF LEFT ANTERIOR TIBIAL ARTERY WITH BALLOON ANGIOPLASTY REVASCULARIZATION OF LEFT TIBIOPERONEAL TRUNK AND PERONEAL ARTERY WITH BALLOON ANGIOPLASTY FLUOROSCOPY TIME:  25 minutes 54 seconds MEDICATIONS AND MEDICAL HISTORY: 2 mg intra-arterial tPA, 800 mcg nitroglycerin, 2.5 mg verapamil intra arterial ANESTHESIA/SEDATION: 2.5 mg Versed, 125 mcg fentanyl 180 minutes sedation time CONTRAST:  VISIPAQUE IODIXANOL 320 MG/ML IV SOLN COMPLICATIONS: Small puncture site hematoma at the left thigh.  SIR category 1. PROCEDURE: The procedure, risks, benefits, and alternatives were explained to the patient and the patient's family. Specific risks that were addressed included bleeding, infection, contrast reaction, kidney injury, need for further procedure, limb loss, cardiopulmonary collapse, death. Questions regarding the procedure were encouraged and answered. The patient understands and consents to the procedure. Proximal left thigh was prepped and draped in the usual sterile fashion. The left ankle was prepped and draped in the usual sterile fashion. Ultrasound survey of the left proximal thigh was performed with images stored and sent to PACs. A micropuncture needle was used access the left superficial femoral artery under ultrasound in antegrade fashion. With excellent arterial  blood flow returned, and an .018 micro wire was passed through the needle, observed to enter the SFA under fluoroscopy. The needle was removed, and a micropuncture sheath was placed over the wire. The inner dilator and wire were removed, and an 035 Bentson wire was advanced under fluoroscopy. The sheath was removed and a standard 5 [place] vascular sheath was placed. The dilator was removed and the sheath was flushed. Angiogram was performed of the left lower extremity. Ultrasound survey of the left dorsalis pedis was performed with images stored and sent to PACs. A micropuncture needle was used to access the left dorsalis pedis artery under ultrasound. With arterial blood flow returned, and an .018 micro wire was passed through the needle, observed to enter the distal anterior tibial artery under fluoroscopy. The needle was removed, and a micropuncture sheath was placed over the wire. The inner dilator and wire were removed. A pharmacologic cocktail was infused. Stiff Glidewire was then passed through the sheath in a retrograde fashion to the anterior tibial artery, traversing the occlusion at the proximal anterior tibial artery and at the popliteal artery. The wire stayed luminal in the superficial femoral artery. The SFA sheath was then upsized to 6 [place]. A snare was used to snare the Glidewire from above. 035 quick cross catheter was then passed over the Glidewire to the distal anterior tibial artery. Down size to 018 system. Balloon angioplasty was then performed along the length of the anterior tibial artery with 2.0 mm diameter balloon, 2.5 mm diameter balloon, 3.0 mm diameter balloon. 2 mm balloon was used for hemostasis at the puncture site. Antegrade approach to the stump of the tibioperoneal trunk was used with  a combination of Glidewire and quick cross catheter. Balloon angioplasty was performed with 2.5 mm diameter balloon and 3.0 mm diameter balloon at the origin, with restoration of flow. Spasm was  induced in the mid and distal peroneal artery. Pedal pulse was confirmed at the conclusion of the case, which was not present at the initiation. All catheters and wires were removed. Sheath was removed from the superficial femoral artery, with manual pressure used for hemostasis. Patient tolerated the procedure well and remained hemodynamically stable throughout. No complications were encountered and no significant blood loss was encountered. FINDINGS: No palpable pulse at the initiation of the case. Doppler signals were present. Initial angiogram demonstrates no significant disease of the superficial femoral artery. Occlusion of the popliteal artery at the joint space of the knee. Occlusion extends through the popliteal artery, tibioperoneal trunk, and proximal anterior tibial artery. Reconstitution of the distal anterior tibial artery and of the peroneal artery. No posterior tibial filling was present. After revascularization, there is in-line flow through the popliteal artery, anterior tibial artery, and peroneal artery to the ankle. Spasm induced in the mid and distal peroneal artery, with palpable pulse at the completion of the angiogram/treatment. IMPRESSION: Status post left lower extremity angiogram with antegrade and retrograde approach to popliteal occlusion and revascularization with plain balloon angioplasty restoring in-line flow through the popliteal artery, peroneal artery and anterior tibial artery to the ankle. Signed, Yvone Neu. Loreta Ave DO [facility] Radiology PLAN: p.o. Plavix 300 mg now. Left leg straight for 5 hours. Initiate 75 mg p.o. Plavix daily. Electronically Signed   By: Gilmer Mor D.O.   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 She has a vague history of what sounds like embolism to the popliteal artery  and surgical thrombectomy in [place] many years prior. Prior inpatient noninvasive study demonstrates abnormal waveforms distally, with nonpalpable pulses of the foot. CT angiogram and runoff confirms a popliteal occlusion as well as tibial occlusions. She presents today for angiogram and attempt at revascularization for wound healing capability. EXAM: ULTRASOUND GUIDED ACCESS LEFT SUPERFICIAL FEMORAL ARTERY ULTRASOUND GUIDED ACCESS LEFT DORSALIS PEDIS REVASCULARIZATION OF POPLITEAL OCCLUSION WITH BALLOON ANGIOPLASTY OF POPLITEAL ARTERY REVASCULARIZATION OF LEFT ANTERIOR TIBIAL ARTERY WITH BALLOON ANGIOPLASTY REVASCULARIZATION OF LEFT TIBIOPERONEAL TRUNK AND PERONEAL ARTERY WITH BALLOON ANGIOPLASTY FLUOROSCOPY TIME:  25 minutes 54 seconds MEDICATIONS AND MEDICAL HISTORY: 2 mg intra-arterial tPA, 800 mcg nitroglycerin, 2.5 mg verapamil intra arterial ANESTHESIA/SEDATION: 2.5 mg Versed, 125 mcg fentanyl 180 minutes sedation time CONTRAST:  VISIPAQUE IODIXANOL 320 MG/ML IV SOLN COMPLICATIONS: Small puncture site hematoma at the left thigh.  SIR category 1. PROCEDURE: The procedure, risks, benefits, and alternatives were explained to the patient and the patient's family. Specific risks that were addressed included bleeding, infection, contrast reaction, kidney injury, need for further procedure, limb loss, cardiopulmonary collapse, death. Questions regarding the procedure were encouraged and answered. The patient understands and consents to the procedure. Proximal left thigh was prepped and draped in the usual sterile fashion. The left ankle was prepped and draped in the usual sterile fashion. Ultrasound survey of the left proximal thigh was performed with images stored and sent to PACs. A micropuncture needle was used access the left superficial femoral artery under ultrasound in antegrade fashion. With excellent arterial blood flow returned, and an .018 micro wire was passed through the needle, observed  to  enter the SFA under fluoroscopy. The needle was removed, and a micropuncture sheath was placed over the wire. The inner dilator and wire were removed, and an 035 Bentson wire was advanced under fluoroscopy. The sheath was removed and a standard 5 [place] vascular sheath was placed. The dilator was removed and the sheath was flushed. Angiogram was performed of the left lower extremity. Ultrasound survey of the left dorsalis pedis was performed with images stored and sent to PACs. A micropuncture needle was used to access the left dorsalis pedis artery under ultrasound. With arterial blood flow returned, and an .018 micro wire was passed through the needle, observed to enter the distal anterior tibial artery under fluoroscopy. The needle was removed, and a micropuncture sheath was placed over the wire. The inner dilator and wire were removed. A pharmacologic cocktail was infused. Stiff Glidewire was then passed through the sheath in a retrograde fashion to the anterior tibial artery, traversing the occlusion at the proximal anterior tibial artery and at the popliteal artery. The wire stayed luminal in the superficial femoral artery. The SFA sheath was then upsized to 6 [place]. A snare was used to snare the Glidewire from above. 035 quick cross catheter was then passed over the Glidewire to the distal anterior tibial artery. Down size to 018 system. Balloon angioplasty was then performed along the length of the anterior tibial artery with 2.0 mm diameter balloon, 2.5 mm diameter balloon, 3.0 mm diameter balloon. 2 mm balloon was used for hemostasis at the puncture site. Antegrade approach to the stump of the tibioperoneal trunk was used with a combination of Glidewire and quick cross catheter. Balloon angioplasty was performed with 2.5 mm diameter balloon and 3.0 mm diameter balloon at the origin, with restoration of flow. Spasm was induced in the mid and distal peroneal artery. Pedal pulse was confirmed at the  conclusion of the case, which was not present at the initiation. All catheters and wires were removed. Sheath was removed from the superficial femoral artery, with manual pressure used for hemostasis. Patient tolerated the procedure well and remained hemodynamically stable throughout. No complications were encountered and no significant blood loss was encountered. FINDINGS: No palpable pulse at the initiation of the case. Doppler signals were present. Initial angiogram demonstrates no significant disease of the superficial femoral artery. Occlusion of the popliteal artery at the joint space of the knee. Occlusion extends through the popliteal artery, tibioperoneal trunk, and proximal anterior tibial artery. Reconstitution of the distal anterior tibial artery and of the peroneal artery. No posterior tibial filling was present. After revascularization, there is in-line flow through the popliteal artery, anterior tibial artery, and peroneal artery to the ankle. Spasm induced in the mid and distal peroneal artery, with palpable pulse at the completion of the angiogram/treatment. IMPRESSION: Status post left lower extremity angiogram with antegrade and retrograde approach to popliteal occlusion and revascularization with plain balloon angioplasty restoring in-line flow through the popliteal artery, peroneal artery and anterior tibial artery to the ankle. Signed, Yvone Neu. Loreta Ave DO [facility] Radiology PLAN: p.o. Plavix 300 mg now. Left leg straight for 5 hours. Initiate 75 mg p.o. Plavix daily. Electronically Signed   By: Gilmer Mor D.O.   On: 05/09/2015 16:00   Ir Angiogram Follow Up Study  05/09/2015  CLINICAL DATA:  81 year old female with a history of chronic nonhealing wound of the left lower extremity. She has a vague history of what sounds like embolism to the popliteal artery  and surgical thrombectomy in [place] many years prior. Prior inpatient  noninvasive study demonstrates abnormal waveforms distally, with nonpalpable pulses of the foot. CT angiogram and runoff confirms a popliteal occlusion as well as tibial occlusions. She presents today for angiogram and attempt at revascularization for wound healing capability. EXAM: ULTRASOUND GUIDED ACCESS LEFT SUPERFICIAL FEMORAL ARTERY ULTRASOUND GUIDED ACCESS LEFT DORSALIS PEDIS REVASCULARIZATION OF POPLITEAL OCCLUSION WITH BALLOON ANGIOPLASTY OF POPLITEAL ARTERY REVASCULARIZATION OF LEFT ANTERIOR TIBIAL ARTERY WITH BALLOON ANGIOPLASTY REVASCULARIZATION OF LEFT TIBIOPERONEAL TRUNK AND PERONEAL ARTERY WITH BALLOON ANGIOPLASTY FLUOROSCOPY TIME:  25 minutes 54 seconds MEDICATIONS AND MEDICAL HISTORY: 2 mg intra-arterial tPA, 800 mcg nitroglycerin, 2.5 mg verapamil intra arterial ANESTHESIA/SEDATION: 2.5 mg Versed, 125 mcg fentanyl 180 minutes sedation time CONTRAST:  VISIPAQUE IODIXANOL 320 MG/ML IV SOLN COMPLICATIONS: Small puncture site hematoma at the left thigh.  SIR category 1. PROCEDURE: The procedure, risks, benefits, and alternatives were explained to the patient and the patient's family. Specific risks that were addressed included bleeding, infection, contrast reaction, kidney injury, need for further procedure, limb loss, cardiopulmonary collapse, death. Questions regarding the procedure were encouraged and answered. The patient understands and consents to the procedure. Proximal left thigh was prepped and draped in the usual sterile fashion. The left ankle was prepped and draped in the usual sterile fashion. Ultrasound survey of the left proximal thigh was performed with images stored and sent to PACs. A micropuncture needle was used access the left superficial femoral artery under ultrasound in antegrade fashion. With excellent arterial blood flow returned, and an .018 micro wire was passed through the needle, observed to enter the SFA under fluoroscopy. The needle was removed, and a micropuncture  sheath was placed over the wire. The inner dilator and wire were removed, and an 035 Bentson wire was advanced under fluoroscopy. The sheath was removed and a standard 5 [place] vascular sheath was placed. The dilator was removed and the sheath was flushed. Angiogram was performed of the left lower extremity. Ultrasound survey of the left dorsalis pedis was performed with images stored and sent to PACs. A micropuncture needle was used to access the left dorsalis pedis artery under ultrasound. With arterial blood flow returned, and an .018 micro wire was passed through the needle, observed to enter the distal anterior tibial artery under fluoroscopy. The needle was removed, and a micropuncture sheath was placed over the wire. The inner dilator and wire were removed. A pharmacologic cocktail was infused. Stiff Glidewire was then passed through the sheath in a retrograde fashion to the anterior tibial artery, traversing the occlusion at the proximal anterior tibial artery and at the popliteal artery. The wire stayed luminal in the superficial femoral artery. The SFA sheath was then upsized to 6 [place]. A snare was used to snare the Glidewire from above. 035 quick cross catheter was then passed over the Glidewire to the distal anterior tibial artery. Down size to 018 system. Balloon angioplasty was then performed along the length of the anterior tibial artery with 2.0 mm diameter balloon, 2.5 mm diameter balloon, 3.0 mm diameter balloon. 2 mm balloon was used for hemostasis at the puncture site. Antegrade approach to the stump of the tibioperoneal trunk was used with a combination of Glidewire and quick cross catheter. Balloon angioplasty was performed with 2.5 mm diameter balloon and 3.0 mm diameter balloon at the origin, with restoration of flow. Spasm was induced in the mid and distal peroneal artery. Pedal pulse was confirmed at the conclusion of the case, which  was not present at the initiation. All catheters and  wires were removed. Sheath was removed from the superficial femoral artery, with manual pressure used for hemostasis. Patient tolerated the procedure well and remained hemodynamically stable throughout. No complications were encountered and no significant blood loss was encountered. FINDINGS: No palpable pulse at the initiation of the case. Doppler signals were present. Initial angiogram demonstrates no significant disease of the superficial femoral artery. Occlusion of the popliteal artery at the joint space of the knee. Occlusion extends through the popliteal artery, tibioperoneal trunk, and proximal anterior tibial artery. Reconstitution of the distal anterior tibial artery and of the peroneal artery. No posterior tibial filling was present. After revascularization, there is in-line flow through the popliteal artery, anterior tibial artery, and peroneal artery to the ankle. Spasm induced in the mid and distal peroneal artery, with palpable pulse at the completion of the angiogram/treatment. IMPRESSION: Status post left lower extremity angiogram with antegrade and retrograde approach to popliteal occlusion and revascularization with plain balloon angioplasty restoring in-line flow through the popliteal artery, peroneal artery and anterior tibial artery to the ankle. Signed, Yvone Neu. Loreta Ave DO [facility] Radiology PLAN: p.o. Plavix 300 mg now. Left leg straight for 5 hours. Initiate 75 mg p.o. Plavix daily. Electronically Signed   By: Gilmer Mor D.O.   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 She has a vague history of what sounds like embolism to the popliteal artery and surgical thrombectomy in [place] many years prior. Prior inpatient noninvasive study demonstrates abnormal waveforms distally, with nonpalpable pulses  of the foot. CT angiogram and runoff confirms a popliteal occlusion as well as tibial occlusions. She presents today for angiogram and attempt at revascularization for wound healing capability. EXAM: ULTRASOUND GUIDED ACCESS LEFT SUPERFICIAL FEMORAL ARTERY ULTRASOUND GUIDED ACCESS LEFT DORSALIS PEDIS REVASCULARIZATION OF POPLITEAL OCCLUSION WITH BALLOON ANGIOPLASTY OF POPLITEAL ARTERY REVASCULARIZATION OF LEFT ANTERIOR TIBIAL ARTERY WITH BALLOON ANGIOPLASTY REVASCULARIZATION OF LEFT TIBIOPERONEAL TRUNK AND PERONEAL ARTERY WITH BALLOON ANGIOPLASTY FLUOROSCOPY TIME:  25 minutes 54 seconds MEDICATIONS AND MEDICAL HISTORY: 2 mg intra-arterial tPA, 800 mcg nitroglycerin, 2.5 mg verapamil intra arterial ANESTHESIA/SEDATION: 2.5 mg Versed, 125 mcg fentanyl 180 minutes sedation time CONTRAST:  VISIPAQUE IODIXANOL 320 MG/ML IV SOLN COMPLICATIONS: Small puncture site hematoma at the left thigh.  SIR category 1. PROCEDURE: The procedure, risks, benefits, and alternatives were explained to the patient and the patient's family. Specific risks that were addressed included bleeding, infection, contrast reaction, kidney injury, need for further procedure, limb loss, cardiopulmonary collapse, death. Questions regarding the procedure were encouraged and answered. The patient understands and consents to the procedure. Proximal left thigh was prepped and draped in the usual sterile fashion. The left ankle was prepped and draped in the usual sterile fashion. Ultrasound survey of the left proximal thigh was performed with images stored and sent to PACs. A micropuncture needle was used access the left superficial femoral artery under ultrasound in antegrade fashion. With excellent arterial blood flow returned, and an .018 micro wire was passed through the needle, observed to enter the SFA under fluoroscopy. The needle was removed, and a micropuncture sheath was placed over the wire. The inner dilator and wire were removed, and an  035 Bentson wire was advanced under fluoroscopy. The sheath was removed and a standard 5 [place] vascular  sheath was placed. The dilator was removed and the sheath was flushed. Angiogram was performed of the left lower extremity. Ultrasound survey of the left dorsalis pedis was performed with images stored and sent to PACs. A micropuncture needle was used to access the left dorsalis pedis artery under ultrasound. With arterial blood flow returned, and an .018 micro wire was passed through the needle, observed to enter the distal anterior tibial artery under fluoroscopy. The needle was removed, and a micropuncture sheath was placed over the wire. The inner dilator and wire were removed. A pharmacologic cocktail was infused. Stiff Glidewire was then passed through the sheath in a retrograde fashion to the anterior tibial artery, traversing the occlusion at the proximal anterior tibial artery and at the popliteal artery. The wire stayed luminal in the superficial femoral artery. The SFA sheath was then upsized to 6 [place]. A snare was used to snare the Glidewire from above. 035 quick cross catheter was then passed over the Glidewire to the distal anterior tibial artery. Down size to 018 system. Balloon angioplasty was then performed along the length of the anterior tibial artery with 2.0 mm diameter balloon, 2.5 mm diameter balloon, 3.0 mm diameter balloon. 2 mm balloon was used for hemostasis at the puncture site. Antegrade approach to the stump of the tibioperoneal trunk was used with a combination of Glidewire and quick cross catheter. Balloon angioplasty was performed with 2.5 mm diameter balloon and 3.0 mm diameter balloon at the origin, with restoration of flow. Spasm was induced in the mid and distal peroneal artery. Pedal pulse was confirmed at the conclusion of the case, which was not present at the initiation. All catheters and wires were removed. Sheath was removed from the superficial femoral artery, with  manual pressure used for hemostasis. Patient tolerated the procedure well and remained hemodynamically stable throughout. No complications were encountered and no significant blood loss was encountered. FINDINGS: No palpable pulse at the initiation of the case. Doppler signals were present. Initial angiogram demonstrates no significant disease of the superficial femoral artery. Occlusion of the popliteal artery at the joint space of the knee. Occlusion extends through the popliteal artery, tibioperoneal trunk, and proximal anterior tibial artery. Reconstitution of the distal anterior tibial artery and of the peroneal artery. No posterior tibial filling was present. After revascularization, there is in-line flow through the popliteal artery, anterior tibial artery, and peroneal artery to the ankle. Spasm induced in the mid and distal peroneal artery, with palpable pulse at the completion of the angiogram/treatment. IMPRESSION: Status post left lower extremity angiogram with antegrade and retrograde approach to popliteal occlusion and revascularization with plain balloon angioplasty restoring in-line flow through the popliteal artery, peroneal artery and anterior tibial artery to the ankle. Signed, Yvone Neu. Loreta Ave DO [facility] Radiology PLAN: p.o. Plavix 300 mg now. Left leg straight for 5 hours. Initiate 75 mg p.o. Plavix daily. Electronically Signed   By: Gilmer Mor D.O.   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 She has a vague history of what sounds like embolism to the popliteal artery and surgical thrombectomy in [place] many years prior. Prior inpatient noninvasive study demonstrates abnormal waveforms distally, with nonpalpable pulses of the foot. CT angiogram and runoff confirms a popliteal occlusion as well as tibial  occlusions. She presents today for angiogram and attempt at revascularization  for wound healing capability. EXAM: ULTRASOUND GUIDED ACCESS LEFT SUPERFICIAL FEMORAL ARTERY ULTRASOUND GUIDED ACCESS LEFT DORSALIS PEDIS REVASCULARIZATION OF POPLITEAL OCCLUSION WITH BALLOON ANGIOPLASTY OF POPLITEAL ARTERY REVASCULARIZATION OF LEFT ANTERIOR TIBIAL ARTERY WITH BALLOON ANGIOPLASTY REVASCULARIZATION OF LEFT TIBIOPERONEAL TRUNK AND PERONEAL ARTERY WITH BALLOON ANGIOPLASTY FLUOROSCOPY TIME:  25 minutes 54 seconds MEDICATIONS AND MEDICAL HISTORY: 2 mg intra-arterial tPA, 800 mcg nitroglycerin, 2.5 mg verapamil intra arterial ANESTHESIA/SEDATION: 2.5 mg Versed, 125 mcg fentanyl 180 minutes sedation time CONTRAST:  150mL VISIPAQUE IODIXANOL 320 MG/ML IV SOLN COMPLICATIONS: Small puncture site hematoma at the left thigh.  SIR category 1. PROCEDURE: The procedure, risks, benefits, and alternatives were explained to the patient and the patient's family. Specific risks that were addressed included bleeding, infection, contrast reaction, kidney injury, need for further procedure, limb loss, cardiopulmonary collapse, death. Questions regarding the procedure were encouraged and answered. The patient understands and consents to the procedure. Proximal left thigh was prepped and draped in the usual sterile fashion. The left ankle was prepped and draped in the usual sterile fashion. Ultrasound survey of the left proximal thigh was performed with images stored and sent to PACs. A micropuncture needle was used access the left superficial femoral artery under ultrasound in antegrade fashion. With excellent arterial blood flow returned, and an .018 micro wire was passed through the needle, observed to enter the SFA under fluoroscopy. The needle was removed, and a micropuncture sheath was placed over the wire. The inner dilator and wire were removed, and an 035 Bentson wire was advanced under fluoroscopy. The sheath was removed and a standard 5  [place] vascular sheath was placed. The dilator was removed and the sheath was flushed. Angiogram was performed of the left lower extremity. Ultrasound survey of the left dorsalis pedis was performed with images stored and sent to PACs. A micropuncture needle was used to access the left dorsalis pedis artery under ultrasound. With arterial blood flow returned, and an .018 micro wire was passed through the needle, observed to enter the distal anterior tibial artery under fluoroscopy. The needle was removed, and a micropuncture sheath was placed over the wire. The inner dilator and wire were removed. A pharmacologic cocktail was infused. Stiff Glidewire was then passed through the sheath in a retrograde fashion to the anterior tibial artery, traversing the occlusion at the proximal anterior tibial artery and at the popliteal artery. The wire stayed luminal in the superficial femoral artery. The SFA sheath was then upsized to 6 [place]. A snare was used to snare the Glidewire from above. 035 quick cross catheter was then passed over the Glidewire to the distal anterior tibial artery. Down size to 018 system. Balloon angioplasty was then performed along the length of the anterior tibial artery with 2.0 mm diameter balloon, 2.5 mm diameter balloon, 3.0 mm diameter balloon. 2 mm balloon was used for hemostasis at the puncture site. Antegrade approach to the stump of the tibioperoneal trunk was used with a combination of Glidewire and quick cross catheter. Balloon angioplasty was performed with 2.5 mm diameter balloon and 3.0 mm diameter balloon at the origin, with restoration of flow. Spasm was induced in the mid and distal peroneal artery. Pedal pulse was confirmed at the conclusion of the case, which was not present at the initiation. All catheters and wires were removed. Sheath was removed from the superficial femoral artery, with manual pressure used for hemostasis. Patient tolerated the procedure well and remained  hemodynamically stable throughout. No complications were encountered and no significant blood  loss was encountered. FINDINGS: No palpable pulse at the initiation of the case. Doppler signals were present. Initial angiogram demonstrates no significant disease of the superficial femoral artery. Occlusion of the popliteal artery at the joint space of the knee. Occlusion extends through the popliteal artery, tibioperoneal trunk, and proximal anterior tibial artery. Reconstitution of the distal anterior tibial artery and of the peroneal artery. No posterior tibial filling was present. After revascularization, there is in-line flow through the popliteal artery, anterior tibial artery, and peroneal artery to the ankle. Spasm induced in the mid and distal peroneal artery, with palpable pulse at the completion of the angiogram/treatment. IMPRESSION: Status post left lower extremity angiogram with antegrade and retrograde approach to popliteal occlusion and revascularization with plain balloon angioplasty restoring in-line flow through the popliteal artery, peroneal artery and anterior tibial artery to the ankle. Signed, Yvone Neu. Loreta Ave DO [facility] Radiology PLAN: p.o. Plavix 300 mg now. Left leg straight for 5 hours. Initiate 75 mg p.o. Plavix daily. Electronically Signed   By: Gilmer Mor D.O.   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 She has a vague history of what sounds like embolism to the popliteal artery and surgical thrombectomy in [place] many years prior. Prior inpatient noninvasive study demonstrates abnormal waveforms distally, with nonpalpable pulses of the foot. CT angiogram and runoff confirms a popliteal occlusion as well as tibial occlusions. She presents today for angiogram and attempt at  revascularization for wound healing capability. EXAM: ULTRASOUND GUIDED ACCESS LEFT SUPERFICIAL FEMORAL ARTERY ULTRASOUND GUIDED ACCESS LEFT DORSALIS PEDIS REVASCULARIZATION OF POPLITEAL OCCLUSION WITH BALLOON ANGIOPLASTY OF POPLITEAL ARTERY REVASCULARIZATION OF LEFT ANTERIOR TIBIAL ARTERY WITH BALLOON ANGIOPLASTY REVASCULARIZATION OF LEFT TIBIOPERONEAL TRUNK AND PERONEAL ARTERY WITH BALLOON ANGIOPLASTY FLUOROSCOPY TIME:  25 minutes 54 seconds MEDICATIONS AND MEDICAL HISTORY: 2 mg intra-arterial tPA, 800 mcg nitroglycerin, 2.5 mg verapamil intra arterial ANESTHESIA/SEDATION: 2.5 mg Versed, 125 mcg fentanyl 180 minutes sedation time CONTRAST:  VISIPAQUE IODIXANOL 320 MG/ML IV SOLN COMPLICATIONS: Small puncture site hematoma at the left thigh.  SIR category 1. PROCEDURE: The procedure, risks, benefits, and alternatives were explained to the patient and the patient's family. Specific risks that were addressed included bleeding, infection, contrast reaction, kidney injury, need for further procedure, limb loss, cardiopulmonary collapse, death. Questions regarding the procedure were encouraged and answered. The patient understands and consents to the procedure. Proximal left thigh was prepped and draped in the usual sterile fashion. The left ankle was prepped and draped in the usual sterile fashion. Ultrasound survey of the left proximal thigh was performed with images stored and sent to PACs. A micropuncture needle was used access the left superficial femoral artery under ultrasound in antegrade fashion. With excellent arterial blood flow returned, and an .018 micro wire was passed through the needle, observed to enter the SFA under fluoroscopy. The needle was removed, and a micropuncture sheath was placed over the wire. The inner dilator and wire were removed, and an 035 Bentson wire was advanced under fluoroscopy. The sheath was removed and a standard 5 [place] vascular sheath was placed. The dilator was removed  and the sheath was flushed. Angiogram was performed of the left lower extremity. Ultrasound survey of the left dorsalis pedis was performed with images stored and sent to PACs. A micropuncture needle was used to access the  left dorsalis pedis artery under ultrasound. With arterial blood flow returned, and an .018 micro wire was passed through the needle, observed to enter the distal anterior tibial artery under fluoroscopy. The needle was removed, and a micropuncture sheath was placed over the wire. The inner dilator and wire were removed. A pharmacologic cocktail was infused. Stiff Glidewire was then passed through the sheath in a retrograde fashion to the anterior tibial artery, traversing the occlusion at the proximal anterior tibial artery and at the popliteal artery. The wire stayed luminal in the superficial femoral artery. The SFA sheath was then upsized to 6 [place]. A snare was used to snare the Glidewire from above. 035 quick cross catheter was then passed over the Glidewire to the distal anterior tibial artery. Down size to 018 system. Balloon angioplasty was then performed along the length of the anterior tibial artery with 2.0 mm diameter balloon, 2.5 mm diameter balloon, 3.0 mm diameter balloon. 2 mm balloon was used for hemostasis at the puncture site. Antegrade approach to the stump of the tibioperoneal trunk was used with a combination of Glidewire and quick cross catheter. Balloon angioplasty was performed with 2.5 mm diameter balloon and 3.0 mm diameter balloon at the origin, with restoration of flow. Spasm was induced in the mid and distal peroneal artery. Pedal pulse was confirmed at the conclusion of the case, which was not present at the initiation. All catheters and wires were removed. Sheath was removed from the superficial femoral artery, with manual pressure used for hemostasis. Patient tolerated the procedure well and remained hemodynamically stable throughout. No complications were  encountered and no significant blood loss was encountered. FINDINGS: No palpable pulse at the initiation of the case. Doppler signals were present. Initial angiogram demonstrates no significant disease of the superficial femoral artery. Occlusion of the popliteal artery at the joint space of the knee. Occlusion extends through the popliteal artery, tibioperoneal trunk, and proximal anterior tibial artery. Reconstitution of the distal anterior tibial artery and of the peroneal artery. No posterior tibial filling was present. After revascularization, there is in-line flow through the popliteal artery, anterior tibial artery, and peroneal artery to the ankle. Spasm induced in the mid and distal peroneal artery, with palpable pulse at the completion of the angiogram/treatment. IMPRESSION: Status post left lower extremity angiogram with antegrade and retrograde approach to popliteal occlusion and revascularization with plain balloon angioplasty restoring in-line flow through the popliteal artery, peroneal artery and anterior tibial artery to the ankle. Signed, Yvone Neu. Loreta Ave DO [facility] Radiology PLAN: p.o. Plavix 300 mg now. Left leg straight for 5 hours. Initiate 75 mg p.o. Plavix daily. Electronically Signed   By: Gilmer Mor D.O.   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 She has a vague history of what sounds like embolism to the popliteal artery and surgical thrombectomy in [place] many years prior. Prior inpatient noninvasive study demonstrates abnormal waveforms distally, with nonpalpable pulses of the foot. CT angiogram and runoff confirms a popliteal occlusion as well as tibial occlusions. She presents today for angiogram and attempt at revascularization for wound healing capability. EXAM: ULTRASOUND GUIDED ACCESS LEFT  SUPERFICIAL FEMORAL ARTERY ULTRASOUND GUIDED ACCESS LEFT DORSALIS PEDIS REVASCULARIZATION OF POPLITEAL OCCLUSION WITH BALLOON ANGIOPLASTY OF POPLITEAL ARTERY REVASCULARIZATION OF LEFT ANTERIOR TIBIAL ARTERY WITH BALLOON ANGIOPLASTY REVASCULARIZATION OF LEFT TIBIOPERONEAL TRUNK AND PERONEAL  ARTERY WITH BALLOON ANGIOPLASTY FLUOROSCOPY TIME:  25 minutes 54 seconds MEDICATIONS AND MEDICAL HISTORY: 2 mg intra-arterial tPA, 800 mcg nitroglycerin, 2.5 mg verapamil intra arterial ANESTHESIA/SEDATION: 2.5 mg Versed, 125 mcg fentanyl 180 minutes sedation time CONTRAST:  VISIPAQUE IODIXANOL 320 MG/ML IV SOLN COMPLICATIONS: Small puncture site hematoma at the left thigh.  SIR category 1. PROCEDURE: The procedure, risks, benefits, and alternatives were explained to the patient and the patient's family. Specific risks that were addressed included bleeding, infection, contrast reaction, kidney injury, need for further procedure, limb loss, cardiopulmonary collapse, death. Questions regarding the procedure were encouraged and answered. The patient understands and consents to the procedure. Proximal left thigh was prepped and draped in the usual sterile fashion. The left ankle was prepped and draped in the usual sterile fashion. Ultrasound survey of the left proximal thigh was performed with images stored and sent to PACs. A micropuncture needle was used access the left superficial femoral artery under ultrasound in antegrade fashion. With excellent arterial blood flow returned, and an .018 micro wire was passed through the needle, observed to enter the SFA under fluoroscopy. The needle was removed, and a micropuncture sheath was placed over the wire. The inner dilator and wire were removed, and an 035 Bentson wire was advanced under fluoroscopy. The sheath was removed and a standard 5 [place] vascular sheath was placed. The dilator was removed and the sheath was flushed. Angiogram was performed of the left lower extremity.  Ultrasound survey of the left dorsalis pedis was performed with images stored and sent to PACs. A micropuncture needle was used to access the left dorsalis pedis artery under ultrasound. With arterial blood flow returned, and an .018 micro wire was passed through the needle, observed to enter the distal anterior tibial artery under fluoroscopy. The needle was removed, and a micropuncture sheath was placed over the wire. The inner dilator and wire were removed. A pharmacologic cocktail was infused. Stiff Glidewire was then passed through the sheath in a retrograde fashion to the anterior tibial artery, traversing the occlusion at the proximal anterior tibial artery and at the popliteal artery. The wire stayed luminal in the superficial femoral artery. The SFA sheath was then upsized to 6 [place]. A snare was used to snare the Glidewire from above. 035 quick cross catheter was then passed over the Glidewire to the distal anterior tibial artery. Down size to 018 system. Balloon angioplasty was then performed along the length of the anterior tibial artery with 2.0 mm diameter balloon, 2.5 mm diameter balloon, 3.0 mm diameter balloon. 2 mm balloon was used for hemostasis at the puncture site. Antegrade approach to the stump of the tibioperoneal trunk was used with a combination of Glidewire and quick cross catheter. Balloon angioplasty was performed with 2.5 mm diameter balloon and 3.0 mm diameter balloon at the origin, with restoration of flow. Spasm was induced in the mid and distal peroneal artery. Pedal pulse was confirmed at the conclusion of the case, which was not present at the initiation. All catheters and wires were removed. Sheath was removed from the superficial femoral artery, with manual pressure used for hemostasis. Patient tolerated the procedure well and remained hemodynamically stable throughout. No complications were encountered and no significant blood loss was encountered. FINDINGS: No palpable pulse  at the initiation of the case. Doppler signals were present. Initial angiogram demonstrates no significant disease of the superficial femoral artery. Occlusion of the popliteal artery at the joint space of the knee. Occlusion extends through the  popliteal artery, tibioperoneal trunk, and proximal anterior tibial artery. Reconstitution of the distal anterior tibial artery and of the peroneal artery. No posterior tibial filling was present. After revascularization, there is in-line flow through the popliteal artery, anterior tibial artery, and peroneal artery to the ankle. Spasm induced in the mid and distal peroneal artery, with palpable pulse at the completion of the angiogram/treatment. IMPRESSION: Status post left lower extremity angiogram with antegrade and retrograde approach to popliteal occlusion and revascularization with plain balloon angioplasty restoring in-line flow through the popliteal artery, peroneal artery and anterior tibial artery to the ankle. Signed, Yvone Neu. Loreta Ave DO [facility] Radiology PLAN: p.o. Plavix 300 mg now. Left leg straight for 5 hours. Initiate 75 mg p.o. Plavix daily. Electronically Signed   By: Gilmer Mor D.O.   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 She has a vague history of what sounds like embolism to the popliteal artery and surgical thrombectomy in [place] many years prior. Prior inpatient noninvasive study demonstrates abnormal waveforms distally, with nonpalpable pulses of the foot. CT angiogram and runoff confirms a popliteal occlusion as well as tibial occlusions. She presents today for angiogram and attempt at revascularization for wound healing capability. EXAM: ULTRASOUND GUIDED ACCESS LEFT SUPERFICIAL FEMORAL ARTERY ULTRASOUND GUIDED ACCESS LEFT DORSALIS PEDIS REVASCULARIZATION  OF POPLITEAL OCCLUSION WITH BALLOON ANGIOPLASTY OF POPLITEAL ARTERY REVASCULARIZATION OF LEFT ANTERIOR TIBIAL ARTERY WITH BALLOON ANGIOPLASTY REVASCULARIZATION OF LEFT TIBIOPERONEAL TRUNK AND PERONEAL ARTERY WITH BALLOON ANGIOPLASTY FLUOROSCOPY TIME:  25 minutes 54 seconds MEDICATIONS AND MEDICAL HISTORY: 2 mg intra-arterial tPA, 800 mcg nitroglycerin, 2.5 mg verapamil intra arterial ANESTHESIA/SEDATION: 2.5 mg Versed, 125 mcg fentanyl 180 minutes sedation time CONTRAST:  VISIPAQUE IODIXANOL 320 MG/ML IV SOLN COMPLICATIONS: Small puncture site hematoma at the left thigh.  SIR category 1. PROCEDURE: The procedure, risks, benefits, and alternatives were explained to the patient and the patient's family. Specific risks that were addressed included bleeding, infection, contrast reaction, kidney injury, need for further procedure, limb loss, cardiopulmonary collapse, death. Questions regarding the procedure were encouraged and answered. The patient understands and consents to the procedure. Proximal left thigh was prepped and draped in the usual sterile fashion. The left ankle was prepped and draped in the usual sterile fashion. Ultrasound survey of the left proximal thigh was performed with images stored and sent to PACs. A micropuncture needle was used access the left superficial femoral artery under ultrasound in antegrade fashion. With excellent arterial blood flow returned, and an .018 micro wire was passed through the needle, observed to enter the SFA under fluoroscopy. The needle was removed, and a micropuncture sheath was placed over the wire. The inner dilator and wire were removed, and an 035 Bentson wire was advanced under fluoroscopy. The sheath was removed and a standard 5 [place] vascular sheath was placed. The dilator was removed and the sheath was flushed. Angiogram was performed of the left lower extremity. Ultrasound survey of the left dorsalis pedis was performed with images stored and sent to  PACs. A micropuncture needle was used to access the left dorsalis pedis artery under ultrasound. With arterial blood flow returned, and an .018 micro wire was passed through the needle, observed to enter the distal anterior tibial artery under fluoroscopy. The needle was removed, and a micropuncture sheath was placed over the wire. The inner dilator and  wire were removed. A pharmacologic cocktail was infused. Stiff Glidewire was then passed through the sheath in a retrograde fashion to the anterior tibial artery, traversing the occlusion at the proximal anterior tibial artery and at the popliteal artery. The wire stayed luminal in the superficial femoral artery. The SFA sheath was then upsized to 6 [place]. A snare was used to snare the Glidewire from above. 035 quick cross catheter was then passed over the Glidewire to the distal anterior tibial artery. Down size to 018 system. Balloon angioplasty was then performed along the length of the anterior tibial artery with 2.0 mm diameter balloon, 2.5 mm diameter balloon, 3.0 mm diameter balloon. 2 mm balloon was used for hemostasis at the puncture site. Antegrade approach to the stump of the tibioperoneal trunk was used with a combination of Glidewire and quick cross catheter. Balloon angioplasty was performed with 2.5 mm diameter balloon and 3.0 mm diameter balloon at the origin, with restoration of flow. Spasm was induced in the mid and distal peroneal artery. Pedal pulse was confirmed at the conclusion of the case, which was not present at the initiation. All catheters and wires were removed. Sheath was removed from the superficial femoral artery, with manual pressure used for hemostasis. Patient tolerated the procedure well and remained hemodynamically stable throughout. No complications were encountered and no significant blood loss was encountered. FINDINGS: No palpable pulse at the initiation of the case. Doppler signals were present. Initial angiogram  demonstrates no significant disease of the superficial femoral artery. Occlusion of the popliteal artery at the joint space of the knee. Occlusion extends through the popliteal artery, tibioperoneal trunk, and proximal anterior tibial artery. Reconstitution of the distal anterior tibial artery and of the peroneal artery. No posterior tibial filling was present. After revascularization, there is in-line flow through the popliteal artery, anterior tibial artery, and peroneal artery to the ankle. Spasm induced in the mid and distal peroneal artery, with palpable pulse at the completion of the angiogram/treatment. IMPRESSION: Status post left lower extremity angiogram with antegrade and retrograde approach to popliteal occlusion and revascularization with plain balloon angioplasty restoring in-line flow through the popliteal artery, peroneal artery and anterior tibial artery to the ankle. Signed, Yvone Neu. Loreta Ave DO [facility] Radiology PLAN: p.o. Plavix 300 mg now. Left leg straight for 5 hours. Initiate 75 mg p.o. Plavix daily. Electronically Signed   By: Gilmer Mor D.O.   On: 05/09/2015 16:00    Labs:  CBC:  Recent Labs  01/27/15 0447 02/14/15 1331 05/09/15 0719 05/10/15 0143  WBC 4.7 5.8 3.7* 17.8*  HGB 9.3* 10.1* 10.0* 7.9*  HCT 28.8* 31.3* 31.5* 24.7*  PLT 113* 199.0 172 175    COAGS:  Recent Labs  01/23/15 1246  03/26/15 04/23/15 05/09/15 0719 05/10/15 0143  INR 2.89*  < > 3.0* 2.8* 1.25 1.62*  APTT 40*  --   --   --  32  --   < > = values in this interval not displayed.  BMP:  Recent Labs  01/26/15 0250 01/27/15 0447 02/14/15 1331 04/25/15 1238 05/09/15 0719 05/10/15 0143  NA 139 138 141  --  140 140  K 3.5 3.5 3.8  --  4.2 3.8  CL 106 105 97  --  101 101  CO2 24 26 35*  --  30 23  GLUCOSE 85 81 85  --  142* 98  BUN 15 14 22   --  19 28*  CALCIUM 8.2* 8.1* 9.0  --  9.1 8.3*  CREATININE 1.04* 1.13* 1.02 1.10* 0.98  1.83*  GFRNONAA 48* 44*  --   --  52* 24*  GFRAA 56* 51*  --   --  >60 28*    LIVER FUNCTION TESTS:  Recent Labs  01/25/15 0228 01/26/15 0250 02/14/15 1331 05/10/15 0143  BILITOT 0.7 0.5 0.7 0.9  AST 41 33 21 74*  ALT 21 18 8 23   ALKPHOS 63 58 76 52  PROT 6.0* 6.1* 7.6 5.6*  ALBUMIN 2.4* 2.3* 3.3* 2.2*    Assessment and Plan:  Status post left lower extremity angiogram with antegrade and retrograde approach to popliteal occlusion and revascularization with plain balloon angioplasty restoring in-line flow through the popliteal artery, peroneal artery and anterior tibial artery to the Ankle. Fall at home last pm no change in hematoma Left groin Pulses B post tibial 1+ doppler IR aware of admission to ED Discussed with Dr Loreta Ave Await plan regarding meds per Dr Loreta Ave  Electronically Signed: Ralene Muskrat A 05/10/2015, 9:33 AM   I spent a total of 25 Minutes at the the patient's bedside AND on the patient's hospital floor or unit, greater than 50% of which was counseling/coordinating care for PTA left popliteal artery; peroneal artery; anterior tibial artery

## 2015-05-10 NOTE — Progress Notes (Signed)
ANTIBIOTIC CONSULT NOTE - INITIAL  Pharmacy Consult for Zosyn and vancomycin Indication: Sepsis  Allergies  Allergen Reactions  . Bactrim Ds [Sulfamethoxazole-Trimethoprim] Rash  . Contrast Media [Iodinated Diagnostic Agents] Hives    BLE erythema   . Ciprofloxacin Rash  . Tape Rash    Patient Measurements: TBW 57 kg  Vital Signs: Temp: 97.8 F (36.6 C) (01/13 1059) Temp Source: Oral (01/13 1059) BP: 95/50 mmHg (01/13 1059) Pulse Rate: 108 (01/13 1059) Intake/Output from previous day: 01/12 0701 - 01/13 0700 In: 1500 [I.V.:1500] Out: -  Intake/Output from this shift:    Labs:  Recent Labs  05/09/15 0719 05/10/15 0143  WBC 3.7* 17.8*  HGB 10.0* 7.9*  PLT 172 175  CREATININE 0.98 1.83*   Estimated Creatinine Clearance: 20.1 mL/min (by C-G formula based on Cr of 1.83). No results for input(s): VANCOTROUGH, VANCOPEAK, VANCORANDOM, GENTTROUGH, GENTPEAK, GENTRANDOM, TOBRATROUGH, TOBRAPEAK, TOBRARND, AMIKACINPEAK, AMIKACINTROU, AMIKACIN in the last 72 hours.   Microbiology: No results found for this or any previous visit (from the past 720 hour(s)).  Medical History: Past Medical History  Diagnosis Date  . PAD (peripheral artery disease) (HCC) 2013    left leg. sounds like she had angio-plasty   . Atrial fibrillation (HCC) 2013    [facility]. ON medication  . Hypertension   . Osteoporosis   . DVT (deep venous thrombosis) (HCC) 2013    "LLE" (08/04/2012)  . Hypothyroidism   . History of blood transfusion 2013    "related to thrombectomy" (08/04/2012)  . Anemia     "slightly" (08/04/2012)  . Arthritis     "probably minor; knees, pinky" (08/04/2012)  . Fracture of L4 vertebra (HCC)     "dx'd in 2013; can't treat til legs fixed" (08/04/2012)  . Lower extremity ulceration (HCC)   . Biventricular heart failure with reduced left ventricular function [facility](HCC)     Assessment: 82 yo F presents on 1/13 after fall. Pharmacy consulted to dose abx for sepsis.  Afebrile, WBC elevated at 17.8. SCr 1.83, CrCl ~2520ml/min.  Goal of Therapy:  Resolution of infection Vancomycin trough level 15-20 mcg/ml  Plan:  Give Zosyn 3.375g IV (30 min infusion) x 1, then start 2.25g IV Q8 Give vancomycin 1g IV x 1, then start 500mg  IV Q24 Monitor clinical picture, renal function, VT at Css F/U C&S, abx deescalation / LOT  Enzo BiNathan Marcellous Snarski, PharmD, BCPS Clinical Pharmacist Pager [phone] 05/10/2015 11:23 AM

## 2015-05-11 DIAGNOSIS — W19XXXA Unspecified fall, initial encounter: Secondary | ICD-10-CM

## 2015-05-11 DIAGNOSIS — I959 Hypotension, unspecified: Secondary | ICD-10-CM

## 2015-05-11 DIAGNOSIS — I5022 Chronic systolic (congestive) heart failure: Secondary | ICD-10-CM

## 2015-05-11 DIAGNOSIS — N179 Acute kidney failure, unspecified: Secondary | ICD-10-CM

## 2015-05-11 LAB — COMPREHENSIVE METABOLIC PANEL
ALT: 50 U/L (ref 14–54)
ANION GAP: 6 (ref 5–15)
AST: 112 U/L — ABNORMAL HIGH (ref 15–41)
Albumin: 1.9 g/dL — ABNORMAL LOW (ref 3.5–5.0)
Alkaline Phosphatase: 46 U/L (ref 38–126)
BILIRUBIN TOTAL: 2.5 mg/dL — AB (ref 0.3–1.2)
BUN: 28 mg/dL — ABNORMAL HIGH (ref 6–20)
CALCIUM: 7.9 mg/dL — AB (ref 8.9–10.3)
CO2: 25 mmol/L (ref 22–32)
Chloride: 110 mmol/L (ref 101–111)
Creatinine, Ser: 1.19 mg/dL — ABNORMAL HIGH (ref 0.44–1.00)
GFR, EST AFRICAN AMERICAN: 48 mL/min — AB (ref >60)
GFR, EST NON AFRICAN AMERICAN: 41 mL/min — AB (ref >60)
Glucose, Bld: 89 mg/dL (ref 65–99)
POTASSIUM: 3.6 mmol/L (ref 3.5–5.1)
Sodium: 141 mmol/L (ref 135–145)
TOTAL PROTEIN: 5.5 g/dL — AB (ref 6.5–8.1)

## 2015-05-11 LAB — TYPE AND SCREEN
ABO/RH(D): B NEG
ANTIBODY SCREEN: NEGATIVE
UNIT DIVISION: 0
UNIT DIVISION: 0

## 2015-05-11 LAB — PROTIME-INR
INR: 1.68 — ABNORMAL HIGH (ref 0.00–1.49)
Prothrombin Time: 19.8 seconds — ABNORMAL HIGH (ref 11.6–15.2)

## 2015-05-11 LAB — LACTIC ACID, PLASMA: LACTIC ACID, VENOUS: 2.5 mmol/L — AB (ref 0.5–2.0)

## 2015-05-11 LAB — CBC
HEMATOCRIT: 26.6 % — AB (ref 36.0–46.0)
Hemoglobin: 8.7 g/dL — ABNORMAL LOW (ref 12.0–15.0)
MCH: 28.9 pg (ref 26.0–34.0)
MCHC: 32.7 g/dL (ref 30.0–36.0)
MCV: 88.4 fL (ref 78.0–100.0)
Platelets: 146 10*3/uL — ABNORMAL LOW (ref 150–400)
RBC: 3.01 MIL/uL — ABNORMAL LOW (ref 3.87–5.11)
RDW: 17.2 % — AB (ref 11.5–15.5)
WBC: 16.6 10*3/uL — ABNORMAL HIGH (ref 4.0–10.5)

## 2015-05-11 LAB — GLUCOSE, CAPILLARY: GLUCOSE-CAPILLARY: 92 mg/dL (ref 65–99)

## 2015-05-11 LAB — HEMOGLOBIN AND HEMATOCRIT, BLOOD
HCT: 27 % — ABNORMAL LOW (ref 36.0–46.0)
HEMOGLOBIN: 8.7 g/dL — AB (ref 12.0–15.0)

## 2015-05-11 MED ORDER — WARFARIN SODIUM 5 MG PO TABS
2.5000 mg | ORAL_TABLET | Freq: Once | ORAL | Status: AC
  Administered 2015-05-11: 2.5 mg via ORAL
  Filled 2015-05-11: qty 1

## 2015-05-11 MED ORDER — PIPERACILLIN-TAZOBACTAM 3.375 G IVPB
3.3750 g | Freq: Three times a day (TID) | INTRAVENOUS | Status: DC
  Administered 2015-05-11 – 2015-05-12 (×4): 3.375 g via INTRAVENOUS
  Filled 2015-05-11 (×5): qty 50

## 2015-05-11 MED ORDER — COLLAGENASE 250 UNIT/GM EX OINT
TOPICAL_OINTMENT | Freq: Every day | CUTANEOUS | Status: DC
  Administered 2015-05-11 – 2015-05-13 (×2): via TOPICAL
  Filled 2015-05-11: qty 30

## 2015-05-11 MED ORDER — WARFARIN - PHARMACIST DOSING INPATIENT
Freq: Every day | Status: DC
  Administered 2015-05-11 – 2015-05-12 (×2)

## 2015-05-11 MED ORDER — VANCOMYCIN HCL IN DEXTROSE 750-5 MG/150ML-% IV SOLN
750.0000 mg | INTRAVENOUS | Status: DC
  Administered 2015-05-11 – 2015-05-12 (×2): 750 mg via INTRAVENOUS
  Filled 2015-05-11 (×2): qty 150

## 2015-05-11 MED ORDER — WARFARIN SODIUM 5 MG PO TABS: 5.0000 mg | ORAL_TABLET | Freq: Once | ORAL | Status: DC

## 2015-05-11 NOTE — Progress Notes (Signed)
Left lower extremity wound dressed per orders. The wound covers approximately 80% of the posterior and lateral surfaces of the LLE. Small amount of purulent drainage noted. No odor. Kristina Diaz, son, at bedside during dressing initiation. Both patient and son educated. All questions answered. Understanding verbalized. Will continue to monitor.

## 2015-05-11 NOTE — Consult Note (Signed)
WOC wound consult note Reason for Consult: LLE ulcer, reviewed chart. Patient with recent revascularization of the LLE 05/09/15.  Followed by the wound care center for weekly compression wraps, however with PAD would not continue wraps at this time while inpatient.  The LLE is warm today, weak pulse. No doppler available on the unit. No edema bilaterally. She reports the wrap used on the LLE was kerlix and coban only.  Wound type: mixed etiology full thickness ulceration LLE  Pressure Ulcer POA: No Measurement: 11cm x 4cm x 0.2cm  Wound bed:95% yellow/brown slough, 5% pink at wound edges Drainage (amount, consistency, odor) moderate, thick yellow, slight odor. Periwound: intact, no edema  Dressing procedure/placement/frequency: With recent revascularization, will now add enzymatic debridement ointment.  Will not wrap with compression due to need to assess wound on a regular basis.  Cover with saline moist gauze, wrap with kerlix only.  Patient to follow up with wound care center after DC as scheduled.   WOC will follow along with your for weekly wound assessment.   Discussed POC with patient and bedside nurse.  Re consult if needed, will not follow at this time. Thanks  Christal Lagerstrom [employer] RN, CWOCN [phone])

## 2015-05-11 NOTE — Progress Notes (Signed)
CRITICAL VALUE ALERT  Critical value received:  Lactic acid   Date of notification: 05/11/15  Time of notification: 1202  Critical value read back:Yes.    Nurse who received alert:  Benedetto GoadNicole, Charge RN  MD notified (1st page): Mikhail  Time of first page:  1216  MD notified (2nd page):  Time of second page:  Responding MD:  Dr. Catha GosselinMikhail   Time MD responded:  [phone]

## 2015-05-11 NOTE — Plan of Care (Signed)
Problem: Safety: Goal: Ability to remain free from injury will improve Patient was educated on fall prevention and safety measures. Bed alarm is on  Problem: Pain Managment: Goal: General experience of comfort will improve Patient stated pain is 3/10 generally which is an improvement from 5/10 with minimal pharmacological interventions  Problem: Skin Integrity: Goal: Risk for impaired skin integrity will decrease Wound consult per wound nurse. Medication applied and new dressing intact using aseptic technique  Problem: Nutrition: Goal: Adequate nutrition will be maintained Patient consumed approximately 100% of breakfast

## 2015-05-11 NOTE — Progress Notes (Signed)
DNR bracelet applied. Kristina JuneMatthew Soltys, son, at bedside. Placement verified by Rachael Darbyroyce Hood, RN

## 2015-05-11 NOTE — Progress Notes (Signed)
Pharmacy Antibiotic Follow-up Note  Kristina Diaz is a 82 y.o. year-old female admitted on 05/10/2015.  The patient is currently on day 2 of vanc/zosyn for sepsis.  Assessment/Plan: V/Z for  sepsis WBC 17.8>16.6, AF. Creat 1.83>>1.19 LA 2.25 Normalized creat cl ~ 40 ml/min.   1/13 vanc 1 gm infused over 2 hours 2nd red man's w/ first dose - only red skin, no SOB or itching noted  1/13 CXR neg 1/13 CT abd/pelvis: no RP hematoma   Plan: incr zosyn to 3375 q8h 4 hr infusion Incr vanc to 750 q24 Holding home coumadin    Temp (24hrs), Avg:98.2 F (36.8 C), Min:97.4 F (36.3 C), Max:99.5 F (37.5 C)   Recent Labs Lab 05/09/15 0719 05/10/15 0143 05/11/15 0330  WBC 3.7* 17.8* 16.6*    Recent Labs Lab 05/09/15 0719 05/10/15 0143 05/11/15 0330  CREATININE 0.98 1.83* 1.19*   Estimated Creatinine Clearance: 30.9 mL/min (by C-G formula based on Cr of 1.19).    Allergies  Allergen Reactions  . Bactrim Ds [Sulfamethoxazole-Trimethoprim] Rash  . Contrast Media [Iodinated Diagnostic Agents] Hives    BLE erythema   . Ciprofloxacin Rash  . Tape Rash    Antimicrobials this admission: Vanc 1/13>> Zosyn 1/13>>   Levels/dose changes this admission: none  Microbiology results:  1/13 MRSA PCR neg 1/13 BCx2>>ip   Thank you for allowing pharmacy to be a part of this patient's care.  Len ChildsBell, Sudais Banghart T PharmD 05/11/2015 9:05 AM

## 2015-05-11 NOTE — Progress Notes (Signed)
[facility]                                                                              Patient Demographics  Kristina Diaz, is a 82 y.o. female, DOB - [birth year], ZOX:[id number]  Admit date - 05/10/2015   Admitting Physician Starleen Arms, MD  Outpatient Primary MD for the patient is [name], MD  LOS - 1   Chief Complaint  Patient presents with  . Fall      HPI on 05/10/2015 by Ms. [name], Kristina Diaz is a 82 y.o. female  Who has a history of PAD, afib,HTN DVT. Pt had an arteriogram done yesterday due to occluded popliteal artery At and peroneal artery. Pt returned to Assisted living facility and became dizzy falling to the floor this am. Patients family reports no loss of conciousness however pt was confused after fall. Pt injured her right forearm. Pt has 2 skin tears treated in ED. Pt complained of pain to her low back. Ct abdomen showed no evidence of retroperitoneal bleeding, no evidence of injury from fall. Pt had a ct of her head due to fall and confusion after fall. Ct showed no evidence of acute injury Pt has had low blood pressures since arriving in ED. She has received 1500cc of fluids. Pt has an EF of 45. Pt had an echo 9/16 Pt continues to be hypotensive.Pt's hemoglobin has dropped From 10 to 7.9. She has a hemotoma to the left groin from procedure. Radiology did an ultrasound after procedure and reports no anyuerism. Pt was placed on plavix yesterday. She previously was on coumadin for afib until 5 days ago when she was asked to stop before procedure.   Assessment & Plan   Anemia secondary to blood loss/Hematoma -Secondary to "hematoma post procedural -Patient received 2uPRBCs -Hemoglobin on admission 7.9, currently 8.7 -Plavix held -Continue to monitor closely   Hypotension -likely secondary to volume depletion and anemia  -Improving, continue to monitor  Chronic systolic CHF -Echocardiogram 01/24/2015 showed EF 40-45%   -continue to monitor intake, output, daily weights, this patient received aggressive hydration upon admission   Acute kidney injury -Secondary to hypotension, likely prerenal  -creatinine upon admission 1.83, improving to 1.19 today  -Continue to monitor BMP  History of LLE DVT -2014 s/p thrombectomy -Lower extremity Doppler: Negative for DVT, superficial thrombosis, Baker's cyst   Atrial fibrillation -Currently rate controlled -CHADSVASC 8 (based on age, gender, CHF, h/o DVT, HTN, PAD) -Given patient's risk factors and CHADSVASC score, spoke with cardiology, Dr. Gala Romney, regarding [facility].  He agreed, we can restart couamdin and low dose ASA and monitor H/H closely -Will restart Coumadin per pharmacy this evening and monitor H/H closely  PAD -S/p revascularization of left popliteal artery  -interventional radiology consulted and appreciated, recommended patient use full dose aspirin for 30 days; follow up in 2-4 weeks  Hypothyroidism -Continue Synthroid   Fall -PT and OT consulted  Code Status: DNR: Spoke with patient regarding  Resuscitation, patient did not want CPR or intubation. Thiswas also confirmed when speaking to the son.  Family Communication: Non at bedside. Son via phone.  Disposition Plan: Admitted, continue to monitor in stepdown  Time Spent in minutes   30 minutes  Procedures  None  Consults   Interventional radiology Cardiology, Dr. Gala Romney, via phone  DVT Prophylaxis  Coumadin  Lab Results  Component Value Date   PLT 146* 05/11/2015    Medications  Scheduled Meds: . sodium chloride  10 mL/hr Intravenous Once  . aspirin EC  81 mg Oral Daily  . collagenase   Topical Daily  . docusate sodium  100 mg Oral Daily  . levothyroxine  37.5 mcg Oral QAC breakfast  . piperacillin-tazobactam (ZOSYN)  IV  3.375 g Intravenous 3 times per day  . potassium chloride  10 mEq Oral BID  . sodium chloride  3 mL Intravenous Q12H  . vancomycin  750 mg  Intravenous Q24H  . warfarin  5 mg Oral ONCE-1800  . Warfarin - Pharmacist Dosing Inpatient   Does not apply q1800   Continuous Infusions: . sodium chloride 125 mL/hr at 05/11/15 0950   PRN Meds:.acetaminophen **OR** acetaminophen, oxyCODONE  Antibiotics    Anti-infectives    Start     Dose/Rate Route Frequency Ordered Stop   05/11/15 1200  vancomycin (VANCOCIN) 500 mg in sodium chloride 0.9 % 100 mL IVPB  Status:  Discontinued     500 mg 100 mL/hr over 60 Minutes Intravenous Every 24 hours 05/10/15 1127 05/11/15 0900   05/11/15 1200  vancomycin (VANCOCIN) IVPB 750 mg/150 ml premix     750 mg 150 mL/hr over 60 Minutes Intravenous Every 24 hours 05/11/15 0900     05/11/15 1000  piperacillin-tazobactam (ZOSYN) IVPB 3.375 g     3.375 g 12.5 mL/hr over 240 Minutes Intravenous 3 times per day 05/11/15 0838     05/10/15 1800  piperacillin-tazobactam (ZOSYN) IVPB 2.25 g  Status:  Discontinued     2.25 g 100 mL/hr over 30 Minutes Intravenous 3 times per day 05/10/15 1127 05/11/15 0838   05/10/15 1115  piperacillin-tazobactam (ZOSYN) IVPB 3.375 g     3.375 g 100 mL/hr over 30 Minutes Intravenous  Once 05/10/15 1112 05/10/15 1207   05/10/15 1115  vancomycin (VANCOCIN) IVPB 1000 mg/200 mL premix     1,000 mg 200 mL/hr over 60 Minutes Intravenous  Once 05/10/15 1112 05/10/15 1307        Subjective:   [name] seen and examined today.  Patient has no complaints this morning. Denies chest pain, shortness of breath, abdominal pain.     Objective:   Filed Vitals:   05/11/15 0146 05/11/15 0400 05/11/15 0800 05/11/15 1213  BP:  90/46 99/45 96/52   Pulse:  105 96 104  Temp:  98.7 F (37.1 C) 97.4 F (36.3 C) 98.3 F (36.8 C)  TempSrc:  Axillary Oral Oral  Resp:  23 28 22   Height:      Weight: 63.9 kg (140 lb 14 oz)     SpO2:  97% 98% 100%    Wt Readings from Last 3 Encounters:  05/11/15 63.9 kg (140 lb 14 oz)  05/09/15 57.153 kg (126 lb)  04/02/15 56.246 kg (124 lb)      Intake/Output Summary (Last 24 hours) at 05/11/15 1332 Last data filed at 05/11/15 0945  Gross per 24 hour  Intake 2317.92 ml  Output    200 ml  Net 2117.92 ml    Exam  General: Well developed, well nourished, NAD, appears stated age  HEENT: NCAT, mucous membranes moist.   Cardiovascular: S1 S2 auscultated, irregular  Respiratory: Clear to auscultation bilaterally with equal chest rise  Abdomen: Soft, nontender, nondistended, + bowel sounds  Extremities: warm dry without cyanosis clubbing. Hematoma left groin  Neuro: AAOx3, nonfocal  Skin: Without rashes exudates or nodules- redness from IV contrast allergy  Psych: Normal affect and demeanor with intact judgement and insight  Data Review   Micro Results Recent Results (from the past 240 hour(s))  MRSA PCR Screening     Status: None   Collection Time: 05/10/15  2:57 PM  Result Value Ref Range Status   MRSA by PCR NEGATIVE NEGATIVE Final    Comment:        The GeneXpert MRSA Assay (FDA approved for NASAL specimens only), is one component of a comprehensive MRSA colonization surveillance program. It is not intended to diagnose MRSA infection nor to guide or monitor treatment for MRSA infections.     Radiology Reports Ct Abdomen Pelvis Wo Contrast  05/10/2015  CLINICAL DATA:  82 year old female with severe back pain concern for retroperitoneal hemorrhage. EXAM: CT ABDOMEN AND PELVIS WITHOUT CONTRAST TECHNIQUE: Multidetector CT imaging of the abdomen and pelvis was performed following the standard protocol without IV contrast. COMPARISON:  Radiograph dated 05/10/2015 and CT dated 04/25/2015 FINDINGS: Evaluation of this exam is limited in the absence of intravenous contrast. Evaluation is also limited due to streak artifact caused by patient's arms. Mild bibasilar linear atelectatic changes. There is cardiomegaly. There is coronary vascular calcification. No intra-abdominal free air. No free fluid. The liver  appears unremarkable. High attenuating material within the gallbladder may represent vicariously excreted contrast from recent study or small gallstones. The pancreas is not well visualized. A 1.6 cm stable appearing hypodensity is again noted in the region of the head of the pancreas similar to the prior study. The spleen appears unremarkable. The adrenal glands are not well visualized. There is stable appearing low positioning and malrotation of the right kidney. There is no hydronephrosis on either side. There is excreted contrast in the right renal pelvis and within the urinary bladder. The uterus is grossly unremarkable. There is apparent thickening of the wall of the stomach which may be related to underdistention. Gastritis is not excluded. Clinical correlation is recommended. Constipation. Dense stool within the rectum may represent a degree of fecal impaction. There is no evidence of bowel obstruction. There is atherosclerotic calcification of the aorta. No portal venous gas identified. There is no lymphadenopathy. No definite intraperitoneal or retroperitoneal hemorrhage noted. There is diffuse subcutaneous soft tissue stranding. Dilated and serpiginous collateral vasculature noted in the subcutaneous soft tissue of the right anterior abdominal wall. There is diffuse stranding of the subcutaneous tissues of the gluteal and perineal region. No fluid collection or hematoma identified. There is osteopenia. Degenerative changes of the spine. There is L2 compression fracture with near complete loss of vertebral body height and anterior wedging similar to prior study. No acute fracture. IMPRESSION: No definite retroperitoneal or intraperitoneal hematoma identified. No hematoma seen in the visualized portions of the lower extremities. Electronically Signed   By: Elgie Collard M.D.   On: 05/10/2015 06:10   Dg Chest 2 View  05/10/2015  CLINICAL DATA:  82 year old female with hypotension and heart failure.  EXAM: CHEST  2 VIEW COMPARISON:  Chest radiograph dated 01/23/2015 FINDINGS: Two views of the chest demonstrate stable cardiomegaly. No focal consolidation, pleural effusion, or pneumothorax. The osseous structures are grossly unremarkable. IMPRESSION: Stable cardiomegaly.  No acute cardiopulmonary process. Electronically Signed   By: Elgie Collard M.D.   On: 05/10/2015 06:19   Dg Lumbar Spine Complete  05/10/2015  CLINICAL DATA:  Fall, diffuse pain. EXAM: LUMBAR SPINE - COMPLETE 4+ VIEW COMPARISON:  Reformats from CT lower extremity angiogram 04/25/2015 FINDINGS: Severe compression deformity of L2 is unchanged from prior exam. There is otherwise no acute fracture. Mild anterolisthesis of L5 on S1 is unchanged from CT. Multilevel facet arthropathy is seen. Significant osteoporosis. Excreted contrast within kidneys and urinary bladder from angiogram yesterday. IMPRESSION: Chronic compression deformity of L3. No definite acute fracture. Severe osteoporosis. Electronically Signed   By: Rubye Oaks M.D.   On: 05/10/2015 03:32   Ct Head Wo Contrast  05/10/2015  CLINICAL DATA:  Lost her balance, fall to ground. Neck pain. Slurred speech. EXAM: CT HEAD WITHOUT CONTRAST CT CERVICAL SPINE WITHOUT CONTRAST TECHNIQUE: Multidetector CT imaging of the head and cervical spine was performed following the standard protocol without intravenous contrast. Multiplanar CT image reconstructions of the cervical spine were also generated. COMPARISON:  None. FINDINGS: CT HEAD FINDINGS No intracranial hemorrhage, mass effect, or midline shift. Generalized atrophy and chronic small vessel ischemia. No hydrocephalus. Portions of the skullbase are obscured by streak artifact from metallic dentures/dental hardware. The basilar cisterns are patent. No evidence of territorial infarct. No intracranial fluid collection. Calvarium is intact. Right maxillary sinuses moderate of, no paranasal sinus inflammatory change. Mild sclerosis but  inferior left mastoid air cells. CT CERVICAL SPINE FINDINGS Exaggerated cervical lordosis. Diffuse osteoporosis. Vertebral body heights are preserved. There is no fracture. The dens is intact. There are no jumped or perched facets. Disc space narrowing at C5-C6. Mild multilevel facet arthropathy. No prevertebral soft tissue edema. The esophagus appears dilated were visualized. IMPRESSION: 1. Atrophy and chronic small vessel ischemia without acute intracranial abnormality. 2. Degenerative change in osteoporosis in the cervical spine. No acute fracture or subluxation. Electronically Signed   By: Rubye Oaks M.D.   On: 05/10/2015 03:50   Ct Cervical Spine Wo Contrast  05/10/2015  CLINICAL DATA:  Lost her balance, fall to ground. Neck pain. Slurred speech. EXAM: CT HEAD WITHOUT CONTRAST CT CERVICAL SPINE WITHOUT CONTRAST TECHNIQUE: Multidetector CT imaging of the head and cervical spine was performed following the standard protocol without intravenous contrast. Multiplanar CT image reconstructions of the cervical spine were also generated. COMPARISON:  None. FINDINGS: CT HEAD FINDINGS No intracranial hemorrhage, mass effect, or midline shift. Generalized atrophy and chronic small vessel ischemia. No hydrocephalus. Portions of the skullbase are obscured by streak artifact from metallic dentures/dental hardware. The basilar cisterns are patent. No evidence of territorial infarct. No intracranial fluid collection. Calvarium is intact. Right maxillary sinuses moderate of, no paranasal sinus inflammatory change. Mild sclerosis but inferior left mastoid air cells. CT CERVICAL SPINE FINDINGS Exaggerated cervical lordosis. Diffuse osteoporosis. Vertebral body heights are preserved. There is no fracture. The dens is intact. There are no jumped or perched facets. Disc space narrowing at C5-C6. Mild multilevel facet arthropathy. No prevertebral soft tissue edema. The esophagus appears dilated were visualized. IMPRESSION:  1. Atrophy and chronic small vessel ischemia without acute intracranial abnormality. 2. Degenerative change in osteoporosis in the cervical spine. No acute fracture or subluxation. Electronically Signed   By: Rubye Oaks M.D.   On: 05/10/2015 03:50   Ir Angiogram Extremity Left  05/09/2015  CLINICAL DATA:  82 year old female with a history of chronic nonhealing wound of the left lower extremity. She has a vague history of what sounds like embolism to the popliteal artery and surgical thrombectomy in [place] many years prior. Prior inpatient noninvasive study demonstrates abnormal waveforms distally, with  nonpalpable pulses of the foot. CT angiogram and runoff confirms a popliteal occlusion as well as tibial occlusions. She presents today for angiogram and attempt at revascularization for wound healing capability. EXAM: ULTRASOUND GUIDED ACCESS LEFT SUPERFICIAL FEMORAL ARTERY ULTRASOUND GUIDED ACCESS LEFT DORSALIS PEDIS REVASCULARIZATION OF POPLITEAL OCCLUSION WITH BALLOON ANGIOPLASTY OF POPLITEAL ARTERY REVASCULARIZATION OF LEFT ANTERIOR TIBIAL ARTERY WITH BALLOON ANGIOPLASTY REVASCULARIZATION OF LEFT TIBIOPERONEAL TRUNK AND PERONEAL ARTERY WITH BALLOON ANGIOPLASTY FLUOROSCOPY TIME:  25 minutes 54 seconds MEDICATIONS AND MEDICAL HISTORY: 2 mg intra-arterial tPA, 800 mcg nitroglycerin, 2.5 mg verapamil intra arterial ANESTHESIA/SEDATION: 2.5 mg Versed, 125 mcg fentanyl 180 minutes sedation time CONTRAST:  VISIPAQUE IODIXANOL 320 MG/ML IV SOLN COMPLICATIONS: Small puncture site hematoma at the left thigh.  SIR category 1. PROCEDURE: The procedure, risks, benefits, and alternatives were explained to the patient and the patient's family. Specific risks that were addressed included bleeding, infection, contrast reaction, kidney injury, need for further procedure, limb loss, cardiopulmonary collapse, death. Questions regarding the procedure were encouraged and answered. The patient understands and consents  to the procedure. Proximal left thigh was prepped and draped in the usual sterile fashion. The left ankle was prepped and draped in the usual sterile fashion. Ultrasound survey of the left proximal thigh was performed with images stored and sent to PACs. A micropuncture needle was used access the left superficial femoral artery under ultrasound in antegrade fashion. With excellent arterial blood flow returned, and an .018 micro wire was passed through the needle, observed to enter the SFA under fluoroscopy. The needle was removed, and a micropuncture sheath was placed over the wire. The inner dilator and wire were removed, and an 035 Bentson wire was advanced under fluoroscopy. The sheath was removed and a standard 5 [place] vascular sheath was placed. The dilator was removed and the sheath was flushed. Angiogram was performed of the left lower extremity. Ultrasound survey of the left dorsalis pedis was performed with images stored and sent to PACs. A micropuncture needle was used to access the left dorsalis pedis artery under ultrasound. With arterial blood flow returned, and an .018 micro wire was passed through the needle, observed to enter the distal anterior tibial artery under fluoroscopy. The needle was removed, and a micropuncture sheath was placed over the wire. The inner dilator and wire were removed. A pharmacologic cocktail was infused. Stiff Glidewire was then passed through the sheath in a retrograde fashion to the anterior tibial artery, traversing the occlusion at the proximal anterior tibial artery and at the popliteal artery. The wire stayed luminal in the superficial femoral artery. The SFA sheath was then upsized to 6 [place]. A snare was used to snare the Glidewire from above. 035 quick cross catheter was then passed over the Glidewire to the distal anterior tibial artery. Down size to 018 system. Balloon angioplasty was then performed along the length of the anterior tibial artery with 2.0 mm  diameter balloon, 2.5 mm diameter balloon, 3.0 mm diameter balloon. 2 mm balloon was used for hemostasis at the puncture site. Antegrade approach to the stump of the tibioperoneal trunk was used with a combination of Glidewire and quick cross catheter. Balloon angioplasty was performed with 2.5 mm diameter balloon and 3.0 mm diameter balloon at the origin, with restoration of flow. Spasm was induced in the mid and distal peroneal artery. Pedal pulse was confirmed at the conclusion of the case, which was not present at the initiation. All catheters and wires were removed. Sheath was removed from the  superficial femoral artery, with manual pressure used for hemostasis. Patient tolerated the procedure well and remained hemodynamically stable throughout. No complications were encountered and no significant blood loss was encountered. FINDINGS: No palpable pulse at the initiation of the case. Doppler signals were present. Initial angiogram demonstrates no significant disease of the superficial femoral artery. Occlusion of the popliteal artery at the joint space of the knee. Occlusion extends through the popliteal artery, tibioperoneal trunk, and proximal anterior tibial artery. Reconstitution of the distal anterior tibial artery and of the peroneal artery. No posterior tibial filling was present. After revascularization, there is in-line flow through the popliteal artery, anterior tibial artery, and peroneal artery to the ankle. Spasm induced in the mid and distal peroneal artery, with palpable pulse at the completion of the angiogram/treatment. IMPRESSION: Status post left lower extremity angiogram with antegrade and retrograde approach to popliteal occlusion and revascularization with plain balloon angioplasty restoring in-line flow through the popliteal artery, peroneal artery and anterior tibial artery to the ankle. Signed, Yvone Neu. Loreta Ave DO [facility]  PLAN: p.o. Plavix 300 mg now. Left leg straight for 5 hours. Initiate 75 mg p.o. Plavix daily. Electronically Signed   By: Gilmer Mor D.O.   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 She has a vague history of what sounds like embolism to the popliteal artery and surgical thrombectomy in [place] many years prior. Prior inpatient noninvasive study demonstrates abnormal waveforms distally, with nonpalpable pulses of the foot. CT angiogram and runoff confirms a popliteal occlusion as well as tibial occlusions. She presents today for angiogram and attempt at revascularization for wound healing capability. EXAM: ULTRASOUND GUIDED ACCESS LEFT SUPERFICIAL FEMORAL ARTERY ULTRASOUND GUIDED ACCESS LEFT DORSALIS PEDIS REVASCULARIZATION OF POPLITEAL OCCLUSION WITH BALLOON ANGIOPLASTY OF POPLITEAL ARTERY REVASCULARIZATION OF LEFT ANTERIOR TIBIAL ARTERY WITH BALLOON ANGIOPLASTY REVASCULARIZATION OF LEFT TIBIOPERONEAL TRUNK AND PERONEAL ARTERY WITH BALLOON ANGIOPLASTY FLUOROSCOPY TIME:  25 minutes 54 seconds MEDICATIONS AND MEDICAL HISTORY: 2 mg intra-arterial tPA, 800 mcg nitroglycerin, 2.5 mg verapamil intra arterial ANESTHESIA/SEDATION: 2.5 mg Versed, 125 mcg fentanyl 180 minutes sedation time CONTRAST:  VISIPAQUE IODIXANOL 320 MG/ML IV SOLN COMPLICATIONS: Small puncture site hematoma at the left thigh.  SIR category 1. PROCEDURE: The procedure, risks, benefits, and alternatives were explained to the patient and the patient's family. Specific risks that were addressed included bleeding, infection, contrast reaction, kidney injury, need for further procedure, limb loss, cardiopulmonary collapse, death. Questions regarding the procedure were encouraged and answered. The patient understands and consents to the procedure. Proximal left thigh was prepped and draped in the usual sterile  fashion. The left ankle was prepped and draped in the usual sterile fashion. Ultrasound survey of the left proximal thigh was performed with images stored and sent to PACs. A micropuncture needle was used access the left superficial femoral artery under ultrasound in antegrade fashion. With excellent arterial blood flow returned, and an .018 micro wire was passed through the needle, observed to enter the SFA under fluoroscopy. The needle was removed, and a micropuncture sheath was placed over the wire. The inner dilator and wire were removed, and an 035 Bentson wire was advanced under fluoroscopy. The sheath was removed and a standard 5 [place] vascular sheath was placed. The dilator was removed and the sheath was flushed. Angiogram was performed of the left lower extremity. Ultrasound survey  of the left dorsalis pedis was performed with images stored and sent to PACs. A micropuncture needle was used to access the left dorsalis pedis artery under ultrasound. With arterial blood flow returned, and an .018 micro wire was passed through the needle, observed to enter the distal anterior tibial artery under fluoroscopy. The needle was removed, and a micropuncture sheath was placed over the wire. The inner dilator and wire were removed. A pharmacologic cocktail was infused. Stiff Glidewire was then passed through the sheath in a retrograde fashion to the anterior tibial artery, traversing the occlusion at the proximal anterior tibial artery and at the popliteal artery. The wire stayed luminal in the superficial femoral artery. The SFA sheath was then upsized to 6 [place]. A snare was used to snare the Glidewire from above. 035 quick cross catheter was then passed over the Glidewire to the distal anterior tibial artery. Down size to 018 system. Balloon angioplasty was then performed along the length of the anterior tibial artery with 2.0 mm diameter balloon, 2.5 mm diameter balloon, 3.0 mm diameter balloon. 2 mm balloon was  used for hemostasis at the puncture site. Antegrade approach to the stump of the tibioperoneal trunk was used with a combination of Glidewire and quick cross catheter. Balloon angioplasty was performed with 2.5 mm diameter balloon and 3.0 mm diameter balloon at the origin, with restoration of flow. Spasm was induced in the mid and distal peroneal artery. Pedal pulse was confirmed at the conclusion of the case, which was not present at the initiation. All catheters and wires were removed. Sheath was removed from the superficial femoral artery, with manual pressure used for hemostasis. Patient tolerated the procedure well and remained hemodynamically stable throughout. No complications were encountered and no significant blood loss was encountered. FINDINGS: No palpable pulse at the initiation of the case. Doppler signals were present. Initial angiogram demonstrates no significant disease of the superficial femoral artery. Occlusion of the popliteal artery at the joint space of the knee. Occlusion extends through the popliteal artery, tibioperoneal trunk, and proximal anterior tibial artery. Reconstitution of the distal anterior tibial artery and of the peroneal artery. No posterior tibial filling was present. After revascularization, there is in-line flow through the popliteal artery, anterior tibial artery, and peroneal artery to the ankle. Spasm induced in the mid and distal peroneal artery, with palpable pulse at the completion of the angiogram/treatment. IMPRESSION: Status post left lower extremity angiogram with antegrade and retrograde approach to popliteal occlusion and revascularization with plain balloon angioplasty restoring in-line flow through the popliteal artery, peroneal artery and anterior tibial artery to the ankle. Signed, Yvone Neu. Loreta Ave DO [facility] Radiology PLAN: p.o. Plavix 300 mg now. Left leg straight for 5 hours. Initiate 75 mg p.o. Plavix  daily. Electronically Signed   By: Gilmer Mor D.O.   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 She has a vague history of what sounds like embolism to the popliteal artery and surgical thrombectomy in [place] many years prior. Prior inpatient noninvasive study demonstrates abnormal waveforms distally, with nonpalpable pulses of the foot. CT angiogram and runoff confirms a popliteal occlusion as well as tibial occlusions. She presents today for angiogram and attempt at revascularization for wound healing capability. EXAM: ULTRASOUND GUIDED ACCESS LEFT SUPERFICIAL FEMORAL ARTERY ULTRASOUND GUIDED ACCESS LEFT DORSALIS PEDIS REVASCULARIZATION OF POPLITEAL OCCLUSION WITH  BALLOON ANGIOPLASTY OF POPLITEAL ARTERY REVASCULARIZATION OF LEFT ANTERIOR TIBIAL ARTERY WITH BALLOON ANGIOPLASTY REVASCULARIZATION OF LEFT TIBIOPERONEAL TRUNK AND PERONEAL ARTERY WITH BALLOON ANGIOPLASTY FLUOROSCOPY TIME:  25 minutes 54 seconds MEDICATIONS AND MEDICAL HISTORY: 2 mg intra-arterial tPA, 800 mcg nitroglycerin, 2.5 mg verapamil intra arterial ANESTHESIA/SEDATION: 2.5 mg Versed, 125 mcg fentanyl 180 minutes sedation time CONTRAST:  VISIPAQUE IODIXANOL 320 MG/ML IV SOLN COMPLICATIONS: Small puncture site hematoma at the left thigh.  SIR category 1. PROCEDURE: The procedure, risks, benefits, and alternatives were explained to the patient and the patient's family. Specific risks that were addressed included bleeding, infection, contrast reaction, kidney injury, need for further procedure, limb loss, cardiopulmonary collapse, death. Questions regarding the procedure were encouraged and answered. The patient understands and consents to the procedure. Proximal left thigh was prepped and draped in the usual sterile fashion. The left ankle was prepped and draped in the usual sterile fashion. Ultrasound survey  of the left proximal thigh was performed with images stored and sent to PACs. A micropuncture needle was used access the left superficial femoral artery under ultrasound in antegrade fashion. With excellent arterial blood flow returned, and an .018 micro wire was passed through the needle, observed to enter the SFA under fluoroscopy. The needle was removed, and a micropuncture sheath was placed over the wire. The inner dilator and wire were removed, and an 035 Bentson wire was advanced under fluoroscopy. The sheath was removed and a standard 5 [place] vascular sheath was placed. The dilator was removed and the sheath was flushed. Angiogram was performed of the left lower extremity. Ultrasound survey of the left dorsalis pedis was performed with images stored and sent to PACs. A micropuncture needle was used to access the left dorsalis pedis artery under ultrasound. With arterial blood flow returned, and an .018 micro wire was passed through the needle, observed to enter the distal anterior tibial artery under fluoroscopy. The needle was removed, and a micropuncture sheath was placed over the wire. The inner dilator and wire were removed. A pharmacologic cocktail was infused. Stiff Glidewire was then passed through the sheath in a retrograde fashion to the anterior tibial artery, traversing the occlusion at the proximal anterior tibial artery and at the popliteal artery. The wire stayed luminal in the superficial femoral artery. The SFA sheath was then upsized to 6 [place]. A snare was used to snare the Glidewire from above. 035 quick cross catheter was then passed over the Glidewire to the distal anterior tibial artery. Down size to 018 system. Balloon angioplasty was then performed along the length of the anterior tibial artery with 2.0 mm diameter balloon, 2.5 mm diameter balloon, 3.0 mm diameter balloon. 2 mm balloon was used for hemostasis at the puncture site. Antegrade approach to the stump of the tibioperoneal  trunk was used with a combination of Glidewire and quick cross catheter. Balloon angioplasty was performed with 2.5 mm diameter balloon and 3.0 mm diameter balloon at the origin, with restoration of flow. Spasm was induced in the mid and distal peroneal artery. Pedal pulse was confirmed at the conclusion of the case, which was not present at the initiation. All catheters and wires were removed. Sheath was removed from the superficial femoral artery, with manual pressure used for hemostasis. Patient tolerated the procedure well and remained hemodynamically stable throughout. No complications were encountered and no significant blood loss was encountered. FINDINGS: No palpable pulse at the initiation of the case. Doppler signals were present. Initial angiogram demonstrates no significant disease  of the superficial femoral artery. Occlusion of the popliteal artery at the joint space of the knee. Occlusion extends through the popliteal artery, tibioperoneal trunk, and proximal anterior tibial artery. Reconstitution of the distal anterior tibial artery and of the peroneal artery. No posterior tibial filling was present. After revascularization, there is in-line flow through the popliteal artery, anterior tibial artery, and peroneal artery to the ankle. Spasm induced in the mid and distal peroneal artery, with palpable pulse at the completion of the angiogram/treatment. IMPRESSION: Status post left lower extremity angiogram with antegrade and retrograde approach to popliteal occlusion and revascularization with plain balloon angioplasty restoring in-line flow through the popliteal artery, peroneal artery and anterior tibial artery to the ankle. Signed, Yvone Neu. Loreta Ave DO [facility] Radiology PLAN: p.o. Plavix 300 mg now. Left leg straight for 5 hours. Initiate 75 mg p.o. Plavix daily. Electronically Signed   By: Gilmer Mor D.O.   On: 05/09/2015 16:00   Ct Angio  Ao+bifem W/cm &/or Wo/cm  04/25/2015  CLINICAL DATA:  82 year old female with a history of a chronic wound of the left lower extremity. Previous noninvasive arterial examination demonstrates arterial occlusive disease. EXAM: CT ANGIOGRAPHY OF ABDOMINAL AORTA WITH ILIOFEMORAL RUNOFF TECHNIQUE: Multidetector CT imaging of the abdomen, pelvis and lower extremities was performed using the standard protocol during bolus administration of intravenous contrast. Multiplanar CT image reconstructions and MIPs were obtained to evaluate the vascular anatomy. CONTRAST:  OMNIPAQUE IOHEXOL 350 MG/ML SOLN COMPARISON:  MRI 01/23/2015 FINDINGS: Vascular: Aorta: Scattered calcified and soft plaque throughout the visualized lower thoracic and abdominal aorta. No aneurysm. There is a linear hypodensity associated with the right aspect of the aorta at the L3 level along the right aspect of the intima, potentially a chronic dissection or chronic fold given the patient's position. No periaortic fluid or inflammatory changes. Mesenteric vasculature: Calcifications at the origin of the celiac artery and superior mesenteric artery. There is likely at least 50% narrowing of the celiac artery origin though the branch vessels are patent. Single left renal artery origin is patent. Single right renal artery origin is patent, with calcifications at the origin with likely 50% stenosis. Inferior mesenteric artery origin is patent. Vein vasculature: Uncertain patency of the IVC and iliac veins, as the timing of the contrast bolus does not opacified the venous system. Right Lower Extremity: Scattered calcifications of the right iliac system. Normal course caliber and contour of the right iliac system with no aneurysm or dissection flap. Right hypogastric artery is patent including the anterior and posterior divisions. Right common femoral artery patent. Right profunda femoris is patent. Length of the superficial femoral artery is patent with no  significant atherosclerotic changes or stenosis. Popliteal artery patent. Anterior tibial artery patent from the origin to the ankle. Posterior tibial artery and peroneal artery appear patent from the origin to the ankle. Left Lower Extremity: Minimal atherosclerotic changes of the left iliac system. Normal course caliber and contour with no dissection or aneurysm. Hypogastric arteries patent including anterior posterior divisions. External iliac artery patent. Common femoral artery patent. Profunda femoris patent. Length of the superficial femoral artery is patent through the adductor canal. No significant atherosclerotic changes. The popliteal artery occludes approximately at the knee joint. There are surgical clips and surgical changes within the soft tissues at the site of occlusion of the popliteal artery. Geniculate arteries/collaterals contribute to reconstitution of the tibial artery in the proximal third and the peroneal artery in the proximal third. No significant  filling of the posterior tibial artery. Additional: Multiple tortuous venous structures in the superficial tissues of the left lower extremity. Nonvascular: Lower chest: Collateral venous drainage of the right greater than left chest wall and abdominal wall. No axillary adenopathy. Respiratory motion somewhat limits evaluation of the lung bases. No confluent airspace disease or pleural effusion. Minimal atelectasis. Heart is enlarged, with pronounced enlargement of the left atrium and right atrium. Trace calcifications of the aortic valve. Small hiatal hernia. Abdomen/pelvis: Unremarkable appearance of liver, spleen, and adrenal glands. No abnormally distended small bowel or colon. Appendix is not visualized, however, no inflammatory changes are present adjacent to the cecum to indicate an appendicitis. Low-density/cystic structure in the head of the pancreas associated with the bile ducts with greatest diameter of 17 mm. Questionable ductal  dilatation of the pancreatic duct at the pancreatic body and pancreatic tail. No free fluid.  No free intraperitoneal air. Unremarkable appearance of the urinary bladder. Unremarkable appearance of the uterus and adnexa. Symmetric enhancement of the bilateral kidneys. No nephrolithiasis. No hydronephrosis. 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 Nonspecific pelvic floor laxity. Musculoskeletal: Borderline enlarged lymph nodes of the bilateral inguinal region and of the left popliteal region, presumably reactive given the patient's known chronic wound of the left lower extremity. Diffuse osteopenia. Accentuated kyphotic curvature of the thoracic spine is evident given the positioning. Compression fracture of L2 is essentially unchanged from the plain film dated 12/15/2012. No significant bony canal narrowing. Multilevel degenerative disc disease throughout the visualized spine. Advanced degenerative changes of the L5-S1 level with associated facet disease. No acute displaced fracture. 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 Soft tissue changes of the left calf anterior and posterior, compatible with the patient's known inflammation/ wound. Surgical changes of the posterior left distal thigh and calf. Review of the MIP images confirms the above findings. IMPRESSION: Vascular: Minimal atherosclerotic changes of the visualized aorta, iliac system, and bilateral femoral popliteal system. Major finding of the left lower extremity is a popliteal artery occlusion at the level of the knee joint in the region of surgical changes, with reconstitution of the tibial artery and peroneal artery via collateral flow. No significant appreciable flow within the posterior tibial artery. Extensive venous collaterals of the abdominal and chest wall, which are more pronounced on the right side. The iliac veins and IVC are not well evaluated on the current study given the timing of the contrast bolus. If there were concern  for ilio-caval occlusion, correlation with duplex study may be useful, or alternatively cross-sectional imaging with the venous phase. Reactive lymph nodes of the left greater than right inguinal region and left popliteal region. Associated CT imaging findings of the patient's known left lower extremity chronic soft tissue wound. Cardiomegaly. Nonvascular: Cystic lesion measuring 17 mm in the head of the pancreas associated with the extrahepatic biliary ducts. Most likely cause would be a ductal dilatation in a patient of this age or a pseudocyst. Alternatively, cystic neoplasm could have this appearance, and if there were clinical concern for further evaluation, MRI could be considered as a more specific/ sensitive test of choice. Signed, Yvone Neu. Loreta Ave, DO [facility] Radiology Electronically Signed   By: Gilmer Mor D.O.   On: 04/25/2015 17:40   Ct Hip Left Wo Contrast  05/10/2015  CLINICAL DATA:  Lost her balance and fell this evening. EXAM: CT OF THE LEFT HIP WITHOUT CONTRAST TECHNIQUE: Multidetector CT imaging of the left hip was performed according to the  standard protocol. Multiplanar CT image reconstructions were also generated. COMPARISON:  None. FINDINGS: There is generalized osteopenia. There is no acute fracture or dislocation. There is mild osteoarthritis of the left hip. There is no aggressive lytic or sclerotic osseous lesion. There is enthesopathic changes at the iliopsoas insertion. Grade 1 anterolisthesis of L5 on S1. Bilateral severe facet arthropathy L5-S1. Left L5 pars defect. There is mild left inguinal lymphadenopathy. There is generalized fat stranding involving the left hip and upper thigh. There is skin thickening along the medial aspect of the upper thigh. There is no soft tissue emphysema. Mild fat stranding in the left inguinal region which may be secondary to recent catheterization. IMPRESSION: 1. No acute osseous injury of the  left hip. 2. Generalized soft tissue edema in the subcutaneous fat around the pelvis and upper thigh. 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 She has a vague history of what sounds like embolism to the popliteal artery and surgical thrombectomy in [place] many years prior. Prior inpatient noninvasive study demonstrates abnormal waveforms distally, with nonpalpable pulses of the foot. CT angiogram and runoff confirms a popliteal occlusion as well as tibial occlusions. She presents today for angiogram and attempt at revascularization for wound healing capability. EXAM: ULTRASOUND GUIDED ACCESS LEFT SUPERFICIAL FEMORAL ARTERY ULTRASOUND GUIDED ACCESS LEFT DORSALIS PEDIS REVASCULARIZATION OF POPLITEAL OCCLUSION WITH BALLOON ANGIOPLASTY OF POPLITEAL ARTERY REVASCULARIZATION OF LEFT ANTERIOR TIBIAL ARTERY WITH BALLOON ANGIOPLASTY REVASCULARIZATION OF LEFT TIBIOPERONEAL TRUNK AND PERONEAL ARTERY WITH BALLOON ANGIOPLASTY FLUOROSCOPY TIME:  25 minutes 54 seconds MEDICATIONS AND MEDICAL HISTORY: 2 mg intra-arterial tPA, 800 mcg nitroglycerin, 2.5 mg verapamil intra arterial ANESTHESIA/SEDATION: 2.5 mg Versed, 125 mcg fentanyl 180 minutes sedation time CONTRAST:  VISIPAQUE IODIXANOL 320 MG/ML IV SOLN COMPLICATIONS: Small puncture site hematoma at the left thigh.  SIR category 1. PROCEDURE: The procedure, risks, benefits, and alternatives were explained to the patient and the patient's family. Specific risks that were addressed included bleeding, infection, contrast reaction, kidney injury, need for further procedure, limb loss, cardiopulmonary collapse, death. Questions regarding the procedure were encouraged and answered. The patient understands and consents to the procedure. Proximal left thigh was prepped and draped in the usual  sterile fashion. The left ankle was prepped and draped in the usual sterile fashion. Ultrasound survey of the left proximal thigh was performed with images stored and sent to PACs. A micropuncture needle was used access the left superficial femoral artery under ultrasound in antegrade fashion. With excellent arterial blood flow returned, and an .018 micro wire was passed through the needle, observed to enter the SFA under fluoroscopy. The needle was removed, and a micropuncture sheath was placed over the wire. The inner dilator and wire were removed, and an 035 Bentson wire was advanced under fluoroscopy. The sheath was removed and a standard 5 [place] vascular sheath was placed. The dilator was removed and the sheath was flushed. Angiogram was performed of the left lower extremity. Ultrasound survey of the left dorsalis pedis was performed with images stored and sent to PACs. A micropuncture needle was used to access the left dorsalis pedis artery under ultrasound. With arterial blood flow returned, and an .018 micro wire was passed through the needle, observed to enter the distal anterior tibial artery under fluoroscopy. The needle was removed, and  a micropuncture sheath was placed over the wire. The inner dilator and wire were removed. A pharmacologic cocktail was infused. Stiff Glidewire was then passed through the sheath in a retrograde fashion to the anterior tibial artery, traversing the occlusion at the proximal anterior tibial artery and at the popliteal artery. The wire stayed luminal in the superficial femoral artery. The SFA sheath was then upsized to 6 [place]. A snare was used to snare the Glidewire from above. 035 quick cross catheter was then passed over the Glidewire to the distal anterior tibial artery. Down size to 018 system. Balloon angioplasty was then performed along the length of the anterior tibial artery with 2.0 mm diameter balloon, 2.5 mm diameter balloon, 3.0 mm diameter balloon. 2 mm  balloon was used for hemostasis at the puncture site. Antegrade approach to the stump of the tibioperoneal trunk was used with a combination of Glidewire and quick cross catheter. Balloon angioplasty was performed with 2.5 mm diameter balloon and 3.0 mm diameter balloon at the origin, with restoration of flow. Spasm was induced in the mid and distal peroneal artery. Pedal pulse was confirmed at the conclusion of the case, which was not present at the initiation. All catheters and wires were removed. Sheath was removed from the superficial femoral artery, with manual pressure used for hemostasis. Patient tolerated the procedure well and remained hemodynamically stable throughout. No complications were encountered and no significant blood loss was encountered. FINDINGS: No palpable pulse at the initiation of the case. Doppler signals were present. Initial angiogram demonstrates no significant disease of the superficial femoral artery. Occlusion of the popliteal artery at the joint space of the knee. Occlusion extends through the popliteal artery, tibioperoneal trunk, and proximal anterior tibial artery. Reconstitution of the distal anterior tibial artery and of the peroneal artery. No posterior tibial filling was present. After revascularization, there is in-line flow through the popliteal artery, anterior tibial artery, and peroneal artery to the ankle. Spasm induced in the mid and distal peroneal artery, with palpable pulse at the completion of the angiogram/treatment. IMPRESSION: Status post left lower extremity angiogram with antegrade and retrograde approach to popliteal occlusion and revascularization with plain balloon angioplasty restoring in-line flow through the popliteal artery, peroneal artery and anterior tibial artery to the ankle. Signed, Yvone Neu. Loreta Ave DO [facility] Radiology PLAN: p.o. Plavix 300 mg now. Left leg straight for 5 hours. Initiate 75 mg  p.o. Plavix daily. Electronically Signed   By: Gilmer Mor D.O.   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 She has a vague history of what sounds like embolism to the popliteal artery and surgical thrombectomy in [place] many years prior. Prior inpatient noninvasive study demonstrates abnormal waveforms distally, with nonpalpable pulses of the foot. CT angiogram and runoff confirms a popliteal occlusion as well as tibial occlusions. She presents today for angiogram and attempt at revascularization for wound healing capability. EXAM: ULTRASOUND GUIDED ACCESS LEFT SUPERFICIAL FEMORAL ARTERY ULTRASOUND GUIDED ACCESS LEFT DORSALIS PEDIS REVASCULARIZATION OF POPLITEAL OCCLUSION WITH BALLOON ANGIOPLASTY OF POPLITEAL ARTERY REVASCULARIZATION OF LEFT ANTERIOR TIBIAL ARTERY WITH BALLOON ANGIOPLASTY REVASCULARIZATION OF LEFT TIBIOPERONEAL TRUNK AND PERONEAL ARTERY WITH BALLOON ANGIOPLASTY FLUOROSCOPY TIME:  25 minutes 54 seconds MEDICATIONS AND MEDICAL HISTORY: 2 mg intra-arterial tPA, 800 mcg nitroglycerin, 2.5 mg verapamil intra arterial ANESTHESIA/SEDATION: 2.5 mg Versed, 125 mcg fentanyl 180 minutes  sedation time CONTRAST:  VISIPAQUE IODIXANOL 320 MG/ML IV SOLN COMPLICATIONS: Small puncture site hematoma at the left thigh.  SIR category 1. PROCEDURE: The procedure, risks, benefits, and alternatives were explained to the patient and the patient's family. Specific risks that were addressed included bleeding, infection, contrast reaction, kidney injury, need for further procedure, limb loss, cardiopulmonary collapse, death. Questions regarding the procedure were encouraged and answered. The patient understands and consents to the procedure. Proximal left thigh was prepped and draped in the usual sterile fashion. The left ankle was prepped and draped in the usual sterile fashion.  Ultrasound survey of the left proximal thigh was performed with images stored and sent to PACs. A micropuncture needle was used access the left superficial femoral artery under ultrasound in antegrade fashion. With excellent arterial blood flow returned, and an .018 micro wire was passed through the needle, observed to enter the SFA under fluoroscopy. The needle was removed, and a micropuncture sheath was placed over the wire. The inner dilator and wire were removed, and an 035 Bentson wire was advanced under fluoroscopy. The sheath was removed and a standard 5 [place] vascular sheath was placed. The dilator was removed and the sheath was flushed. Angiogram was performed of the left lower extremity. Ultrasound survey of the left dorsalis pedis was performed with images stored and sent to PACs. A micropuncture needle was used to access the left dorsalis pedis artery under ultrasound. With arterial blood flow returned, and an .018 micro wire was passed through the needle, observed to enter the distal anterior tibial artery under fluoroscopy. The needle was removed, and a micropuncture sheath was placed over the wire. The inner dilator and wire were removed. A pharmacologic cocktail was infused. Stiff Glidewire was then passed through the sheath in a retrograde fashion to the anterior tibial artery, traversing the occlusion at the proximal anterior tibial artery and at the popliteal artery. The wire stayed luminal in the superficial femoral artery. The SFA sheath was then upsized to 6 [place]. A snare was used to snare the Glidewire from above. 035 quick cross catheter was then passed over the Glidewire to the distal anterior tibial artery. Down size to 018 system. Balloon angioplasty was then performed along the length of the anterior tibial artery with 2.0 mm diameter balloon, 2.5 mm diameter balloon, 3.0 mm diameter balloon. 2 mm balloon was used for hemostasis at the puncture site. Antegrade approach to the stump of  the tibioperoneal trunk was used with a combination of Glidewire and quick cross catheter. Balloon angioplasty was performed with 2.5 mm diameter balloon and 3.0 mm diameter balloon at the origin, with restoration of flow. Spasm was induced in the mid and distal peroneal artery. Pedal pulse was confirmed at the conclusion of the case, which was not present at the initiation. All catheters and wires were removed. Sheath was removed from the superficial femoral artery, with manual pressure used for hemostasis. Patient tolerated the procedure well and remained hemodynamically stable throughout. No complications were encountered and no significant blood loss was encountered. FINDINGS: No palpable pulse at the initiation of the case. Doppler signals were present. Initial angiogram demonstrates no significant disease of the superficial femoral artery. Occlusion of the popliteal artery at the joint space of the knee. Occlusion extends through the popliteal artery, tibioperoneal trunk, and proximal anterior tibial artery. Reconstitution of the distal anterior tibial artery and of the peroneal artery. No posterior tibial filling was present. After revascularization, there is in-line flow through the popliteal  artery, anterior tibial artery, and peroneal artery to the ankle. Spasm induced in the mid and distal peroneal artery, with palpable pulse at the completion of the angiogram/treatment. IMPRESSION: Status post left lower extremity angiogram with antegrade and retrograde approach to popliteal occlusion and revascularization with plain balloon angioplasty restoring in-line flow through the popliteal artery, peroneal artery and anterior tibial artery to the ankle. Signed, Yvone Neu. Loreta Ave DO [facility] PLAN: p.o. Plavix 300 mg now. Left leg straight for 5 hours. Initiate 75 mg p.o. Plavix daily. Electronically Signed   By: Gilmer Mor D.O.   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 She has a vague history of what sounds like embolism to the popliteal artery and surgical thrombectomy in [place] many years prior. Prior inpatient noninvasive study demonstrates abnormal waveforms distally, with nonpalpable pulses of the foot. CT angiogram and runoff confirms a popliteal occlusion as well as tibial occlusions. She presents today for angiogram and attempt at revascularization for wound healing capability. EXAM: ULTRASOUND GUIDED ACCESS LEFT SUPERFICIAL FEMORAL ARTERY ULTRASOUND GUIDED ACCESS LEFT DORSALIS PEDIS REVASCULARIZATION OF POPLITEAL OCCLUSION WITH BALLOON ANGIOPLASTY OF POPLITEAL ARTERY REVASCULARIZATION OF LEFT ANTERIOR TIBIAL ARTERY WITH BALLOON ANGIOPLASTY REVASCULARIZATION OF LEFT TIBIOPERONEAL TRUNK AND PERONEAL ARTERY WITH BALLOON ANGIOPLASTY FLUOROSCOPY TIME:  25 minutes 54 seconds MEDICATIONS AND MEDICAL HISTORY: 2 mg intra-arterial tPA, 800 mcg nitroglycerin, 2.5 mg verapamil intra arterial ANESTHESIA/SEDATION: 2.5 mg Versed, 125 mcg fentanyl 180 minutes sedation time CONTRAST:  VISIPAQUE IODIXANOL 320 MG/ML IV SOLN COMPLICATIONS: Small puncture site hematoma at the left thigh.  SIR category 1. PROCEDURE: The procedure, risks, benefits, and alternatives were explained to the patient and the patient's family. Specific risks that were addressed included bleeding, infection, contrast reaction, kidney injury, need for further procedure, limb loss, cardiopulmonary collapse, death. Questions regarding the procedure were encouraged and answered. The patient understands and consents to the procedure. Proximal left thigh was prepped and draped in the usual sterile fashion. The left ankle was prepped and draped in the usual sterile fashion. Ultrasound survey of the left proximal thigh was performed with images stored  and sent to PACs. A micropuncture needle was used access the left superficial femoral artery under ultrasound in antegrade fashion. With excellent arterial blood flow returned, and an .018 micro wire was passed through the needle, observed to enter the SFA under fluoroscopy. The needle was removed, and a micropuncture sheath was placed over the wire. The inner dilator and wire were removed, and an 035 Bentson wire was advanced under fluoroscopy. The sheath was removed and a standard 5 [place] vascular sheath was placed. The dilator was removed and the sheath was flushed. Angiogram was performed of the left lower extremity. Ultrasound survey of the left dorsalis pedis was performed with images stored and sent to PACs. A micropuncture needle was used to access the left dorsalis pedis artery under ultrasound. With arterial blood flow returned, and an .018 micro wire was passed through the needle, observed to enter the distal anterior tibial artery under fluoroscopy. The needle was removed, and a micropuncture sheath was placed over the wire. The inner dilator and wire were removed. A pharmacologic cocktail was infused. Stiff Glidewire was then passed through the sheath in a retrograde fashion to the anterior tibial artery, traversing the occlusion at the proximal  anterior tibial artery and at the popliteal artery. The wire stayed luminal in the superficial femoral artery. The SFA sheath was then upsized to 6 [place]. A snare was used to snare the Glidewire from above. 035 quick cross catheter was then passed over the Glidewire to the distal anterior tibial artery. Down size to 018 system. Balloon angioplasty was then performed along the length of the anterior tibial artery with 2.0 mm diameter balloon, 2.5 mm diameter balloon, 3.0 mm diameter balloon. 2 mm balloon was used for hemostasis at the puncture site. Antegrade approach to the stump of the tibioperoneal trunk was used with a combination of Glidewire and quick  cross catheter. Balloon angioplasty was performed with 2.5 mm diameter balloon and 3.0 mm diameter balloon at the origin, with restoration of flow. Spasm was induced in the mid and distal peroneal artery. Pedal pulse was confirmed at the conclusion of the case, which was not present at the initiation. All catheters and wires were removed. Sheath was removed from the superficial femoral artery, with manual pressure used for hemostasis. Patient tolerated the procedure well and remained hemodynamically stable throughout. No complications were encountered and no significant blood loss was encountered. FINDINGS: No palpable pulse at the initiation of the case. Doppler signals were present. Initial angiogram demonstrates no significant disease of the superficial femoral artery. Occlusion of the popliteal artery at the joint space of the knee. Occlusion extends through the popliteal artery, tibioperoneal trunk, and proximal anterior tibial artery. Reconstitution of the distal anterior tibial artery and of the peroneal artery. No posterior tibial filling was present. After revascularization, there is in-line flow through the popliteal artery, anterior tibial artery, and peroneal artery to the ankle. Spasm induced in the mid and distal peroneal artery, with palpable pulse at the completion of the angiogram/treatment. IMPRESSION: Status post left lower extremity angiogram with antegrade and retrograde approach to popliteal occlusion and revascularization with plain balloon angioplasty restoring in-line flow through the popliteal artery, peroneal artery and anterior tibial artery to the ankle. Signed, Yvone Neu. Loreta Ave DO [facility] PLAN: p.o. Plavix 300 mg now. Left leg straight for 5 hours. Initiate 75 mg p.o. Plavix daily. Electronically Signed   By: Gilmer Mor D.O.   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 She has a vague history of what sounds like embolism to the popliteal artery and surgical thrombectomy in [place] many years prior. Prior inpatient noninvasive study demonstrates abnormal waveforms distally, with nonpalpable pulses of the foot. CT angiogram and runoff confirms a popliteal occlusion as well as tibial occlusions. She presents today for angiogram and attempt at revascularization for wound healing capability. EXAM: ULTRASOUND GUIDED ACCESS LEFT SUPERFICIAL FEMORAL ARTERY ULTRASOUND GUIDED ACCESS LEFT DORSALIS PEDIS REVASCULARIZATION OF POPLITEAL OCCLUSION WITH BALLOON ANGIOPLASTY OF POPLITEAL ARTERY REVASCULARIZATION OF LEFT ANTERIOR TIBIAL ARTERY WITH BALLOON ANGIOPLASTY REVASCULARIZATION OF LEFT TIBIOPERONEAL TRUNK AND PERONEAL ARTERY WITH BALLOON ANGIOPLASTY FLUOROSCOPY TIME:  25 minutes 54 seconds MEDICATIONS AND MEDICAL HISTORY: 2 mg intra-arterial tPA, 800 mcg nitroglycerin, 2.5 mg verapamil intra arterial ANESTHESIA/SEDATION: 2.5 mg Versed, 125 mcg fentanyl 180 minutes sedation time CONTRAST:  VISIPAQUE IODIXANOL 320 MG/ML IV SOLN COMPLICATIONS: Small puncture site hematoma at the left thigh.  SIR category 1. PROCEDURE: The procedure, risks, benefits, and alternatives were explained to the patient and the patient's family. Specific risks that  were addressed included bleeding, infection, contrast reaction, kidney injury, need for further procedure, limb loss, cardiopulmonary collapse, death. Questions regarding the procedure were encouraged and answered. The patient understands and consents to the procedure. Proximal left thigh was prepped and draped in the usual sterile fashion. The left ankle was prepped and draped in the usual sterile fashion. Ultrasound survey of the left proximal thigh was performed with images stored and sent to PACs. A micropuncture needle was used access the left superficial femoral  artery under ultrasound in antegrade fashion. With excellent arterial blood flow returned, and an .018 micro wire was passed through the needle, observed to enter the SFA under fluoroscopy. The needle was removed, and a micropuncture sheath was placed over the wire. The inner dilator and wire were removed, and an 035 Bentson wire was advanced under fluoroscopy. The sheath was removed and a standard 5 [place] vascular sheath was placed. The dilator was removed and the sheath was flushed. Angiogram was performed of the left lower extremity. Ultrasound survey of the left dorsalis pedis was performed with images stored and sent to PACs. A micropuncture needle was used to access the left dorsalis pedis artery under ultrasound. With arterial blood flow returned, and an .018 micro wire was passed through the needle, observed to enter the distal anterior tibial artery under fluoroscopy. The needle was removed, and a micropuncture sheath was placed over the wire. The inner dilator and wire were removed. A pharmacologic cocktail was infused. Stiff Glidewire was then passed through the sheath in a retrograde fashion to the anterior tibial artery, traversing the occlusion at the proximal anterior tibial artery and at the popliteal artery. The wire stayed luminal in the superficial femoral artery. The SFA sheath was then upsized to 6 [place]. A snare was used to snare the Glidewire from above. 035 quick cross catheter was then passed over the Glidewire to the distal anterior tibial artery. Down size to 018 system. Balloon angioplasty was then performed along the length of the anterior tibial artery with 2.0 mm diameter balloon, 2.5 mm diameter balloon, 3.0 mm diameter balloon. 2 mm balloon was used for hemostasis at the puncture site. Antegrade approach to the stump of the tibioperoneal trunk was used with a combination of Glidewire and quick cross catheter. Balloon angioplasty was performed with 2.5 mm diameter balloon and 3.0 mm  diameter balloon at the origin, with restoration of flow. Spasm was induced in the mid and distal peroneal artery. Pedal pulse was confirmed at the conclusion of the case, which was not present at the initiation. All catheters and wires were removed. Sheath was removed from the superficial femoral artery, with manual pressure used for hemostasis. Patient tolerated the procedure well and remained hemodynamically stable throughout. No complications were encountered and no significant blood loss was encountered. FINDINGS: No palpable pulse at the initiation of the case. Doppler signals were present. Initial angiogram demonstrates no significant disease of the superficial femoral artery. Occlusion of the popliteal artery at the joint space of the knee. Occlusion extends through the popliteal artery, tibioperoneal trunk, and proximal anterior tibial artery. Reconstitution of the distal anterior tibial artery and of the peroneal artery. No posterior tibial filling was present. After revascularization, there is in-line flow through the popliteal artery, anterior tibial artery, and peroneal artery to the ankle. Spasm induced in the mid and distal peroneal artery, with palpable pulse at the completion of the angiogram/treatment. IMPRESSION: Status post left lower extremity angiogram with antegrade and retrograde approach to popliteal occlusion  and revascularization with plain balloon angioplasty restoring in-line flow through the popliteal artery, peroneal artery and anterior tibial artery to the ankle. Signed, Yvone Neu. Loreta Ave DO [facility] PLAN: p.o. Plavix 300 mg now. Left leg straight for 5 hours. Initiate 75 mg p.o. Plavix daily. Electronically Signed   By: Gilmer Mor D.O.   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 She has a  vague history of what sounds like embolism to the popliteal artery and surgical thrombectomy in [place] many years prior. Prior inpatient noninvasive study demonstrates abnormal waveforms distally, with nonpalpable pulses of the foot. CT angiogram and runoff confirms a popliteal occlusion as well as tibial occlusions. She presents today for angiogram and attempt at revascularization for wound healing capability. EXAM: ULTRASOUND GUIDED ACCESS LEFT SUPERFICIAL FEMORAL ARTERY ULTRASOUND GUIDED ACCESS LEFT DORSALIS PEDIS REVASCULARIZATION OF POPLITEAL OCCLUSION WITH BALLOON ANGIOPLASTY OF POPLITEAL ARTERY REVASCULARIZATION OF LEFT ANTERIOR TIBIAL ARTERY WITH BALLOON ANGIOPLASTY REVASCULARIZATION OF LEFT TIBIOPERONEAL TRUNK AND PERONEAL ARTERY WITH BALLOON ANGIOPLASTY FLUOROSCOPY TIME:  25 minutes 54 seconds MEDICATIONS AND MEDICAL HISTORY: 2 mg intra-arterial tPA, 800 mcg nitroglycerin, 2.5 mg verapamil intra arterial ANESTHESIA/SEDATION: 2.5 mg Versed, 125 mcg fentanyl 180 minutes sedation time CONTRAST:  VISIPAQUE IODIXANOL 320 MG/ML IV SOLN COMPLICATIONS: Small puncture site hematoma at the left thigh.  SIR category 1. PROCEDURE: The procedure, risks, benefits, and alternatives were explained to the patient and the patient's family. Specific risks that were addressed included bleeding, infection, contrast reaction, kidney injury, need for further procedure, limb loss, cardiopulmonary collapse, death. Questions regarding the procedure were encouraged and answered. The patient understands and consents to the procedure. Proximal left thigh was prepped and draped in the usual sterile fashion. The left ankle was prepped and draped in the usual sterile fashion. Ultrasound survey of the left proximal thigh was performed with images stored and sent to PACs. A micropuncture needle was used access the left superficial femoral artery under ultrasound in antegrade fashion. With excellent arterial blood flow returned, and  an .018 micro wire was passed through the needle, observed to enter the SFA under fluoroscopy. The needle was removed, and a micropuncture sheath was placed over the wire. The inner dilator and wire were removed, and an 035 Bentson wire was advanced under fluoroscopy. The sheath was removed and a standard 5 [place] vascular sheath was placed. The dilator was removed and the sheath was flushed. Angiogram was performed of the left lower extremity. Ultrasound survey of the left dorsalis pedis was performed with images stored and sent to PACs. A micropuncture needle was used to access the left dorsalis pedis artery under ultrasound. With arterial blood flow returned, and an .018 micro wire was passed through the needle, observed to enter the distal anterior tibial artery under fluoroscopy. The needle was removed, and a micropuncture sheath was placed over the wire. The inner dilator and wire were removed. A pharmacologic cocktail was infused. Stiff Glidewire was then passed through the sheath in a retrograde fashion to the anterior tibial artery, traversing the occlusion at the proximal anterior tibial artery and at the popliteal artery. The wire stayed luminal in the superficial femoral artery. The SFA sheath was then upsized to 6 [place]. A snare was used to snare the Glidewire from above. 035 quick cross catheter was then passed over the Glidewire to the  distal anterior tibial artery. Down size to 018 system. Balloon angioplasty was then performed along the length of the anterior tibial artery with 2.0 mm diameter balloon, 2.5 mm diameter balloon, 3.0 mm diameter balloon. 2 mm balloon was used for hemostasis at the puncture site. Antegrade approach to the stump of the tibioperoneal trunk was used with a combination of Glidewire and quick cross catheter. Balloon angioplasty was performed with 2.5 mm diameter balloon and 3.0 mm diameter balloon at the origin, with restoration of flow. Spasm was induced in the mid and  distal peroneal artery. Pedal pulse was confirmed at the conclusion of the case, which was not present at the initiation. All catheters and wires were removed. Sheath was removed from the superficial femoral artery, with manual pressure used for hemostasis. Patient tolerated the procedure well and remained hemodynamically stable throughout. No complications were encountered and no significant blood loss was encountered. FINDINGS: No palpable pulse at the initiation of the case. Doppler signals were present. Initial angiogram demonstrates no significant disease of the superficial femoral artery. Occlusion of the popliteal artery at the joint space of the knee. Occlusion extends through the popliteal artery, tibioperoneal trunk, and proximal anterior tibial artery. Reconstitution of the distal anterior tibial artery and of the peroneal artery. No posterior tibial filling was present. After revascularization, there is in-line flow through the popliteal artery, anterior tibial artery, and peroneal artery to the ankle. Spasm induced in the mid and distal peroneal artery, with palpable pulse at the completion of the angiogram/treatment. IMPRESSION: Status post left lower extremity angiogram with antegrade and retrograde approach to popliteal occlusion and revascularization with plain balloon angioplasty restoring in-line flow through the popliteal artery, peroneal artery and anterior tibial artery to the ankle. Signed, Yvone Neu. Loreta Ave DO [facility] Rad[facility] PLAN: p.o. Plavix 300 mg now. Left leg straight for 5 hours. Initiate 75 mg p.o. Plavix daily. Electronically Signed   By: Gilmer Mor D.O.   On: 05/09/2015 16:00    CBC  Recent Labs Lab 05/09/15 0719 05/10/15 0143 05/10/15 2322 05/11/15 0330  WBC 3.7* 17.8*  --  16.6*  HGB 10.0* 7.9* 8.7* 8.7*  HCT 31.5* 24.7* 27.0* 26.6*  PLT 172 175  --  146*  MCV 92.4 91.5  --  88.4  MCH 29.3 29.3  --  28.9    MCHC 31.7 32.0  --  32.7  RDW 15.7* 15.8*  --  17.2*  LYMPHSABS  --  0.5*  --   --   MONOABS  --  0.2  --   --   EOSABS  --  0.0  --   --   BASOSABS  --  0.0  --   --     Chemistries   Recent Labs Lab 05/09/15 0719 05/10/15 0143 05/11/15 0330  NA 140 140 141  K 4.2 3.8 3.6  CL 101 101 110  CO2 30 23 25   GLUCOSE 142* 98 89  BUN 19 28* 28*  CREATININE 0.98 1.83* 1.19*  CALCIUM 9.1 8.3* 7.9*  AST  --  74* 112*  ALT  --  23 50  ALKPHOS  --  52 46  BILITOT  --  0.9 2.5*   ------------------------------------------------------------------------------------------------------------------ estimated creatinine clearance is 30.9 mL/min (by C-G formula based on Cr of 1.19). ------------------------------------------------------------------------------------------------------------------ No results for input(s): HGBA1C in the last 72 hours. ------------------------------------------------------------------------------------------------------------------ No results for input(s): CHOL, HDL, LDLCALC, TRIG, CHOLHDL, LDLDIRECT in the last 72 hours. ------------------------------------------------------------------------------------------------------------------ No results for input(s):  TSH, T4TOTAL, T3FREE, THYROIDAB in the last 72 hours.  Invalid input(s): FREET3 ------------------------------------------------------------------------------------------------------------------ No results for input(s): VITAMINB12, FOLATE, FERRITIN, TIBC, IRON, RETICCTPCT in the last 72 hours.  Coagulation profile  Recent Labs Lab 05/09/15 0719 05/10/15 0143 05/11/15 1100  INR 1.25 1.62* 1.68*    No results for input(s): DDIMER in the last 72 hours.  Cardiac Enzymes No results for input(s): CKMB, TROPONINI, MYOGLOBIN in the last 168 hours.  Invalid input(s): CK ------------------------------------------------------------------------------------------------------------------ Invalid input(s):  POCBNP    Sarayah Bacchi D.O. on 05/11/2015 at 1:32 PM  Between 7am to 7pm - Pager - [phone]  After 7pm go to [email or web address] - password [email or web address]  And look for the night coverage person covering for me after hours  [facility] Office  [phone]

## 2015-05-11 NOTE — Care Management (Signed)
[facility].  Thank you. Ayesha RumpfMary Yonjof RN BSN [facility]. [phone]

## 2015-05-11 NOTE — Progress Notes (Addendum)
ANTICOAGULATION CONSULT NOTE - Initial Consult  Pharmacy Consult for coumadin Indication: atrial fibrillation  Allergies  Allergen Reactions  . Bactrim Ds [Sulfamethoxazole-Trimethoprim] Rash  . Contrast Media [Iodinated Diagnostic Agents] Hives    BLE erythema   . Ciprofloxacin Rash  . Tape Rash    Patient Measurements: Height: 5\' 4"  (162.6 cm) Weight: 140 lb 14 oz (63.9 kg) IBW/kg (Calculated) : 54.7   Vital Signs: Temp: 98.3 F (36.8 C) (01/14 1213) Temp Source: Oral (01/14 1213) BP: 96/52 mmHg (01/14 1213) Pulse Rate: 104 (01/14 1213)  Labs:  Recent Labs  05/09/15 0719 05/10/15 0143 05/10/15 2322 05/11/15 0330 05/11/15 1100  HGB 10.0* 7.9* 8.7* 8.7*  --   HCT 31.5* 24.7* 27.0* 26.6*  --   PLT 172 175  --  146*  --   APTT 32  --   --   --   --   LABPROT 15.9* 19.3*  --   --  19.8*  INR 1.25 1.62*  --   --  1.68*  CREATININE 0.98 1.83*  --  1.19*  --     Estimated Creatinine Clearance: 30.9 mL/min (by C-G formula based on Cr of 1.19).   Medical History: Past Medical History  Diagnosis Date  . PAD (peripheral artery disease) (HCC) 2013    left leg. sounds like she had angio-plasty   . Atrial fibrillation (HCC) 2013    [facility] medication  . Hypertension   . Osteoporosis   . DVT (deep venous thrombosis) (HCC) 2013    "LLE" (08/04/2012)  . Hypothyroidism   . History of blood transfusion 2013    "related to thrombectomy" (08/04/2012)  . Anemia     "slightly" (08/04/2012)  . Arthritis     "probably minor; knees, pinky" (08/04/2012)  . Fracture of L4 vertebra (HCC)     "dx'd in 2013; can't treat til legs fixed" (08/04/2012)  . Lower extremity ulceration (HCC)   . Biventricular heart failure with reduced left ventricular function [facility](HCC)    Assessment: 82 yo F admitted s/p fall at home.   Coum PTA, INR today is subtherapeutic at 1.68. Hg 7.9>>8.7 p 2 units PRBCs; plct 172>175>146. Coum clinic note 12/27 INR 2.8, continue 4.5 dailyx 3 mg  [facility]p revasc chronic L pop artery occlusion, small hematoma L SFA access site. US 1/12 neg pseudoaneurysm S/p fall 1/13 after discharged home from short stay [employer] warf/plavix doppler 1/13 neg for DVT She says she has not taken any coumadin since last Saturday 1/7.   Goal of Therapy:  INR 2-3   Plan:  Coumadin 2.5 mg po x 1 Daily INR  Herby AbrahamMichelle T. Dawson Hollman, Pharm.D. [phone] 05/11/2015 1:28 PM

## 2015-05-11 NOTE — Discharge Instructions (Signed)
Information about your medication: Anticoagulant ° °Generic Name (Brand): Warfarin (Coumadin) ° °Warfarin is used to treat a blood clot or a medical condition that can cause an increased risk of forming blood clots. ° °Common SIDE EFFECTS you may experience include: increased risk of bleeding ° °Multiple drug-drug/food interactions exist for this medication.  Consult a healthcare professional prior to starting a new drug. Patients should consume approximately the same amount of Vitamin K daily. ° °Tell your physicians and dentists that you are taking this drug before elective surgery or invasive procedures and before any new drug is prescribed. ° °Contact your health care provider if you experience: any signs of severe bleeding, unusual bruising for unknown reasons, or if you have a serious fall/hit your head. °

## 2015-05-12 DIAGNOSIS — I482 Chronic atrial fibrillation: Secondary | ICD-10-CM

## 2015-05-12 LAB — BASIC METABOLIC PANEL
ANION GAP: 10 (ref 5–15)
BUN: 18 mg/dL (ref 6–20)
CALCIUM: 8.3 mg/dL — AB (ref 8.9–10.3)
CO2: 20 mmol/L — ABNORMAL LOW (ref 22–32)
CREATININE: 0.95 mg/dL (ref 0.44–1.00)
Chloride: 114 mmol/L — ABNORMAL HIGH (ref 101–111)
GFR calc Af Amer: >60 mL/min (ref >60)
GFR, EST NON AFRICAN AMERICAN: 54 mL/min — AB (ref >60)
GLUCOSE: 87 mg/dL (ref 65–99)
Potassium: 3.8 mmol/L (ref 3.5–5.1)
Sodium: 144 mmol/L (ref 135–145)

## 2015-05-12 LAB — PROTIME-INR
INR: 1.32 (ref 0.00–1.49)
Prothrombin Time: 16.5 seconds — ABNORMAL HIGH (ref 11.6–15.2)

## 2015-05-12 LAB — CBC
HCT: 27.8 % — ABNORMAL LOW (ref 36.0–46.0)
Hemoglobin: 9 g/dL — ABNORMAL LOW (ref 12.0–15.0)
MCH: 29 pg (ref 26.0–34.0)
MCHC: 32.4 g/dL (ref 30.0–36.0)
MCV: 89.7 fL (ref 78.0–100.0)
PLATELETS: 138 10*3/uL — AB (ref 150–400)
RBC: 3.1 MIL/uL — ABNORMAL LOW (ref 3.87–5.11)
RDW: 17.5 % — AB (ref 11.5–15.5)
WBC: 11.9 10*3/uL — AB (ref 4.0–10.5)

## 2015-05-12 LAB — GLUCOSE, CAPILLARY
Glucose-Capillary: 79 mg/dL (ref 65–99)
Glucose-Capillary: 75 mg/dL (ref 65–99)

## 2015-05-12 MED ORDER — COLLAGENASE 250 UNIT/GM EX OINT
1.0000 "application " | TOPICAL_OINTMENT | CUTANEOUS | Status: DC
  Administered 2015-05-12: 1 via TOPICAL
  Filled 2015-05-12: qty 30

## 2015-05-12 MED ORDER — LORATADINE 10 MG PO TABS
10.0000 mg | ORAL_TABLET | Freq: Every day | ORAL | Status: DC
  Administered 2015-05-12 – 2015-05-13 (×2): 10 mg via ORAL
  Filled 2015-05-12 (×2): qty 1

## 2015-05-12 MED ORDER — ADULT MULTIVITAMIN W/MINERALS CH
1.0000 | ORAL_TABLET | Freq: Every morning | ORAL | Status: DC
  Administered 2015-05-12 – 2015-05-13 (×2): 1 via ORAL
  Filled 2015-05-12 (×2): qty 1

## 2015-05-12 MED ORDER — METOPROLOL SUCCINATE 12.5 MG HALF TABLET
12.5000 mg | ORAL_TABLET | Freq: Every day | ORAL | Status: DC
  Administered 2015-05-12 – 2015-05-13 (×2): 12.5 mg via ORAL
  Filled 2015-05-12 (×2): qty 1

## 2015-05-12 MED ORDER — WARFARIN SODIUM 5 MG PO TABS
5.0000 mg | ORAL_TABLET | Freq: Once | ORAL | Status: AC
  Administered 2015-05-12: 5 mg via ORAL
  Filled 2015-05-12: qty 1

## 2015-05-12 MED ORDER — TORSEMIDE 20 MG PO TABS
20.0000 mg | ORAL_TABLET | Freq: Every day | ORAL | Status: DC
  Administered 2015-05-12 – 2015-05-13 (×2): 20 mg via ORAL
  Filled 2015-05-12 (×2): qty 1

## 2015-05-12 NOTE — Progress Notes (Signed)
[facility]                                                                              Patient Demographics  Kristina Diaz, is a 82 y.o. female, DOB - 31-Dec-1929, ZOX:[id number]  Admit date - 05/10/2015   Admitting Physician Starleen Arms, MD  Outpatient Primary MD for the patient is [name], MD  LOS - 2   Chief Complaint  Patient presents with  . Fall      HPI on 05/10/2015 by Ms. [name], [place] Kristina Diaz is a 82 y.o. female  Who has a history of PAD, afib,HTN DVT. Pt had an arteriogram done yesterday due to occluded popliteal artery At and peroneal artery. Pt returned to Assisted living facility and became dizzy falling to the floor this am. Patients family reports no loss of conciousness however pt was confused after fall. Pt injured her right forearm. Pt has 2 skin tears treated in ED. Pt complained of pain to her low back. Ct abdomen showed no evidence of retroperitoneal bleeding, no evidence of injury from fall. Pt had a ct of her head due to fall and confusion after fall. Ct showed no evidence of acute injury Pt has had low blood pressures since arriving in ED. She has received 1500cc of fluids. Pt has an EF of 45. Pt had an echo 9/16 Pt continues to be hypotensive.Pt's hemoglobin has dropped From 10 to 7.9. She has a hemotoma to the left groin from procedure. Radiology did an ultrasound after procedure and reports no anyuerism. Pt was placed on plavix yesterday. She previously was on coumadin for afib until 5 days ago when she was asked to stop before procedure.   Assessment & Plan   Anemia secondary to blood loss/Hematoma/Symptomatic anemia -Secondary to hematoma post procedural -Patient received 2uPRBCs -Hemoglobin on admission 7.9, currently 9.0 -Plavix held, coumadin restarted along with Aspirin 81mg  daily -Continue to monitor closely   Hypotension -likely secondary to volume depletion and anemia  -Improving, continue to  monitor  Chronic systolic CHF -Echocardiogram 01/24/2015 showed EF 40-45%  -continue to monitor intake, output, daily weights -Will restart torsemide 20mg  daily  Acute kidney injury -Secondary to hypotension, likely prerenal  -creatinine upon admission 1.83, improving to 0.95 today  -Continue to monitor BMP  History of LLE DVT -2014 s/p thrombectomy -Lower extremity Doppler: Negative for DVT, superficial thrombosis, Baker's cyst   Atrial fibrillation -CHADSVASC 8 (based on age, gender, CHF, h/o DVT, HTN, PAD) -Given patient's risk factors and CHADSVASC score, spoke with cardiology, Dr. Gala Romney, regarding [facility].  He agreed, we can restart couamdin and low dose ASA and monitor H/H closely -Continue coumadin per pharmacy -INR 1.32 today -Will restart metoprolol for rate control  PAD -S/p revascularization of left popliteal artery  -interventional radiology consulted and appreciated, recommended patient use full dose aspirin for 30 days; follow up in 2-4 weeks  Hypothyroidism -Continue Synthroid   Fall -PT and OT consulted  Code Status: DNR  Family Communication: Non at bedside. Son via phone.  Disposition Plan: Admitted, continue to monitor in stepdown  Time Spent in minutes   30 minutes  Procedures  None  Consults   Interventional  radiology Cardiology, Dr. Gala RomneyBensimhon, via phone  DVT Prophylaxis  Coumadin  Lab Results  Component Value Date   PLT 138* 05/12/2015    Medications  Scheduled Meds: . sodium chloride  10 mL/hr Intravenous Once  . aspirin EC  81 mg Oral Daily  . collagenase   Topical Daily  . docusate sodium  100 mg Oral Daily  . levothyroxine  37.5 mcg Oral QAC breakfast  . piperacillin-tazobactam (ZOSYN)  IV  3.375 g Intravenous 3 times per day  . potassium chloride  10 mEq Oral BID  . sodium chloride  3 mL Intravenous Q12H  . vancomycin  750 mg Intravenous Q24H  . Warfarin - Pharmacist Dosing Inpatient   Does not apply q1800   Continuous  Infusions: . sodium chloride 125 mL/hr at 05/12/15 0422   PRN Meds:.acetaminophen **OR** acetaminophen, oxyCODONE  Antibiotics    Anti-infectives    Start     Dose/Rate Route Frequency Ordered Stop   05/11/15 1200  vancomycin (VANCOCIN) 500 mg in sodium chloride 0.9 % 100 mL IVPB  Status:  Discontinued     500 mg 100 mL/hr over 60 Minutes Intravenous Every 24 hours 05/10/15 1127 05/11/15 0900   05/11/15 1200  vancomycin (VANCOCIN) IVPB 750 mg/150 ml premix     750 mg 150 mL/hr over 60 Minutes Intravenous Every 24 hours 05/11/15 0900     05/11/15 1000  piperacillin-tazobactam (ZOSYN) IVPB 3.375 g     3.375 g 12.5 mL/hr over 240 Minutes Intravenous 3 times per day 05/11/15 0838     05/10/15 1800  piperacillin-tazobactam (ZOSYN) IVPB 2.25 g  Status:  Discontinued     2.25 g 100 mL/hr over 30 Minutes Intravenous 3 times per day 05/10/15 1127 05/11/15 0838   05/10/15 1115  piperacillin-tazobactam (ZOSYN) IVPB 3.375 g     3.375 g 100 mL/hr over 30 Minutes Intravenous  Once 05/10/15 1112 05/10/15 1207   05/10/15 1115  vancomycin (VANCOCIN) IVPB 1000 mg/200 mL premix     1,000 mg 200 mL/hr over 60 Minutes Intravenous  Once 05/10/15 1112 05/10/15 1307      Subjective:   Kristina NoelNancy Hipwell seen and examined today.  Patient has no specific complaints this morning.  Denies chest pain, shortness of breath, abdominal pain, dizziness, or headache.   Objective:   Filed Vitals:   05/12/15 0052 05/12/15 0400 05/12/15 0418 05/12/15 0758  BP: 92/50 91/49  112/53  Pulse: 113 90  108  Temp: 97.9 F (36.6 C) 98.3 F (36.8 C)  97.7 F (36.5 C)  TempSrc: Oral Oral  Oral  Resp: 17 23  18   Height:      Weight:   67.6 kg (149 lb 0.5 oz)   SpO2: 97% 96%  99%    Wt Readings from Last 3 Encounters:  05/12/15 67.6 kg (149 lb 0.5 oz)  05/09/15 57.153 kg (126 lb)  04/02/15 56.246 kg (124 lb)     Intake/Output Summary (Last 24 hours) at 05/12/15 1035 Last data filed at 05/12/15 0700  Gross per 24  hour  Intake   3425 ml  Output    950 ml  Net   2475 ml    Exam  General: Well developed, well nourished, NAD  HEENT: NCAT, mucous membranes moist.   Cardiovascular: S1 S2 auscultated, irregular, +SEM  Respiratory: Clear to auscultation bilaterally   Abdomen: Soft, nontender, nondistended, + bowel sounds  Extremities: warm dry without cyanosis clubbing. Hematoma/mild ecchymosis left groin  Neuro: AAOx3, nonfocal  Skin: Without  rashes exudates or nodules- redness from IV contrast allergy  Psych: Normal affect and demeanor  Data Review   Micro Results Recent Results (from the past 240 hour(s))  Culture, blood (Routine X 2) w Reflex to ID Panel     Status: None (Preliminary result)   Collection Time: 05/10/15  8:45 AM  Result Value Ref Range Status   Specimen Description BLOOD LEFT ANTECUBITAL  Final   Special Requests BOTTLES DRAWN AEROBIC ONLY 5CCS  Final   Culture NO GROWTH 1 DAY  Final   Report Status PENDING  Incomplete  Culture, blood (Routine X 2) w Reflex to ID Panel     Status: None (Preliminary result)   Collection Time: 05/10/15  8:48 AM  Result Value Ref Range Status   Specimen Description BLOOD LEFT FOREARM  Final   Special Requests BOTTLES DRAWN AEROBIC AND ANAEROBIC 5CCS  Final   Culture NO GROWTH 1 DAY  Final   Report Status PENDING  Incomplete  MRSA PCR Screening     Status: None   Collection Time: 05/10/15  2:57 PM  Result Value Ref Range Status   MRSA by PCR NEGATIVE NEGATIVE Final    Comment:        The GeneXpert MRSA Assay (FDA approved for NASAL specimens only), is one component of a comprehensive MRSA colonization surveillance program. It is not intended to diagnose MRSA infection nor to guide or monitor treatment for MRSA infections.     Radiology Reports Ct Abdomen Pelvis Wo Contrast  05/10/2015  CLINICAL DATA:  82 year old female with severe back pain concern for retroperitoneal hemorrhage. EXAM: CT ABDOMEN AND PELVIS WITHOUT  CONTRAST TECHNIQUE: Multidetector CT imaging of the abdomen and pelvis was performed following the standard protocol without IV contrast. COMPARISON:  Radiograph dated 05/10/2015 and CT dated 04/25/2015 FINDINGS: Evaluation of this exam is limited in the absence of intravenous contrast. Evaluation is also limited due to streak artifact caused by patient's arms. Mild bibasilar linear atelectatic changes. There is cardiomegaly. There is coronary vascular calcification. No intra-abdominal free air. No free fluid. The liver appears unremarkable. High attenuating material within the gallbladder may represent vicariously excreted contrast from recent study or small gallstones. The pancreas is not well visualized. A 1.6 cm stable appearing hypodensity is again noted in the region of the head of the pancreas similar to the prior study. The spleen appears unremarkable. The adrenal glands are not well visualized. There is stable appearing low positioning and malrotation of the right kidney. There is no hydronephrosis on either side. There is excreted contrast in the right renal pelvis and within the urinary bladder. The uterus is grossly unremarkable. There is apparent thickening of the wall of the stomach which may be related to underdistention. Gastritis is not excluded. Clinical correlation is recommended. Constipation. Dense stool within the rectum may represent a degree of fecal impaction. There is no evidence of bowel obstruction. There is atherosclerotic calcification of the aorta. No portal venous gas identified. There is no lymphadenopathy. No definite intraperitoneal or retroperitoneal hemorrhage noted. There is diffuse subcutaneous soft tissue stranding. Dilated and serpiginous collateral vasculature noted in the subcutaneous soft tissue of the right anterior abdominal wall. There is diffuse stranding of the subcutaneous tissues of the gluteal and perineal region. No fluid collection or hematoma identified. There  is osteopenia. Degenerative changes of the spine. There is L2 compression fracture with near complete loss of vertebral body height and anterior wedging similar to prior study. No acute fracture. IMPRESSION: No definite  retroperitoneal or intraperitoneal hematoma identified. No hematoma seen in the visualized portions of the lower extremities. Electronically Signed   By: Elgie Collard M.D.   On: 05/10/2015 06:10   Dg Chest 2 View  05/10/2015  CLINICAL DATA:  82 year old female with hypotension and heart failure. EXAM: CHEST  2 VIEW COMPARISON:  Chest radiograph dated 01/23/2015 FINDINGS: Two views of the chest demonstrate stable cardiomegaly. No focal consolidation, pleural effusion, or pneumothorax. The osseous structures are grossly unremarkable. IMPRESSION: Stable cardiomegaly.  No acute cardiopulmonary process. Electronically Signed   By: Elgie Collard M.D.   On: 05/10/2015 06:19   Dg Lumbar Spine Complete  05/10/2015  CLINICAL DATA:  Fall, diffuse pain. EXAM: LUMBAR SPINE - COMPLETE 4+ VIEW COMPARISON:  Reformats from CT lower extremity angiogram 04/25/2015 FINDINGS: Severe compression deformity of L2 is unchanged from prior exam. There is otherwise no acute fracture. Mild anterolisthesis of L5 on S1 is unchanged from CT. Multilevel facet arthropathy is seen. Significant osteoporosis. Excreted contrast within kidneys and urinary bladder from angiogram yesterday. IMPRESSION: Chronic compression deformity of L3. No definite acute fracture. Severe osteoporosis. Electronically Signed   By: Rubye Oaks M.D.   On: 05/10/2015 03:32   Ct Head Wo Contrast  05/10/2015  CLINICAL DATA:  Lost her balance, fall to ground. Neck pain. Slurred speech. EXAM: CT HEAD WITHOUT CONTRAST CT CERVICAL SPINE WITHOUT CONTRAST TECHNIQUE: Multidetector CT imaging of the head and cervical spine was performed following the standard protocol without intravenous contrast. Multiplanar CT image reconstructions of the cervical  spine were also generated. COMPARISON:  None. FINDINGS: CT HEAD FINDINGS No intracranial hemorrhage, mass effect, or midline shift. Generalized atrophy and chronic small vessel ischemia. No hydrocephalus. Portions of the skullbase are obscured by streak artifact from metallic dentures/dental hardware. The basilar cisterns are patent. No evidence of territorial infarct. No intracranial fluid collection. Calvarium is intact. Right maxillary sinuses moderate of, no paranasal sinus inflammatory change. Mild sclerosis but inferior left mastoid air cells. CT CERVICAL SPINE FINDINGS Exaggerated cervical lordosis. Diffuse osteoporosis. Vertebral body heights are preserved. There is no fracture. The dens is intact. There are no jumped or perched facets. Disc space narrowing at C5-C6. Mild multilevel facet arthropathy. No prevertebral soft tissue edema. The esophagus appears dilated were visualized. IMPRESSION: 1. Atrophy and chronic small vessel ischemia without acute intracranial abnormality. 2. Degenerative change in osteoporosis in the cervical spine. No acute fracture or subluxation. Electronically Signed   By: Rubye Oaks M.D.   On: 05/10/2015 03:50   Ct Cervical Spine Wo Contrast  05/10/2015  CLINICAL DATA:  Lost her balance, fall to ground. Neck pain. Slurred speech. EXAM: CT HEAD WITHOUT CONTRAST CT CERVICAL SPINE WITHOUT CONTRAST TECHNIQUE: Multidetector CT imaging of the head and cervical spine was performed following the standard protocol without intravenous contrast. Multiplanar CT image reconstructions of the cervical spine were also generated. COMPARISON:  None. FINDINGS: CT HEAD FINDINGS No intracranial hemorrhage, mass effect, or midline shift. Generalized atrophy and chronic small vessel ischemia. No hydrocephalus. Portions of the skullbase are obscured by streak artifact from metallic dentures/dental hardware. The basilar cisterns are patent. No evidence of territorial infarct. No intracranial fluid  collection. Calvarium is intact. Right maxillary sinuses moderate of, no paranasal sinus inflammatory change. Mild sclerosis but inferior left mastoid air cells. CT CERVICAL SPINE FINDINGS Exaggerated cervical lordosis. Diffuse osteoporosis. Vertebral body heights are preserved. There is no fracture. The dens is intact. There are no jumped or perched facets. Disc space narrowing at C5-C6. Mild  multilevel facet arthropathy. No prevertebral soft tissue edema. The esophagus appears dilated were visualized. IMPRESSION: 1. Atrophy and chronic small vessel ischemia without acute intracranial abnormality. 2. Degenerative change in osteoporosis in the cervical spine. No acute fracture or subluxation. Electronically Signed   By: Rubye Oaks M.D.   On: 05/10/2015 03:50   Ir Angiogram Extremity Left  05/09/2015  CLINICAL DATA:  82 year old female with a history of chronic nonhealing wound of the left lower extremity. She has a vague history of what sounds like embolism to the popliteal artery and surgical thrombectomy in [place] many years prior. Prior inpatient noninvasive study demonstrates abnormal waveforms distally, with nonpalpable pulses of the foot. CT angiogram and runoff confirms a popliteal occlusion as well as tibial occlusions. She presents today for angiogram and attempt at revascularization for wound healing capability. EXAM: ULTRASOUND GUIDED ACCESS LEFT SUPERFICIAL FEMORAL ARTERY ULTRASOUND GUIDED ACCESS LEFT DORSALIS PEDIS REVASCULARIZATION OF POPLITEAL OCCLUSION WITH BALLOON ANGIOPLASTY OF POPLITEAL ARTERY REVASCULARIZATION OF LEFT ANTERIOR TIBIAL ARTERY WITH BALLOON ANGIOPLASTY REVASCULARIZATION OF LEFT TIBIOPERONEAL TRUNK AND PERONEAL ARTERY WITH BALLOON ANGIOPLASTY FLUOROSCOPY TIME:  25 minutes 54 seconds MEDICATIONS AND MEDICAL HISTORY: 2 mg intra-arterial tPA, 800 mcg nitroglycerin, 2.5 mg verapamil intra arterial ANESTHESIA/SEDATION: 2.5 mg Versed, 125 mcg fentanyl 180 minutes sedation time  CONTRAST:  VISIPAQUE IODIXANOL 320 MG/ML IV SOLN COMPLICATIONS: Small puncture site hematoma at the left thigh.  SIR category 1. PROCEDURE: The procedure, risks, benefits, and alternatives were explained to the patient and the patient's family. Specific risks that were addressed included bleeding, infection, contrast reaction, kidney injury, need for further procedure, limb loss, cardiopulmonary collapse, death. Questions regarding the procedure were encouraged and answered. The patient understands and consents to the procedure. Proximal left thigh was prepped and draped in the usual sterile fashion. The left ankle was prepped and draped in the usual sterile fashion. Ultrasound survey of the left proximal thigh was performed with images stored and sent to PACs. A micropuncture needle was used access the left superficial femoral artery under ultrasound in antegrade fashion. With excellent arterial blood flow returned, and an .018 micro wire was passed through the needle, observed to enter the SFA under fluoroscopy. The needle was removed, and a micropuncture sheath was placed over the wire. The inner dilator and wire were removed, and an 035 Bentson wire was advanced under fluoroscopy. The sheath was removed and a standard 5 [place] vascular sheath was placed. The dilator was removed and the sheath was flushed. Angiogram was performed of the left lower extremity. Ultrasound survey of the left dorsalis pedis was performed with images stored and sent to PACs. A micropuncture needle was used to access the left dorsalis pedis artery under ultrasound. With arterial blood flow returned, and an .018 micro wire was passed through the needle, observed to enter the distal anterior tibial artery under fluoroscopy. The needle was removed, and a micropuncture sheath was placed over the wire. The inner dilator and wire were removed. A pharmacologic cocktail was infused. Stiff Glidewire was then passed through the sheath in a  retrograde fashion to the anterior tibial artery, traversing the occlusion at the proximal anterior tibial artery and at the popliteal artery. The wire stayed luminal in the superficial femoral artery. The SFA sheath was then upsized to 6 [place]. A snare was used to snare the Glidewire from above. 035 quick cross catheter was then passed over the Glidewire to the distal anterior tibial artery. Down size to 018 system. Balloon angioplasty was then performed  along the length of the anterior tibial artery with 2.0 mm diameter balloon, 2.5 mm diameter balloon, 3.0 mm diameter balloon. 2 mm balloon was used for hemostasis at the puncture site. Antegrade approach to the stump of the tibioperoneal trunk was used with a combination of Glidewire and quick cross catheter. Balloon angioplasty was performed with 2.5 mm diameter balloon and 3.0 mm diameter balloon at the origin, with restoration of flow. Spasm was induced in the mid and distal peroneal artery. Pedal pulse was confirmed at the conclusion of the case, which was not present at the initiation. All catheters and wires were removed. Sheath was removed from the superficial femoral artery, with manual pressure used for hemostasis. Patient tolerated the procedure well and remained hemodynamically stable throughout. No complications were encountered and no significant blood loss was encountered. FINDINGS: No palpable pulse at the initiation of the case. Doppler signals were present. Initial angiogram demonstrates no significant disease of the superficial femoral artery. Occlusion of the popliteal artery at the joint space of the knee. Occlusion extends through the popliteal artery, tibioperoneal trunk, and proximal anterior tibial artery. Reconstitution of the distal anterior tibial artery and of the peroneal artery. No posterior tibial filling was present. After revascularization, there is in-line flow through the popliteal artery, anterior tibial artery, and peroneal  artery to the ankle. Spasm induced in the mid and distal peroneal artery, with palpable pulse at the completion of the angiogram/treatment. IMPRESSION: Status post left lower extremity angiogram with antegrade and retrograde approach to popliteal occlusion and revascularization with plain balloon angioplasty restoring in-line flow through the popliteal artery, peroneal artery and anterior tibial artery to the ankle. Signed, Yvone Neu. Loreta Ave DO [facility] Radiology PLAN: p.o. Plavix 300 mg now. Left leg straight for 5 hours. Initiate 75 mg p.o. Plavix daily. Electronically Signed   By: Gilmer Mor D.O.   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 She has a vague history of what sounds like embolism to the popliteal artery and surgical thrombectomy in [place] many years prior. Prior inpatient noninvasive study demonstrates abnormal waveforms distally, with nonpalpable pulses of the foot. CT angiogram and runoff confirms a popliteal occlusion as well as tibial occlusions. She presents today for angiogram and attempt at revascularization for wound healing capability. EXAM: ULTRASOUND GUIDED ACCESS LEFT SUPERFICIAL FEMORAL ARTERY ULTRASOUND GUIDED ACCESS LEFT DORSALIS PEDIS REVASCULARIZATION OF POPLITEAL OCCLUSION WITH BALLOON ANGIOPLASTY OF POPLITEAL ARTERY REVASCULARIZATION OF LEFT ANTERIOR TIBIAL ARTERY WITH BALLOON ANGIOPLASTY REVASCULARIZATION OF LEFT TIBIOPERONEAL TRUNK AND PERONEAL ARTERY WITH BALLOON ANGIOPLASTY FLUOROSCOPY TIME:  25 minutes 54 seconds MEDICATIONS AND MEDICAL HISTORY: 2 mg intra-arterial tPA, 800 mcg nitroglycerin, 2.5 mg verapamil intra arterial ANESTHESIA/SEDATION: 2.5 mg Versed, 125 mcg fentanyl 180 minutes sedation time CONTRAST:  VISIPAQUE IODIXANOL 320 MG/ML IV SOLN COMPLICATIONS: Small puncture site  hematoma at the left thigh.  SIR category 1. PROCEDURE: The procedure, risks, benefits, and alternatives were explained to the patient and the patient's family. Specific risks that were addressed included bleeding, infection, contrast reaction, kidney injury, need for further procedure, limb loss, cardiopulmonary collapse, death. Questions regarding the procedure were encouraged and answered. The patient understands and consents to the procedure. Proximal left thigh was prepped and draped in the usual sterile fashion. The left ankle was prepped and draped in the usual sterile fashion. Ultrasound survey of the left  proximal thigh was performed with images stored and sent to PACs. A micropuncture needle was used access the left superficial femoral artery under ultrasound in antegrade fashion. With excellent arterial blood flow returned, and an .018 micro wire was passed through the needle, observed to enter the SFA under fluoroscopy. The needle was removed, and a micropuncture sheath was placed over the wire. The inner dilator and wire were removed, and an 035 Bentson wire was advanced under fluoroscopy. The sheath was removed and a standard 5 [place] vascular sheath was placed. The dilator was removed and the sheath was flushed. Angiogram was performed of the left lower extremity. Ultrasound survey of the left dorsalis pedis was performed with images stored and sent to PACs. A micropuncture needle was used to access the left dorsalis pedis artery under ultrasound. With arterial blood flow returned, and an .018 micro wire was passed through the needle, observed to enter the distal anterior tibial artery under fluoroscopy. The needle was removed, and a micropuncture sheath was placed over the wire. The inner dilator and wire were removed. A pharmacologic cocktail was infused. Stiff Glidewire was then passed through the sheath in a retrograde fashion to the anterior tibial artery, traversing the occlusion at the proximal  anterior tibial artery and at the popliteal artery. The wire stayed luminal in the superficial femoral artery. The SFA sheath was then upsized to 6 [place]. A snare was used to snare the Glidewire from above. 035 quick cross catheter was then passed over the Glidewire to the distal anterior tibial artery. Down size to 018 system. Balloon angioplasty was then performed along the length of the anterior tibial artery with 2.0 mm diameter balloon, 2.5 mm diameter balloon, 3.0 mm diameter balloon. 2 mm balloon was used for hemostasis at the puncture site. Antegrade approach to the stump of the tibioperoneal trunk was used with a combination of Glidewire and quick cross catheter. Balloon angioplasty was performed with 2.5 mm diameter balloon and 3.0 mm diameter balloon at the origin, with restoration of flow. Spasm was induced in the mid and distal peroneal artery. Pedal pulse was confirmed at the conclusion of the case, which was not present at the initiation. All catheters and wires were removed. Sheath was removed from the superficial femoral artery, with manual pressure used for hemostasis. Patient tolerated the procedure well and remained hemodynamically stable throughout. No complications were encountered and no significant blood loss was encountered. FINDINGS: No palpable pulse at the initiation of the case. Doppler signals were present. Initial angiogram demonstrates no significant disease of the superficial femoral artery. Occlusion of the popliteal artery at the joint space of the knee. Occlusion extends through the popliteal artery, tibioperoneal trunk, and proximal anterior tibial artery. Reconstitution of the distal anterior tibial artery and of the peroneal artery. No posterior tibial filling was present. After revascularization, there is in-line flow through the popliteal artery, anterior tibial artery, and peroneal artery to the ankle. Spasm induced in the mid and distal peroneal artery, with palpable pulse  at the completion of the angiogram/treatment. IMPRESSION: Status post left lower extremity angiogram with antegrade and retrograde approach to popliteal occlusion and revascularization with plain balloon angioplasty restoring in-line flow through the popliteal artery, peroneal artery and anterior tibial artery to the ankle. Signed, Yvone Neu. Loreta Ave DO [facility] Radiology PLAN: p.o. Plavix 300 mg now. Left leg straight for 5 hours. Initiate 75 mg p.o. Plavix daily. Electronically Signed   By: Gilmer Mor D.O.  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 She has a vague history of what sounds like embolism to the popliteal artery and surgical thrombectomy in [place] many years prior. Prior inpatient noninvasive study demonstrates abnormal waveforms distally, with nonpalpable pulses of the foot. CT angiogram and runoff confirms a popliteal occlusion as well as tibial occlusions. She presents today for angiogram and attempt at revascularization for wound healing capability. EXAM: ULTRASOUND GUIDED ACCESS LEFT SUPERFICIAL FEMORAL ARTERY ULTRASOUND GUIDED ACCESS LEFT DORSALIS PEDIS REVASCULARIZATION OF POPLITEAL OCCLUSION WITH BALLOON ANGIOPLASTY OF POPLITEAL ARTERY REVASCULARIZATION OF LEFT ANTERIOR TIBIAL ARTERY WITH BALLOON ANGIOPLASTY REVASCULARIZATION OF LEFT TIBIOPERONEAL TRUNK AND PERONEAL ARTERY WITH BALLOON ANGIOPLASTY FLUOROSCOPY TIME:  25 minutes 54 seconds MEDICATIONS AND MEDICAL HISTORY: 2 mg intra-arterial tPA, 800 mcg nitroglycerin, 2.5 mg verapamil intra arterial ANESTHESIA/SEDATION: 2.5 mg Versed, 125 mcg fentanyl 180 minutes sedation time CONTRAST:  VISIPAQUE IODIXANOL 320 MG/ML IV SOLN COMPLICATIONS: Small puncture site hematoma at the left thigh.  SIR category 1. PROCEDURE: The procedure, risks, benefits, and  alternatives were explained to the patient and the patient's family. Specific risks that were addressed included bleeding, infection, contrast reaction, kidney injury, need for further procedure, limb loss, cardiopulmonary collapse, death. Questions regarding the procedure were encouraged and answered. The patient understands and consents to the procedure. Proximal left thigh was prepped and draped in the usual sterile fashion. The left ankle was prepped and draped in the usual sterile fashion. Ultrasound survey of the left proximal thigh was performed with images stored and sent to PACs. A micropuncture needle was used access the left superficial femoral artery under ultrasound in antegrade fashion. With excellent arterial blood flow returned, and an .018 micro wire was passed through the needle, observed to enter the SFA under fluoroscopy. The needle was removed, and a micropuncture sheath was placed over the wire. The inner dilator and wire were removed, and an 035 Bentson wire was advanced under fluoroscopy. The sheath was removed and a standard 5 [place] vascular sheath was placed. The dilator was removed and the sheath was flushed. Angiogram was performed of the left lower extremity. Ultrasound survey of the left dorsalis pedis was performed with images stored and sent to PACs. A micropuncture needle was used to access the left dorsalis pedis artery under ultrasound. With arterial blood flow returned, and an .018 micro wire was passed through the needle, observed to enter the distal anterior tibial artery under fluoroscopy. The needle was removed, and a micropuncture sheath was placed over the wire. The inner dilator and wire were removed. A pharmacologic cocktail was infused. Stiff Glidewire was then passed through the sheath in a retrograde fashion to the anterior tibial artery, traversing the occlusion at the proximal anterior tibial artery and at the popliteal artery. The wire stayed luminal in the  superficial femoral artery. The SFA sheath was then upsized to 6 [place]. A snare was used to snare the Glidewire from above. 035 quick cross catheter was then passed over the Glidewire to the distal anterior tibial artery. Down size to 018 system. Balloon angioplasty was then performed along the length of the anterior tibial artery with 2.0 mm diameter balloon, 2.5 mm diameter balloon, 3.0 mm diameter balloon. 2 mm balloon was used for hemostasis at the puncture site. Antegrade approach to the stump of the tibioperoneal trunk was used with a combination of Glidewire and quick  cross catheter. Balloon angioplasty was performed with 2.5 mm diameter balloon and 3.0 mm diameter balloon at the origin, with restoration of flow. Spasm was induced in the mid and distal peroneal artery. Pedal pulse was confirmed at the conclusion of the case, which was not present at the initiation. All catheters and wires were removed. Sheath was removed from the superficial femoral artery, with manual pressure used for hemostasis. Patient tolerated the procedure well and remained hemodynamically stable throughout. No complications were encountered and no significant blood loss was encountered. FINDINGS: No palpable pulse at the initiation of the case. Doppler signals were present. Initial angiogram demonstrates no significant disease of the superficial femoral artery. Occlusion of the popliteal artery at the joint space of the knee. Occlusion extends through the popliteal artery, tibioperoneal trunk, and proximal anterior tibial artery. Reconstitution of the distal anterior tibial artery and of the peroneal artery. No posterior tibial filling was present. After revascularization, there is in-line flow through the popliteal artery, anterior tibial artery, and peroneal artery to the ankle. Spasm induced in the mid and distal peroneal artery, with palpable pulse at the completion of the angiogram/treatment. IMPRESSION: Status post left lower  extremity angiogram with antegrade and retrograde approach to popliteal occlusion and revascularization with plain balloon angioplasty restoring in-line flow through the popliteal artery, peroneal artery and anterior tibial artery to the ankle. Signed, Yvone Neu. Loreta Ave DO [facility] Radiology PLAN: p.o. Plavix 300 mg now. Left leg straight for 5 hours. Initiate 75 mg p.o. Plavix daily. Electronically Signed   By: Gilmer Mor D.O.   On: 05/09/2015 16:00   Ct Angio Ao+bifem W/cm &/or Wo/cm  04/25/2015  CLINICAL DATA:  82 year old female with a history of a chronic wound of the left lower extremity. Previous noninvasive arterial examination demonstrates arterial occlusive disease. EXAM: CT ANGIOGRAPHY OF ABDOMINAL AORTA WITH ILIOFEMORAL RUNOFF TECHNIQUE: Multidetector CT imaging of the abdomen, pelvis and lower extremities was performed using the standard protocol during bolus administration of intravenous contrast. Multiplanar CT image reconstructions and MIPs were obtained to evaluate the vascular anatomy. CONTRAST:  OMNIPAQUE IOHEXOL 350 MG/ML SOLN COMPARISON:  MRI 01/23/2015 FINDINGS: Vascular: Aorta: Scattered calcified and soft plaque throughout the visualized lower thoracic and abdominal aorta. No aneurysm. There is a linear hypodensity associated with the right aspect of the aorta at the L3 level along the right aspect of the intima, potentially a chronic dissection or chronic fold given the patient's position. No periaortic fluid or inflammatory changes. Mesenteric vasculature: Calcifications at the origin of the celiac artery and superior mesenteric artery. There is likely at least 50% narrowing of the celiac artery origin though the branch vessels are patent. Single left renal artery origin is patent. Single right renal artery origin is patent, with calcifications at the origin with likely 50% stenosis. Inferior mesenteric artery origin is patent.  Vein vasculature: Uncertain patency of the IVC and iliac veins, as the timing of the contrast bolus does not opacified the venous system. Right Lower Extremity: Scattered calcifications of the right iliac system. Normal course caliber and contour of the right iliac system with no aneurysm or dissection flap. Right hypogastric artery is patent including the anterior and posterior divisions. Right common femoral artery patent. Right profunda femoris is patent. Length of the superficial femoral artery is patent with no significant atherosclerotic changes or stenosis. Popliteal artery patent. Anterior tibial artery patent from the origin to the ankle. Posterior tibial artery and peroneal artery appear patent from the  origin to the ankle. Left Lower Extremity: Minimal atherosclerotic changes of the left iliac system. Normal course caliber and contour with no dissection or aneurysm. Hypogastric arteries patent including anterior posterior divisions. External iliac artery patent. Common femoral artery patent. Profunda femoris patent. Length of the superficial femoral artery is patent through the adductor canal. No significant atherosclerotic changes. The popliteal artery occludes approximately at the knee joint. There are surgical clips and surgical changes within the soft tissues at the site of occlusion of the popliteal artery. Geniculate arteries/collaterals contribute to reconstitution of the tibial artery in the proximal third and the peroneal artery in the proximal third. No significant filling of the posterior tibial artery. Additional: Multiple tortuous venous structures in the superficial tissues of the left lower extremity. Nonvascular: Lower chest: Collateral venous drainage of the right greater than left chest wall and abdominal wall. No axillary adenopathy. Respiratory motion somewhat limits evaluation of the lung bases. No confluent airspace disease or pleural effusion. Minimal atelectasis. Heart is enlarged,  with pronounced enlargement of the left atrium and right atrium. Trace calcifications of the aortic valve. Small hiatal hernia. Abdomen/pelvis: Unremarkable appearance of liver, spleen, and adrenal glands. No abnormally distended small bowel or colon. Appendix is not visualized, however, no inflammatory changes are present adjacent to the cecum to indicate an appendicitis. Low-density/cystic structure in the head of the pancreas associated with the bile ducts with greatest diameter of 17 mm. Questionable ductal dilatation of the pancreatic duct at the pancreatic body and pancreatic tail. No free fluid.  No free intraperitoneal air. Unremarkable appearance of the urinary bladder. Unremarkable appearance of the uterus and adnexa. Symmetric enhancement of the bilateral kidneys. No nephrolithiasis. No hydronephrosis. 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 Nonspecific pelvic floor laxity. Musculoskeletal: Borderline enlarged lymph nodes of the bilateral inguinal region and of the left popliteal region, presumably reactive given the patient's known chronic wound of the left lower extremity. Diffuse osteopenia. Accentuated kyphotic curvature of the thoracic spine is evident given the positioning. Compression fracture of L2 is essentially unchanged from the plain film dated 12/15/2012. No significant bony canal narrowing. Multilevel degenerative disc disease throughout the visualized spine. Advanced degenerative changes of the L5-S1 level with associated facet disease. No acute displaced fracture. 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 Soft tissue changes of the left calf anterior and posterior, compatible with the patient's known inflammation/ wound. Surgical changes of the posterior left distal thigh and calf. Review of the MIP images confirms the above findings. IMPRESSION: Vascular: Minimal atherosclerotic changes of the visualized aorta, iliac system, and bilateral femoral popliteal system. Major  finding of the left lower extremity is a popliteal artery occlusion at the level of the knee joint in the region of surgical changes, with reconstitution of the tibial artery and peroneal artery via collateral flow. No significant appreciable flow within the posterior tibial artery. Extensive venous collaterals of the abdominal and chest wall, which are more pronounced on the right side. The iliac veins and IVC are not well evaluated on the current study given the timing of the contrast bolus. If there were concern for ilio-caval occlusion, correlation with duplex study may be useful, or alternatively cross-sectional imaging with the venous phase. Reactive lymph nodes of the left greater than right inguinal region and left popliteal region. Associated CT imaging findings of the patient's known left lower extremity chronic soft tissue wound. Cardiomegaly. Nonvascular: Cystic lesion measuring 17 mm in the head of the pancreas associated with the extrahepatic biliary ducts. Most  likely cause would be a ductal dilatation in a patient of this age or a pseudocyst. Alternatively, cystic neoplasm could have this appearance, and if there were clinical concern for further evaluation, MRI could be considered as a more specific/ sensitive test of choice. Signed, Yvone Neu. Loreta Ave, DO [facility] Radiology Electronically Signed   By: Gilmer Mor D.O.   On: 04/25/2015 17:40   Ct Hip Left Wo Contrast  05/10/2015  CLINICAL DATA:  Lost her balance and fell this evening. EXAM: CT OF THE LEFT HIP WITHOUT CONTRAST TECHNIQUE: Multidetector CT imaging of the left hip was performed according to the standard protocol. Multiplanar CT image reconstructions were also generated. COMPARISON:  None. FINDINGS: There is generalized osteopenia. There is no acute fracture or dislocation. There is mild osteoarthritis of the left hip. There is no aggressive lytic or sclerotic osseous lesion. There is  enthesopathic changes at the iliopsoas insertion. Grade 1 anterolisthesis of L5 on S1. Bilateral severe facet arthropathy L5-S1. Left L5 pars defect. There is mild left inguinal lymphadenopathy. There is generalized fat stranding involving the left hip and upper thigh. There is skin thickening along the medial aspect of the upper thigh. There is no soft tissue emphysema. Mild fat stranding in the left inguinal region which may be secondary to recent catheterization. IMPRESSION: 1. No acute osseous injury of the left hip. 2. Generalized soft tissue edema in the subcutaneous fat around the pelvis and upper thigh. 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 She has a vague history of what sounds like embolism to the popliteal artery and surgical thrombectomy in [place] many years prior. Prior inpatient noninvasive study demonstrates abnormal waveforms distally, with nonpalpable pulses of the foot. CT angiogram and runoff confirms a popliteal occlusion as well as tibial occlusions. She presents today for angiogram and attempt at revascularization for wound healing capability. EXAM: ULTRASOUND GUIDED ACCESS LEFT SUPERFICIAL FEMORAL ARTERY ULTRASOUND GUIDED ACCESS LEFT DORSALIS PEDIS REVASCULARIZATION OF POPLITEAL OCCLUSION WITH BALLOON ANGIOPLASTY OF POPLITEAL ARTERY REVASCULARIZATION OF LEFT ANTERIOR TIBIAL ARTERY WITH BALLOON ANGIOPLASTY REVASCULARIZATION OF LEFT TIBIOPERONEAL TRUNK AND PERONEAL ARTERY WITH BALLOON ANGIOPLASTY FLUOROSCOPY TIME:  25 minutes 54 seconds MEDICATIONS AND MEDICAL HISTORY: 2 mg intra-arterial tPA, 800 mcg nitroglycerin, 2.5 mg verapamil intra arterial ANESTHESIA/SEDATION: 2.5 mg Versed, 125 mcg fentanyl 180 minutes sedation time CONTRAST:  VISIPAQUE IODIXANOL 320 MG/ML IV SOLN COMPLICATIONS: Small puncture  site hematoma at the left thigh.  SIR category 1. PROCEDURE: The procedure, risks, benefits, and alternatives were explained to the patient and the patient's family. Specific risks that were addressed included bleeding, infection, contrast reaction, kidney injury, need for further procedure, limb loss, cardiopulmonary collapse, death. Questions regarding the procedure were encouraged and answered. The patient understands and consents to the procedure. Proximal left thigh was prepped and draped in the usual sterile fashion. The left ankle was prepped and draped in the usual sterile fashion. Ultrasound survey of the left proximal thigh was performed with images stored and sent to PACs. A micropuncture needle was used access the left superficial femoral artery under ultrasound in antegrade fashion. With excellent arterial blood flow returned, and an .018 micro wire was passed through the needle, observed to enter the SFA under fluoroscopy. The needle was removed, and a micropuncture sheath  was placed over the wire. The inner dilator and wire were removed, and an 035 Bentson wire was advanced under fluoroscopy. The sheath was removed and a standard 5 [place] vascular sheath was placed. The dilator was removed and the sheath was flushed. Angiogram was performed of the left lower extremity. Ultrasound survey of the left dorsalis pedis was performed with images stored and sent to PACs. A micropuncture needle was used to access the left dorsalis pedis artery under ultrasound. With arterial blood flow returned, and an .018 micro wire was passed through the needle, observed to enter the distal anterior tibial artery under fluoroscopy. The needle was removed, and a micropuncture sheath was placed over the wire. The inner dilator and wire were removed. A pharmacologic cocktail was infused. Stiff Glidewire was then passed through the sheath in a retrograde fashion to the anterior tibial artery, traversing the occlusion at the  proximal anterior tibial artery and at the popliteal artery. The wire stayed luminal in the superficial femoral artery. The SFA sheath was then upsized to 6 [place]. A snare was used to snare the Glidewire from above. 035 quick cross catheter was then passed over the Glidewire to the distal anterior tibial artery. Down size to 018 system. Balloon angioplasty was then performed along the length of the anterior tibial artery with 2.0 mm diameter balloon, 2.5 mm diameter balloon, 3.0 mm diameter balloon. 2 mm balloon was used for hemostasis at the puncture site. Antegrade approach to the stump of the tibioperoneal trunk was used with a combination of Glidewire and quick cross catheter. Balloon angioplasty was performed with 2.5 mm diameter balloon and 3.0 mm diameter balloon at the origin, with restoration of flow. Spasm was induced in the mid and distal peroneal artery. Pedal pulse was confirmed at the conclusion of the case, which was not present at the initiation. All catheters and wires were removed. Sheath was removed from the superficial femoral artery, with manual pressure used for hemostasis. Patient tolerated the procedure well and remained hemodynamically stable throughout. No complications were encountered and no significant blood loss was encountered. FINDINGS: No palpable pulse at the initiation of the case. Doppler signals were present. Initial angiogram demonstrates no significant disease of the superficial femoral artery. Occlusion of the popliteal artery at the joint space of the knee. Occlusion extends through the popliteal artery, tibioperoneal trunk, and proximal anterior tibial artery. Reconstitution of the distal anterior tibial artery and of the peroneal artery. No posterior tibial filling was present. After revascularization, there is in-line flow through the popliteal artery, anterior tibial artery, and peroneal artery to the ankle. Spasm induced in the mid and distal peroneal artery, with  palpable pulse at the completion of the angiogram/treatment. IMPRESSION: Status post left lower extremity angiogram with antegrade and retrograde approach to popliteal occlusion and revascularization with plain balloon angioplasty restoring in-line flow through the popliteal artery, peroneal artery and anterior tibial artery to the ankle. Signed, Yvone Neu. Loreta Ave DO [facility] Radiology PLAN: p.o. Plavix 300 mg now. Left leg straight for 5 hours. Initiate 75 mg p.o. Plavix daily. Electronically Signed   By: Gilmer Mor D.O.   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 She has a vague history of what sounds like embolism to the popliteal artery and surgical thrombectomy in [place] many years prior. Prior inpatient noninvasive study demonstrates  abnormal waveforms distally, with nonpalpable pulses of the foot. CT angiogram and runoff confirms a popliteal occlusion as well as tibial occlusions. She presents today for angiogram and attempt at revascularization for wound healing capability. EXAM: ULTRASOUND GUIDED ACCESS LEFT SUPERFICIAL FEMORAL ARTERY ULTRASOUND GUIDED ACCESS LEFT DORSALIS PEDIS REVASCULARIZATION OF POPLITEAL OCCLUSION WITH BALLOON ANGIOPLASTY OF POPLITEAL ARTERY REVASCULARIZATION OF LEFT ANTERIOR TIBIAL ARTERY WITH BALLOON ANGIOPLASTY REVASCULARIZATION OF LEFT TIBIOPERONEAL TRUNK AND PERONEAL ARTERY WITH BALLOON ANGIOPLASTY FLUOROSCOPY TIME:  25 minutes 54 seconds MEDICATIONS AND MEDICAL HISTORY: 2 mg intra-arterial tPA, 800 mcg nitroglycerin, 2.5 mg verapamil intra arterial ANESTHESIA/SEDATION: 2.5 mg Versed, 125 mcg fentanyl 180 minutes sedation time CONTRAST:  VISIPAQUE IODIXANOL 320 MG/ML IV SOLN COMPLICATIONS: Small puncture site hematoma at the left thigh.  SIR category 1. PROCEDURE: The procedure, risks,  benefits, and alternatives were explained to the patient and the patient's family. Specific risks that were addressed included bleeding, infection, contrast reaction, kidney injury, need for further procedure, limb loss, cardiopulmonary collapse, death. Questions regarding the procedure were encouraged and answered. The patient understands and consents to the procedure. Proximal left thigh was prepped and draped in the usual sterile fashion. The left ankle was prepped and draped in the usual sterile fashion. Ultrasound survey of the left proximal thigh was performed with images stored and sent to PACs. A micropuncture needle was used access the left superficial femoral artery under ultrasound in antegrade fashion. With excellent arterial blood flow returned, and an .018 micro wire was passed through the needle, observed to enter the SFA under fluoroscopy. The needle was removed, and a micropuncture sheath was placed over the wire. The inner dilator and wire were removed, and an 035 Bentson wire was advanced under fluoroscopy. The sheath was removed and a standard 5 [place] vascular sheath was placed. The dilator was removed and the sheath was flushed. Angiogram was performed of the left lower extremity. Ultrasound survey of the left dorsalis pedis was performed with images stored and sent to PACs. A micropuncture needle was used to access the left dorsalis pedis artery under ultrasound. With arterial blood flow returned, and an .018 micro wire was passed through the needle, observed to enter the distal anterior tibial artery under fluoroscopy. The needle was removed, and a micropuncture sheath was placed over the wire. The inner dilator and wire were removed. A pharmacologic cocktail was infused. Stiff Glidewire was then passed through the sheath in a retrograde fashion to the anterior tibial artery, traversing the occlusion at the proximal anterior tibial artery and at the popliteal artery. The wire stayed luminal  in the superficial femoral artery. The SFA sheath was then upsized to 6 [place]. A snare was used to snare the Glidewire from above. 035 quick cross catheter was then passed over the Glidewire to the distal anterior tibial artery. Down size to 018 system. Balloon angioplasty was then performed along the length of the anterior tibial artery with 2.0 mm diameter balloon, 2.5 mm diameter balloon, 3.0 mm diameter balloon. 2 mm balloon was used for hemostasis at the puncture site. Antegrade approach to the stump of the tibioperoneal trunk was used with a combination of Glidewire and quick cross catheter. Balloon angioplasty was performed with 2.5 mm diameter balloon and 3.0 mm diameter balloon at the origin, with restoration of flow. Spasm was induced in the mid and distal peroneal artery. Pedal pulse was confirmed at the conclusion of the case, which was not present at the initiation. All catheters and wires were removed. Sheath  was removed from the superficial femoral artery, with manual pressure used for hemostasis. Patient tolerated the procedure well and remained hemodynamically stable throughout. No complications were encountered and no significant blood loss was encountered. FINDINGS: No palpable pulse at the initiation of the case. Doppler signals were present. Initial angiogram demonstrates no significant disease of the superficial femoral artery. Occlusion of the popliteal artery at the joint space of the knee. Occlusion extends through the popliteal artery, tibioperoneal trunk, and proximal anterior tibial artery. Reconstitution of the distal anterior tibial artery and of the peroneal artery. No posterior tibial filling was present. After revascularization, there is in-line flow through the popliteal artery, anterior tibial artery, and peroneal artery to the ankle. Spasm induced in the mid and distal peroneal artery, with palpable pulse at the completion of the angiogram/treatment. IMPRESSION: Status post left  lower extremity angiogram with antegrade and retrograde approach to popliteal occlusion and revascularization with plain balloon angioplasty restoring in-line flow through the popliteal artery, peroneal artery and anterior tibial artery to the ankle. Signed, Yvone Neu. Loreta Ave DO [facility] PLAN: p.o. Plavix 300 mg now. Left leg straight for 5 hours. Initiate 75 mg p.o. Plavix daily. Electronically Signed   By: Gilmer Mor D.O.   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 She has a vague history of what sounds like embolism to the popliteal artery and surgical thrombectomy in [place] many years prior. Prior inpatient noninvasive study demonstrates abnormal waveforms distally, with nonpalpable pulses of the foot. CT angiogram and runoff confirms a popliteal occlusion as well as tibial occlusions. She presents today for angiogram and attempt at revascularization for wound healing capability. EXAM: ULTRASOUND GUIDED ACCESS LEFT SUPERFICIAL FEMORAL ARTERY ULTRASOUND GUIDED ACCESS LEFT DORSALIS PEDIS REVASCULARIZATION OF POPLITEAL OCCLUSION WITH BALLOON ANGIOPLASTY OF POPLITEAL ARTERY REVASCULARIZATION OF LEFT ANTERIOR TIBIAL ARTERY WITH BALLOON ANGIOPLASTY REVASCULARIZATION OF LEFT TIBIOPERONEAL TRUNK AND PERONEAL ARTERY WITH BALLOON ANGIOPLASTY FLUOROSCOPY TIME:  25 minutes 54 seconds MEDICATIONS AND MEDICAL HISTORY: 2 mg intra-arterial tPA, 800 mcg nitroglycerin, 2.5 mg verapamil intra arterial ANESTHESIA/SEDATION: 2.5 mg Versed, 125 mcg fentanyl 180 minutes sedation time CONTRAST:  VISIPAQUE IODIXANOL 320 MG/ML IV SOLN COMPLICATIONS: Small puncture site hematoma at the left thigh.  SIR category 1. PROCEDURE: The procedure, risks, benefits, and alternatives were explained to the patient and the patient's  family. Specific risks that were addressed included bleeding, infection, contrast reaction, kidney injury, need for further procedure, limb loss, cardiopulmonary collapse, death. Questions regarding the procedure were encouraged and answered. The patient understands and consents to the procedure. Proximal left thigh was prepped and draped in the usual sterile fashion. The left ankle was prepped and draped in the usual sterile fashion. Ultrasound survey of the left proximal thigh was performed with images stored and sent to PACs. A micropuncture needle was used access the left superficial femoral artery under ultrasound in antegrade fashion. With excellent arterial blood flow returned, and an .018 micro wire was passed through the needle, observed to enter the SFA under fluoroscopy. The needle was removed, and a micropuncture sheath was placed over the wire. The inner dilator and wire were removed, and an 035 Bentson wire was advanced under fluoroscopy. The sheath was removed and a standard 5 [place] vascular sheath was placed. The dilator was removed and the sheath was flushed. Angiogram  was performed of the left lower extremity. Ultrasound survey of the left dorsalis pedis was performed with images stored and sent to PACs. A micropuncture needle was used to access the left dorsalis pedis artery under ultrasound. With arterial blood flow returned, and an .018 micro wire was passed through the needle, observed to enter the distal anterior tibial artery under fluoroscopy. The needle was removed, and a micropuncture sheath was placed over the wire. The inner dilator and wire were removed. A pharmacologic cocktail was infused. Stiff Glidewire was then passed through the sheath in a retrograde fashion to the anterior tibial artery, traversing the occlusion at the proximal anterior tibial artery and at the popliteal artery. The wire stayed luminal in the superficial femoral artery. The SFA sheath was then upsized to 6  [place]. A snare was used to snare the Glidewire from above. 035 quick cross catheter was then passed over the Glidewire to the distal anterior tibial artery. Down size to 018 system. Balloon angioplasty was then performed along the length of the anterior tibial artery with 2.0 mm diameter balloon, 2.5 mm diameter balloon, 3.0 mm diameter balloon. 2 mm balloon was used for hemostasis at the puncture site. Antegrade approach to the stump of the tibioperoneal trunk was used with a combination of Glidewire and quick cross catheter. Balloon angioplasty was performed with 2.5 mm diameter balloon and 3.0 mm diameter balloon at the origin, with restoration of flow. Spasm was induced in the mid and distal peroneal artery. Pedal pulse was confirmed at the conclusion of the case, which was not present at the initiation. All catheters and wires were removed. Sheath was removed from the superficial femoral artery, with manual pressure used for hemostasis. Patient tolerated the procedure well and remained hemodynamically stable throughout. No complications were encountered and no significant blood loss was encountered. FINDINGS: No palpable pulse at the initiation of the case. Doppler signals were present. Initial angiogram demonstrates no significant disease of the superficial femoral artery. Occlusion of the popliteal artery at the joint space of the knee. Occlusion extends through the popliteal artery, tibioperoneal trunk, and proximal anterior tibial artery. Reconstitution of the distal anterior tibial artery and of the peroneal artery. No posterior tibial filling was present. After revascularization, there is in-line flow through the popliteal artery, anterior tibial artery, and peroneal artery to the ankle. Spasm induced in the mid and distal peroneal artery, with palpable pulse at the completion of the angiogram/treatment. IMPRESSION: Status post left lower extremity angiogram with antegrade and retrograde approach to  popliteal occlusion and revascularization with plain balloon angioplasty restoring in-line flow through the popliteal artery, peroneal artery and anterior tibial artery to the ankle. Signed, Yvone Neu. Loreta Ave DO [facility] Radiology PLAN: p.o. Plavix 300 mg now. Left leg straight for 5 hours. Initiate 75 mg p.o. Plavix daily. Electronically Signed   By: Gilmer Mor D.O.   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 She has a vague history of what sounds like embolism to the popliteal artery and surgical thrombectomy in [place] many years prior. Prior inpatient noninvasive study demonstrates abnormal waveforms distally, with nonpalpable pulses of the foot. CT angiogram and runoff confirms a popliteal occlusion as well as tibial occlusions. She presents today for angiogram and attempt at revascularization for wound healing capability. EXAM: ULTRASOUND GUIDED ACCESS LEFT SUPERFICIAL FEMORAL ARTERY ULTRASOUND  GUIDED ACCESS LEFT DORSALIS PEDIS REVASCULARIZATION OF POPLITEAL OCCLUSION WITH BALLOON ANGIOPLASTY OF POPLITEAL ARTERY REVASCULARIZATION OF LEFT ANTERIOR TIBIAL ARTERY WITH BALLOON ANGIOPLASTY REVASCULARIZATION OF LEFT TIBIOPERONEAL TRUNK AND PERONEAL ARTERY WITH BALLOON ANGIOPLASTY FLUOROSCOPY TIME:  25 minutes 54 seconds MEDICATIONS AND MEDICAL HISTORY: 2 mg intra-arterial tPA, 800 mcg nitroglycerin, 2.5 mg verapamil intra arterial ANESTHESIA/SEDATION: 2.5 mg Versed, 125 mcg fentanyl 180 minutes sedation time CONTRAST:  VISIPAQUE IODIXANOL 320 MG/ML IV SOLN COMPLICATIONS: Small puncture site hematoma at the left thigh.  SIR category 1. PROCEDURE: The procedure, risks, benefits, and alternatives were explained to the patient and the patient's family. Specific risks that were addressed included bleeding, infection, contrast  reaction, kidney injury, need for further procedure, limb loss, cardiopulmonary collapse, death. Questions regarding the procedure were encouraged and answered. The patient understands and consents to the procedure. Proximal left thigh was prepped and draped in the usual sterile fashion. The left ankle was prepped and draped in the usual sterile fashion. Ultrasound survey of the left proximal thigh was performed with images stored and sent to PACs. A micropuncture needle was used access the left superficial femoral artery under ultrasound in antegrade fashion. With excellent arterial blood flow returned, and an .018 micro wire was passed through the needle, observed to enter the SFA under fluoroscopy. The needle was removed, and a micropuncture sheath was placed over the wire. The inner dilator and wire were removed, and an 035 Bentson wire was advanced under fluoroscopy. The sheath was removed and a standard 5 [place] vascular sheath was placed. The dilator was removed and the sheath was flushed. Angiogram was performed of the left lower extremity. Ultrasound survey of the left dorsalis pedis was performed with images stored and sent to PACs. A micropuncture needle was used to access the left dorsalis pedis artery under ultrasound. With arterial blood flow returned, and an .018 micro wire was passed through the needle, observed to enter the distal anterior tibial artery under fluoroscopy. The needle was removed, and a micropuncture sheath was placed over the wire. The inner dilator and wire were removed. A pharmacologic cocktail was infused. Stiff Glidewire was then passed through the sheath in a retrograde fashion to the anterior tibial artery, traversing the occlusion at the proximal anterior tibial artery and at the popliteal artery. The wire stayed luminal in the superficial femoral artery. The SFA sheath was then upsized to 6 [place]. A snare was used to snare the Glidewire from above. 035 quick cross catheter  was then passed over the Glidewire to the distal anterior tibial artery. Down size to 018 system. Balloon angioplasty was then performed along the length of the anterior tibial artery with 2.0 mm diameter balloon, 2.5 mm diameter balloon, 3.0 mm diameter balloon. 2 mm balloon was used for hemostasis at the puncture site. Antegrade approach to the stump of the tibioperoneal trunk was used with a combination of Glidewire and quick cross catheter. Balloon angioplasty was performed with 2.5 mm diameter balloon and 3.0 mm diameter balloon at the origin, with restoration of flow. Spasm was induced in the mid and distal peroneal artery. Pedal pulse was confirmed at the conclusion of the case, which was not present at the initiation. All catheters and wires were removed. Sheath was removed from the superficial femoral artery, with manual pressure used for hemostasis. Patient tolerated the procedure well and remained hemodynamically stable throughout. No complications were encountered and no significant blood loss was encountered. FINDINGS: No palpable pulse at the initiation of the case.  Doppler signals were present. Initial angiogram demonstrates no significant disease of the superficial femoral artery. Occlusion of the popliteal artery at the joint space of the knee. Occlusion extends through the popliteal artery, tibioperoneal trunk, and proximal anterior tibial artery. Reconstitution of the distal anterior tibial artery and of the peroneal artery. No posterior tibial filling was present. After revascularization, there is in-line flow through the popliteal artery, anterior tibial artery, and peroneal artery to the ankle. Spasm induced in the mid and distal peroneal artery, with palpable pulse at the completion of the angiogram/treatment. IMPRESSION: Status post left lower extremity angiogram with antegrade and retrograde approach to popliteal occlusion and revascularization with plain balloon angioplasty restoring  in-line flow through the popliteal artery, peroneal artery and anterior tibial artery to the ankle. Signed, Yvone Neu. Loreta Ave DO [facility] Radiology PLAN: p.o. Plavix 300 mg now. Left leg straight for 5 hours. Initiate 75 mg p.o. Plavix daily. Electronically Signed   By: Gilmer Mor D.O.   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 She has a vague history of what sounds like embolism to the popliteal artery and surgical thrombectomy in [place] many years prior. Prior inpatient noninvasive study demonstrates abnormal waveforms distally, with nonpalpable pulses of the foot. CT angiogram and runoff confirms a popliteal occlusion as well as tibial occlusions. She presents today for angiogram and attempt at revascularization for wound healing capability. EXAM: ULTRASOUND GUIDED ACCESS LEFT SUPERFICIAL FEMORAL ARTERY ULTRASOUND GUIDED ACCESS LEFT DORSALIS PEDIS REVASCULARIZATION OF POPLITEAL OCCLUSION WITH BALLOON ANGIOPLASTY OF POPLITEAL ARTERY REVASCULARIZATION OF LEFT ANTERIOR TIBIAL ARTERY WITH BALLOON ANGIOPLASTY REVASCULARIZATION OF LEFT TIBIOPERONEAL TRUNK AND PERONEAL ARTERY WITH BALLOON ANGIOPLASTY FLUOROSCOPY TIME:  25 minutes 54 seconds MEDICATIONS AND MEDICAL HISTORY: 2 mg intra-arterial tPA, 800 mcg nitroglycerin, 2.5 mg verapamil intra arterial ANESTHESIA/SEDATION: 2.5 mg Versed, 125 mcg fentanyl 180 minutes sedation time CONTRAST:  VISIPAQUE IODIXANOL 320 MG/ML IV SOLN COMPLICATIONS: Small puncture site hematoma at the left thigh.  SIR category 1. PROCEDURE: The procedure, risks, benefits, and alternatives were explained to the patient and the patient's family. Specific risks that were addressed included bleeding, infection, contrast reaction, kidney injury, need for further procedure, limb loss, cardiopulmonary  collapse, death. Questions regarding the procedure were encouraged and answered. The patient understands and consents to the procedure. Proximal left thigh was prepped and draped in the usual sterile fashion. The left ankle was prepped and draped in the usual sterile fashion. Ultrasound survey of the left proximal thigh was performed with images stored and sent to PACs. A micropuncture needle was used access the left superficial femoral artery under ultrasound in antegrade fashion. With excellent arterial blood flow returned, and an .018 micro wire was passed through the needle, observed to enter the SFA under fluoroscopy. The needle was removed, and a micropuncture sheath was placed over the wire. The inner dilator and wire were removed, and an 035 Bentson wire was advanced under fluoroscopy. The sheath was removed and a standard 5 [place] vascular sheath was placed. The dilator was removed and the sheath was flushed. Angiogram was performed of the left lower extremity. Ultrasound survey of the left dorsalis pedis was performed with images stored and sent to PACs. A micropuncture needle was used to access the left dorsalis pedis artery under ultrasound. With arterial blood flow returned, and an .018 micro wire was  passed through the needle, observed to enter the distal anterior tibial artery under fluoroscopy. The needle was removed, and a micropuncture sheath was placed over the wire. The inner dilator and wire were removed. A pharmacologic cocktail was infused. Stiff Glidewire was then passed through the sheath in a retrograde fashion to the anterior tibial artery, traversing the occlusion at the proximal anterior tibial artery and at the popliteal artery. The wire stayed luminal in the superficial femoral artery. The SFA sheath was then upsized to 6 [place]. A snare was used to snare the Glidewire from above. 035 quick cross catheter was then passed over the Glidewire to the distal anterior tibial artery. Down  size to 018 system. Balloon angioplasty was then performed along the length of the anterior tibial artery with 2.0 mm diameter balloon, 2.5 mm diameter balloon, 3.0 mm diameter balloon. 2 mm balloon was used for hemostasis at the puncture site. Antegrade approach to the stump of the tibioperoneal trunk was used with a combination of Glidewire and quick cross catheter. Balloon angioplasty was performed with 2.5 mm diameter balloon and 3.0 mm diameter balloon at the origin, with restoration of flow. Spasm was induced in the mid and distal peroneal artery. Pedal pulse was confirmed at the conclusion of the case, which was not present at the initiation. All catheters and wires were removed. Sheath was removed from the superficial femoral artery, with manual pressure used for hemostasis. Patient tolerated the procedure well and remained hemodynamically stable throughout. No complications were encountered and no significant blood loss was encountered. FINDINGS: No palpable pulse at the initiation of the case. Doppler signals were present. Initial angiogram demonstrates no significant disease of the superficial femoral artery. Occlusion of the popliteal artery at the joint space of the knee. Occlusion extends through the popliteal artery, tibioperoneal trunk, and proximal anterior tibial artery. Reconstitution of the distal anterior tibial artery and of the peroneal artery. No posterior tibial filling was present. After revascularization, there is in-line flow through the popliteal artery, anterior tibial artery, and peroneal artery to the ankle. Spasm induced in the mid and distal peroneal artery, with palpable pulse at the completion of the angiogram/treatment. IMPRESSION: Status post left lower extremity angiogram with antegrade and retrograde approach to popliteal occlusion and revascularization with plain balloon angioplasty restoring in-line flow through the popliteal artery, peroneal artery and anterior tibial  artery to the ankle. Signed, Yvone Neu. Loreta Ave DO [facility] Radiology PLAN: p.o. Plavix 300 mg now. Left leg straight for 5 hours. Initiate 75 mg p.o. Plavix daily. Electronically Signed   By: Gilmer Mor D.O.   On: 05/09/2015 16:00    CBC  Recent Labs Lab 05/09/15 0719 05/10/15 0143 05/10/15 2322 05/11/15 0330 05/12/15 0350  WBC 3.7* 17.8*  --  16.6* 11.9*  HGB 10.0* 7.9* 8.7* 8.7* 9.0*  HCT 31.5* 24.7* 27.0* 26.6* 27.8*  PLT 172 175  --  146* 138*  MCV 92.4 91.5  --  88.4 89.7  MCH 29.3 29.3  --  28.9 29.0  MCHC 31.7 32.0  --  32.7 32.4  RDW 15.7* 15.8*  --  17.2* 17.5*  LYMPHSABS  --  0.5*  --   --   --   MONOABS  --  0.2  --   --   --   EOSABS  --  0.0  --   --   --   BASOSABS  --  0.0  --   --   --  Chemistries   Recent Labs Lab 05/09/15 0719 05/10/15 0143 05/11/15 0330 05/12/15 0350  NA 140 140 141 144  K 4.2 3.8 3.6 3.8  CL 101 101 110 114*  CO2 30 23 25  20*  GLUCOSE 142* 98 89 87  BUN 19 28* 28* 18  CREATININE 0.98 1.83* 1.19* 0.95  CALCIUM 9.1 8.3* 7.9* 8.3*  AST  --  74* 112*  --   ALT  --  23 50  --   ALKPHOS  --  52 46  --   BILITOT  --  0.9 2.5*  --    ------------------------------------------------------------------------------------------------------------------ estimated creatinine clearance is 42.4 mL/min (by C-G formula based on Cr of 0.95). ------------------------------------------------------------------------------------------------------------------ No results for input(s): HGBA1C in the last 72 hours. ------------------------------------------------------------------------------------------------------------------ No results for input(s): CHOL, HDL, LDLCALC, TRIG, CHOLHDL, LDLDIRECT in the last 72 hours. ------------------------------------------------------------------------------------------------------------------ No results for input(s): TSH, T4TOTAL, T3FREE, THYROIDAB in the last  72 hours.  Invalid input(s): FREET3 ------------------------------------------------------------------------------------------------------------------ No results for input(s): VITAMINB12, FOLATE, FERRITIN, TIBC, IRON, RETICCTPCT in the last 72 hours.  Coagulation profile  Recent Labs Lab 05/09/15 0719 05/10/15 0143 05/11/15 1100 05/12/15 0350  INR 1.25 1.62* 1.68* 1.32    No results for input(s): DDIMER in the last 72 hours.  Cardiac Enzymes No results for input(s): CKMB, TROPONINI, MYOGLOBIN in the last 168 hours.  Invalid input(s): CK ------------------------------------------------------------------------------------------------------------------ Invalid input(s): POCBNP    Telecia Larocque D.O. on 05/12/2015 at 10:35 AM  Between 7am to 7pm - Pager - [phone]  After 7pm go to [email or web address] - password [email or web address]  And look for the night coverage person covering for me after hours  [facility] Office  [phone]

## 2015-05-12 NOTE — Progress Notes (Signed)
ANTICOAGULATION CONSULT NOTE -   Pharmacy Consult for coumadin Indication: atrial fibrillation & hx DVT  Allergies  Allergen Reactions  . Bactrim Ds [Sulfamethoxazole-Trimethoprim] Rash  . Contrast Media [Iodinated Diagnostic Agents] Hives    BLE erythema   . Ciprofloxacin Rash  . Tape Rash    Patient Measurements: Height: 5\' 4"  (162.6 cm) Weight: 149 lb 0.5 oz (67.6 kg) IBW/kg (Calculated) : 54.7   Vital Signs: Temp: 98 F (36.7 C) (01/15 1222) Temp Source: Oral (01/15 1222) BP: 116/75 mmHg (01/15 1248) Pulse Rate: 103 (01/15 1248)  Labs:  Recent Labs  05/10/15 0143 05/10/15 2322 05/11/15 0330 05/11/15 1100 05/12/15 0350  HGB 7.9* 8.7* 8.7*  --  9.0*  HCT 24.7* 27.0* 26.6*  --  27.8*  PLT 175  --  146*  --  138*  LABPROT 19.3*  --   --  19.8* 16.5*  INR 1.62*  --   --  1.68* 1.32  CREATININE 1.83*  --  1.19*  --  0.95    Estimated Creatinine Clearance: 42.4 mL/min (by C-G formula based on Cr of 0.95).   Assessment: 82 yo F admitted s/p fall at home.   Coum PTA for afib and hx DVT in 2014. INR today is subtherapeutic at 1.32. Hg 7.9>>9 s/ p 2 units PRBCs; plct 172>175>146>138.  No bleeding reported. Coum clinic note 12/27 INR 2.8, continue 4.5 dailyx 3 mg Tues/Friday, [facility] s/p revasc chronic L pop artery occlusion, small hematoma L SFA access site. US 1/12 neg pseudoaneurysm S/p fall 1/13 after discharged home from short stay [employer] warf/plavix doppler 1/13 neg for DVT She says she has not taken any coumadin since last Saturday 1/7.   Goal of Therapy:  INR 2-3   Plan:  Coumadin 5 mg po x 1 Daily INR  Herby AbrahamMichelle T. Kenyada Dosch, Pharm.D. [phone] 05/12/2015 2:17 PM

## 2015-05-13 DIAGNOSIS — E039 Hypothyroidism, unspecified: Secondary | ICD-10-CM

## 2015-05-13 DIAGNOSIS — D62 Acute posthemorrhagic anemia: Principal | ICD-10-CM

## 2015-05-13 LAB — GLUCOSE, CAPILLARY
GLUCOSE-CAPILLARY: 76 mg/dL (ref 65–99)
GLUCOSE-CAPILLARY: 82 mg/dL (ref 65–99)

## 2015-05-13 LAB — CBC
HCT: 28.5 % — ABNORMAL LOW (ref 36.0–46.0)
HEMOGLOBIN: 9 g/dL — AB (ref 12.0–15.0)
MCH: 28.4 pg (ref 26.0–34.0)
MCHC: 31.6 g/dL (ref 30.0–36.0)
MCV: 89.9 fL (ref 78.0–100.0)
Platelets: 131 10*3/uL — ABNORMAL LOW (ref 150–400)
RBC: 3.17 MIL/uL — ABNORMAL LOW (ref 3.87–5.11)
RDW: 17 % — ABNORMAL HIGH (ref 11.5–15.5)
WBC: 6.7 10*3/uL (ref 4.0–10.5)

## 2015-05-13 LAB — BASIC METABOLIC PANEL
Anion gap: 5 (ref 5–15)
BUN: 15 mg/dL (ref 6–20)
CALCIUM: 8 mg/dL — AB (ref 8.9–10.3)
CHLORIDE: 114 mmol/L — AB (ref 101–111)
CO2: 23 mmol/L (ref 22–32)
CREATININE: 1 mg/dL (ref 0.44–1.00)
GFR, EST AFRICAN AMERICAN: 59 mL/min — AB (ref >60)
GFR, EST NON AFRICAN AMERICAN: 51 mL/min — AB (ref >60)
Glucose, Bld: 88 mg/dL (ref 65–99)
Potassium: 3.8 mmol/L (ref 3.5–5.1)
SODIUM: 142 mmol/L (ref 135–145)

## 2015-05-13 LAB — PROTIME-INR
INR: 1.17 (ref 0.00–1.49)
PROTHROMBIN TIME: 15.1 s (ref 11.6–15.2)

## 2015-05-13 MED ORDER — WARFARIN SODIUM 3 MG PO TABS: ORAL_TABLET | ORAL | Status: DC

## 2015-05-13 MED ORDER — OXYCODONE HCL 5 MG PO TABS: 5.0000 mg | ORAL_TABLET | Freq: Four times a day (QID) | ORAL | Status: DC | PRN

## 2015-05-13 MED ORDER — WARFARIN SODIUM 5 MG PO TABS: 5.0000 mg | ORAL_TABLET | Freq: Once | ORAL | Status: DC

## 2015-05-13 MED ORDER — ASPIRIN 81 MG PO TBEC: 81.0000 mg | DELAYED_RELEASE_TABLET | Freq: Every day | ORAL | Status: DC

## 2015-05-13 MED ORDER — WARFARIN SODIUM 5 MG PO TABS
5.0000 mg | ORAL_TABLET | Freq: Once | ORAL | Status: AC
  Administered 2015-05-13: 5 mg via ORAL
  Filled 2015-05-13: qty 1

## 2015-05-13 NOTE — Progress Notes (Signed)
CS[facility]- CSW spoke with facility RN, Gershon Cullriscilla who confirmed that pt could return today- Gershon Cullriscilla will out of the office for the rest of the day but stated pt could come back and she would review paperwork tomorrow when she returns  CSW faxed DC and signed fl2 to the requested fax number  [facility] Anticipated discharge date:1/16 Family notified: son  Transportation by son  CSW signing off.  Merlyn LotJenna Holoman, LCSWA Clinical Social Worker [phone]

## 2015-05-13 NOTE — [place] FL2 (Signed)
[place] MEDICAID FL2 LEVEL OF CARE SCREENING TOOL     IDENTIFICATION  Patient Name: Kristina Diaz Birthdate: January 13, 1930 Sex: female Admission Date (Current Location): 05/10/2015  [facility] and [place] Number:  Producer, television/film/video and Address:  [facility], [address], [place], [place] [address]      Provider Number: [id number]  Attending Physician Name and Address:  Edsel Petrin, DO  Relative Name and Phone Number:       Current Level of Care: SNF Recommended Level of Care: Skilled Nursing Facility Prior Approval Number:    Date Approved/Denied:   PASRR Number:    Discharge Plan: Other (Comment) (Assisted Living Facility)    Current Diagnoses: Patient Active Problem List   Diagnosis Date Noted  . Syncope 05/10/2015  . Hypotension 05/10/2015  . Anemia, blood loss 05/10/2015  . Fall 05/10/2015  . Hematoma   . DVT (deep vein thrombosis) in pregnancy   . Critical lower limb ischemia   . Pressure ulcer 01/25/2015  . Blood poisoning (HCC)   . Atrial fibrillation with RVR (HCC)   . Chronic systolic CHF (congestive heart failure) (HCC)   . Cellulitis of left lower extremity   . Sepsis (HCC) 01/23/2015  . Leg ulcer, left (HCC)   . Encounter for therapeutic drug monitoring 05/31/2013  . Chronic combined systolic and diastolic heart failure (HCC) 12/10/2012  . Long term (current) use of anticoagulants 08/22/2012  . Chronic diastolic heart failure 08/18/2012  . Wound of lower extremity 08/08/2012  . Acute on chronic diastolic heart failure (HCC) 08/07/2012  . Hypothyroidism 08/07/2012  . Right heart failure (HCC) 08/04/2012  . Edema 07/26/2012  . Atherosclerotic peripheral vascular disease with ulceration (HCC) 06/30/2012  . PAD (peripheral artery disease) (HCC)   . Atrial fibrillation (HCC)   . Hypertension   . Thyroid disease     Orientation RESPIRATION BLADDER Height & Weight    Self, Time, Situation, Place  Normal Continent 5\' 4"   (162.6 cm) 151 lbs.  BEHAVIORAL SYMPTOMS/MOOD NEUROLOGICAL BOWEL NUTRITION STATUS      Continent Heart healthy diet  AMBULATORY STATUS COMMUNICATION OF NEEDS Skin   Limited Assist Verbally                        Personal Care Assistance Level of Assistance  Bathing, Dressing Bathing Assistance: Limited assistance   Dressing Assistance: Limited assistance     Functional Limitations Info             SPECIAL CARE FACTORS FREQUENCY  PT (By licensed PT)    Home Health to follow at ALF for PT 3x week                Contractures      Additional Factors Info  Code Status, Allergies Code Status Info: DNR Allergies Info: Bactrim Ds, Contrast Media, Ciprofloxacin, Tape           Current Medications (05/13/2015):  This is the current hospital active medication list Current Facility-Administered Medications  Medication Dose Route Frequency Provider Last Rate Last Dose  . 0.9 %  sodium chloride infusion   Intravenous Continuous Elson Areas, PA-C 125 mL/hr at 05/12/15 2215    . 0.9 %  sodium chloride infusion  10 mL/hr Intravenous Once Elson Areas, PA-C   10 mL/hr at 05/10/15 1330  . acetaminophen (TYLENOL) tablet 650 mg  650 mg Oral Q6H PRN Elson Areas, PA-C   650 mg at  05/12/15 1608   Or  . acetaminophen (TYLENOL) suppository 650 mg  650 mg Rectal Q6H PRN Elson AreasLeslie K Sofia, PA-C      . aspirin EC tablet 81 mg  81 mg Oral Daily Lonia SkinnerLeslie K AnnadaSofia, PA-C   81 mg at 05/13/15 [id number]  . collagenase (SANTYL) ointment 1 application  1 application Topical QODAY Maryann Mikhail, DO   1 application at 05/12/15 1541  . collagenase (SANTYL) ointment   Topical Daily Maryann Mikhail, DO      . docusate sodium (COLACE) capsule 100 mg  100 mg Oral Daily Lonia SkinnerLeslie K BlackeySofia, PA-C   100 mg at 05/13/15 [id number]  . levothyroxine (SYNTHROID, LEVOTHROID) tablet 37.5 mcg  37.5 mcg Oral QAC breakfast Lonia SkinnerLeslie K Leisure LakeSofia, PA-C   37.5 mcg at 05/13/15 [id number]  . loratadine (CLARITIN) tablet 10 mg  10 mg Oral Daily  Maryann Mikhail, DO   10 mg at 05/13/15 [id number]  . metoprolol succinate (TOPROL-XL) 24 hr tablet 12.5 mg  12.5 mg Oral Daily Maryann Mikhail, DO   12.5 mg at 05/13/15 [id number]  . multivitamin with minerals tablet 1 tablet  1 tablet Oral q morning - 10a Maryann Mikhail, DO   1 tablet at 05/13/15 [id number]  . oxyCODONE (Oxy IR/ROXICODONE) immediate release tablet 5 mg  5 mg Oral Q6H PRN Jinger NeighborsMary A Lynch, NP   5 mg at 05/12/15 2208  . potassium chloride SA (K-DUR,KLOR-CON) CR tablet 10 mEq  10 mEq Oral BID Elson AreasLeslie K Sofia, PA-C   10 mEq at 05/13/15 [id number]  . sodium chloride 0.9 % injection 3 mL  3 mL Intravenous Q12H Elson AreasLeslie K Sofia, PA-C   3 mL at 05/13/15 0926  . torsemide (DEMADEX) tablet 20 mg  20 mg Oral Daily Maryann Mikhail, DO   20 mg at 05/13/15 [id number]  . warfarin (COUMADIN) tablet 5 mg  5 mg Oral ONCE-1800 Para MarchNathan J WoodlakeBatchelder, RPH      . Warfarin - Pharmacist Dosing Inpatient   Does not apply q1800 Herby AbrahamMichelle T Bell, [facility]PH         Discharge Medications: Please see discharge summary for a list of discharge medications.  Relevant Imaging Results:  Relevant Lab Results:   Additional Information SS#: [id number]  [name], Isabele Lollar M, [place]

## 2015-05-13 NOTE — Plan of Care (Signed)
Problem: Safety: Goal: Ability to remain free from injury will improve Outcome: Progressing Pt understands to call for help when getting out of bed. No falls or injuries this shift. Patient educated about fall prevention. Pt verbalized understanding.    

## 2015-05-13 NOTE — Care Management Important Message (Signed)
Important Message  Patient Details  Name: Kristina Diaz MRN: [id number] Date of Birth: 08/11/1929   Medicare Important Message Given:  Yes    Bernadette HoitShoffner, Aneita Kiger Coleman 05/13/2015, 12:28 PM

## 2015-05-13 NOTE — Evaluation (Signed)
Physical Therapy Evaluation Patient Details Name: Kristina Diaz MRN: [id number] DOB: [birth year] Today's Date: 05/13/2015   History of Present Illness  Patient is a 82 y/o female with hx of L3-4 fx, PAD, A-fib, LLE DVT, s/p LLE angiogram and revascularization with plain balloon angioplasty 1/12; patient presents with syncope, workup was significant for anemia with hemoglobin of 7.9, and hypotension.  Clinical Impression  Patient presents with generalized weakness, pain in LLE/neck, balance deficits and recent angiogram of LLE impacting safe mobility. Pt has assist from spouse and staff at ALF as needed. Mostly uses w/c for mobility and is independent for transfers PTA. Pt now requiring assist to stand from EOB and to transfer Min A.  Pt fall risk. Would benefit from more assist at ALF and HHPT to maximize independence and mobility and improve strength so pt can return to PLOF.    Follow Up Recommendations Home health PT;Supervision for mobility/OOB    Equipment Recommendations  Wheelchair (measurements PT);Wheelchair cushion (measurements PT) (her w/c is old and the brake is broken.)    Recommendations for Other Services OT consult     Precautions / Restrictions Precautions Precautions: Fall Restrictions Weight Bearing Restrictions: No      Mobility  Bed Mobility Overal bed mobility: Needs Assistance Bed Mobility: Supine to Sit     Supine to sit: Min guard;HOB elevated     General bed mobility comments: Increased time, Min A to scoot bottom to EOB.   Transfers Overall transfer level: Needs assistance Equipment used: Rolling walker (2 wheeled) Transfers: Sit to/from Stand Sit to Stand: Mod assist;+2 physical assistance         General transfer comment: Mod A to boost from EOB with cues for hand placement/technique. Reluctant to place weight through LLE due to pain.  Ambulation/Gait Ambulation/Gait assistance: Min assist Ambulation Distance (Feet): 4 Feet Assistive  device: Rolling walker (2 wheeled) Gait Pattern/deviations: Decreased stance time - left;Decreased step length - right;Trunk flexed Gait velocity: decreased   General Gait Details: SLow, unsteady gait with pt reluctant to place weight through LLE due to pain. Forward trunk posturing due to kyphosis.  Stairs            Wheelchair Mobility    Modified Rankin (Stroke Patients Only)       Balance Overall balance assessment: Needs assistance Sitting-balance support: Feet supported;Bilateral upper extremity supported Sitting balance-Leahy Scale: Fair     Standing balance support: During functional activity Standing balance-Leahy Scale: Poor Standing balance comment: Reliant on RW for support.                              Pertinent Vitals/Pain Pain Assessment: Faces Faces Pain Scale: Hurts even more Pain Location: LLE Pain Descriptors / Indicators: Sore Pain Intervention(s): Monitored during session;Repositioned;Limited activity within patient's tolerance    Home Living Family/patient expects to be discharged to:: Assisted living               Home Equipment: Walker - 2 wheels;Wheelchair - manual;Hospital bed      Prior Function Level of Independence: Independent with assistive device(s)         Comments: lives in a 2 room apt [facility]. Mostly uses w/c for mobility. SOmetimes walks short distances with RW in apt.      Hand Dominance   Dominant Hand: Right    Extremity/Trunk Assessment   Upper Extremity Assessment: Defer to OT evaluation  Lower Extremity Assessment: Generalized weakness;LLE deficits/detail   LLE Deficits / Details: Erythema and redness distal to knee LLE     Communication   Communication: No difficulties  Cognition Arousal/Alertness: Awake/alert Behavior During Therapy: WFL for tasks assessed/performed Overall Cognitive Status: Within Functional Limits for tasks assessed                       General Comments General comments (skin integrity, edema, etc.): VSS throughout    Exercises        Assessment/Plan    PT Assessment Patient needs continued PT services  PT Diagnosis Difficulty walking;Generalized weakness;Acute pain   PT Problem List Decreased strength;Pain;Decreased activity tolerance;Decreased balance;Decreased mobility;Impaired sensation;Decreased safety awareness  PT Treatment Interventions Balance training;Gait training;Functional mobility training;Therapeutic activities;Therapeutic exercise;Wheelchair mobility training;Patient/family education   PT Goals (Current goals can be found in the Care Plan section) Acute Rehab PT Goals Patient Stated Goal: to go home PT Goal Formulation: With patient Time For Goal Achievement: 05/27/15 Potential to Achieve Goals: Good    Frequency Min 3X/week   Barriers to discharge Decreased caregiver support Has support from ALF staff as needed.    Co-evaluation               End of Session Equipment Utilized During Treatment: Gait belt Activity Tolerance: Patient tolerated treatment well Patient left: in chair;with call bell/phone within reach;with chair alarm set Nurse Communication: Mobility status         Time: [id number] PT Time Calculation (min) (ACUTE ONLY): 18 min   Charges:   PT Evaluation $PT Eval Moderate Complexity: 1 Procedure     PT G Codes:        Umar Patmon A Merlene Dante 05/13/2015, 11:26 AM  Mylo RedShauna Greenlee Ancheta, PT, DPT [phone]

## 2015-05-13 NOTE — Progress Notes (Signed)
ANTICOAGULATION CONSULT NOTE -   Pharmacy Consult for coumadin Indication: atrial fibrillation & hx DVT  Allergies  Allergen Reactions  . Bactrim Ds [Sulfamethoxazole-Trimethoprim] Rash  . Contrast Media [Iodinated Diagnostic Agents] Hives    BLE erythema   . Ciprofloxacin Rash  . Tape Rash    Patient Measurements: Height: 5\' 4"  (162.6 cm) Weight: 151 lb 3.8 oz (68.6 kg) IBW/kg (Calculated) : 54.7   Vital Signs: Temp: 97.8 F (36.6 C) (01/16 0354) Temp Source: Oral (01/16 0354) BP: 101/77 mmHg (01/16 0354) Pulse Rate: 85 (01/16 0354)  Labs:  Recent Labs  05/11/15 0330 05/11/15 1100 05/12/15 0350 05/13/15 0318  HGB 8.7*  --  9.0* 9.0*  HCT 26.6*  --  27.8* 28.5*  PLT 146*  --  138* 131*  LABPROT  --  19.8* 16.5* 15.1  INR  --  1.68* 1.32 1.17  CREATININE 1.19*  --  0.95 1.00    Estimated Creatinine Clearance: 40.6 mL/min (by C-G formula based on Cr of 1).   Assessment: 82 yo F admitted s/p fall at home. Coum 4.5mg  daily exc 3mg  on Tues/Fri [facility], held x 1 week for procedure. Had anemia 2nd to blood loss from hematoma. 2 units of PRBCs given. 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 Coumad resumed per Rx 1/14. Doppler on 1/13 negative for DVT. INR is low at 1.17 after 2 doses. Hgb low but stable at 9.0, plts 131. No s/s of bleed.  Goal of Therapy:  INR 2-3   Plan:  Give coumadin 5mg  PO x 1 tonight Monitor daily INR, CBC, s/s of bleed  Enzo BiNathan Kiegan Macaraeg, PharmD, [facility] Clinical Pharmacist Pager [phone] 05/13/2015 8:56 AM

## 2015-05-13 NOTE — Discharge Summary (Signed)
Physician Discharge Summary  Kristina Diaz ZOX:[id number] DOB: [birth year] DOA: 05/10/2015  PCP: [name], MD  Admit date: 05/10/2015 Discharge date: 05/13/2015  Time spent: 45 minutes  Recommendations for Outpatient Follow-up:  Patient will be discharged to assisted living facility with home health PT.  Patient will need to follow up with primary care provider within one week of discharge, repeat CBC and BMP in one week.  Check INR in 3 days.  Patient will also need to follow up with Dr. Loreta Ave, radiology, in 2-4 weeks. Patient should continue medications as prescribed.  Patient should follow a Heart healthy diet.   Discharge Diagnoses:  Anemia secondary to blood loss/hematoma Hypertension Chronic systolic heart failure Acute kidney injury Peripheral arterial disease Hypothyroidism Fall  Discharge Condition: Stable  Diet recommendation: Heart healthy  Filed Weights   05/11/15 0146 05/12/15 0418 05/13/15 0355  Weight: 63.9 kg (140 lb 14 oz) 67.6 kg (149 lb 0.5 oz) 68.6 kg (151 lb 3.8 oz)    History of present illness:  on 05/10/2015 by Kristina Diaz, Kristina Dynasty Diaz is a 82 y.o. female  Who has a history of PAD, afib,HTN DVT. Pt had an arteriogram done yesterday due to occluded popliteal artery At and peroneal artery. Pt returned to Assisted living facility and became dizzy falling to the floor this am. Patients family reports no loss of conciousness however pt was confused after fall. Pt injured her right forearm. Pt has 2 skin tears treated in ED. Pt complained of pain to her low back. Ct abdomen showed no evidence of retroperitoneal bleeding, no evidence of injury from fall. Pt had a ct of her head due to fall and confusion after fall. Ct showed no evidence of acute injury Pt has had low blood pressures since arriving in ED. She has received 1500cc of fluids. Pt has an EF of 45. Pt had an echo 9/16 Pt continues to be hypotensive.Pt's hemoglobin has dropped From  10 to 7.9. She has a hemotoma to the left groin from procedure. Radiology did an ultrasound after procedure and reports no anyuerism. Pt was placed on plavix yesterday. She previously was on coumadin for afib until 5 days ago when she was asked to stop before procedure.   Hospital Course:  Anemia secondary to blood loss/Hematoma -Secondary to "hematoma post procedural -Patient received 2uPRBCs -Hemoglobin on admission 7.9, currently 9 -Plavix discontinued -Repeat CBC in one week  Hypotension -Resolved, likely secondary to volume depletion and anemia   Chronic systolic CHF -Echocardiogram 01/24/2015 showed EF 40-45%  -continue to monitor intake, output, daily weights, as patient received aggressive hydration upon admission  -Continue home medications at discharge  Acute kidney injury -Resolved -Secondary to hypotension, likely prerenal  -creatinine upon admission 1.83, improving to 1 today   History of LLE DVT -2014 s/p thrombectomy -Lower extremity Doppler: Negative for DVT, superficial thrombosis, Baker's cyst   Atrial fibrillation -Currently rate controlled -CHADSVASC 8 (based on age, gender, CHF, h/o DVT, HTN, PAD) -Given patient's risk factors and CHADSVASC score, spoke with cardiology, Dr. Gala Romney, regarding [facility]. He agreed, we can restart couamdin and low dose ASA and monitor H/H closely -Will restart Coumadin per pharmacy this evening and monitor H/H closely  PAD -S/p revascularization of left popliteal artery  -interventional radiology consulted and appreciated, recommended patient use full dose aspirin for 30 days; follow up in 2-4 weeks  Hypothyroidism -Continue Synthroid   Fall -PT and OT consulted -PT recommended home health  Code Status: DNR  Procedures  None  Consults  Interventional radiology Cardiology, Dr. Gala Romney, via phone  Discharge Exam: Filed Vitals:   05/13/15 0354 05/13/15 0818  BP: 101/77 113/65  Pulse: 85 79  Temp:  97.8 F (36.6 C) 97.8 F (36.6 C)  Resp: 23 19    Exam  General: Well developed, well nourished, NAD,  HEENT: NCAT, mucous membranes moist.   Cardiovascular: S1 S2 auscultated, irregular  Respiratory: Clear to auscultation bilaterally   Abdomen: Soft, nontender, nondistended, + bowel sounds  Extremities: warm dry without cyanosis clubbing. Ecchymosis left groin/thigh  Neuro: AAOx3, nonfocal  Skin: Without rashes exudates or nodules- redness from IV contrast allergy  Psych: Normal affect and demeanor, pleasant  Discharge Instructions      Discharge Instructions    Discharge instructions    Complete by:  As directed   Patient will be discharged to assisted living facility with home health PT.  Patient will need to follow up with primary care provider within one week of discharge, repeat CBC and BMP in one week.  Check INR in 3 days.  Patient will also need to follow up with Dr. Loreta Ave, radiology, in 2-4 weeks. Patient should continue medications as prescribed.  Patient should follow a Heart healthy diet.            Medication List    STOP taking these medications        nystatin powder  Commonly known as:  MYCOSTATIN     oxyCODONE 10 mg 12 hr tablet  Commonly known as:  OXYCONTIN  Replaced by:  oxyCODONE 5 MG immediate release tablet      TAKE these medications        aspirin 81 MG EC tablet  Take 1 tablet (81 mg total) by mouth daily.     cetirizine 10 MG tablet  Commonly known as:  ZYRTEC  Take 1 tablet (10 mg total) by mouth 2 (two) times daily. X 14 days     docusate sodium 100 MG capsule  Commonly known as:  COLACE  Take 100 mg by mouth daily.     levothyroxine 25 MCG tablet  Commonly known as:  SYNTHROID, LEVOTHROID  TAKE 1 AND 1/2 TABS (37.5MG ) BY MOUTH EVERY DAY*CHECK PULSE WEEKLY*     metoprolol succinate 25 MG 24 hr tablet  Commonly known as:  TOPROL-XL  Take 0.5 tablets (12.5 mg total) by mouth daily.     multivitamin with minerals Tabs  tablet  Take 1 tablet by mouth every morning.     oxyCODONE 5 MG immediate release tablet  Commonly known as:  Oxy IR/ROXICODONE  Take 1 tablet (5 mg total) by mouth every 6 (six) hours as needed for severe pain.     potassium chloride 10 MEQ tablet  Commonly known as:  K-DUR,KLOR-CON  Take 1 tablet (10 mEq total) by mouth 2 (two) times daily.     predniSONE 50 MG tablet  Commonly known as:  DELTASONE  Take 50 mg by mouth once.     SANTYL ointment  Generic drug:  collagenase  Apply 1 application topically every other day. To bilateral lower extremities wounds     silver sulfADIAZINE 1 % cream  Commonly known as:  SILVADENE  Apply 1 application topically 2 (two) times daily. Apply to wounds 1-2 times daily as directed.     torsemide 20 MG tablet  Commonly known as:  DEMADEX  Take 20 mg by mouth every Monday, Wednesday, and Friday. At 5 pm in addition to daily torsemide 20mg   torsemide 20 MG tablet  Commonly known as:  DEMADEX  TAKE 1 TAB BY MOUTH EVERY DAY -- DX: EDEMA AND TAKE 1 TAB BY MOUTH AT 5PM ON MON WED FRI (THIS IS.IN ADDITION TO MORNING SCHEDULE DOSE) -...     warfarin 3 MG tablet  Commonly known as:  COUMADIN  Take 4.5mg  (one and a half pills) everyday except Tuesday and Friday. Take 3mg  (one pill) on Tuesday and Friday.  Start taking on:  05/14/2015       Allergies  Allergen Reactions  . Bactrim Ds [Sulfamethoxazole-Trimethoprim] Rash  . Contrast Media [Iodinated Diagnostic Agents] Hives    BLE erythema   . Ciprofloxacin Rash  . Tape Rash      The results of significant diagnostics from this hospitalization (including imaging, microbiology, ancillary and laboratory) are listed below for reference.    Significant Diagnostic Studies: Ct Abdomen Pelvis Wo Contrast  05/10/2015  CLINICAL DATA:  82 year old female with severe back pain concern for retroperitoneal hemorrhage. EXAM: CT ABDOMEN AND PELVIS WITHOUT CONTRAST TECHNIQUE: Multidetector CT imaging of  the abdomen and pelvis was performed following the standard protocol without IV contrast. COMPARISON:  Radiograph dated 05/10/2015 and CT dated 04/25/2015 FINDINGS: Evaluation of this exam is limited in the absence of intravenous contrast. Evaluation is also limited due to streak artifact caused by patient's arms. Mild bibasilar linear atelectatic changes. There is cardiomegaly. There is coronary vascular calcification. No intra-abdominal free air. No free fluid. The liver appears unremarkable. High attenuating material within the gallbladder may represent vicariously excreted contrast from recent study or small gallstones. The pancreas is not well visualized. A 1.6 cm stable appearing hypodensity is again noted in the region of the head of the pancreas similar to the prior study. The spleen appears unremarkable. The adrenal glands are not well visualized. There is stable appearing low positioning and malrotation of the right kidney. There is no hydronephrosis on either side. There is excreted contrast in the right renal pelvis and within the urinary bladder. The uterus is grossly unremarkable. There is apparent thickening of the wall of the stomach which may be related to underdistention. Gastritis is not excluded. Clinical correlation is recommended. Constipation. Dense stool within the rectum may represent a degree of fecal impaction. There is no evidence of bowel obstruction. There is atherosclerotic calcification of the aorta. No portal venous gas identified. There is no lymphadenopathy. No definite intraperitoneal or retroperitoneal hemorrhage noted. There is diffuse subcutaneous soft tissue stranding. Dilated and serpiginous collateral vasculature noted in the subcutaneous soft tissue of the right anterior abdominal wall. There is diffuse stranding of the subcutaneous tissues of the gluteal and perineal region. No fluid collection or hematoma identified. There is osteopenia. Degenerative changes of the spine.  There is L2 compression fracture with near complete loss of vertebral body height and anterior wedging similar to prior study. No acute fracture. IMPRESSION: No definite retroperitoneal or intraperitoneal hematoma identified. No hematoma seen in the visualized portions of the lower extremities. Electronically Signed   By: Elgie CollardArash  Radparvar M.D.   On: 05/10/2015 06:10   Dg Chest 2 View  05/10/2015  CLINICAL DATA:  82 year old female with hypotension and heart failure. EXAM: CHEST  2 VIEW COMPARISON:  Chest radiograph dated 01/23/2015 FINDINGS: Two views of the chest demonstrate stable cardiomegaly. No focal consolidation, pleural effusion, or pneumothorax. The osseous structures are grossly unremarkable. IMPRESSION: Stable cardiomegaly.  No acute cardiopulmonary process. Electronically Signed   By: Elgie CollardArash  Radparvar M.D.   On: 05/10/2015 06:19  Dg Lumbar Spine Complete  05/10/2015  CLINICAL DATA:  Fall, diffuse pain. EXAM: LUMBAR SPINE - COMPLETE 4+ VIEW COMPARISON:  Reformats from CT lower extremity angiogram 04/25/2015 FINDINGS: Severe compression deformity of L2 is unchanged from prior exam. There is otherwise no acute fracture. Mild anterolisthesis of L5 on S1 is unchanged from CT. Multilevel facet arthropathy is seen. Significant osteoporosis. Excreted contrast within kidneys and urinary bladder from angiogram yesterday. IMPRESSION: Chronic compression deformity of L3. No definite acute fracture. Severe osteoporosis. Electronically Signed   By: Rubye Oaks M.D.   On: 05/10/2015 03:32   Ct Head Wo Contrast  05/10/2015  CLINICAL DATA:  Lost her balance, fall to ground. Neck pain. Slurred speech. EXAM: CT HEAD WITHOUT CONTRAST CT CERVICAL SPINE WITHOUT CONTRAST TECHNIQUE: Multidetector CT imaging of the head and cervical spine was performed following the standard protocol without intravenous contrast. Multiplanar CT image reconstructions of the cervical spine were also generated. COMPARISON:  None.  FINDINGS: CT HEAD FINDINGS No intracranial hemorrhage, mass effect, or midline shift. Generalized atrophy and chronic small vessel ischemia. No hydrocephalus. Portions of the skullbase are obscured by streak artifact from metallic dentures/dental hardware. The basilar cisterns are patent. No evidence of territorial infarct. No intracranial fluid collection. Calvarium is intact. Right maxillary sinuses moderate of, no paranasal sinus inflammatory change. Mild sclerosis but inferior left mastoid air cells. CT CERVICAL SPINE FINDINGS Exaggerated cervical lordosis. Diffuse osteoporosis. Vertebral body heights are preserved. There is no fracture. The dens is intact. There are no jumped or perched facets. Disc space narrowing at C5-C6. Mild multilevel facet arthropathy. No prevertebral soft tissue edema. The esophagus appears dilated were visualized. IMPRESSION: 1. Atrophy and chronic small vessel ischemia without acute intracranial abnormality. 2. Degenerative change in osteoporosis in the cervical spine. No acute fracture or subluxation. Electronically Signed   By: Rubye Oaks M.D.   On: 05/10/2015 03:50   Ct Cervical Spine Wo Contrast  05/10/2015  CLINICAL DATA:  Lost her balance, fall to ground. Neck pain. Slurred speech. EXAM: CT HEAD WITHOUT CONTRAST CT CERVICAL SPINE WITHOUT CONTRAST TECHNIQUE: Multidetector CT imaging of the head and cervical spine was performed following the standard protocol without intravenous contrast. Multiplanar CT image reconstructions of the cervical spine were also generated. COMPARISON:  None. FINDINGS: CT HEAD FINDINGS No intracranial hemorrhage, mass effect, or midline shift. Generalized atrophy and chronic small vessel ischemia. No hydrocephalus. Portions of the skullbase are obscured by streak artifact from metallic dentures/dental hardware. The basilar cisterns are patent. No evidence of territorial infarct. No intracranial fluid collection. Calvarium is intact. Right  maxillary sinuses moderate of, no paranasal sinus inflammatory change. Mild sclerosis but inferior left mastoid air cells. CT CERVICAL SPINE FINDINGS Exaggerated cervical lordosis. Diffuse osteoporosis. Vertebral body heights are preserved. There is no fracture. The dens is intact. There are no jumped or perched facets. Disc space narrowing at C5-C6. Mild multilevel facet arthropathy. No prevertebral soft tissue edema. The esophagus appears dilated were visualized. IMPRESSION: 1. Atrophy and chronic small vessel ischemia without acute intracranial abnormality. 2. Degenerative change in osteoporosis in the cervical spine. No acute fracture or subluxation. Electronically Signed   By: Rubye Oaks M.D.   On: 05/10/2015 03:50   Ir Angiogram Extremity Left  05/09/2015  CLINICAL DATA:  82 year old female with a history of chronic nonhealing wound of the left lower extremity. She has a vague history of what sounds like embolism to the popliteal artery and surgical thrombectomy in [place] many years prior. Prior inpatient noninvasive study  demonstrates abnormal waveforms distally, with nonpalpable pulses of the foot. CT angiogram and runoff confirms a popliteal occlusion as well as tibial occlusions. She presents today for angiogram and attempt at revascularization for wound healing capability. EXAM: ULTRASOUND GUIDED ACCESS LEFT SUPERFICIAL FEMORAL ARTERY ULTRASOUND GUIDED ACCESS LEFT DORSALIS PEDIS REVASCULARIZATION OF POPLITEAL OCCLUSION WITH BALLOON ANGIOPLASTY OF POPLITEAL ARTERY REVASCULARIZATION OF LEFT ANTERIOR TIBIAL ARTERY WITH BALLOON ANGIOPLASTY REVASCULARIZATION OF LEFT TIBIOPERONEAL TRUNK AND PERONEAL ARTERY WITH BALLOON ANGIOPLASTY FLUOROSCOPY TIME:  25 minutes 54 seconds MEDICATIONS AND MEDICAL HISTORY: 2 mg intra-arterial tPA, 800 mcg nitroglycerin, 2.5 mg verapamil intra arterial ANESTHESIA/SEDATION: 2.5 mg Versed, 125 mcg fentanyl 180 minutes sedation time CONTRAST:  VISIPAQUE IODIXANOL 320  MG/ML IV SOLN COMPLICATIONS: Small puncture site hematoma at the left thigh.  SIR category 1. PROCEDURE: The procedure, risks, benefits, and alternatives were explained to the patient and the patient's family. Specific risks that were addressed included bleeding, infection, contrast reaction, kidney injury, need for further procedure, limb loss, cardiopulmonary collapse, death. Questions regarding the procedure were encouraged and answered. The patient understands and consents to the procedure. Proximal left thigh was prepped and draped in the usual sterile fashion. The left ankle was prepped and draped in the usual sterile fashion. Ultrasound survey of the left proximal thigh was performed with images stored and sent to PACs. A micropuncture needle was used access the left superficial femoral artery under ultrasound in antegrade fashion. With excellent arterial blood flow returned, and an .018 micro wire was passed through the needle, observed to enter the SFA under fluoroscopy. The needle was removed, and a micropuncture sheath was placed over the wire. The inner dilator and wire were removed, and an 035 Bentson wire was advanced under fluoroscopy. The sheath was removed and a standard 5 [place] vascular sheath was placed. The dilator was removed and the sheath was flushed. Angiogram was performed of the left lower extremity. Ultrasound survey of the left dorsalis pedis was performed with images stored and sent to PACs. A micropuncture needle was used to access the left dorsalis pedis artery under ultrasound. With arterial blood flow returned, and an .018 micro wire was passed through the needle, observed to enter the distal anterior tibial artery under fluoroscopy. The needle was removed, and a micropuncture sheath was placed over the wire. The inner dilator and wire were removed. A pharmacologic cocktail was infused. Stiff Glidewire was then passed through the sheath in a retrograde fashion to the anterior tibial  artery, traversing the occlusion at the proximal anterior tibial artery and at the popliteal artery. The wire stayed luminal in the superficial femoral artery. The SFA sheath was then upsized to 6 [place]. A snare was used to snare the Glidewire from above. 035 quick cross catheter was then passed over the Glidewire to the distal anterior tibial artery. Down size to 018 system. Balloon angioplasty was then performed along the length of the anterior tibial artery with 2.0 mm diameter balloon, 2.5 mm diameter balloon, 3.0 mm diameter balloon. 2 mm balloon was used for hemostasis at the puncture site. Antegrade approach to the stump of the tibioperoneal trunk was used with a combination of Glidewire and quick cross catheter. Balloon angioplasty was performed with 2.5 mm diameter balloon and 3.0 mm diameter balloon at the origin, with restoration of flow. Spasm was induced in the mid and distal peroneal artery. Pedal pulse was confirmed at the conclusion of the case, which was not present at the initiation. All catheters and wires were removed.  Sheath was removed from the superficial femoral artery, with manual pressure used for hemostasis. Patient tolerated the procedure well and remained hemodynamically stable throughout. No complications were encountered and no significant blood loss was encountered. FINDINGS: No palpable pulse at the initiation of the case. Doppler signals were present. Initial angiogram demonstrates no significant disease of the superficial femoral artery. Occlusion of the popliteal artery at the joint space of the knee. Occlusion extends through the popliteal artery, tibioperoneal trunk, and proximal anterior tibial artery. Reconstitution of the distal anterior tibial artery and of the peroneal artery. No posterior tibial filling was present. After revascularization, there is in-line flow through the popliteal artery, anterior tibial artery, and peroneal artery to the ankle. Spasm induced in the  mid and distal peroneal artery, with palpable pulse at the completion of the angiogram/treatment. IMPRESSION: Status post left lower extremity angiogram with antegrade and retrograde approach to popliteal occlusion and revascularization with plain balloon angioplasty restoring in-line flow through the popliteal artery, peroneal artery and anterior tibial artery to the ankle. Signed, Yvone Neu. Loreta Ave DO [facility] Radiology PLAN: p.o. Plavix 300 mg now. Left leg straight for 5 hours. Initiate 75 mg p.o. Plavix daily. Electronically Signed   By: Gilmer Mor D.O.   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 She has a vague history of what sounds like embolism to the popliteal artery and surgical thrombectomy in [place] many years prior. Prior inpatient noninvasive study demonstrates abnormal waveforms distally, with nonpalpable pulses of the foot. CT angiogram and runoff confirms a popliteal occlusion as well as tibial occlusions. She presents today for angiogram and attempt at revascularization for wound healing capability. EXAM: ULTRASOUND GUIDED ACCESS LEFT SUPERFICIAL FEMORAL ARTERY ULTRASOUND GUIDED ACCESS LEFT DORSALIS PEDIS REVASCULARIZATION OF POPLITEAL OCCLUSION WITH BALLOON ANGIOPLASTY OF POPLITEAL ARTERY REVASCULARIZATION OF LEFT ANTERIOR TIBIAL ARTERY WITH BALLOON ANGIOPLASTY REVASCULARIZATION OF LEFT TIBIOPERONEAL TRUNK AND PERONEAL ARTERY WITH BALLOON ANGIOPLASTY FLUOROSCOPY TIME:  25 minutes 54 seconds MEDICATIONS AND MEDICAL HISTORY: 2 mg intra-arterial tPA, 800 mcg nitroglycerin, 2.5 mg verapamil intra arterial ANESTHESIA/SEDATION: 2.5 mg Versed, 125 mcg fentanyl 180 minutes sedation time CONTRAST:  VISIPAQUE IODIXANOL 320 MG/ML IV SOLN COMPLICATIONS: Small puncture site hematoma at the left thigh.  SIR category 1.  PROCEDURE: The procedure, risks, benefits, and alternatives were explained to the patient and the patient's family. Specific risks that were addressed included bleeding, infection, contrast reaction, kidney injury, need for further procedure, limb loss, cardiopulmonary collapse, death. Questions regarding the procedure were encouraged and answered. The patient understands and consents to the procedure. Proximal left thigh was prepped and draped in the usual sterile fashion. The left ankle was prepped and draped in the usual sterile fashion. Ultrasound survey of the left proximal thigh was performed with images stored and sent to PACs. A micropuncture needle was used access the left superficial femoral artery under ultrasound in antegrade fashion. With excellent arterial blood flow returned, and an .018 micro wire was passed through the needle, observed to enter the SFA under fluoroscopy. The needle was removed, and a micropuncture sheath was placed over the wire. The inner dilator and wire were removed, and an 035 Bentson wire was advanced under fluoroscopy. The sheath was removed and a standard 5 [place] vascular sheath was placed. The dilator was removed and the sheath was flushed. Angiogram was performed of the  left lower extremity. Ultrasound survey of the left dorsalis pedis was performed with images stored and sent to PACs. A micropuncture needle was used to access the left dorsalis pedis artery under ultrasound. With arterial blood flow returned, and an .018 micro wire was passed through the needle, observed to enter the distal anterior tibial artery under fluoroscopy. The needle was removed, and a micropuncture sheath was placed over the wire. The inner dilator and wire were removed. A pharmacologic cocktail was infused. Stiff Glidewire was then passed through the sheath in a retrograde fashion to the anterior tibial artery, traversing the occlusion at the proximal anterior tibial artery and at the popliteal  artery. The wire stayed luminal in the superficial femoral artery. The SFA sheath was then upsized to 6 [place]. A snare was used to snare the Glidewire from above. 035 quick cross catheter was then passed over the Glidewire to the distal anterior tibial artery. Down size to 018 system. Balloon angioplasty was then performed along the length of the anterior tibial artery with 2.0 mm diameter balloon, 2.5 mm diameter balloon, 3.0 mm diameter balloon. 2 mm balloon was used for hemostasis at the puncture site. Antegrade approach to the stump of the tibioperoneal trunk was used with a combination of Glidewire and quick cross catheter. Balloon angioplasty was performed with 2.5 mm diameter balloon and 3.0 mm diameter balloon at the origin, with restoration of flow. Spasm was induced in the mid and distal peroneal artery. Pedal pulse was confirmed at the conclusion of the case, which was not present at the initiation. All catheters and wires were removed. Sheath was removed from the superficial femoral artery, with manual pressure used for hemostasis. Patient tolerated the procedure well and remained hemodynamically stable throughout. No complications were encountered and no significant blood loss was encountered. FINDINGS: No palpable pulse at the initiation of the case. Doppler signals were present. Initial angiogram demonstrates no significant disease of the superficial femoral artery. Occlusion of the popliteal artery at the joint space of the knee. Occlusion extends through the popliteal artery, tibioperoneal trunk, and proximal anterior tibial artery. Reconstitution of the distal anterior tibial artery and of the peroneal artery. No posterior tibial filling was present. After revascularization, there is in-line flow through the popliteal artery, anterior tibial artery, and peroneal artery to the ankle. Spasm induced in the mid and distal peroneal artery, with palpable pulse at the completion of the  angiogram/treatment. IMPRESSION: Status post left lower extremity angiogram with antegrade and retrograde approach to popliteal occlusion and revascularization with plain balloon angioplasty restoring in-line flow through the popliteal artery, peroneal artery and anterior tibial artery to the ankle. Signed, Yvone Neu. Loreta Ave DO [facility] PLAN: p.o. Plavix 300 mg now. Left leg straight for 5 hours. Initiate 75 mg p.o. Plavix daily. Electronically Signed   By: Gilmer Mor D.O.   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 She has a vague history of what sounds like embolism to the popliteal artery and surgical thrombectomy in [place] many years prior. Prior inpatient noninvasive study demonstrates abnormal waveforms distally, with nonpalpable pulses of the foot. CT angiogram and runoff confirms a popliteal occlusion as well as tibial occlusions. She presents today for angiogram and attempt at revascularization for wound healing capability. EXAM: ULTRASOUND GUIDED ACCESS LEFT SUPERFICIAL FEMORAL ARTERY ULTRASOUND GUIDED ACCESS LEFT DORSALIS PEDIS  REVASCULARIZATION OF POPLITEAL OCCLUSION WITH BALLOON ANGIOPLASTY OF POPLITEAL ARTERY REVASCULARIZATION OF LEFT ANTERIOR TIBIAL ARTERY WITH BALLOON ANGIOPLASTY REVASCULARIZATION OF LEFT TIBIOPERONEAL TRUNK AND PERONEAL ARTERY WITH BALLOON ANGIOPLASTY FLUOROSCOPY TIME:  25 minutes 54 seconds MEDICATIONS AND MEDICAL HISTORY: 2 mg intra-arterial tPA, 800 mcg nitroglycerin, 2.5 mg verapamil intra arterial ANESTHESIA/SEDATION: 2.5 mg Versed, 125 mcg fentanyl 180 minutes sedation time CONTRAST:  VISIPAQUE IODIXANOL 320 MG/ML IV SOLN COMPLICATIONS: Small puncture site hematoma at the left thigh.  SIR category 1. PROCEDURE: The procedure, risks, benefits, and alternatives were explained  to the patient and the patient's family. Specific risks that were addressed included bleeding, infection, contrast reaction, kidney injury, need for further procedure, limb loss, cardiopulmonary collapse, death. Questions regarding the procedure were encouraged and answered. The patient understands and consents to the procedure. Proximal left thigh was prepped and draped in the usual sterile fashion. The left ankle was prepped and draped in the usual sterile fashion. Ultrasound survey of the left proximal thigh was performed with images stored and sent to PACs. A micropuncture needle was used access the left superficial femoral artery under ultrasound in antegrade fashion. With excellent arterial blood flow returned, and an .018 micro wire was passed through the needle, observed to enter the SFA under fluoroscopy. The needle was removed, and a micropuncture sheath was placed over the wire. The inner dilator and wire were removed, and an 035 Bentson wire was advanced under fluoroscopy. The sheath was removed and a standard 5 [place] vascular sheath was placed. The dilator was removed and the sheath was flushed. Angiogram was performed of the left lower extremity. Ultrasound survey of the left dorsalis pedis was performed with images stored and sent to PACs. A micropuncture needle was used to access the left dorsalis pedis artery under ultrasound. With arterial blood flow returned, and an .018 micro wire was passed through the needle, observed to enter the distal anterior tibial artery under fluoroscopy. The needle was removed, and a micropuncture sheath was placed over the wire. The inner dilator and wire were removed. A pharmacologic cocktail was infused. Stiff Glidewire was then passed through the sheath in a retrograde fashion to the anterior tibial artery, traversing the occlusion at the proximal anterior tibial artery and at the popliteal artery. The wire stayed luminal in the superficial femoral artery. The SFA  sheath was then upsized to 6 [place]. A snare was used to snare the Glidewire from above. 035 quick cross catheter was then passed over the Glidewire to the distal anterior tibial artery. Down size to 018 system. Balloon angioplasty was then performed along the length of the anterior tibial artery with 2.0 mm diameter balloon, 2.5 mm diameter balloon, 3.0 mm diameter balloon. 2 mm balloon was used for hemostasis at the puncture site. Antegrade approach to the stump of the tibioperoneal trunk was used with a combination of Glidewire and quick cross catheter. Balloon angioplasty was performed with 2.5 mm diameter balloon and 3.0 mm diameter balloon at the origin, with restoration of flow. Spasm was induced in the mid and distal peroneal artery. Pedal pulse was confirmed at the conclusion of the case, which was not present at the initiation. All catheters and wires were removed. Sheath was removed from the superficial femoral artery, with manual pressure used for hemostasis. Patient tolerated the procedure well and remained hemodynamically stable throughout. No complications were encountered and no significant blood loss was encountered. FINDINGS: No palpable pulse at the initiation of the case. Doppler signals were present. Initial  angiogram demonstrates no significant disease of the superficial femoral artery. Occlusion of the popliteal artery at the joint space of the knee. Occlusion extends through the popliteal artery, tibioperoneal trunk, and proximal anterior tibial artery. Reconstitution of the distal anterior tibial artery and of the peroneal artery. No posterior tibial filling was present. After revascularization, there is in-line flow through the popliteal artery, anterior tibial artery, and peroneal artery to the ankle. Spasm induced in the mid and distal peroneal artery, with palpable pulse at the completion of the angiogram/treatment. IMPRESSION: Status post left lower extremity angiogram with antegrade  and retrograde approach to popliteal occlusion and revascularization with plain balloon angioplasty restoring in-line flow through the popliteal artery, peroneal artery and anterior tibial artery to the ankle. Signed, Yvone Neu. Loreta Ave DO [facility] PLAN: p.o. Plavix 300 mg now. Left leg straight for 5 hours. Initiate 75 mg p.o. Plavix daily. Electronically Signed   By: Gilmer Mor D.O.   On: 05/09/2015 16:00   Ct Angio Ao+bifem W/cm &/or Wo/cm  04/25/2015  CLINICAL DATA:  82 year old female with a history of a chronic wound of the left lower extremity. Previous noninvasive arterial examination demonstrates arterial occlusive disease. EXAM: CT ANGIOGRAPHY OF ABDOMINAL AORTA WITH ILIOFEMORAL RUNOFF TECHNIQUE: Multidetector CT imaging of the abdomen, pelvis and lower extremities was performed using the standard protocol during bolus administration of intravenous contrast. Multiplanar CT image reconstructions and MIPs were obtained to evaluate the vascular anatomy. CONTRAST:  OMNIPAQUE IOHEXOL 350 MG/ML SOLN COMPARISON:  MRI 01/23/2015 FINDINGS: Vascular: Aorta: Scattered calcified and soft plaque throughout the visualized lower thoracic and abdominal aorta. No aneurysm. There is a linear hypodensity associated with the right aspect of the aorta at the L3 level along the right aspect of the intima, potentially a chronic dissection or chronic fold given the patient's position. No periaortic fluid or inflammatory changes. Mesenteric vasculature: Calcifications at the origin of the celiac artery and superior mesenteric artery. There is likely at least 50% narrowing of the celiac artery origin though the branch vessels are patent. Single left renal artery origin is patent. Single right renal artery origin is patent, with calcifications at the origin with likely 50% stenosis. Inferior mesenteric artery origin is patent. Vein vasculature: Uncertain  patency of the IVC and iliac veins, as the timing of the contrast bolus does not opacified the venous system. Right Lower Extremity: Scattered calcifications of the right iliac system. Normal course caliber and contour of the right iliac system with no aneurysm or dissection flap. Right hypogastric artery is patent including the anterior and posterior divisions. Right common femoral artery patent. Right profunda femoris is patent. Length of the superficial femoral artery is patent with no significant atherosclerotic changes or stenosis. Popliteal artery patent. Anterior tibial artery patent from the origin to the ankle. Posterior tibial artery and peroneal artery appear patent from the origin to the ankle. Left Lower Extremity: Minimal atherosclerotic changes of the left iliac system. Normal course caliber and contour with no dissection or aneurysm. Hypogastric arteries patent including anterior posterior divisions. External iliac artery patent. Common femoral artery patent. Profunda femoris patent. Length of the superficial femoral artery is patent through the adductor canal. No significant atherosclerotic changes. The popliteal artery occludes approximately at the knee joint. There are surgical clips and surgical changes within the soft tissues at the site of occlusion of the popliteal artery. Geniculate arteries/collaterals contribute to reconstitution of the tibial artery in the proximal third and the peroneal artery in  the proximal third. No significant filling of the posterior tibial artery. Additional: Multiple tortuous venous structures in the superficial tissues of the left lower extremity. Nonvascular: Lower chest: Collateral venous drainage of the right greater than left chest wall and abdominal wall. No axillary adenopathy. Respiratory motion somewhat limits evaluation of the lung bases. No confluent airspace disease or pleural effusion. Minimal atelectasis. Heart is enlarged, with pronounced enlargement  of the left atrium and right atrium. Trace calcifications of the aortic valve. Small hiatal hernia. Abdomen/pelvis: Unremarkable appearance of liver, spleen, and adrenal glands. No abnormally distended small bowel or colon. Appendix is not visualized, however, no inflammatory changes are present adjacent to the cecum to indicate an appendicitis. Low-density/cystic structure in the head of the pancreas associated with the bile ducts with greatest diameter of 17 mm. Questionable ductal dilatation of the pancreatic duct at the pancreatic body and pancreatic tail. No free fluid.  No free intraperitoneal air. Unremarkable appearance of the urinary bladder. Unremarkable appearance of the uterus and adnexa. Symmetric enhancement of the bilateral kidneys. No nephrolithiasis. No hydronephrosis. 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 Nonspecific pelvic floor laxity. Musculoskeletal: Borderline enlarged lymph nodes of the bilateral inguinal region and of the left popliteal region, presumably reactive given the patient's known chronic wound of the left lower extremity. Diffuse osteopenia. Accentuated kyphotic curvature of the thoracic spine is evident given the positioning. Compression fracture of L2 is essentially unchanged from the plain film dated 12/15/2012. No significant bony canal narrowing. Multilevel degenerative disc disease throughout the visualized spine. Advanced degenerative changes of the L5-S1 level with associated facet disease. No acute displaced fracture. 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 Soft tissue changes of the left calf anterior and posterior, compatible with the patient's known inflammation/ wound. Surgical changes of the posterior left distal thigh and calf. Review of the MIP images confirms the above findings. IMPRESSION: Vascular: Minimal atherosclerotic changes of the visualized aorta, iliac system, and bilateral femoral popliteal system. Major finding of the left lower  extremity is a popliteal artery occlusion at the level of the knee joint in the region of surgical changes, with reconstitution of the tibial artery and peroneal artery via collateral flow. No significant appreciable flow within the posterior tibial artery. Extensive venous collaterals of the abdominal and chest wall, which are more pronounced on the right side. The iliac veins and IVC are not well evaluated on the current study given the timing of the contrast bolus. If there were concern for ilio-caval occlusion, correlation with duplex study may be useful, or alternatively cross-sectional imaging with the venous phase. Reactive lymph nodes of the left greater than right inguinal region and left popliteal region. Associated CT imaging findings of the patient's known left lower extremity chronic soft tissue wound. Cardiomegaly. Nonvascular: Cystic lesion measuring 17 mm in the head of the pancreas associated with the extrahepatic biliary ducts. Most likely cause would be a ductal dilatation in a patient of this age or a pseudocyst. Alternatively, cystic neoplasm could have this appearance, and if there were clinical concern for further evaluation, MRI could be considered as a more specific/ sensitive test of choice. Signed, Yvone Neu. Loreta Ave, DO [facility] Electronically Signed   By: Gilmer Mor D.O.   On: 04/25/2015 17:40   Ct Hip Left Wo Contrast  05/10/2015  CLINICAL DATA:  Lost her balance and fell this evening. EXAM: CT OF THE LEFT HIP WITHOUT CONTRAST TECHNIQUE: Multidetector CT imaging of the left hip  was performed according to the standard protocol. Multiplanar CT image reconstructions were also generated. COMPARISON:  None. FINDINGS: There is generalized osteopenia. There is no acute fracture or dislocation. There is mild osteoarthritis of the left hip. There is no aggressive lytic or sclerotic osseous lesion. There is enthesopathic changes at  the iliopsoas insertion. Grade 1 anterolisthesis of L5 on S1. Bilateral severe facet arthropathy L5-S1. Left L5 pars defect. There is mild left inguinal lymphadenopathy. There is generalized fat stranding involving the left hip and upper thigh. There is skin thickening along the medial aspect of the upper thigh. There is no soft tissue emphysema. Mild fat stranding in the left inguinal region which may be secondary to recent catheterization. IMPRESSION: 1. No acute osseous injury of the left hip. 2. Generalized soft tissue edema in the subcutaneous fat around the pelvis and upper thigh. 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 She has a vague history of what sounds like embolism to the popliteal artery and surgical thrombectomy in [place] many years prior. Prior inpatient noninvasive study demonstrates abnormal waveforms distally, with nonpalpable pulses of the foot. CT angiogram and runoff confirms a popliteal occlusion as well as tibial occlusions. She presents today for angiogram and attempt at revascularization for wound healing capability. EXAM: ULTRASOUND GUIDED ACCESS LEFT SUPERFICIAL FEMORAL ARTERY ULTRASOUND GUIDED ACCESS LEFT DORSALIS PEDIS REVASCULARIZATION OF POPLITEAL OCCLUSION WITH BALLOON ANGIOPLASTY OF POPLITEAL ARTERY REVASCULARIZATION OF LEFT ANTERIOR TIBIAL ARTERY WITH BALLOON ANGIOPLASTY REVASCULARIZATION OF LEFT TIBIOPERONEAL TRUNK AND PERONEAL ARTERY WITH BALLOON ANGIOPLASTY FLUOROSCOPY TIME:  25 minutes 54 seconds MEDICATIONS AND MEDICAL HISTORY: 2 mg intra-arterial tPA, 800 mcg nitroglycerin, 2.5 mg verapamil intra arterial ANESTHESIA/SEDATION: 2.5 mg Versed, 125 mcg fentanyl 180 minutes sedation time CONTRAST:  VISIPAQUE IODIXANOL 320 MG/ML IV SOLN COMPLICATIONS: Small puncture site hematoma at the left  thigh.  SIR category 1. PROCEDURE: The procedure, risks, benefits, and alternatives were explained to the patient and the patient's family. Specific risks that were addressed included bleeding, infection, contrast reaction, kidney injury, need for further procedure, limb loss, cardiopulmonary collapse, death. Questions regarding the procedure were encouraged and answered. The patient understands and consents to the procedure. Proximal left thigh was prepped and draped in the usual sterile fashion. The left ankle was prepped and draped in the usual sterile fashion. Ultrasound survey of the left proximal thigh was performed with images stored and sent to PACs. A micropuncture needle was used access the left superficial femoral artery under ultrasound in antegrade fashion. With excellent arterial blood flow returned, and an .018 micro wire was passed through the needle, observed to enter the SFA under fluoroscopy. The needle was removed, and a micropuncture sheath was placed over the wire. The inner dilator and wire were removed, and an 035 Bentson wire was advanced under fluoroscopy. The sheath was removed and a standard 5 [place] vascular sheath was placed. The dilator was removed and the sheath was flushed. Angiogram was performed of the left lower extremity. Ultrasound survey of the left dorsalis pedis was performed with images stored and sent to PACs. A micropuncture needle was used to access the left dorsalis pedis artery under ultrasound. With arterial blood flow returned, and an .018 micro wire was passed through the needle, observed to enter the distal anterior tibial artery under fluoroscopy.  The needle was removed, and a micropuncture sheath was placed over the wire. The inner dilator and wire were removed. A pharmacologic cocktail was infused. Stiff Glidewire was then passed through the sheath in a retrograde fashion to the anterior tibial artery, traversing the occlusion at the proximal anterior tibial  artery and at the popliteal artery. The wire stayed luminal in the superficial femoral artery. The SFA sheath was then upsized to 6 [place]. A snare was used to snare the Glidewire from above. 035 quick cross catheter was then passed over the Glidewire to the distal anterior tibial artery. Down size to 018 system. Balloon angioplasty was then performed along the length of the anterior tibial artery with 2.0 mm diameter balloon, 2.5 mm diameter balloon, 3.0 mm diameter balloon. 2 mm balloon was used for hemostasis at the puncture site. Antegrade approach to the stump of the tibioperoneal trunk was used with a combination of Glidewire and quick cross catheter. Balloon angioplasty was performed with 2.5 mm diameter balloon and 3.0 mm diameter balloon at the origin, with restoration of flow. Spasm was induced in the mid and distal peroneal artery. Pedal pulse was confirmed at the conclusion of the case, which was not present at the initiation. All catheters and wires were removed. Sheath was removed from the superficial femoral artery, with manual pressure used for hemostasis. Patient tolerated the procedure well and remained hemodynamically stable throughout. No complications were encountered and no significant blood loss was encountered. FINDINGS: No palpable pulse at the initiation of the case. Doppler signals were present. Initial angiogram demonstrates no significant disease of the superficial femoral artery. Occlusion of the popliteal artery at the joint space of the knee. Occlusion extends through the popliteal artery, tibioperoneal trunk, and proximal anterior tibial artery. Reconstitution of the distal anterior tibial artery and of the peroneal artery. No posterior tibial filling was present. After revascularization, there is in-line flow through the popliteal artery, anterior tibial artery, and peroneal artery to the ankle. Spasm induced in the mid and distal peroneal artery, with palpable pulse at the  completion of the angiogram/treatment. IMPRESSION: Status post left lower extremity angiogram with antegrade and retrograde approach to popliteal occlusion and revascularization with plain balloon angioplasty restoring in-line flow through the popliteal artery, peroneal artery and anterior tibial artery to the ankle. Signed, Yvone Neu. Loreta Ave DO [facility] PLAN: p.o. Plavix 300 mg now. Left leg straight for 5 hours. Initiate 75 mg p.o. Plavix daily. Electronically Signed   By: Gilmer Mor D.O.   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 She has a vague history of what sounds like embolism to the popliteal artery and surgical thrombectomy in [place] many years prior. Prior inpatient noninvasive study demonstrates abnormal waveforms distally, with nonpalpable pulses of the foot. CT angiogram and runoff confirms a popliteal occlusion as well as tibial occlusions. She presents today for angiogram and attempt at revascularization for wound healing capability. EXAM: ULTRASOUND GUIDED ACCESS LEFT SUPERFICIAL FEMORAL ARTERY ULTRASOUND GUIDED ACCESS LEFT DORSALIS PEDIS REVASCULARIZATION OF POPLITEAL OCCLUSION WITH BALLOON ANGIOPLASTY OF POPLITEAL ARTERY REVASCULARIZATION OF LEFT ANTERIOR TIBIAL ARTERY WITH BALLOON ANGIOPLASTY REVASCULARIZATION OF LEFT TIBIOPERONEAL TRUNK AND PERONEAL ARTERY WITH BALLOON ANGIOPLASTY FLUOROSCOPY TIME:  25 minutes 54 seconds MEDICATIONS AND MEDICAL HISTORY: 2 mg intra-arterial tPA, 800 mcg nitroglycerin, 2.5 mg verapamil intra arterial ANESTHESIA/SEDATION: 2.5 mg Versed,  125 mcg fentanyl 180 minutes sedation time CONTRAST:  VISIPAQUE IODIXANOL 320 MG/ML IV SOLN COMPLICATIONS: Small puncture site hematoma at the left thigh.  SIR category 1. PROCEDURE: The procedure, risks, benefits, and alternatives  were explained to the patient and the patient's family. Specific risks that were addressed included bleeding, infection, contrast reaction, kidney injury, need for further procedure, limb loss, cardiopulmonary collapse, death. Questions regarding the procedure were encouraged and answered. The patient understands and consents to the procedure. Proximal left thigh was prepped and draped in the usual sterile fashion. The left ankle was prepped and draped in the usual sterile fashion. Ultrasound survey of the left proximal thigh was performed with images stored and sent to PACs. A micropuncture needle was used access the left superficial femoral artery under ultrasound in antegrade fashion. With excellent arterial blood flow returned, and an .018 micro wire was passed through the needle, observed to enter the SFA under fluoroscopy. The needle was removed, and a micropuncture sheath was placed over the wire. The inner dilator and wire were removed, and an 035 Bentson wire was advanced under fluoroscopy. The sheath was removed and a standard 5 [place] vascular sheath was placed. The dilator was removed and the sheath was flushed. Angiogram was performed of the left lower extremity. Ultrasound survey of the left dorsalis pedis was performed with images stored and sent to PACs. A micropuncture needle was used to access the left dorsalis pedis artery under ultrasound. With arterial blood flow returned, and an .018 micro wire was passed through the needle, observed to enter the distal anterior tibial artery under fluoroscopy. The needle was removed, and a micropuncture sheath was placed over the wire. The inner dilator and wire were removed. A pharmacologic cocktail was infused. Stiff Glidewire was then passed through the sheath in a retrograde fashion to the anterior tibial artery, traversing the occlusion at the proximal anterior tibial artery and at the popliteal artery. The wire stayed luminal in the superficial femoral  artery. The SFA sheath was then upsized to 6 [place]. A snare was used to snare the Glidewire from above. 035 quick cross catheter was then passed over the Glidewire to the distal anterior tibial artery. Down size to 018 system. Balloon angioplasty was then performed along the length of the anterior tibial artery with 2.0 mm diameter balloon, 2.5 mm diameter balloon, 3.0 mm diameter balloon. 2 mm balloon was used for hemostasis at the puncture site. Antegrade approach to the stump of the tibioperoneal trunk was used with a combination of Glidewire and quick cross catheter. Balloon angioplasty was performed with 2.5 mm diameter balloon and 3.0 mm diameter balloon at the origin, with restoration of flow. Spasm was induced in the mid and distal peroneal artery. Pedal pulse was confirmed at the conclusion of the case, which was not present at the initiation. All catheters and wires were removed. Sheath was removed from the superficial femoral artery, with manual pressure used for hemostasis. Patient tolerated the procedure well and remained hemodynamically stable throughout. No complications were encountered and no significant blood loss was encountered. FINDINGS: No palpable pulse at the initiation of the case. Doppler signals were present. Initial angiogram demonstrates no significant disease of the superficial femoral artery. Occlusion of the popliteal artery at the joint space of the knee. Occlusion extends through the popliteal artery, tibioperoneal trunk, and proximal anterior tibial artery. Reconstitution of the distal anterior tibial artery and of the peroneal artery. No posterior tibial filling was present. After revascularization, there is  in-line flow through the popliteal artery, anterior tibial artery, and peroneal artery to the ankle. Spasm induced in the mid and distal peroneal artery, with palpable pulse at the completion of the angiogram/treatment. IMPRESSION: Status post left lower extremity angiogram  with antegrade and retrograde approach to popliteal occlusion and revascularization with plain balloon angioplasty restoring in-line flow through the popliteal artery, peroneal artery and anterior tibial artery to the ankle. Signed, Yvone Neu. Loreta Ave DO [facility] Radiology PLAN: p.o. Plavix 300 mg now. Left leg straight for 5 hours. Initiate 75 mg p.o. Plavix daily. Electronically Signed   By: Gilmer Mor D.O.   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 She has a vague history of what sounds like embolism to the popliteal artery and surgical thrombectomy in [place] many years prior. Prior inpatient noninvasive study demonstrates abnormal waveforms distally, with nonpalpable pulses of the foot. CT angiogram and runoff confirms a popliteal occlusion as well as tibial occlusions. She presents today for angiogram and attempt at revascularization for wound healing capability. EXAM: ULTRASOUND GUIDED ACCESS LEFT SUPERFICIAL FEMORAL ARTERY ULTRASOUND GUIDED ACCESS LEFT DORSALIS PEDIS REVASCULARIZATION OF POPLITEAL OCCLUSION WITH BALLOON ANGIOPLASTY OF POPLITEAL ARTERY REVASCULARIZATION OF LEFT ANTERIOR TIBIAL ARTERY WITH BALLOON ANGIOPLASTY REVASCULARIZATION OF LEFT TIBIOPERONEAL TRUNK AND PERONEAL ARTERY WITH BALLOON ANGIOPLASTY FLUOROSCOPY TIME:  25 minutes 54 seconds MEDICATIONS AND MEDICAL HISTORY: 2 mg intra-arterial tPA, 800 mcg nitroglycerin, 2.5 mg verapamil intra arterial ANESTHESIA/SEDATION: 2.5 mg Versed, 125 mcg fentanyl 180 minutes sedation time CONTRAST:  VISIPAQUE IODIXANOL 320 MG/ML IV SOLN COMPLICATIONS: Small puncture site hematoma at the left thigh.  SIR category 1. PROCEDURE: The procedure, risks, benefits, and alternatives were explained to the patient and the patient's family. Specific risks that  were addressed included bleeding, infection, contrast reaction, kidney injury, need for further procedure, limb loss, cardiopulmonary collapse, death. Questions regarding the procedure were encouraged and answered. The patient understands and consents to the procedure. Proximal left thigh was prepped and draped in the usual sterile fashion. The left ankle was prepped and draped in the usual sterile fashion. Ultrasound survey of the left proximal thigh was performed with images stored and sent to PACs. A micropuncture needle was used access the left superficial femoral artery under ultrasound in antegrade fashion. With excellent arterial blood flow returned, and an .018 micro wire was passed through the needle, observed to enter the SFA under fluoroscopy. The needle was removed, and a micropuncture sheath was placed over the wire. The inner dilator and wire were removed, and an 035 Bentson wire was advanced under fluoroscopy. The sheath was removed and a standard 5 [place] vascular sheath was placed. The dilator was removed and the sheath was flushed. Angiogram was performed of the left lower extremity. Ultrasound survey of the left dorsalis pedis was performed with images stored and sent to PACs. A micropuncture needle was used to access the left dorsalis pedis artery under ultrasound. With arterial blood flow returned, and an .018 micro wire was passed through the needle, observed to enter the distal anterior tibial artery under fluoroscopy. The needle was removed, and a micropuncture sheath was placed over the wire. The inner dilator and wire were removed. A pharmacologic cocktail was infused. Stiff Glidewire was then passed through the sheath in a retrograde fashion to the anterior tibial artery, traversing the  occlusion at the proximal anterior tibial artery and at the popliteal artery. The wire stayed luminal in the superficial femoral artery. The SFA sheath was then upsized to 6 [place]. A snare was used to  snare the Glidewire from above. 035 quick cross catheter was then passed over the Glidewire to the distal anterior tibial artery. Down size to 018 system. Balloon angioplasty was then performed along the length of the anterior tibial artery with 2.0 mm diameter balloon, 2.5 mm diameter balloon, 3.0 mm diameter balloon. 2 mm balloon was used for hemostasis at the puncture site. Antegrade approach to the stump of the tibioperoneal trunk was used with a combination of Glidewire and quick cross catheter. Balloon angioplasty was performed with 2.5 mm diameter balloon and 3.0 mm diameter balloon at the origin, with restoration of flow. Spasm was induced in the mid and distal peroneal artery. Pedal pulse was confirmed at the conclusion of the case, which was not present at the initiation. All catheters and wires were removed. Sheath was removed from the superficial femoral artery, with manual pressure used for hemostasis. Patient tolerated the procedure well and remained hemodynamically stable throughout. No complications were encountered and no significant blood loss was encountered. FINDINGS: No palpable pulse at the initiation of the case. Doppler signals were present. Initial angiogram demonstrates no significant disease of the superficial femoral artery. Occlusion of the popliteal artery at the joint space of the knee. Occlusion extends through the popliteal artery, tibioperoneal trunk, and proximal anterior tibial artery. Reconstitution of the distal anterior tibial artery and of the peroneal artery. No posterior tibial filling was present. After revascularization, there is in-line flow through the popliteal artery, anterior tibial artery, and peroneal artery to the ankle. Spasm induced in the mid and distal peroneal artery, with palpable pulse at the completion of the angiogram/treatment. IMPRESSION: Status post left lower extremity angiogram with antegrade and retrograde approach to popliteal occlusion and  revascularization with plain balloon angioplasty restoring in-line flow through the popliteal artery, peroneal artery and anterior tibial artery to the ankle. Signed, Yvone Neu. Loreta Ave DO [facility] Radiology PLAN: p.o. Plavix 300 mg now. Left leg straight for 5 hours. Initiate 75 mg p.o. Plavix daily. Electronically Signed   By: Gilmer Mor D.O.   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 She has a vague history of what sounds like embolism to the popliteal artery and surgical thrombectomy in [place] many years prior. Prior inpatient noninvasive study demonstrates abnormal waveforms distally, with nonpalpable pulses of the foot. CT angiogram and runoff confirms a popliteal occlusion as well as tibial occlusions. She presents today for angiogram and attempt at revascularization for wound healing capability. EXAM: ULTRASOUND GUIDED ACCESS LEFT SUPERFICIAL FEMORAL ARTERY ULTRASOUND GUIDED ACCESS LEFT DORSALIS PEDIS REVASCULARIZATION OF POPLITEAL OCCLUSION WITH BALLOON ANGIOPLASTY OF POPLITEAL ARTERY REVASCULARIZATION OF LEFT ANTERIOR TIBIAL ARTERY WITH BALLOON ANGIOPLASTY REVASCULARIZATION OF LEFT TIBIOPERONEAL TRUNK AND PERONEAL ARTERY WITH BALLOON ANGIOPLASTY FLUOROSCOPY TIME:  25 minutes 54 seconds MEDICATIONS AND MEDICAL HISTORY: 2 mg intra-arterial tPA, 800 mcg nitroglycerin, 2.5 mg verapamil intra arterial ANESTHESIA/SEDATION: 2.5 mg Versed, 125 mcg fentanyl 180 minutes sedation time CONTRAST:  VISIPAQUE IODIXANOL 320 MG/ML IV SOLN COMPLICATIONS: Small puncture site hematoma at the left thigh.  SIR category 1. PROCEDURE: The procedure, risks, benefits, and alternatives were explained to the patient and the patient's  family. Specific risks that were addressed included bleeding, infection, contrast reaction, kidney injury,  need for further procedure, limb loss, cardiopulmonary collapse, death. Questions regarding the procedure were encouraged and answered. The patient understands and consents to the procedure. Proximal left thigh was prepped and draped in the usual sterile fashion. The left ankle was prepped and draped in the usual sterile fashion. Ultrasound survey of the left proximal thigh was performed with images stored and sent to PACs. A micropuncture needle was used access the left superficial femoral artery under ultrasound in antegrade fashion. With excellent arterial blood flow returned, and an .018 micro wire was passed through the needle, observed to enter the SFA under fluoroscopy. The needle was removed, and a micropuncture sheath was placed over the wire. The inner dilator and wire were removed, and an 035 Bentson wire was advanced under fluoroscopy. The sheath was removed and a standard 5 [place] vascular sheath was placed. The dilator was removed and the sheath was flushed. Angiogram was performed of the left lower extremity. Ultrasound survey of the left dorsalis pedis was performed with images stored and sent to PACs. A micropuncture needle was used to access the left dorsalis pedis artery under ultrasound. With arterial blood flow returned, and an .018 micro wire was passed through the needle, observed to enter the distal anterior tibial artery under fluoroscopy. The needle was removed, and a micropuncture sheath was placed over the wire. The inner dilator and wire were removed. A pharmacologic cocktail was infused. Stiff Glidewire was then passed through the sheath in a retrograde fashion to the anterior tibial artery, traversing the occlusion at the proximal anterior tibial artery and at the popliteal artery. The wire stayed luminal in the superficial femoral artery. The SFA sheath was then upsized to 6 [place]. A snare was used to snare the Glidewire from above. 035 quick cross catheter was then passed over the  Glidewire to the distal anterior tibial artery. Down size to 018 system. Balloon angioplasty was then performed along the length of the anterior tibial artery with 2.0 mm diameter balloon, 2.5 mm diameter balloon, 3.0 mm diameter balloon. 2 mm balloon was used for hemostasis at the puncture site. Antegrade approach to the stump of the tibioperoneal trunk was used with a combination of Glidewire and quick cross catheter. Balloon angioplasty was performed with 2.5 mm diameter balloon and 3.0 mm diameter balloon at the origin, with restoration of flow. Spasm was induced in the mid and distal peroneal artery. Pedal pulse was confirmed at the conclusion of the case, which was not present at the initiation. All catheters and wires were removed. Sheath was removed from the superficial femoral artery, with manual pressure used for hemostasis. Patient tolerated the procedure well and remained hemodynamically stable throughout. No complications were encountered and no significant blood loss was encountered. FINDINGS: No palpable pulse at the initiation of the case. Doppler signals were present. Initial angiogram demonstrates no significant disease of the superficial femoral artery. Occlusion of the popliteal artery at the joint space of the knee. Occlusion extends through the popliteal artery, tibioperoneal trunk, and proximal anterior tibial artery. Reconstitution of the distal anterior tibial artery and of the peroneal artery. No posterior tibial filling was present. After revascularization, there is in-line flow through the popliteal artery, anterior tibial artery, and peroneal artery to the ankle. Spasm induced in the mid and distal peroneal artery, with palpable pulse at the completion of the angiogram/treatment. IMPRESSION: Status post left lower extremity angiogram with antegrade and retrograde  approach to popliteal occlusion and revascularization with plain balloon angioplasty restoring in-line flow through the  popliteal artery, peroneal artery and anterior tibial artery to the ankle. Signed, Yvone Neu. Loreta Ave DO [facility] Radiology PLAN: p.o. Plavix 300 mg now. Left leg straight for 5 hours. Initiate 75 mg p.o. Plavix daily. Electronically Signed   By: Gilmer Mor D.O.   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 She has a vague history of what sounds like embolism to the popliteal artery and surgical thrombectomy in [place] many years prior. Prior inpatient noninvasive study demonstrates abnormal waveforms distally, with nonpalpable pulses of the foot. CT angiogram and runoff confirms a popliteal occlusion as well as tibial occlusions. She presents today for angiogram and attempt at revascularization for wound healing capability. EXAM: ULTRASOUND GUIDED ACCESS LEFT SUPERFICIAL FEMORAL ARTERY ULTRASOUND GUIDED ACCESS LEFT DORSALIS PEDIS REVASCULARIZATION OF POPLITEAL OCCLUSION WITH BALLOON ANGIOPLASTY OF POPLITEAL ARTERY REVASCULARIZATION OF LEFT ANTERIOR TIBIAL ARTERY WITH BALLOON ANGIOPLASTY REVASCULARIZATION OF LEFT TIBIOPERONEAL TRUNK AND PERONEAL ARTERY WITH BALLOON ANGIOPLASTY FLUOROSCOPY TIME:  25 minutes 54 seconds MEDICATIONS AND MEDICAL HISTORY: 2 mg intra-arterial tPA, 800 mcg nitroglycerin, 2.5 mg verapamil intra arterial ANESTHESIA/SEDATION: 2.5 mg Versed, 125 mcg fentanyl 180 minutes sedation time CONTRAST:  VISIPAQUE IODIXANOL 320 MG/ML IV SOLN COMPLICATIONS: Small puncture site hematoma at the left thigh.  SIR category 1. PROCEDURE: The procedure, risks, benefits, and alternatives were explained to the patient and the patient's family. Specific risks that were addressed included bleeding, infection, contrast reaction, kidney injury, need for further procedure, limb loss, cardiopulmonary collapse, death. Questions  regarding the procedure were encouraged and answered. The patient understands and consents to the procedure. Proximal left thigh was prepped and draped in the usual sterile fashion. The left ankle was prepped and draped in the usual sterile fashion. Ultrasound survey of the left proximal thigh was performed with images stored and sent to PACs. A micropuncture needle was used access the left superficial femoral artery under ultrasound in antegrade fashion. With excellent arterial blood flow returned, and an .018 micro wire was passed through the needle, observed to enter the SFA under fluoroscopy. The needle was removed, and a micropuncture sheath was placed over the wire. The inner dilator and wire were removed, and an 035 Bentson wire was advanced under fluoroscopy. The sheath was removed and a standard 5 [place] vascular sheath was placed. The dilator was removed and the sheath was flushed. Angiogram was performed of the left lower extremity. Ultrasound survey of the left dorsalis pedis was performed with images stored and sent to PACs. A micropuncture needle was used to access the left dorsalis pedis artery under ultrasound. With arterial blood flow returned, and an .018 micro wire was passed through the needle, observed to enter the distal anterior tibial artery under fluoroscopy. The needle was removed, and a micropuncture sheath was placed over the wire. The inner dilator and wire were removed. A pharmacologic cocktail was infused. Stiff Glidewire was then passed through the sheath in a retrograde fashion to the anterior tibial artery, traversing the occlusion at the proximal anterior tibial artery and at the popliteal artery. The wire stayed luminal in the superficial femoral artery. The SFA sheath was then upsized to 6 [place]. A snare was used to snare the Glidewire from above. 035 quick cross catheter was then passed over  the Glidewire to the distal anterior tibial artery. Down size to 018 system. Balloon  angioplasty was then performed along the length of the anterior tibial artery with 2.0 mm diameter balloon, 2.5 mm diameter balloon, 3.0 mm diameter balloon. 2 mm balloon was used for hemostasis at the puncture site. Antegrade approach to the stump of the tibioperoneal trunk was used with a combination of Glidewire and quick cross catheter. Balloon angioplasty was performed with 2.5 mm diameter balloon and 3.0 mm diameter balloon at the origin, with restoration of flow. Spasm was induced in the mid and distal peroneal artery. Pedal pulse was confirmed at the conclusion of the case, which was not present at the initiation. All catheters and wires were removed. Sheath was removed from the superficial femoral artery, with manual pressure used for hemostasis. Patient tolerated the procedure well and remained hemodynamically stable throughout. No complications were encountered and no significant blood loss was encountered. FINDINGS: No palpable pulse at the initiation of the case. Doppler signals were present. Initial angiogram demonstrates no significant disease of the superficial femoral artery. Occlusion of the popliteal artery at the joint space of the knee. Occlusion extends through the popliteal artery, tibioperoneal trunk, and proximal anterior tibial artery. Reconstitution of the distal anterior tibial artery and of the peroneal artery. No posterior tibial filling was present. After revascularization, there is in-line flow through the popliteal artery, anterior tibial artery, and peroneal artery to the ankle. Spasm induced in the mid and distal peroneal artery, with palpable pulse at the completion of the angiogram/treatment. IMPRESSION: Status post left lower extremity angiogram with antegrade and retrograde approach to popliteal occlusion and revascularization with plain balloon angioplasty restoring in-line flow through the popliteal artery, peroneal artery and anterior tibial artery to the ankle. Signed,  Yvone Neu. Loreta Ave DO [facility] Radiology PLAN: p.o. Plavix 300 mg now. Left leg straight for 5 hours. Initiate 75 mg p.o. Plavix daily. Electronically Signed   By: Gilmer Mor D.O.   On: 05/09/2015 16:00    Microbiology: Recent Results (from the past 240 hour(s))  Culture, blood (Routine X 2) w Reflex to ID Panel     Status: None (Preliminary result)   Collection Time: 05/10/15  8:45 AM  Result Value Ref Range Status   Specimen Description BLOOD LEFT ANTECUBITAL  Final   Special Requests BOTTLES DRAWN AEROBIC ONLY 5CCS  Final   Culture NO GROWTH 3 DAYS  Final   Report Status PENDING  Incomplete  Culture, blood (Routine X 2) w Reflex to ID Panel     Status: None (Preliminary result)   Collection Time: 05/10/15  8:48 AM  Result Value Ref Range Status   Specimen Description BLOOD LEFT FOREARM  Final   Special Requests BOTTLES DRAWN AEROBIC AND ANAEROBIC 5CCS  Final   Culture NO GROWTH 3 DAYS  Final   Report Status PENDING  Incomplete  MRSA PCR Screening     Status: None   Collection Time: 05/10/15  2:57 PM  Result Value Ref Range Status   MRSA by PCR NEGATIVE NEGATIVE Final    Comment:        The GeneXpert MRSA Assay (FDA approved for NASAL specimens only), is one component of a comprehensive MRSA colonization surveillance program. It is not intended to diagnose MRSA infection nor to guide or monitor treatment for MRSA infections.      Labs: Basic Metabolic Panel:  Recent Labs Lab 05/09/15 0719 05/10/15 0143 05/11/15 0330 05/12/15 0350 05/13/15 1191  NA 140 140 141 144 142  K 4.2 3.8 3.6 3.8 3.8  CL 101 101 110 114* 114*  CO2 30 23 25  20* 23  GLUCOSE 142* 98 89 87 88  BUN 19 28* 28* 18 15  CREATININE 0.98 1.83* 1.19* 0.95 1.00  CALCIUM 9.1 8.3* 7.9* 8.3* 8.0*   Liver Function Tests:  Recent Labs Lab 05/10/15 0143 05/11/15 0330  AST 74* 112*  ALT 23 50  ALKPHOS 52 46  BILITOT 0.9 2.5*  PROT 5.6* 5.5*    ALBUMIN 2.2* 1.9*   No results for input(s): LIPASE, AMYLASE in the last 168 hours. No results for input(s): AMMONIA in the last 168 hours. CBC:  Recent Labs Lab 05/09/15 0719 05/10/15 0143 05/10/15 2322 05/11/15 0330 05/12/15 0350 05/13/15 0318  WBC 3.7* 17.8*  --  16.6* 11.9* 6.7  NEUTROABS  --  17.1*  --   --   --   --   HGB 10.0* 7.9* 8.7* 8.7* 9.0* 9.0*  HCT 31.5* 24.7* 27.0* 26.6* 27.8* 28.5*  MCV 92.4 91.5  --  88.4 89.7 89.9  PLT 172 175  --  146* 138* 131*   Cardiac Enzymes: No results for input(s): CKTOTAL, CKMB, CKMBINDEX, TROPONINI in the last 168 hours. BNP: BNP (last 3 results) No results for input(s): BNP in the last 8760 hours.  ProBNP (last 3 results) No results for input(s): PROBNP in the last 8760 hours.  CBG:  Recent Labs Lab 05/11/15 0648 05/12/15 0425 05/12/15 0756 05/13/15 0623 05/13/15 0820  GLUCAP 92 79 75 76 82       Signed:  Aloha Bartok  [facility] 05/13/2015, 11:56 AM

## 2015-05-13 NOTE — Progress Notes (Signed)
Pt been discharged to assisted living. Discharge instructions given. IV access discontinued. Telemetry discontinued. Belongings returned. Pt been transported to assisted living by son. Questions answered. Pt stable at time of discharge.

## 2015-05-14 ENCOUNTER — Other Ambulatory Visit: Payer: Self-pay | Admitting: Interventional Radiology

## 2015-05-14 DIAGNOSIS — I739 Peripheral vascular disease, unspecified: Secondary | ICD-10-CM

## 2015-05-15 ENCOUNTER — Telehealth: Payer: Self-pay | Admitting: Internal Medicine

## 2015-05-15 LAB — CULTURE, BLOOD (ROUTINE X 2)
CULTURE: NO GROWTH
Culture: NO GROWTH

## 2015-05-15 NOTE — Telephone Encounter (Addendum)
Kristina Diaz w/[facility] said they need an order for Physical Therapy and Nursing for the patient. She would also like to know if Dr. Kirtland Bouchard is in charge of her coumadin being done and if so they need her coumadin taken within three days of her [facility] 05/13/15 discharge.

## 2015-05-16 ENCOUNTER — Telehealth: Payer: Self-pay | Admitting: General Practice

## 2015-05-16 NOTE — Telephone Encounter (Signed)
Orders to check INR faxed to [facility].

## 2015-05-16 NOTE — Telephone Encounter (Signed)
Spoke to Kristina Diaz, verbal orders given for Physical Therapy and Nursing Care for pt okay per Dr.K. Kristina Diaz verbalized understanding and asked about coumadin orders. Told her our [facility] nurse will be sending orders over. Kristina Diaz verbalized understanding.

## 2015-05-16 NOTE — Telephone Encounter (Signed)
OK Yes, coumadin clinic has been monitoring

## 2015-05-16 NOTE — Telephone Encounter (Signed)
Okay for orders for Physical Therapy and Nursing Care for pt with Kristina Diaz?

## 2015-05-17 ENCOUNTER — Ambulatory Visit (INDEPENDENT_AMBULATORY_CARE_PROVIDER_SITE_OTHER): Payer: Medicare Other | Admitting: General Practice

## 2015-05-17 DIAGNOSIS — Z5181 Encounter for therapeutic drug level monitoring: Secondary | ICD-10-CM

## 2015-05-17 DIAGNOSIS — I4891 Unspecified atrial fibrillation: Secondary | ICD-10-CM

## 2015-05-17 LAB — PROTIME-INR: INR: 1.9 — AB (ref <=1.1)

## 2015-05-17 NOTE — Progress Notes (Signed)
Pre visit review using our clinic review tool, if applicable. No additional management support is needed unless otherwise documented below in the visit note. 

## 2015-05-21 DIAGNOSIS — L97822 Non-pressure chronic ulcer of other part of left lower leg with fat layer exposed: Secondary | ICD-10-CM | POA: Diagnosis not present

## 2015-05-21 DIAGNOSIS — I4891 Unspecified atrial fibrillation: Secondary | ICD-10-CM | POA: Diagnosis not present

## 2015-05-21 DIAGNOSIS — L97422 Non-pressure chronic ulcer of left heel and midfoot with fat layer exposed: Secondary | ICD-10-CM | POA: Diagnosis not present

## 2015-05-21 DIAGNOSIS — Z86718 Personal history of other venous thrombosis and embolism: Secondary | ICD-10-CM | POA: Diagnosis not present

## 2015-05-21 DIAGNOSIS — I739 Peripheral vascular disease, unspecified: Secondary | ICD-10-CM | POA: Diagnosis not present

## 2015-05-21 DIAGNOSIS — I87392 Chronic venous hypertension (idiopathic) with other complications of left lower extremity: Secondary | ICD-10-CM | POA: Diagnosis not present

## 2015-05-21 DIAGNOSIS — L89622 Pressure ulcer of left heel, stage 2: Secondary | ICD-10-CM | POA: Diagnosis present

## 2015-05-21 DIAGNOSIS — Z7901 Long term (current) use of anticoagulants: Secondary | ICD-10-CM | POA: Diagnosis not present

## 2015-05-21 DIAGNOSIS — I87311 Chronic venous hypertension (idiopathic) with ulcer of right lower extremity: Secondary | ICD-10-CM | POA: Diagnosis not present

## 2015-05-21 DIAGNOSIS — I10 Essential (primary) hypertension: Secondary | ICD-10-CM | POA: Diagnosis not present

## 2015-05-21 DIAGNOSIS — L97529 Non-pressure chronic ulcer of other part of left foot with unspecified severity: Secondary | ICD-10-CM | POA: Diagnosis not present

## 2015-05-21 NOTE — Progress Notes (Unsigned)
   Subjective:    Patient ID: Kristina Diaz, female    DOB: [birth year], 82 y.o.   MRN: [id number]  HPI    Review of Systems     Objective:   Physical Exam        Assessment & Plan:

## 2015-05-23 ENCOUNTER — Telehealth: Payer: Self-pay | Admitting: Internal Medicine

## 2015-05-23 NOTE — Telephone Encounter (Signed)
Marcelino Duster call from [facility] and is requesting asking to see her 2 times a week for 6 weeks for strengting,mobility training as well as getting a new wheel chair for her    Phone number [phone]

## 2015-05-23 NOTE — Telephone Encounter (Signed)
Called Marcelino Duster with [facility] left message on personal voicemail, order given for Physical Therapy 2 times a week for 6 weeks for strengting,mobility training and order for  new wheel chair okay per Dr. Kirtland Bouchard. Any questions please call office.

## 2015-05-23 NOTE — Telephone Encounter (Signed)
Please see message and advise 

## 2015-05-23 NOTE — Telephone Encounter (Signed)
ok 

## 2015-05-27 ENCOUNTER — Other Ambulatory Visit: Payer: Self-pay | Admitting: *Deleted

## 2015-05-27 ENCOUNTER — Telehealth: Payer: Self-pay | Admitting: Internal Medicine

## 2015-05-27 MED ORDER — OXYCODONE HCL 5 MG PO TABS: 5.0000 mg | ORAL_TABLET | Freq: Four times a day (QID) | ORAL | Status: DC | PRN

## 2015-05-27 NOTE — Telephone Encounter (Signed)
[facility] Night - Client TELEPHONE ADVICE RECORD [facility] Patient Name: Kristina Diaz Gender: Female DOB: [birth year] Age: 82 Y 5 M 25 D Return Phone Number: Address: City/State/Zip: [place] [facility] - Client Client Site [facility] - Night Physician [place] Contact Type Call Call Type Page Only Caller Name angela LPN [phone] Relationship To Patient Care Giver Is this call to report lab results? No Return Phone Number Please choose phone number Initial Comment caller states that a pt has run out of her pain meds and [facility]; angela fax# [phone] Nurse Assessment Guidelines Guideline Title Affirmed Question Affirmed Notes Nurse Date/Time (Eastern Time) Disp. Time Lamount Cohen Time) Disposition Final User 05/25/2015 9:16:36 AM Send to [facility] Paging Queue Thurnell Garbe 05/25/2015 9:28:50 AM Paged On Call back to Call Center - PC Marolyn Hammock 05/25/2015 9:52:38 AM Paged On Call back to Call Center - PC Marolyn Hammock 05/25/2015 10:04:29 AM Page Completed Yes Marolyn Hammock After Care Instructions Given Call Event Type User Date / Time Description Paging [facility] Phone DateTime Result/Outcome Message Type Notes Evelena Peat [phone] 05/25/2015 9:28:50 AM Paged On Call Back to Call Center Doctor Paged Please call Nash Dimmer w/[facility] at [phone] Evelena Peat [phone] 05/25/2015 9:52:38 AM Paged On Call Back to Call Center Doctor Paged Please call Otho Perl [facility] at [phone] Evelena Peat 05/25/2015 10:03:08 AM Spoke with On Call - General Message Result

## 2015-05-27 NOTE — Telephone Encounter (Signed)
Received request for refill on Oxycodone 5 mg. Rx faxed to [facility]. Rx printed and signed.

## 2015-05-28 ENCOUNTER — Ambulatory Visit (INDEPENDENT_AMBULATORY_CARE_PROVIDER_SITE_OTHER): Payer: Medicare Other | Admitting: Internal Medicine

## 2015-05-28 ENCOUNTER — Ambulatory Visit: Payer: Medicare Other | Admitting: Internal Medicine

## 2015-05-28 ENCOUNTER — Encounter: Payer: Self-pay | Admitting: Internal Medicine

## 2015-05-28 VITALS — BP 110/60 | HR 110 | Temp 98.4°F | Resp 20 | Wt 124.0 lb

## 2015-05-28 DIAGNOSIS — R609 Edema, unspecified: Secondary | ICD-10-CM | POA: Diagnosis not present

## 2015-05-28 DIAGNOSIS — I5032 Chronic diastolic (congestive) heart failure: Secondary | ICD-10-CM | POA: Diagnosis not present

## 2015-05-28 DIAGNOSIS — I739 Peripheral vascular disease, unspecified: Secondary | ICD-10-CM | POA: Diagnosis not present

## 2015-05-28 DIAGNOSIS — I482 Chronic atrial fibrillation, unspecified: Secondary | ICD-10-CM

## 2015-05-28 DIAGNOSIS — D500 Iron deficiency anemia secondary to blood loss (chronic): Secondary | ICD-10-CM | POA: Diagnosis not present

## 2015-05-28 DIAGNOSIS — L97929 Non-pressure chronic ulcer of unspecified part of left lower leg with unspecified severity: Secondary | ICD-10-CM

## 2015-05-28 LAB — CBC WITH DIFFERENTIAL/PLATELET
Basophils Absolute: 0 10*3/uL (ref 0.0–0.1)
Basophils Relative: 0.6 % (ref 0.0–3.0)
EOS PCT: 2 % (ref 0.0–5.0)
Eosinophils Absolute: 0.1 10*3/uL (ref 0.0–0.7)
HEMATOCRIT: 32.5 % — AB (ref 36.0–46.0)
Hemoglobin: 10.3 g/dL — ABNORMAL LOW (ref 12.0–15.0)
LYMPHS ABS: 0.9 10*3/uL (ref 0.7–4.0)
LYMPHS PCT: 12.7 % (ref 12.0–46.0)
MCHC: 31.7 g/dL (ref 30.0–36.0)
MCV: 91.5 fl (ref 78.0–100.0)
MONOS PCT: 15.1 % — AB (ref 3.0–12.0)
Monocytes Absolute: 1.1 10*3/uL — ABNORMAL HIGH (ref 0.1–1.0)
NEUTROS ABS: 5.1 10*3/uL (ref 1.4–7.7)
NEUTROS PCT: 69.6 % (ref 43.0–77.0)
PLATELETS: 229 10*3/uL (ref 150.0–400.0)
RBC: 3.56 Mil/uL — AB (ref 3.87–5.11)
RDW: 19 % — ABNORMAL HIGH (ref 11.5–15.5)
WBC: 7.4 10*3/uL (ref 4.0–10.5)

## 2015-05-28 LAB — BASIC METABOLIC PANEL
BUN: 25 mg/dL — ABNORMAL HIGH (ref 6–23)
CHLORIDE: 105 meq/L (ref 96–112)
CO2: 32 meq/L (ref 19–32)
Calcium: 9 mg/dL (ref 8.4–10.5)
Creatinine, Ser: 1.02 mg/dL (ref 0.40–1.20)
GFR: 54.94 mL/min — ABNORMAL LOW (ref >60.00)
GLUCOSE: 81 mg/dL (ref 70–99)
POTASSIUM: 5.4 meq/L — AB (ref 3.5–5.1)
SODIUM: 145 meq/L (ref 135–145)

## 2015-05-28 MED ORDER — OXYCODONE HCL ER 10 MG PO T12A: 10.0000 mg | EXTENDED_RELEASE_TABLET | Freq: Two times a day (BID) | ORAL | Status: DC

## 2015-05-28 NOTE — Patient Instructions (Signed)
Limit your sodium (Salt) intake  Continue monthly INRs  Cardiology follow-up as scheduled

## 2015-05-28 NOTE — Progress Notes (Signed)
Pre visit review using our clinic review tool, if applicable. No additional management support is needed unless otherwise documented below in the visit note. 

## 2015-05-28 NOTE — Progress Notes (Signed)
Subjective:    Patient ID: Kristina Diaz, female    DOB: 1929-11-21, 82 y.o.   MRN: [id number]  HPI   Admit date: 05/10/2015 Discharge date: 05/13/2015  Recommendations for Outpatient Follow-up:  Patient will be discharged to assisted living facility with home health PT. Patient will need to follow up with primary care provider within one week of discharge, repeat CBC and BMP in one week. Check INR in 3 days. Patient will also need to follow up with Dr. Loreta Ave, radiology, in 2-4 weeks. Patient should continue medications as prescribed. Patient should follow a Heart healthy diet.   Discharge Diagnoses:  Anemia secondary to blood loss/hematoma Hypertension Chronic systolic heart failure Acute kidney injury Peripheral arterial disease Hypothyroidism Fall  82 year old patient who is seen following a recent hospital discharge.  She had a lower extremity arteriogram performed one day prior to admission.  She was readmitted with a fall and secondary anemia due to acute blood loss. She has chronic atrial fibrillation and has been on chronic Coumadin anticoagulation as well as aspirin and Plavix.  Plavix has been discontinued Discharge hemoglobin 9 The patient resides at an assisted living facility. Hospital course complicated by acute renal insufficiency secondary to volume depletion.  Past Medical History  Diagnosis Date  . PAD (peripheral artery disease) (HCC) 2013    left leg. sounds like she had angio-plasty   . Atrial fibrillation (HCC) 2013    s/p [facility] that failed. ON medication  . Hypertension   . Osteoporosis   . DVT (deep venous thrombosis) (HCC) 2013    "LLE" (08/04/2012)  . Hypothyroidism   . History of blood transfusion 2013    "related to thrombectomy" (08/04/2012)  . Anemia     "slightly" (08/04/2012)  . Arthritis     "probably minor; knees, pinky" (08/04/2012)  . Fracture of L4 vertebra (HCC)     "dx'd in 2013; can't treat til legs fixed" (08/04/2012)  . Lower  extremity ulceration (HCC)   . Biventricular heart failure with reduced left ventricular function (HCC)     Social History   Social History  . Marital Status: Married    Spouse Name: N/A  . Number of Children: 2  . Years of Education: 13   Occupational History  . retired    Social History Main Topics  . Smoking status: Never Smoker   . Smokeless tobacco: Never Used  . Alcohol Use: 0.0 oz/week     Comment: 08/04/2012 "used to have 1-2 glasses of wine/wk; nothing for the past 1 yr"  . Drug Use: No  . Sexual Activity: No   Other Topics Concern  . Not on file   Social History Narrative   [employer]. 1 year of college. Married: 1959: 2 sons - '63, '70: 3 grandchildren. Work - Animal nutritionist. Have been living with husband in [place]. ACP/Living will - HCPOA both sons. Recommended going to [email or web address].                    Past Surgical History  Procedure Laterality Date  . Thrombectomy Left 2013    "leg" (08/04/2012)  . G2p2    . Tonsillectomy and adenoidectomy  ~ 1938  . Dilation and curettage of uterus  1960's    Family History  Problem Relation Age of Onset  . Arthritis Mother   . Cancer Mother     breast cancer   . Cancer Brother     lung cancer  . Tuberculosis Father   .  Cancer Sister     stg 4 breast cancer  . Diabetes Neg Hx   . Heart disease Neg Hx   . COPD Neg Hx     Allergies  Allergen Reactions  . Bactrim Ds [Sulfamethoxazole-Trimethoprim] Rash  . Contrast Media [Iodinated Diagnostic Agents] Hives    BLE erythema   . Ciprofloxacin Rash  . Tape Rash    Current Outpatient Prescriptions on File Prior to Visit  Medication Sig Dispense Refill  . aspirin EC 81 MG EC tablet Take 1 tablet (81 mg total) by mouth daily. 30 tablet 0  . cetirizine (ZYRTEC) 10 MG tablet Take 1 tablet (10 mg total) by mouth 2 (two) times daily. X 14 days 28 tablet 0  . collagenase (SANTYL) ointment Apply 1 application topically every other day. To  bilateral lower extremities wounds    . docusate sodium (COLACE) 100 MG capsule Take 100 mg by mouth daily.    Marland Kitchen levothyroxine (SYNTHROID, LEVOTHROID) 25 MCG tablet TAKE 1 AND 1/2 TABS (37.5MG ) BY MOUTH EVERY DAY*CHECK PULSE WEEKLY* 45 tablet 11  . metoprolol succinate (TOPROL-XL) 25 MG 24 hr tablet Take 0.5 tablets (12.5 mg total) by mouth daily. 30 tablet 11  . Multiple Vitamin (MULTIVITAMIN WITH MINERALS) TABS Take 1 tablet by mouth every morning.    . potassium chloride (K-DUR,KLOR-CON) 10 MEQ tablet Take 1 tablet (10 mEq total) by mouth 2 (two) times daily. 30 tablet 1  . silver sulfADIAZINE (SILVADENE) 1 % cream Apply 1 application topically 2 (two) times daily. Apply to wounds 1-2 times daily as directed.    . torsemide (DEMADEX) 20 MG tablet TAKE 1 TAB BY MOUTH EVERY DAY -- DX: EDEMA AND TAKE 1 TAB BY MOUTH AT 5PM ON MON WED FRI (THIS IS.IN ADDITION TO MORNING SCHEDULE DOSE) -... (Patient taking differently: Take 1 tablet by mouth every morning at 8 am in addition to toresemide  three times weekly) 42 tablet 3  . torsemide (DEMADEX) 20 MG tablet Take 20 mg by mouth every Monday, Wednesday, and Friday. At 5 pm in addition to daily torsemide     . warfarin (COUMADIN) 3 MG tablet Take 4.5mg  (one and a half pills) everyday except Tuesday and Friday. Take  (one pill) on Tuesday and Friday. 60 tablet 0   No current facility-administered medications on file prior to visit.    BP 110/60 mmHg  Pulse 110  Temp(Src) 98.4 F (36.9 C) (Oral)  Resp 20  Wt 124 lb (56.246 kg)  SpO2 97%      Review of Systems  Constitutional: Positive for fatigue.  HENT: Negative for congestion, dental problem, hearing loss, rhinorrhea, sinus pressure, sore throat and tinnitus.   Eyes: Negative for pain, discharge and visual disturbance.  Respiratory: Negative for cough and shortness of breath.   Cardiovascular: Positive for leg swelling. Negative for chest pain and palpitations.    Gastrointestinal: Negative for nausea, vomiting, abdominal pain, diarrhea, constipation, blood in stool and abdominal distention.  Genitourinary: Negative for dysuria, urgency, frequency, hematuria, flank pain, vaginal bleeding, vaginal discharge, difficulty urinating, vaginal pain and pelvic pain.  Musculoskeletal: Negative for joint swelling, arthralgias and gait problem.  Skin: Negative for rash.  Neurological: Negative for dizziness, syncope, speech difficulty, weakness, numbness and headaches.  Hematological: Negative for adenopathy.  Psychiatric/Behavioral: Negative for behavioral problems, dysphoric mood and agitation. The patient is not nervous/anxious.        Objective:   Physical Exam  Constitutional: She is oriented to person, place, and time. She  appears well-developed and well-nourished.  Frail but alert and in no distress Blood pressure low normal Pulse 100  HENT:  Head: Normocephalic.  Right Ear: External ear normal.  Left Ear: External ear normal.  Mouth/Throat: Oropharynx is clear and moist.  Eyes: Conjunctivae and EOM are normal. Pupils are equal, round, and reactive to light.  Neck: Normal range of motion. Neck supple. No thyromegaly present.  Cardiovascular: Normal rate, normal heart sounds and intact distal pulses.   Irregular rhythm with a rate of 100  Pulmonary/Chest: Effort normal and breath sounds normal.  Kyphosis  Abdominal: Soft. Bowel sounds are normal. She exhibits no mass. There is no tenderness.  Musculoskeletal: She exhibits edema.  Chronic left lower leg edema Left leg bandaged.  Distal to the knee Prominent soft tissue swelling in the left popliteal area just proximal to the bandage  Lymphadenopathy:    She has no cervical adenopathy.  Neurological: She is alert and oriented to person, place, and time.  Skin: Skin is warm and dry. No rash noted.  Psychiatric: She has a normal mood and affect. Her behavior is normal.          Assessment &  Plan:   History of blood loss anemia.  Will check a CBC Chronic atrial fibrillation.  Continue Coumadin anticoagulation History of renal insufficiency.  We'll follow up on renal indices Chronic combined systolic and diastolic heart failure PAD  We'll review lab We'll switch back to OxyContin 10 mg every 12 hours, which patient felt was more effective and she was able to get on a more regular schedule  Follow-up cardiology Return here in 3 months or as needed

## 2015-05-31 ENCOUNTER — Encounter: Payer: Self-pay | Admitting: Internal Medicine

## 2015-06-04 ENCOUNTER — Encounter (HOSPITAL_BASED_OUTPATIENT_CLINIC_OR_DEPARTMENT_OTHER): Payer: Medicare Other | Attending: Internal Medicine

## 2015-06-04 DIAGNOSIS — I10 Essential (primary) hypertension: Secondary | ICD-10-CM | POA: Insufficient documentation

## 2015-06-04 DIAGNOSIS — Z86718 Personal history of other venous thrombosis and embolism: Secondary | ICD-10-CM | POA: Insufficient documentation

## 2015-06-04 DIAGNOSIS — I87312 Chronic venous hypertension (idiopathic) with ulcer of left lower extremity: Secondary | ICD-10-CM | POA: Diagnosis not present

## 2015-06-04 DIAGNOSIS — L97521 Non-pressure chronic ulcer of other part of left foot limited to breakdown of skin: Secondary | ICD-10-CM | POA: Insufficient documentation

## 2015-06-04 DIAGNOSIS — L97329 Non-pressure chronic ulcer of left ankle with unspecified severity: Secondary | ICD-10-CM | POA: Insufficient documentation

## 2015-06-04 DIAGNOSIS — I70245 Atherosclerosis of native arteries of left leg with ulceration of other part of foot: Secondary | ICD-10-CM | POA: Diagnosis not present

## 2015-06-04 DIAGNOSIS — L89620 Pressure ulcer of left heel, unstageable: Secondary | ICD-10-CM | POA: Diagnosis not present

## 2015-06-04 DIAGNOSIS — L97822 Non-pressure chronic ulcer of other part of left lower leg with fat layer exposed: Secondary | ICD-10-CM | POA: Diagnosis not present

## 2015-06-06 ENCOUNTER — Ambulatory Visit
Admission: RE | Admit: 2015-06-06 | Discharge: 2015-06-06 | Disposition: A | Discharge status: Home / Self Care | Payer: Medicare Other | Source: Ambulatory Visit | Attending: Interventional Radiology | Admitting: Interventional Radiology

## 2015-06-06 DIAGNOSIS — I739 Peripheral vascular disease, unspecified: Secondary | ICD-10-CM

## 2015-06-06 NOTE — Progress Notes (Signed)
Chief Complaint: Chronic wound, left lower extremity.   Referring Physician(s): Dr. Wiliam Ke, [facility]  History of Present Illness: Kristina Diaz is a 82 y.o. female presenting as a scheduled follow-up to vascular and interventional radiology clinic, status post left lower extremity angiogram with revascularization of occluded distal superficial femoral artery/popliteal artery, and restoration of in-line flow through the SFA, popliteal artery, anterior tibial artery, and peroneal artery to the ankle.  Her procedure was performed 05/09/2015. She was discharged to her rehabilitation facility, [facility], where she unfortunately had a fall while she was transferring herself from the bed. She presented to [facility] emergency department on that evening after her fall with soft tissue wound of her upper extremity, and was treated at the hospital, including treatment for anemia.  She presents today with her husband. She reports that other than her fall at the rehabilitation facility, she has been doing fine. She denies any left lower extremity pain, though does state that she feels the weeping and drainage has worsened over time. She continues to receive care at the wound care center once a week, with her last visit 2 days prior on Tuesday. She has no pain waking her at night. She has no wound at the digits.  In-hospital lower extremity arterial examination was performed in April 2014, although no ABI was recorded. I ordered a noninvasive exam on today's visit, which demonstrates ABI of 0.59, with monophasic Doppler of the left posterior tibial artery and the left anterior tibial artery. Given the non-measurable previous ABI, it seems the ABI has now been improved to a moderate range of severity.  In addition to the arterial component, my impression is that she has a significant venous occlusive component to her chronic wound. There are significant collateral venous channels on the lower  abdominal wall on her prior CT (involving both the right and the left), indicating venous outflow obstruction. We have no prior duplex exam to evaluate the left lower extremity, and so I will order a left lower extremity duplex. Other evidence of the venous component is the location and character of the wound, given the ongoing drainage and location at the gaiter zone.      Past Medical History  Diagnosis Date  . PAD (peripheral artery disease) (HCC) 2013    left leg. sounds like she had angio-plasty   . Atrial fibrillation (HCC) 2013    s/p [facility] that failed. ON medication  . Hypertension   . Osteoporosis   . DVT (deep venous thrombosis) (HCC) 2013    "LLE" (08/04/2012)  . Hypothyroidism   . History of blood transfusion 2013    "related to thrombectomy" (08/04/2012)  . Anemia     "slightly" (08/04/2012)  . Arthritis     "probably minor; knees, pinky" (08/04/2012)  . Fracture of L4 vertebra (HCC)     "dx'd in 2013; can't treat til legs fixed" (08/04/2012)  . Lower extremity ulceration (HCC)   . Biventricular heart failure with reduced left ventricular function [facility])     Past Surgical History  Procedure Laterality Date  . Thrombectomy Left 2013    "leg" (08/04/2012)  . G2p2    . Tonsillectomy and adenoidectomy  ~ 1938  . Dilation and curettage of uterus  1960's    Allergies: Bactrim ds; Contrast media; Ciprofloxacin; and Tape  Medications: Prior to Admission medications   Medication Sig Start Date End Date Taking? Authorizing Provider  aspirin EC 81 MG EC tablet Take 1 tablet (81 mg  total) by mouth daily. 05/13/15  Yes Maryann Mikhail, DO  collagenase (SANTYL) ointment Apply 1 application topically every other day. To bilateral lower extremities wounds   Yes Historical Provider, MD  docusate sodium (COLACE) 100 MG capsule Take 100 mg by mouth daily.   Yes Historical Provider, MD  levothyroxine (SYNTHROID, LEVOTHROID) 25 MCG tablet TAKE 1 AND 1/2 TABS (37.5MG ) BY MOUTH EVERY  DAY*CHECK PULSE WEEKLY* 07/14/12  Yes Jacques Navy, MD  metoprolol succinate (TOPROL-XL) 25 MG 24 hr tablet Take 0.5 tablets (12.5 mg total) by mouth daily. 11/01/12  Yes Jacques Navy, MD  Multiple Vitamin (MULTIVITAMIN WITH MINERALS) TABS Take 1 tablet by mouth every morning.   Yes Historical Provider, MD  oxyCODONE (OXYCONTIN) 10 mg 12 hr tablet Take 1 tablet (10 mg total) by mouth every 12 (twelve) hours. 05/28/15  Yes Gordy Savers, MD  potassium chloride (K-DUR,KLOR-CON) 10 MEQ tablet Take 1 tablet (10 mEq total) by mouth 2 (two) times daily. 08/11/12  Yes Hadassah Pais, PA-C  torsemide (DEMADEX) 20 MG tablet TAKE 1 TAB BY MOUTH EVERY DAY -- DX: EDEMA AND TAKE 1 TAB BY MOUTH AT 5PM ON MON WED FRI (THIS IS.IN ADDITION TO MORNING SCHEDULE DOSE) -... 04/24/14  Yes Laurey Morale, MD  torsemide [facility]) 20 MG tablet Take 20 mg by mouth every Monday, Wednesday, and Friday. At 5 pm in addition to daily torsemide 20mg    Yes Historical Provider, MD  warfarin (COUMADIN) 3 MG tablet Take 4.5mg  (one and a half pills) everyday except Tuesday and Friday. Take 3mg  (one pill) on Tuesday and Friday. 05/14/15  Yes Maryann Mikhail, DO  cetirizine (ZYRTEC) 10 MG tablet Take 1 tablet (10 mg total) by mouth 2 (two) times daily. X 14 days Patient not taking: Reported on 06/06/2015 05/01/15   Nelwyn Salisbury, MD  silver sulfADIAZINE (SILVADENE) 1 % cream Apply 1 application topically 2 (two) times daily. Reported on 06/06/2015    Historical Provider, MD     Family History  Problem Relation Age of Onset  . Arthritis Mother   . Cancer Mother     breast cancer   . Cancer Brother     lung cancer  . Tuberculosis Father   . Cancer Sister     stg 4 breast cancer  . Diabetes Neg Hx   . Heart disease Neg Hx   . COPD Neg Hx     Social History   Social History  . Marital Status: Married    Spouse Name: N/A  . Number of Children: 2  . Years of Education: 13   Occupational History  . retired    Social  History Main Topics  . Smoking status: Never Smoker   . Smokeless tobacco: Never Used  . Alcohol Use: 0.0 oz/week     Comment: 08/04/2012 "used to have 1-2 glasses of wine/wk; nothing for the past 1 yr"  . Drug Use: No  . Sexual Activity: No   Other Topics Concern  . Not on file   Social History Narrative   HSG. 1 year of college. Married: 1959: 2 sons - '63, '70: 3 grandchildren. Work - Animal nutritionist. Have been living with husband in [place]. ACP/Living will - HCPOA both sons. Recommended going to [email or web address].                       Review of Systems: A 12 point ROS discussed and pertinent positives are indicated in the HPI  above.  All other systems are negative.  Review of Systems  Vital Signs: BP 123/57 mmHg  Pulse 76  Temp(Src) 98 F (36.7 C) (Oral)  Resp 14  SpO2 100%  Physical Exam   Atraumatic normocephalic. Wearing glasses. Mucous membrane is moist pink. No scleral icterus or scleral injection. She has a significant kyphotic curvature of her spine. Abdomen soft nontender. Exam of the left lower extremity demonstrates ongoing circumferential wound at the distal calf. Wet margins, with chronic drainage. She has wound dressing in place both on the medial and lateral aspect of the gaiter zone.  Erythema. She has no intertriginous wounds at the toes. No wounds of the toe tips or at the plantar aspect of the foot. Typical location and appearance of a venous wound. Chronic skin thickening and ichthyosis with skin flaking and hemosiderin deposition on the right.  Imaging: Ct Abdomen Pelvis Wo Contrast  05/10/2015  CLINICAL DATA:  82 year old female with severe back pain concern for retroperitoneal hemorrhage. EXAM: CT ABDOMEN AND PELVIS WITHOUT CONTRAST TECHNIQUE: Multidetector CT imaging of the abdomen and pelvis was performed following the standard protocol without IV contrast. COMPARISON:  Radiograph dated 05/10/2015 and CT dated 04/25/2015  FINDINGS: Evaluation of this exam is limited in the absence of intravenous contrast. Evaluation is also limited due to streak artifact caused by patient's arms. Mild bibasilar linear atelectatic changes. There is cardiomegaly. There is coronary vascular calcification. No intra-abdominal free air. No free fluid. The liver appears unremarkable. High attenuating material within the gallbladder may represent vicariously excreted contrast from recent study or small gallstones. The pancreas is not well visualized. A 1.6 cm stable appearing hypodensity is again noted in the region of the head of the pancreas similar to the prior study. The spleen appears unremarkable. The adrenal glands are not well visualized. There is stable appearing low positioning and malrotation of the right kidney. There is no hydronephrosis on either side. There is excreted contrast in the right renal pelvis and within the urinary bladder. The uterus is grossly unremarkable. There is apparent thickening of the wall of the stomach which may be related to underdistention. Gastritis is not excluded. Clinical correlation is recommended. Constipation. Dense stool within the rectum may represent a degree of fecal impaction. There is no evidence of bowel obstruction. There is atherosclerotic calcification of the aorta. No portal venous gas identified. There is no lymphadenopathy. No definite intraperitoneal or retroperitoneal hemorrhage noted. There is diffuse subcutaneous soft tissue stranding. Dilated and serpiginous collateral vasculature noted in the subcutaneous soft tissue of the right anterior abdominal wall. There is diffuse stranding of the subcutaneous tissues of the gluteal and perineal region. No fluid collection or hematoma identified. There is osteopenia. Degenerative changes of the spine. There is L2 compression fracture with near complete loss of vertebral body height and anterior wedging similar to prior study. No acute fracture.  IMPRESSION: No definite retroperitoneal or intraperitoneal hematoma identified. No hematoma seen in the visualized portions of the lower extremities. Electronically Signed   By: Elgie Collard M.D.   On: 05/10/2015 06:10   Dg Chest 2 View  05/10/2015  CLINICAL DATA:  82 year old female with hypotension and heart failure. EXAM: CHEST  2 VIEW COMPARISON:  Chest radiograph dated 01/23/2015 FINDINGS: Two views of the chest demonstrate stable cardiomegaly. No focal consolidation, pleural effusion, or pneumothorax. The osseous structures are grossly unremarkable. IMPRESSION: Stable cardiomegaly.  No acute cardiopulmonary process. Electronically Signed   By: Elgie Collard M.D.   On: 05/10/2015 06:19  Dg Lumbar Spine Complete  05/10/2015  CLINICAL DATA:  Fall, diffuse pain. EXAM: LUMBAR SPINE - COMPLETE 4+ VIEW COMPARISON:  Reformats from CT lower extremity angiogram 04/25/2015 FINDINGS: Severe compression deformity of L2 is unchanged from prior exam. There is otherwise no acute fracture. Mild anterolisthesis of L5 on S1 is unchanged from CT. Multilevel facet arthropathy is seen. Significant osteoporosis. Excreted contrast within kidneys and urinary bladder from angiogram yesterday. IMPRESSION: Chronic compression deformity of L3. No definite acute fracture. Severe osteoporosis. Electronically Signed   By: Rubye Oaks M.D.   On: 05/10/2015 03:32   Ct Head Wo Contrast  05/10/2015  CLINICAL DATA:  Lost her balance, fall to ground. Neck pain. Slurred speech. EXAM: CT HEAD WITHOUT CONTRAST CT CERVICAL SPINE WITHOUT CONTRAST TECHNIQUE: Multidetector CT imaging of the head and cervical spine was performed following the standard protocol without intravenous contrast. Multiplanar CT image reconstructions of the cervical spine were also generated. COMPARISON:  None. FINDINGS: CT HEAD FINDINGS No intracranial hemorrhage, mass effect, or midline shift. Generalized atrophy and chronic small vessel ischemia. No  hydrocephalus. Portions of the skullbase are obscured by streak artifact from metallic dentures/dental hardware. The basilar cisterns are patent. No evidence of territorial infarct. No intracranial fluid collection. Calvarium is intact. Right maxillary sinuses moderate of, no paranasal sinus inflammatory change. Mild sclerosis but inferior left mastoid air cells. CT CERVICAL SPINE FINDINGS Exaggerated cervical lordosis. Diffuse osteoporosis. Vertebral body heights are preserved. There is no fracture. The dens is intact. There are no jumped or perched facets. Disc space narrowing at C5-C6. Mild multilevel facet arthropathy. No prevertebral soft tissue edema. The esophagus appears dilated were visualized. IMPRESSION: 1. Atrophy and chronic small vessel ischemia without acute intracranial abnormality. 2. Degenerative change in osteoporosis in the cervical spine. No acute fracture or subluxation. Electronically Signed   By: Rubye Oaks M.D.   On: 05/10/2015 03:50   Ct Cervical Spine Wo Contrast  05/10/2015  CLINICAL DATA:  Lost her balance, fall to ground. Neck pain. Slurred speech. EXAM: CT HEAD WITHOUT CONTRAST CT CERVICAL SPINE WITHOUT CONTRAST TECHNIQUE: Multidetector CT imaging of the head and cervical spine was performed following the standard protocol without intravenous contrast. Multiplanar CT image reconstructions of the cervical spine were also generated. COMPARISON:  None. FINDINGS: CT HEAD FINDINGS No intracranial hemorrhage, mass effect, or midline shift. Generalized atrophy and chronic small vessel ischemia. No hydrocephalus. Portions of the skullbase are obscured by streak artifact from metallic dentures/dental hardware. The basilar cisterns are patent. No evidence of territorial infarct. No intracranial fluid collection. Calvarium is intact. Right maxillary sinuses moderate of, no paranasal sinus inflammatory change. Mild sclerosis but inferior left mastoid air cells. CT CERVICAL SPINE FINDINGS  Exaggerated cervical lordosis. Diffuse osteoporosis. Vertebral body heights are preserved. There is no fracture. The dens is intact. There are no jumped or perched facets. Disc space narrowing at C5-C6. Mild multilevel facet arthropathy. No prevertebral soft tissue edema. The esophagus appears dilated were visualized. IMPRESSION: 1. Atrophy and chronic small vessel ischemia without acute intracranial abnormality. 2. Degenerative change in osteoporosis in the cervical spine. No acute fracture or subluxation. Electronically Signed   By: Rubye Oaks M.D.   On: 05/10/2015 03:50   Ir Angiogram Extremity Left  05/09/2015  CLINICAL DATA:  82 year old female with a history of chronic nonhealing wound of the left lower extremity. She has a vague history of what sounds like embolism to the popliteal artery and surgical thrombectomy in [place] many years prior. Prior inpatient noninvasive study  demonstrates abnormal waveforms distally, with nonpalpable pulses of the foot. CT angiogram and runoff confirms a popliteal occlusion as well as tibial occlusions. She presents today for angiogram and attempt at revascularization for wound healing capability. EXAM: ULTRASOUND GUIDED ACCESS LEFT SUPERFICIAL FEMORAL ARTERY ULTRASOUND GUIDED ACCESS LEFT DORSALIS PEDIS REVASCULARIZATION OF POPLITEAL OCCLUSION WITH BALLOON ANGIOPLASTY OF POPLITEAL ARTERY REVASCULARIZATION OF LEFT ANTERIOR TIBIAL ARTERY WITH BALLOON ANGIOPLASTY REVASCULARIZATION OF LEFT TIBIOPERONEAL TRUNK AND PERONEAL ARTERY WITH BALLOON ANGIOPLASTY FLUOROSCOPY TIME:  25 minutes 54 seconds MEDICATIONS AND MEDICAL HISTORY: 2 mg intra-arterial tPA, 800 mcg nitroglycerin, 2.5 mg verapamil intra arterial ANESTHESIA/SEDATION: 2.5 mg Versed, 125 mcg fentanyl 180 minutes sedation time CONTRAST:  VISIPAQUE IODIXANOL 320 MG/ML IV SOLN COMPLICATIONS: Small puncture site hematoma at the left thigh.  SIR category 1. PROCEDURE: The procedure, risks, benefits, and  alternatives were explained to the patient and the patient's family. Specific risks that were addressed included bleeding, infection, contrast reaction, kidney injury, need for further procedure, limb loss, cardiopulmonary collapse, death. Questions regarding the procedure were encouraged and answered. The patient understands and consents to the procedure. Proximal left thigh was prepped and draped in the usual sterile fashion. The left ankle was prepped and draped in the usual sterile fashion. Ultrasound survey of the left proximal thigh was performed with images stored and sent to PACs. A micropuncture needle was used access the left superficial femoral artery under ultrasound in antegrade fashion. With excellent arterial blood flow returned, and an .018 micro wire was passed through the needle, observed to enter the SFA under fluoroscopy. The needle was removed, and a micropuncture sheath was placed over the wire. The inner dilator and wire were removed, and an 035 Bentson wire was advanced under fluoroscopy. The sheath was removed and a standard 5 [place] vascular sheath was placed. The dilator was removed and the sheath was flushed. Angiogram was performed of the left lower extremity. Ultrasound survey of the left dorsalis pedis was performed with images stored and sent to PACs. A micropuncture needle was used to access the left dorsalis pedis artery under ultrasound. With arterial blood flow returned, and an .018 micro wire was passed through the needle, observed to enter the distal anterior tibial artery under fluoroscopy. The needle was removed, and a micropuncture sheath was placed over the wire. The inner dilator and wire were removed. A pharmacologic cocktail was infused. Stiff Glidewire was then passed through the sheath in a retrograde fashion to the anterior tibial artery, traversing the occlusion at the proximal anterior tibial artery and at the popliteal artery. The wire stayed luminal in the  superficial femoral artery. The SFA sheath was then upsized to 6 [place]. A snare was used to snare the Glidewire from above. 035 quick cross catheter was then passed over the Glidewire to the distal anterior tibial artery. Down size to 018 system. Balloon angioplasty was then performed along the length of the anterior tibial artery with 2.0 mm diameter balloon, 2.5 mm diameter balloon, 3.0 mm diameter balloon. 2 mm balloon was used for hemostasis at the puncture site. Antegrade approach to the stump of the tibioperoneal trunk was used with a combination of Glidewire and quick cross catheter. Balloon angioplasty was performed with 2.5 mm diameter balloon and 3.0 mm diameter balloon at the origin, with restoration of flow. Spasm was induced in the mid and distal peroneal artery. Pedal pulse was confirmed at the conclusion of the case, which was not present at the initiation. All catheters and wires were removed.  Sheath was removed from the superficial femoral artery, with manual pressure used for hemostasis. Patient tolerated the procedure well and remained hemodynamically stable throughout. No complications were encountered and no significant blood loss was encountered. FINDINGS: No palpable pulse at the initiation of the case. Doppler signals were present. Initial angiogram demonstrates no significant disease of the superficial femoral artery. Occlusion of the popliteal artery at the joint space of the knee. Occlusion extends through the popliteal artery, tibioperoneal trunk, and proximal anterior tibial artery. Reconstitution of the distal anterior tibial artery and of the peroneal artery. No posterior tibial filling was present. After revascularization, there is in-line flow through the popliteal artery, anterior tibial artery, and peroneal artery to the ankle. Spasm induced in the mid and distal peroneal artery, with palpable pulse at the completion of the angiogram/treatment. IMPRESSION: Status post left lower  extremity angiogram with antegrade and retrograde approach to popliteal occlusion and revascularization with plain balloon angioplasty restoring in-line flow through the popliteal artery, peroneal artery and anterior tibial artery to the ankle. Signed, Yvone Neu. Loreta Ave DO [facility] Radiology PLAN: p.o. Plavix 300 mg now. Left leg straight for 5 hours. Initiate 75 mg p.o. Plavix daily. Electronically Signed   By: Gilmer Mor D.O.   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 She has a vague history of what sounds like embolism to the popliteal artery and surgical thrombectomy in [place] many years prior. Prior inpatient noninvasive study demonstrates abnormal waveforms distally, with nonpalpable pulses of the foot. CT angiogram and runoff confirms a popliteal occlusion as well as tibial occlusions. She presents today for angiogram and attempt at revascularization for wound healing capability. EXAM: ULTRASOUND GUIDED ACCESS LEFT SUPERFICIAL FEMORAL ARTERY ULTRASOUND GUIDED ACCESS LEFT DORSALIS PEDIS REVASCULARIZATION OF POPLITEAL OCCLUSION WITH BALLOON ANGIOPLASTY OF POPLITEAL ARTERY REVASCULARIZATION OF LEFT ANTERIOR TIBIAL ARTERY WITH BALLOON ANGIOPLASTY REVASCULARIZATION OF LEFT TIBIOPERONEAL TRUNK AND PERONEAL ARTERY WITH BALLOON ANGIOPLASTY FLUOROSCOPY TIME:  25 minutes 54 seconds MEDICATIONS AND MEDICAL HISTORY: 2 mg intra-arterial tPA, 800 mcg nitroglycerin, 2.5 mg verapamil intra arterial ANESTHESIA/SEDATION: 2.5 mg Versed, 125 mcg fentanyl 180 minutes sedation time CONTRAST:  VISIPAQUE IODIXANOL 320 MG/ML IV SOLN COMPLICATIONS: Small puncture site hematoma at the left thigh.  SIR category 1. PROCEDURE: The procedure, risks, benefits, and alternatives were explained to the patient and the patient's family. Specific risks  that were addressed included bleeding, infection, contrast reaction, kidney injury, need for further procedure, limb loss, cardiopulmonary collapse, death. Questions regarding the procedure were encouraged and answered. The patient understands and consents to the procedure. Proximal left thigh was prepped and draped in the usual sterile fashion. The left ankle was prepped and draped in the usual sterile fashion. Ultrasound survey of the left proximal thigh was performed with images stored and sent to PACs. A micropuncture needle was used access the left superficial femoral artery under ultrasound in antegrade fashion. With excellent arterial blood flow returned, and an .018 micro wire was passed through the needle, observed to enter the SFA under fluoroscopy. The needle was removed, and a micropuncture sheath was placed over the wire. The inner dilator and wire were removed, and an 035 Bentson wire was advanced under fluoroscopy. The sheath was removed and a standard 5 [place] vascular sheath was placed. The dilator was removed and the sheath was flushed. Angiogram was performed of the  left lower extremity. Ultrasound survey of the left dorsalis pedis was performed with images stored and sent to PACs. A micropuncture needle was used to access the left dorsalis pedis artery under ultrasound. With arterial blood flow returned, and an .018 micro wire was passed through the needle, observed to enter the distal anterior tibial artery under fluoroscopy. The needle was removed, and a micropuncture sheath was placed over the wire. The inner dilator and wire were removed. A pharmacologic cocktail was infused. Stiff Glidewire was then passed through the sheath in a retrograde fashion to the anterior tibial artery, traversing the occlusion at the proximal anterior tibial artery and at the popliteal artery. The wire stayed luminal in the superficial femoral artery. The SFA sheath was then upsized to 6 [place]. A snare was used to  snare the Glidewire from above. 035 quick cross catheter was then passed over the Glidewire to the distal anterior tibial artery. Down size to 018 system. Balloon angioplasty was then performed along the length of the anterior tibial artery with 2.0 mm diameter balloon, 2.5 mm diameter balloon, 3.0 mm diameter balloon. 2 mm balloon was used for hemostasis at the puncture site. Antegrade approach to the stump of the tibioperoneal trunk was used with a combination of Glidewire and quick cross catheter. Balloon angioplasty was performed with 2.5 mm diameter balloon and 3.0 mm diameter balloon at the origin, with restoration of flow. Spasm was induced in the mid and distal peroneal artery. Pedal pulse was confirmed at the conclusion of the case, which was not present at the initiation. All catheters and wires were removed. Sheath was removed from the superficial femoral artery, with manual pressure used for hemostasis. Patient tolerated the procedure well and remained hemodynamically stable throughout. No complications were encountered and no significant blood loss was encountered. FINDINGS: No palpable pulse at the initiation of the case. Doppler signals were present. Initial angiogram demonstrates no significant disease of the superficial femoral artery. Occlusion of the popliteal artery at the joint space of the knee. Occlusion extends through the popliteal artery, tibioperoneal trunk, and proximal anterior tibial artery. Reconstitution of the distal anterior tibial artery and of the peroneal artery. No posterior tibial filling was present. After revascularization, there is in-line flow through the popliteal artery, anterior tibial artery, and peroneal artery to the ankle. Spasm induced in the mid and distal peroneal artery, with palpable pulse at the completion of the angiogram/treatment. IMPRESSION: Status post left lower extremity angiogram with antegrade and retrograde approach to popliteal occlusion and  revascularization with plain balloon angioplasty restoring in-line flow through the popliteal artery, peroneal artery and anterior tibial artery to the ankle. Signed, Yvone Neu. Loreta Ave DO [facility] [place] Radiology PLAN: p.o. Plavix 300 mg now. Left leg straight for 5 hours. Initiate 75 mg p.o. Plavix daily. Electronically Signed   By: Gilmer Mor D.O.   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 She has a vague history of what sounds like embolism to the popliteal artery and surgical thrombectomy in [place] many years prior. Prior inpatient noninvasive study demonstrates abnormal waveforms distally, with nonpalpable pulses of the foot. CT angiogram and runoff confirms a popliteal occlusion as well as tibial occlusions. She presents today for angiogram and attempt at revascularization for wound healing capability. EXAM: ULTRASOUND GUIDED ACCESS LEFT SUPERFICIAL FEMORAL ARTERY ULTRASOUND GUIDED ACCESS LEFT DORSALIS PEDIS  REVASCULARIZATION OF POPLITEAL OCCLUSION WITH BALLOON ANGIOPLASTY OF POPLITEAL ARTERY REVASCULARIZATION OF LEFT ANTERIOR TIBIAL ARTERY WITH BALLOON ANGIOPLASTY REVASCULARIZATION OF LEFT TIBIOPERONEAL TRUNK AND PERONEAL ARTERY WITH BALLOON ANGIOPLASTY FLUOROSCOPY TIME:  25 minutes 54 seconds MEDICATIONS AND MEDICAL HISTORY: 2 mg intra-arterial tPA, 800 mcg nitroglycerin, 2.5 mg verapamil intra arterial ANESTHESIA/SEDATION: 2.5 mg Versed, 125 mcg fentanyl 180 minutes sedation time CONTRAST:  VISIPAQUE IODIXANOL 320 MG/ML IV SOLN COMPLICATIONS: Small puncture site hematoma at the left thigh.  SIR category 1. PROCEDURE: The procedure, risks, benefits, and alternatives were explained to the patient and the patient's family. Specific risks that were addressed included bleeding, infection, contrast reaction, kidney injury,  need for further procedure, limb loss, cardiopulmonary collapse, death. Questions regarding the procedure were encouraged and answered. The patient understands and consents to the procedure. Proximal left thigh was prepped and draped in the usual sterile fashion. The left ankle was prepped and draped in the usual sterile fashion. Ultrasound survey of the left proximal thigh was performed with images stored and sent to PACs. A micropuncture needle was used access the left superficial femoral artery under ultrasound in antegrade fashion. With excellent arterial blood flow returned, and an .018 micro wire was passed through the needle, observed to enter the SFA under fluoroscopy. The needle was removed, and a micropuncture sheath was placed over the wire. The inner dilator and wire were removed, and an 035 Bentson wire was advanced under fluoroscopy. The sheath was removed and a standard 5 [place] vascular sheath was placed. The dilator was removed and the sheath was flushed. Angiogram was performed of the left lower extremity. Ultrasound survey of the left dorsalis pedis was performed with images stored and sent to PACs. A micropuncture needle was used to access the left dorsalis pedis artery under ultrasound. With arterial blood flow returned, and an .018 micro wire was passed through the needle, observed to enter the distal anterior tibial artery under fluoroscopy. The needle was removed, and a micropuncture sheath was placed over the wire. The inner dilator and wire were removed. A pharmacologic cocktail was infused. Stiff Glidewire was then passed through the sheath in a retrograde fashion to the anterior tibial artery, traversing the occlusion at the proximal anterior tibial artery and at the popliteal artery. The wire stayed luminal in the superficial femoral artery. The SFA sheath was then upsized to 6 [place]. A snare was used to snare the Glidewire from above. 035 quick cross catheter was then passed over the  Glidewire to the distal anterior tibial artery. Down size to 018 system. Balloon angioplasty was then performed along the length of the anterior tibial artery with 2.0 mm diameter balloon, 2.5 mm diameter balloon, 3.0 mm diameter balloon. 2 mm balloon was used for hemostasis at the puncture site. Antegrade approach to the stump of the tibioperoneal trunk was used with a combination of Glidewire and quick cross catheter. Balloon angioplasty was performed with 2.5 mm diameter balloon and 3.0 mm diameter balloon at the origin, with restoration of flow. Spasm was induced in the mid and distal peroneal artery. Pedal pulse was confirmed at the conclusion of the case, which was not present at the initiation. All catheters and wires were removed. Sheath was removed from the superficial femoral artery, with manual pressure used for hemostasis. Patient tolerated the procedure well and remained hemodynamically stable throughout. No complications were encountered and no significant blood loss was encountered. FINDINGS: No palpable pulse at the initiation of the case. Doppler signals were present. Initial  angiogram demonstrates no significant disease of the superficial femoral artery. Occlusion of the popliteal artery at the joint space of the knee. Occlusion extends through the popliteal artery, tibioperoneal trunk, and proximal anterior tibial artery. Reconstitution of the distal anterior tibial artery and of the peroneal artery. No posterior tibial filling was present. After revascularization, there is in-line flow through the popliteal artery, anterior tibial artery, and peroneal artery to the ankle. Spasm induced in the mid and distal peroneal artery, with palpable pulse at the completion of the angiogram/treatment. IMPRESSION: Status post left lower extremity angiogram with antegrade and retrograde approach to popliteal occlusion and revascularization with plain balloon angioplasty restoring in-line flow through the  popliteal artery, peroneal artery and anterior tibial artery to the ankle. Signed, Yvone Neu. Loreta Ave DO [facility] Radiology PLAN: p.o. Plavix 300 mg now. Left leg straight for 5 hours. Initiate 75 mg p.o. Plavix daily. Electronically Signed   By: Gilmer Mor D.O.   On: 05/09/2015 16:00   Ct Hip Left Wo Contrast  05/10/2015  CLINICAL DATA:  Lost her balance and fell this evening. EXAM: CT OF THE LEFT HIP WITHOUT CONTRAST TECHNIQUE: Multidetector CT imaging of the left hip was performed according to the standard protocol. Multiplanar CT image reconstructions were also generated. COMPARISON:  None. FINDINGS: There is generalized osteopenia. There is no acute fracture or dislocation. There is mild osteoarthritis of the left hip. There is no aggressive lytic or sclerotic osseous lesion. There is enthesopathic changes at the iliopsoas insertion. Grade 1 anterolisthesis of L5 on S1. Bilateral severe facet arthropathy L5-S1. Left L5 pars defect. There is mild left inguinal lymphadenopathy. There is generalized fat stranding involving the left hip and upper thigh. There is skin thickening along the medial aspect of the upper thigh. There is no soft tissue emphysema. Mild fat stranding in the left inguinal region which may be secondary to recent catheterization. IMPRESSION: 1. No acute osseous injury of the left hip. 2. Generalized soft tissue edema in the subcutaneous fat around the pelvis and upper thigh. Electronically Signed   By: Elige Ko   On: 05/10/2015 10:40   US Arterial Seg Multiple  06/06/2015  CLINICAL DATA:  Peripheral arterial disease. History of chronic nonhealing wound in left lower extremity. EXAM: NONINVASIVE PHYSIOLOGIC VASCULAR STUDY OF BILATERAL LOWER EXTREMITIES TECHNIQUE: Evaluation of both lower extremities was performed at rest, including calculation of ankle-brachial indices, multiple segmental pressure evaluation, segmental Doppler and  segmental pulse volume recording. COMPARISON:  Left lower extremity arteriogram 05/09/2015. ABI examination 01/24/2015. FINDINGS: Right ABI:  1.24 Left ABI:  0.59 Right Lower Extremity: Normal triphasic Doppler waveforms at the right posterior tibial artery and right dorsalis pedis artery. Left Lower Extremity: Monophasic Doppler waveforms in the left posterior tibial artery and dorsalis pedis artery. IMPRESSION: Left ABI is 0.59 and suggest moderate to severe peripheral arterial disease. Normal right ABI, measuring 1.24. Electronically Signed   By: Richarda Overlie M.D.   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 She has a vague history of what sounds like embolism to the popliteal artery and surgical thrombectomy in [place] many years prior. Prior inpatient noninvasive study demonstrates abnormal waveforms distally, with nonpalpable pulses of the foot. CT angiogram and runoff confirms a popliteal occlusion as well as tibial occlusions. She presents today for angiogram and attempt at  revascularization for wound healing capability. EXAM: ULTRASOUND GUIDED ACCESS LEFT SUPERFICIAL FEMORAL ARTERY ULTRASOUND GUIDED ACCESS LEFT DORSALIS PEDIS REVASCULARIZATION OF POPLITEAL OCCLUSION WITH BALLOON ANGIOPLASTY OF POPLITEAL ARTERY REVASCULARIZATION OF LEFT ANTERIOR TIBIAL ARTERY WITH BALLOON ANGIOPLASTY REVASCULARIZATION OF LEFT TIBIOPERONEAL TRUNK AND PERONEAL ARTERY WITH BALLOON ANGIOPLASTY FLUOROSCOPY TIME:  25 minutes 54 seconds MEDICATIONS AND MEDICAL HISTORY: 2 mg intra-arterial tPA, 800 mcg nitroglycerin, 2.5 mg verapamil intra arterial ANESTHESIA/SEDATION: 2.5 mg Versed, 125 mcg fentanyl 180 minutes sedation time CONTRAST:  VISIPAQUE IODIXANOL 320 MG/ML IV SOLN COMPLICATIONS: Small puncture site hematoma at the left thigh.  SIR category 1. PROCEDURE: The procedure, risks,  benefits, and alternatives were explained to the patient and the patient's family. Specific risks that were addressed included bleeding, infection, contrast reaction, kidney injury, need for further procedure, limb loss, cardiopulmonary collapse, death. Questions regarding the procedure were encouraged and answered. The patient understands and consents to the procedure. Proximal left thigh was prepped and draped in the usual sterile fashion. The left ankle was prepped and draped in the usual sterile fashion. Ultrasound survey of the left proximal thigh was performed with images stored and sent to PACs. A micropuncture needle was used access the left superficial femoral artery under ultrasound in antegrade fashion. With excellent arterial blood flow returned, and an .018 micro wire was passed through the needle, observed to enter the SFA under fluoroscopy. The needle was removed, and a micropuncture sheath was placed over the wire. The inner dilator and wire were removed, and an 035 Bentson wire was advanced under fluoroscopy. The sheath was removed and a standard 5 [place] vascular sheath was placed. The dilator was removed and the sheath was flushed. Angiogram was performed of the left lower extremity. Ultrasound survey of the left dorsalis pedis was performed with images stored and sent to PACs. A micropuncture needle was used to access the left dorsalis pedis artery under ultrasound. With arterial blood flow returned, and an .018 micro wire was passed through the needle, observed to enter the distal anterior tibial artery under fluoroscopy. The needle was removed, and a micropuncture sheath was placed over the wire. The inner dilator and wire were removed. A pharmacologic cocktail was infused. Stiff Glidewire was then passed through the sheath in a retrograde fashion to the anterior tibial artery, traversing the occlusion at the proximal anterior tibial artery and at the popliteal artery. The wire stayed luminal  in the superficial femoral artery. The SFA sheath was then upsized to 6 [place]. A snare was used to snare the Glidewire from above. 035 quick cross catheter was then passed over the Glidewire to the distal anterior tibial artery. Down size to 018 system. Balloon angioplasty was then performed along the length of the anterior tibial artery with 2.0 mm diameter balloon, 2.5 mm diameter balloon, 3.0 mm diameter balloon. 2 mm balloon was used for hemostasis at the puncture site. Antegrade approach to the stump of the tibioperoneal trunk was used with a combination of Glidewire and quick cross catheter. Balloon angioplasty was performed with 2.5 mm diameter balloon and 3.0 mm diameter balloon at the origin, with restoration of flow. Spasm was induced in the mid and distal peroneal artery. Pedal pulse was confirmed at the conclusion of the case, which was not present at the initiation. All catheters and wires were removed. Sheath was removed from the superficial femoral artery, with manual pressure used for hemostasis. Patient tolerated the procedure well and remained hemodynamically stable throughout. No complications were encountered and no significant  blood loss was encountered. FINDINGS: No palpable pulse at the initiation of the case. Doppler signals were present. Initial angiogram demonstrates no significant disease of the superficial femoral artery. Occlusion of the popliteal artery at the joint space of the knee. Occlusion extends through the popliteal artery, tibioperoneal trunk, and proximal anterior tibial artery. Reconstitution of the distal anterior tibial artery and of the peroneal artery. No posterior tibial filling was present. After revascularization, there is in-line flow through the popliteal artery, anterior tibial artery, and peroneal artery to the ankle. Spasm induced in the mid and distal peroneal artery, with palpable pulse at the completion of the angiogram/treatment. IMPRESSION: Status post left  lower extremity angiogram with antegrade and retrograde approach to popliteal occlusion and revascularization with plain balloon angioplasty restoring in-line flow through the popliteal artery, peroneal artery and anterior tibial artery to the ankle. Signed, Yvone Neu. Loreta Ave DO [facility] Radiology PLAN: p.o. Plavix 300 mg now. Left leg straight for 5 hours. Initiate 75 mg p.o. Plavix daily. Electronically Signed   By: Gilmer Mor D.O.   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 She has a vague history of what sounds like embolism to the popliteal artery and surgical thrombectomy in [place] many years prior. Prior inpatient noninvasive study demonstrates abnormal waveforms distally, with nonpalpable pulses of the foot. CT angiogram and runoff confirms a popliteal occlusion as well as tibial occlusions. She presents today for angiogram and attempt at revascularization for wound healing capability. EXAM: ULTRASOUND GUIDED ACCESS LEFT SUPERFICIAL FEMORAL ARTERY ULTRASOUND GUIDED ACCESS LEFT DORSALIS PEDIS REVASCULARIZATION OF POPLITEAL OCCLUSION WITH BALLOON ANGIOPLASTY OF POPLITEAL ARTERY REVASCULARIZATION OF LEFT ANTERIOR TIBIAL ARTERY WITH BALLOON ANGIOPLASTY REVASCULARIZATION OF LEFT TIBIOPERONEAL TRUNK AND PERONEAL ARTERY WITH BALLOON ANGIOPLASTY FLUOROSCOPY TIME:  25 minutes 54 seconds MEDICATIONS AND MEDICAL HISTORY: 2 mg intra-arterial tPA, 800 mcg nitroglycerin, 2.5 mg verapamil intra arterial ANESTHESIA/SEDATION: 2.5 mg Versed, 125 mcg fentanyl 180 minutes sedation time CONTRAST:  VISIPAQUE IODIXANOL 320 MG/ML IV SOLN COMPLICATIONS: Small puncture site hematoma at the left thigh.  SIR category 1. PROCEDURE: The procedure, risks, benefits, and alternatives were explained to the patient and the patient's family. Specific  risks that were addressed included bleeding, infection, contrast reaction, kidney injury, need for further procedure, limb loss, cardiopulmonary collapse, death. Questions regarding the procedure were encouraged and answered. The patient understands and consents to the procedure. Proximal left thigh was prepped and draped in the usual sterile fashion. The left ankle was prepped and draped in the usual sterile fashion. Ultrasound survey of the left proximal thigh was performed with images stored and sent to PACs. A micropuncture needle was used access the left superficial femoral artery under ultrasound in antegrade fashion. With excellent arterial blood flow returned, and an .018 micro wire was passed through the needle, observed to enter the SFA under fluoroscopy. The needle was removed, and a micropuncture sheath was placed over the wire. The inner dilator and wire were removed, and an 035 Bentson wire was advanced under fluoroscopy. The sheath was removed and a standard 5 [place] vascular sheath was placed. The dilator was removed and the sheath was flushed. Angiogram was performed of the left lower extremity. Ultrasound survey of the left dorsalis pedis was performed with images stored and sent to PACs. A micropuncture needle was used to access the left dorsalis pedis  artery under ultrasound. With arterial blood flow returned, and an .018 micro wire was passed through the needle, observed to enter the distal anterior tibial artery under fluoroscopy. The needle was removed, and a micropuncture sheath was placed over the wire. The inner dilator and wire were removed. A pharmacologic cocktail was infused. Stiff Glidewire was then passed through the sheath in a retrograde fashion to the anterior tibial artery, traversing the occlusion at the proximal anterior tibial artery and at the popliteal artery. The wire stayed luminal in the superficial femoral artery. The SFA sheath was then upsized to 6 [place]. A snare was  used to snare the Glidewire from above. 035 quick cross catheter was then passed over the Glidewire to the distal anterior tibial artery. Down size to 018 system. Balloon angioplasty was then performed along the length of the anterior tibial artery with 2.0 mm diameter balloon, 2.5 mm diameter balloon, 3.0 mm diameter balloon. 2 mm balloon was used for hemostasis at the puncture site. Antegrade approach to the stump of the tibioperoneal trunk was used with a combination of Glidewire and quick cross catheter. Balloon angioplasty was performed with 2.5 mm diameter balloon and 3.0 mm diameter balloon at the origin, with restoration of flow. Spasm was induced in the mid and distal peroneal artery. Pedal pulse was confirmed at the conclusion of the case, which was not present at the initiation. All catheters and wires were removed. Sheath was removed from the superficial femoral artery, with manual pressure used for hemostasis. Patient tolerated the procedure well and remained hemodynamically stable throughout. No complications were encountered and no significant blood loss was encountered. FINDINGS: No palpable pulse at the initiation of the case. Doppler signals were present. Initial angiogram demonstrates no significant disease of the superficial femoral artery. Occlusion of the popliteal artery at the joint space of the knee. Occlusion extends through the popliteal artery, tibioperoneal trunk, and proximal anterior tibial artery. Reconstitution of the distal anterior tibial artery and of the peroneal artery. No posterior tibial filling was present. After revascularization, there is in-line flow through the popliteal artery, anterior tibial artery, and peroneal artery to the ankle. Spasm induced in the mid and distal peroneal artery, with palpable pulse at the completion of the angiogram/treatment. IMPRESSION: Status post left lower extremity angiogram with antegrade and retrograde approach to popliteal occlusion and  revascularization with plain balloon angioplasty restoring in-line flow through the popliteal artery, peroneal artery and anterior tibial artery to the ankle. Signed, Yvone Neu. Loreta Ave DO [facility] Radiology PLAN: p.o. Plavix 300 mg now. Left leg straight for 5 hours. Initiate 75 mg p.o. Plavix daily. Electronically Signed   By: Gilmer Mor D.O.   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 She has a vague history of what sounds like embolism to the popliteal artery and surgical thrombectomy in [place] many years prior. Prior inpatient noninvasive study demonstrates abnormal waveforms distally, with nonpalpable pulses of the foot. CT angiogram and runoff confirms a popliteal occlusion as well as tibial occlusions. She presents today for angiogram and attempt at revascularization for wound healing capability. EXAM: ULTRASOUND GUIDED ACCESS LEFT SUPERFICIAL FEMORAL ARTERY ULTRASOUND GUIDED ACCESS LEFT DORSALIS PEDIS REVASCULARIZATION OF POPLITEAL OCCLUSION WITH BALLOON ANGIOPLASTY OF POPLITEAL ARTERY REVASCULARIZATION OF LEFT ANTERIOR TIBIAL ARTERY WITH BALLOON ANGIOPLASTY REVASCULARIZATION OF LEFT TIBIOPERONEAL TRUNK AND  PERONEAL ARTERY WITH BALLOON ANGIOPLASTY FLUOROSCOPY TIME:  25 minutes 54 seconds MEDICATIONS AND MEDICAL HISTORY: 2 mg intra-arterial tPA, 800 mcg nitroglycerin, 2.5 mg verapamil intra arterial ANESTHESIA/SEDATION: 2.5 mg Versed, 125 mcg fentanyl 180 minutes sedation time CONTRAST:  VISIPAQUE IODIXANOL 320 MG/ML IV SOLN COMPLICATIONS: Small puncture site hematoma at the left thigh.  SIR category 1. PROCEDURE: The procedure, risks, benefits, and alternatives were explained to the patient and the patient's family. Specific risks that were addressed included bleeding, infection, contrast reaction,  kidney injury, need for further procedure, limb loss, cardiopulmonary collapse, death. Questions regarding the procedure were encouraged and answered. The patient understands and consents to the procedure. Proximal left thigh was prepped and draped in the usual sterile fashion. The left ankle was prepped and draped in the usual sterile fashion. Ultrasound survey of the left proximal thigh was performed with images stored and sent to PACs. A micropuncture needle was used access the left superficial femoral artery under ultrasound in antegrade fashion. With excellent arterial blood flow returned, and an .018 micro wire was passed through the needle, observed to enter the SFA under fluoroscopy. The needle was removed, and a micropuncture sheath was placed over the wire. The inner dilator and wire were removed, and an 035 Bentson wire was advanced under fluoroscopy. The sheath was removed and a standard 5 [place] vascular sheath was placed. The dilator was removed and the sheath was flushed. Angiogram was performed of the left lower extremity. Ultrasound survey of the left dorsalis pedis was performed with images stored and sent to PACs. A micropuncture needle was used to access the left dorsalis pedis artery under ultrasound. With arterial blood flow returned, and an .018 micro wire was passed through the needle, observed to enter the distal anterior tibial artery under fluoroscopy. The needle was removed, and a micropuncture sheath was placed over the wire. The inner dilator and wire were removed. A pharmacologic cocktail was infused. Stiff Glidewire was then passed through the sheath in a retrograde fashion to the anterior tibial artery, traversing the occlusion at the proximal anterior tibial artery and at the popliteal artery. The wire stayed luminal in the superficial femoral artery. The SFA sheath was then upsized to 6 [place]. A snare was used to snare the Glidewire from above. 035 quick cross catheter was then  passed over the Glidewire to the distal anterior tibial artery. Down size to 018 system. Balloon angioplasty was then performed along the length of the anterior tibial artery with 2.0 mm diameter balloon, 2.5 mm diameter balloon, 3.0 mm diameter balloon. 2 mm balloon was used for hemostasis at the puncture site. Antegrade approach to the stump of the tibioperoneal trunk was used with a combination of Glidewire and quick cross catheter. Balloon angioplasty was performed with 2.5 mm diameter balloon and 3.0 mm diameter balloon at the origin, with restoration of flow. Spasm was induced in the mid and distal peroneal artery. Pedal pulse was confirmed at the conclusion of the case, which was not present at the initiation. All catheters and wires were removed. Sheath was removed from the superficial femoral artery, with manual pressure used for hemostasis. Patient tolerated the procedure well and remained hemodynamically stable throughout. No complications were encountered and no significant blood loss was encountered. FINDINGS: No palpable pulse at the initiation of the case. Doppler signals were present. Initial angiogram demonstrates no significant disease of the superficial femoral artery. Occlusion of the popliteal artery at the joint space of the knee. Occlusion extends through  the popliteal artery, tibioperoneal trunk, and proximal anterior tibial artery. Reconstitution of the distal anterior tibial artery and of the peroneal artery. No posterior tibial filling was present. After revascularization, there is in-line flow through the popliteal artery, anterior tibial artery, and peroneal artery to the ankle. Spasm induced in the mid and distal peroneal artery, with palpable pulse at the completion of the angiogram/treatment. IMPRESSION: Status post left lower extremity angiogram with antegrade and retrograde approach to popliteal occlusion and revascularization with plain balloon angioplasty restoring in-line flow  through the popliteal artery, peroneal artery and anterior tibial artery to the ankle. Signed, Yvone Neu. Loreta Ave DO [facility] Radiology PLAN: p.o. Plavix 300 mg now. Left leg straight for 5 hours. Initiate 75 mg p.o. Plavix daily. Electronically Signed   By: Gilmer Mor D.O.   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 She has a vague history of what sounds like embolism to the popliteal artery and surgical thrombectomy in [place] many years prior. Prior inpatient noninvasive study demonstrates abnormal waveforms distally, with nonpalpable pulses of the foot. CT angiogram and runoff confirms a popliteal occlusion as well as tibial occlusions. She presents today for angiogram and attempt at revascularization for wound healing capability. EXAM: ULTRASOUND GUIDED ACCESS LEFT SUPERFICIAL FEMORAL ARTERY ULTRASOUND GUIDED ACCESS LEFT DORSALIS PEDIS REVASCULARIZATION OF POPLITEAL OCCLUSION WITH BALLOON ANGIOPLASTY OF POPLITEAL ARTERY REVASCULARIZATION OF LEFT ANTERIOR TIBIAL ARTERY WITH BALLOON ANGIOPLASTY REVASCULARIZATION OF LEFT TIBIOPERONEAL TRUNK AND PERONEAL ARTERY WITH BALLOON ANGIOPLASTY FLUOROSCOPY TIME:  25 minutes 54 seconds MEDICATIONS AND MEDICAL HISTORY: 2 mg intra-arterial tPA, 800 mcg nitroglycerin, 2.5 mg verapamil intra arterial ANESTHESIA/SEDATION: 2.5 mg Versed, 125 mcg fentanyl 180 minutes sedation time CONTRAST:  VISIPAQUE IODIXANOL 320 MG/ML IV SOLN COMPLICATIONS: Small puncture site hematoma at the left thigh.  SIR category 1. PROCEDURE: The procedure, risks, benefits, and alternatives were explained to the patient and the patient's family. Specific risks that were addressed included bleeding, infection, contrast reaction, kidney injury, need for further procedure, limb loss, cardiopulmonary collapse, death.  Questions regarding the procedure were encouraged and answered. The patient understands and consents to the procedure. Proximal left thigh was prepped and draped in the usual sterile fashion. The left ankle was prepped and draped in the usual sterile fashion. Ultrasound survey of the left proximal thigh was performed with images stored and sent to PACs. A micropuncture needle was used access the left superficial femoral artery under ultrasound in antegrade fashion. With excellent arterial blood flow returned, and an .018 micro wire was passed through the needle, observed to enter the SFA under fluoroscopy. The needle was removed, and a micropuncture sheath was placed over the wire. The inner dilator and wire were removed, and an 035 Bentson wire was advanced under fluoroscopy. The sheath was removed and a standard 5 [place] vascular sheath was placed. The dilator was removed and the sheath was flushed. Angiogram was performed of the left lower extremity. Ultrasound survey of the left dorsalis pedis was performed with images stored and sent to PACs. A micropuncture needle was used to access the left dorsalis pedis artery under ultrasound. With arterial blood flow returned, and an .018 micro wire was passed through the needle, observed to enter the distal anterior tibial artery under fluoroscopy. The needle was removed, and a micropuncture sheath was placed over the wire. The inner dilator  and wire were removed. A pharmacologic cocktail was infused. Stiff Glidewire was then passed through the sheath in a retrograde fashion to the anterior tibial artery, traversing the occlusion at the proximal anterior tibial artery and at the popliteal artery. The wire stayed luminal in the superficial femoral artery. The SFA sheath was then upsized to 6 [place]. A snare was used to snare the Glidewire from above. 035 quick cross catheter was then passed over the Glidewire to the distal anterior tibial artery. Down size to 018 system.  Balloon angioplasty was then performed along the length of the anterior tibial artery with 2.0 mm diameter balloon, 2.5 mm diameter balloon, 3.0 mm diameter balloon. 2 mm balloon was used for hemostasis at the puncture site. Antegrade approach to the stump of the tibioperoneal trunk was used with a combination of Glidewire and quick cross catheter. Balloon angioplasty was performed with 2.5 mm diameter balloon and 3.0 mm diameter balloon at the origin, with restoration of flow. Spasm was induced in the mid and distal peroneal artery. Pedal pulse was confirmed at the conclusion of the case, which was not present at the initiation. All catheters and wires were removed. Sheath was removed from the superficial femoral artery, with manual pressure used for hemostasis. Patient tolerated the procedure well and remained hemodynamically stable throughout. No complications were encountered and no significant blood loss was encountered. FINDINGS: No palpable pulse at the initiation of the case. Doppler signals were present. Initial angiogram demonstrates no significant disease of the superficial femoral artery. Occlusion of the popliteal artery at the joint space of the knee. Occlusion extends through the popliteal artery, tibioperoneal trunk, and proximal anterior tibial artery. Reconstitution of the distal anterior tibial artery and of the peroneal artery. No posterior tibial filling was present. After revascularization, there is in-line flow through the popliteal artery, anterior tibial artery, and peroneal artery to the ankle. Spasm induced in the mid and distal peroneal artery, with palpable pulse at the completion of the angiogram/treatment. IMPRESSION: Status post left lower extremity angiogram with antegrade and retrograde approach to popliteal occlusion and revascularization with plain balloon angioplasty restoring in-line flow through the popliteal artery, peroneal artery and anterior tibial artery to the ankle.  Signed, Yvone Neu. Loreta Ave DO [facility] Radiology PLAN: p.o. Plavix 300 mg now. Left leg straight for 5 hours. Initiate 75 mg p.o. Plavix daily. Electronically Signed   By: Gilmer Mor D.O.   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 She has a vague history of what sounds like embolism to the popliteal artery and surgical thrombectomy in [place] many years prior. Prior inpatient noninvasive study demonstrates abnormal waveforms distally, with nonpalpable pulses of the foot. CT angiogram and runoff confirms a popliteal occlusion as well as tibial occlusions. She presents today for angiogram and attempt at revascularization for wound healing capability. EXAM: ULTRASOUND GUIDED ACCESS LEFT SUPERFICIAL FEMORAL ARTERY ULTRASOUND GUIDED ACCESS LEFT DORSALIS PEDIS REVASCULARIZATION OF POPLITEAL OCCLUSION WITH BALLOON ANGIOPLASTY OF POPLITEAL ARTERY REVASCULARIZATION OF LEFT ANTERIOR TIBIAL ARTERY WITH BALLOON ANGIOPLASTY REVASCULARIZATION OF LEFT TIBIOPERONEAL TRUNK AND PERONEAL ARTERY WITH BALLOON ANGIOPLASTY FLUOROSCOPY TIME:  25 minutes 54 seconds MEDICATIONS AND MEDICAL HISTORY: 2 mg intra-arterial tPA, 800 mcg nitroglycerin, 2.5 mg verapamil intra arterial ANESTHESIA/SEDATION: 2.5 mg Versed, 125 mcg fentanyl 180 minutes sedation time CONTRAST:  VISIPAQUE IODIXANOL 320 MG/ML IV SOLN  COMPLICATIONS: Small puncture site hematoma at the left thigh.  SIR category 1. PROCEDURE: The procedure, risks, benefits, and alternatives were explained to the patient and the patient's family. Specific risks that were addressed included bleeding, infection, contrast reaction, kidney injury, need for further procedure, limb loss, cardiopulmonary collapse, death. Questions regarding the procedure were encouraged and answered. The patient understands  and consents to the procedure. Proximal left thigh was prepped and draped in the usual sterile fashion. The left ankle was prepped and draped in the usual sterile fashion. Ultrasound survey of the left proximal thigh was performed with images stored and sent to PACs. A micropuncture needle was used access the left superficial femoral artery under ultrasound in antegrade fashion. With excellent arterial blood flow returned, and an .018 micro wire was passed through the needle, observed to enter the SFA under fluoroscopy. The needle was removed, and a micropuncture sheath was placed over the wire. The inner dilator and wire were removed, and an 035 Bentson wire was advanced under fluoroscopy. The sheath was removed and a standard 5 [place] vascular sheath was placed. The dilator was removed and the sheath was flushed. Angiogram was performed of the left lower extremity. Ultrasound survey of the left dorsalis pedis was performed with images stored and sent to PACs. A micropuncture needle was used to access the left dorsalis pedis artery under ultrasound. With arterial blood flow returned, and an .018 micro wire was passed through the needle, observed to enter the distal anterior tibial artery under fluoroscopy. The needle was removed, and a micropuncture sheath was placed over the wire. The inner dilator and wire were removed. A pharmacologic cocktail was infused. Stiff Glidewire was then passed through the sheath in a retrograde fashion to the anterior tibial artery, traversing the occlusion at the proximal anterior tibial artery and at the popliteal artery. The wire stayed luminal in the superficial femoral artery. The SFA sheath was then upsized to 6 [place]. A snare was used to snare the Glidewire from above. 035 quick cross catheter was then passed over the Glidewire to the distal anterior tibial artery. Down size to 018 system. Balloon angioplasty was then performed along the length of the anterior tibial artery  with 2.0 mm diameter balloon, 2.5 mm diameter balloon, 3.0 mm diameter balloon. 2 mm balloon was used for hemostasis at the puncture site. Antegrade approach to the stump of the tibioperoneal trunk was used with a combination of Glidewire and quick cross catheter. Balloon angioplasty was performed with 2.5 mm diameter balloon and 3.0 mm diameter balloon at the origin, with restoration of flow. Spasm was induced in the mid and distal peroneal artery. Pedal pulse was confirmed at the conclusion of the case, which was not present at the initiation. All catheters and wires were removed. Sheath was removed from the superficial femoral artery, with manual pressure used for hemostasis. Patient tolerated the procedure well and remained hemodynamically stable throughout. No complications were encountered and no significant blood loss was encountered. FINDINGS: No palpable pulse at the initiation of the case. Doppler signals were present. Initial angiogram demonstrates no significant disease of the superficial femoral artery. Occlusion of the popliteal artery at the joint space of the knee. Occlusion extends through the popliteal artery, tibioperoneal trunk, and proximal anterior tibial artery. Reconstitution of the distal anterior tibial artery and of the peroneal artery. No posterior tibial filling was present. After revascularization, there is in-line flow through the popliteal artery, anterior tibial artery, and peroneal artery to the ankle. Spasm  induced in the mid and distal peroneal artery, with palpable pulse at the completion of the angiogram/treatment. IMPRESSION: Status post left lower extremity angiogram with antegrade and retrograde approach to popliteal occlusion and revascularization with plain balloon angioplasty restoring in-line flow through the popliteal artery, peroneal artery and anterior tibial artery to the ankle. Signed, Yvone Neu. Loreta Ave DO [facility]  Radiology PLAN: p.o. Plavix 300 mg now. Left leg straight for 5 hours. Initiate 75 mg p.o. Plavix daily. Electronically Signed   By: Gilmer Mor D.O.   On: 05/09/2015 16:00    Labs:  CBC:  Recent Labs  05/11/15 0330 05/12/15 0350 05/13/15 0318 05/28/15 1208  WBC 16.6* 11.9* 6.7 7.4  HGB 8.7* 9.0* 9.0* 10.3*  HCT 26.6* 27.8* 28.5* 32.5*  PLT 146* 138* 131* 229.0    COAGS:  Recent Labs  01/23/15 1246  05/09/15 0719  05/11/15 1100 05/12/15 0350 05/13/15 0318 05/17/15  INR 2.89*  < > 1.25  < > 1.68* 1.32 1.17 1.9*  APTT 40*  --  32  --   --   --   --   --   < > = values in this interval not displayed.  BMP:  Recent Labs  05/10/15 0143 05/11/15 0330 05/12/15 0350 05/13/15 0318 05/28/15 1208  NA 140 141 144 142 145  K 3.8 3.6 3.8 3.8 5.4*  CL 101 110 114* 114* 105  CO2 23 25 20* 23 32  GLUCOSE 98 89 87 88 81  BUN 28* 28* 18 15 25*  CALCIUM 8.3* 7.9* 8.3* 8.0* 9.0  CREATININE 1.83* 1.19* 0.95 1.00 1.02  GFRNONAA 24* 41* 54* 51*  --   GFRAA 28* 48* >60 59*  --     LIVER FUNCTION TESTS:  Recent Labs  01/26/15 0250 02/14/15 1331 05/10/15 0143 05/11/15 0330  BILITOT 0.5 0.7 0.9 2.5*  AST 33 21 74* 112*  ALT 18 8 23  50  ALKPHOS 58 76 52 46  PROT 6.1* 7.6 5.6* 5.5*  ALBUMIN 2.3* 3.3* 2.2* 1.9*    TUMOR MARKERS: No results for input(s): AFPTM, CEA, CA199, CHROMGRNA in the last 8760 hours.  Assessment and Plan:  Ms Junker is an 82 year old woman who has a chronic left lower extremity wound, which I believe has a significant venous component in addition to the arterial insufficiency which we have previously addressed. Our angiogram has restoring in-line blood flow through a revisit occluded popliteal artery into the tibial vessels, with flow continuous to the ankle on our prior study. Noninvasive arterial exam performed today demonstrates ABI of 0.59, which is abnormal though improved, and there is signal at the posterior tibial artery and anterior tibial  artery. To what degree this study is limited by artifact given her chronic wound is uncertain.  CT exam which was performed previously 04/25/2015 demonstrates chronic body wall venous collaterals indicating venous obstruction, both on the right and the left. In addition, the characteristics of her wound are typical of venous component.  I had a long discussion regarding her complex wound, the arterial component which we have addressed, and my impression that there is a persisting significant venous component. I discussed with her a venogram of the left lower extremity and possible reconstruction in order to help her healing process. I talked with her about similar risk profile for the venous procedure, which include bleeding, infection, kidney injury, contrast reaction (which she has a history), need for further procedure/surgery, cardiopulmonary collapse, death.   I did tell  her that my plan is to order a left lower extremity DVT study so that we can evaluate level of occlusion/involvement for any potential venogram. Venous reconstruction can be very effective for wound healing, which I have offered her.   She understands our discussion regarding the venous outflow obstruction. She wishes to think about the possibility of proceeding, discuss with her family, continue her wound care for now, and potentially consider the venogram in the future. Should she wish to proceed, we can perform the venography with CO2 contrast.  In the meantime I have encouraged her to continue her wound care appointments.    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

## 2015-06-08 ENCOUNTER — Emergency Department (HOSPITAL_COMMUNITY)
Admission: EM | Admit: 2015-06-08 | Discharge: 2015-06-09 | Disposition: A | Discharge status: Home / Self Care | Payer: Medicare Other | Source: Home / Self Care | Attending: Emergency Medicine | Admitting: Emergency Medicine

## 2015-06-08 ENCOUNTER — Encounter (HOSPITAL_COMMUNITY): Payer: Self-pay | Admitting: Emergency Medicine

## 2015-06-08 ENCOUNTER — Emergency Department (HOSPITAL_COMMUNITY): Payer: Medicare Other

## 2015-06-08 DIAGNOSIS — A419 Sepsis, unspecified organism: Secondary | ICD-10-CM | POA: Diagnosis not present

## 2015-06-08 DIAGNOSIS — Z86718 Personal history of other venous thrombosis and embolism: Secondary | ICD-10-CM | POA: Insufficient documentation

## 2015-06-08 DIAGNOSIS — Z79891 Long term (current) use of opiate analgesic: Secondary | ICD-10-CM | POA: Insufficient documentation

## 2015-06-08 DIAGNOSIS — Z79899 Other long term (current) drug therapy: Secondary | ICD-10-CM | POA: Insufficient documentation

## 2015-06-08 DIAGNOSIS — Z8781 Personal history of (healed) traumatic fracture: Secondary | ICD-10-CM

## 2015-06-08 DIAGNOSIS — Z7982 Long term (current) use of aspirin: Secondary | ICD-10-CM

## 2015-06-08 DIAGNOSIS — I509 Heart failure, unspecified: Secondary | ICD-10-CM | POA: Insufficient documentation

## 2015-06-08 DIAGNOSIS — Y92091 Bathroom in other non-institutional residence as the place of occurrence of the external cause: Secondary | ICD-10-CM

## 2015-06-08 DIAGNOSIS — M1990 Unspecified osteoarthritis, unspecified site: Secondary | ICD-10-CM

## 2015-06-08 DIAGNOSIS — S3992XA Unspecified injury of lower back, initial encounter: Secondary | ICD-10-CM | POA: Insufficient documentation

## 2015-06-08 DIAGNOSIS — Y998 Other external cause status: Secondary | ICD-10-CM | POA: Insufficient documentation

## 2015-06-08 DIAGNOSIS — R509 Fever, unspecified: Secondary | ICD-10-CM | POA: Diagnosis not present

## 2015-06-08 DIAGNOSIS — I10 Essential (primary) hypertension: Secondary | ICD-10-CM | POA: Insufficient documentation

## 2015-06-08 DIAGNOSIS — I4891 Unspecified atrial fibrillation: Secondary | ICD-10-CM

## 2015-06-08 DIAGNOSIS — Y9389 Activity, other specified: Secondary | ICD-10-CM

## 2015-06-08 DIAGNOSIS — E039 Hypothyroidism, unspecified: Secondary | ICD-10-CM | POA: Insufficient documentation

## 2015-06-08 DIAGNOSIS — W1812XA Fall from or off toilet with subsequent striking against object, initial encounter: Secondary | ICD-10-CM

## 2015-06-08 DIAGNOSIS — W19XXXA Unspecified fall, initial encounter: Secondary | ICD-10-CM

## 2015-06-08 DIAGNOSIS — Z872 Personal history of diseases of the skin and subcutaneous tissue: Secondary | ICD-10-CM | POA: Insufficient documentation

## 2015-06-08 NOTE — ED Notes (Signed)
Per [facility]: Patient to ED from [facility] after unwitnessed fall (husband heard her fall around the corner and found her on floor seconds later - was alert and oriented entire time). Patient was standing in restroom and turned to toilet, lost balance and fell on bottom - banged lower back on shower edge (3 inches off floor). Pt has hx chronic lumbar fractures - c/o lower back pain. Patient denies hitting head, no LOC. A&O x 4. Pt in A-Fib - normal. Patient's posture is hunched over - also normal for pt. Also has wound on L leg - chronic. Denies other injuries.

## 2015-06-08 NOTE — ED Provider Notes (Signed)
CSN: [id number]     Arrival date & time 06/08/15  2215 History   First MD Initiated Contact with Patient 06/08/15 2216     Chief Complaint  Patient presents with  . Fall   Patient is a 82 y.o. female presenting with fall. The history is provided by the patient and the EMS personnel. No language interpreter was used.  Fall This is a new problem. The current episode started today. The problem occurs rarely. The problem has been resolved. Pertinent negatives include no abdominal pain, chest pain, chills, congestion, coughing, fever, neck pain, numbness, rash or vertigo. Nothing aggravates the symptoms. She has tried nothing for the symptoms. The treatment provided mild relief.    Past Medical History  Diagnosis Date  . PAD (peripheral artery disease) (HCC) 2013    left leg. sounds like she had angio-plasty   . Atrial fibrillation (HCC) 2013    s/p [facility] that failed. ON medication  . Hypertension   . Osteoporosis   . DVT (deep venous thrombosis) (HCC) 2013    "LLE" (08/04/2012)  . Hypothyroidism   . History of blood transfusion 2013    "related to thrombectomy" (08/04/2012)  . Anemia     "slightly" (08/04/2012)  . Arthritis     "probably minor; knees, pinky" (08/04/2012)  . Fracture of L4 vertebra (HCC)     "dx'd in 2013; can't treat til legs fixed" (08/04/2012)  . Lower extremity ulceration (HCC)   . Biventricular heart failure with reduced left ventricular function [facility])    Past Surgical History  Procedure Laterality Date  . Thrombectomy Left 2013    "leg" (08/04/2012)  . G2p2    . Tonsillectomy and adenoidectomy  ~ 1938  . Dilation and curettage of uterus  1960's   Family History  Problem Relation Age of Onset  . Arthritis Mother   . Cancer Mother     breast cancer   . Cancer Brother     lung cancer  . Tuberculosis Father   . Cancer Sister     stg 4 breast cancer  . Diabetes Neg Hx   . Heart disease Neg Hx   . COPD Neg Hx    Social History  Substance Use Topics  .  Smoking status: Never Smoker   . Smokeless tobacco: Never Used  . Alcohol Use: 0.0 oz/week     Comment: 08/04/2012 "used to have 1-2 glasses of wine/wk; nothing for the past 1 yr"   OB History    No data available     Review of Systems  Constitutional: Negative for fever, chills and activity change.  HENT: Negative for congestion.   Eyes: Negative for photophobia, pain, redness and visual disturbance.  Respiratory: Negative for cough.   Cardiovascular: Negative for chest pain, palpitations and leg swelling.  Gastrointestinal: Negative for abdominal pain.  Genitourinary: Negative for dysuria and flank pain.  Musculoskeletal: Positive for back pain. Negative for gait problem and neck pain.  Skin: Negative for rash and wound.  Neurological: Negative for dizziness, vertigo, syncope, facial asymmetry and numbness.  Psychiatric/Behavioral: Negative for behavioral problems, confusion and agitation.      Allergies  Bactrim ds; Contrast media; Ciprofloxacin; and Tape  Home Medications   Prior to Admission medications   Medication Sig Start Date End Date Taking? Authorizing Provider  Amino Acids-Protein Hydrolys (FEEDING SUPPLEMENT, PRO-STAT SUGAR FREE 64,) LIQD Take 30 mLs by mouth 2 (two) times daily.   Yes Historical Provider, MD  aspirin EC 81 MG EC tablet  Take 1 tablet (81 mg total) by mouth daily. 05/13/15  Yes Maryann Mikhail, DO  docusate sodium (COLACE) 100 MG capsule Take 100 mg by mouth daily.   Yes Historical Provider, MD  levothyroxine (SYNTHROID, LEVOTHROID) 25 MCG tablet TAKE 1 AND 1/2 TABS (37.5MG ) BY MOUTH EVERY DAY*CHECK PULSE WEEKLY* 07/14/12  Yes Jacques Navy, MD  metoprolol succinate (TOPROL-XL) 25 MG 24 hr tablet Take 0.5 tablets (12.5 mg total) by mouth daily. 11/01/12  Yes Jacques Navy, MD  Multiple Vitamin (MULTIVITAMIN WITH MINERALS) TABS Take 1 tablet by mouth every morning.   Yes Historical Provider, MD  oxyCODONE (OXY IR/ROXICODONE) 5 MG immediate release  tablet Take 5 mg by mouth every 6 (six) hours as needed for moderate pain or severe pain.   Yes Historical Provider, MD  oxyCODONE (OXYCONTIN) 10 mg 12 hr tablet Take 1 tablet (10 mg total) by mouth every 12 (twelve) hours. 05/28/15  Yes Gordy Savers, MD  potassium chloride (K-DUR,KLOR-CON) 10 MEQ tablet Take 1 tablet (10 mEq total) by mouth 2 (two) times daily. 08/11/12  Yes Hadassah Pais, PA-C  torsemide (DEMADEX) 20 MG tablet TAKE 1 TAB BY MOUTH EVERY DAY -- DX: EDEMA AND TAKE 1 TAB BY MOUTH AT 5PM ON MON WED FRI (THIS IS.IN ADDITION TO MORNING SCHEDULE DOSE) -... 04/24/14  Yes Laurey Morale, MD  torsemide [facility]) 20 MG tablet Take 20 mg by mouth every Monday, Wednesday, and Friday. At 5 pm in addition to daily torsemide    Yes Historical Provider, MD  UNABLE TO FIND Take 118 mLs by mouth 2 (two) times daily. Med Name: Mighty Shakes 12 noon and 5 pm   Yes Historical Provider, MD  cetirizine (ZYRTEC) 10 MG tablet Take 1 tablet (10 mg total) by mouth 2 (two) times daily. X 14 days Patient not taking: Reported on 06/06/2015 05/01/15   Nelwyn Salisbury, MD  warfarin (COUMADIN) 3 MG tablet Take 4.5mg  (one and a half pills) everyday except Tuesday and Friday. Take  (one pill) on Tuesday and Friday. Patient not taking: Reported on 06/08/2015 05/14/15   Maryann Mikhail, DO   BP 96/60 mmHg  Pulse 98  Resp 17  Ht  (1.626 m)  Wt 56.246 kg  BMI 21.27 kg/m2  SpO2 96% Physical Exam  Constitutional: She appears well-developed and well-nourished. No distress.  HENT:  Head: Normocephalic and atraumatic.  Eyes: Pupils are equal, round, and reactive to light.  Neck: Normal range of motion.  Cardiovascular: Normal heart sounds and intact distal pulses.  Exam reveals no gallop and no friction rub.   No murmur heard. Pulmonary/Chest: No respiratory distress. She exhibits no tenderness.  Abdominal: She exhibits no distension. There is no tenderness. There is no rebound and no guarding.   Musculoskeletal:       Cervical back: She exhibits no tenderness.       Thoracic back: She exhibits no tenderness.       Lumbar back: She exhibits tenderness.  Skin: She is not diaphoretic.  Nursing note and vitals reviewed.   ED Course  Procedures (including critical care time) Labs Review Labs Reviewed - No data to display  Imaging Review Ct Lumbar Spine Wo Contrast  06/09/2015  CLINICAL DATA:  82 year old female with lower back pain evaluate for lumbar spine fractures. EXAM: CT LUMBAR SPINE WITHOUT CONTRAST TECHNIQUE: Multidetector CT imaging of the lumbar spine was performed without intravenous contrast administration. Multiplanar CT image reconstructions were also generated. COMPARISON:  CT and  radiographs dated 05/10/2015 FINDINGS: There is compression fracture of the L2 vertebra with near complete loss of vertebral body height and anterior wedging similar prior study. There is approximately 4 mm retropulsion of the superior posterior cortex of the L2 with mild focal narrowing of the central canal similar to prior study. There is resulting kyphosis of the spine at L2. No new fracture identified. The bones are osteopenic. Multilevel disc desiccation with vacuum phenomenon noted. There is grade 1 L5-S1 anterolisthesis. Constipation. No definite evidence of bowel obstruction. A 1.5 cm ill-defined hypodensity in the region of the head of the pancreas. IMPRESSION: L2 compression fracture, similar prior study with near complete loss of vertebral body height and anterior wedging. No new fracture identified. Electronically Signed   By: Elgie Collard M.D.   On: 06/09/2015 00:43   I have personally reviewed and evaluated these images and lab results as part of my medical decision-making.   EKG Interpretation None      MDM   Final diagnoses:  Fall, initial encounter   Patient presents via EMS for evaluation of mechanical fall at home today. She hit her back when she tripped. She did not  hit her head or lose consciousness.  Upon arrival patient with unremarkable vital signs. She is alert and oriented 3. GCS of 15.  No C or T-spine tenderness on exam. Patient with L-spine tenderness.  CT scan performed which shows L2 compression fracture similar to prior study without new fracture.  1:10 AM: Patient reevaluated with some cramping lower back pain. GCS of 15.  Robaxin given for muscle cramping. Recommend heating pads at home. Patient at baseline is ambulatory only with walker and spends most of her time in wheelchair.  Okay for discharge home to facility.  Discussed with my attending Dr. Jeraldine Loots.    Dan Humphreys, MD 06/09/15 1308  Gerhard Munch, MD 06/09/15 [phone]

## 2015-06-09 MED ORDER — METHOCARBAMOL 500 MG PO TABS
500.0000 mg | ORAL_TABLET | Freq: Once | ORAL | Status: AC
Start: 2015-06-09 — End: 2015-06-09
  Administered 2015-06-09: 500 mg via ORAL
  Filled 2015-06-09: qty 1

## 2015-06-09 NOTE — Discharge Instructions (Signed)
Fall Prevention in the Home  Falls can cause injuries and can affect people from all age groups. There are many simple things that you can do to make your home safe and to help prevent falls. WHAT CAN I DO ON THE OUTSIDE OF MY HOME?  Regularly repair the edges of walkways and driveways and fix any cracks.  Remove high doorway thresholds.  Trim any shrubbery on the main path into your home.  Use bright outdoor lighting.  Clear walkways of debris and clutter, including tools and rocks.  Regularly check that handrails are securely fastened and in good repair. Both sides of any steps should have handrails.  Install guardrails along the edges of any raised decks or porches.  Have leaves, snow, and ice cleared regularly.  Use sand or salt on walkways during winter months.  In the garage, clean up any spills right away, including grease or oil spills. WHAT CAN I DO IN THE BATHROOM?  Use night lights.  Install grab bars by the toilet and in the tub and shower. Do not use towel bars as grab bars.  Use non-skid mats or decals on the floor of the tub or shower.  If you need to sit down while you are in the shower, use a plastic, non-slip stool..  Keep the floor dry. Immediately clean up any water that spills on the floor.  Remove soap buildup in the tub or shower on a regular basis.  Attach bath mats securely with double-sided non-slip rug tape.  Remove throw rugs and other tripping hazards from the floor. WHAT CAN I DO IN THE BEDROOM?  Use night lights.  Make sure that a bedside light is easy to reach.  Do not use oversized bedding that drapes onto the floor.  Have a firm chair that has side arms to use for getting dressed.  Remove throw rugs and other tripping hazards from the floor. WHAT CAN I DO IN THE KITCHEN?   Clean up any spills right away.  Avoid walking on wet floors.  Place frequently used items in easy-to-reach places.  If you need to reach for something  above you, use a sturdy step stool that has a grab bar.  Keep electrical cables out of the way.  Do not use floor polish or wax that makes floors slippery. If you have to use wax, make sure that it is non-skid floor wax.  Remove throw rugs and other tripping hazards from the floor. WHAT CAN I DO IN THE STAIRWAYS?  Do not leave any items on the stairs.  Make sure that there are handrails on both sides of the stairs. Fix handrails that are broken or loose. Make sure that handrails are as long as the stairways.  Check any carpeting to make sure that it is firmly attached to the stairs. Fix any carpet that is loose or worn.  Avoid having throw rugs at the top or bottom of stairways, or secure the rugs with carpet tape to prevent them from moving.  Make sure that you have a light switch at the top of the stairs and the bottom of the stairs. If you do not have them, have them installed. WHAT ARE SOME OTHER FALL PREVENTION TIPS?  Wear closed-toe shoes that fit well and support your feet. Wear shoes that have rubber soles or low heels.  When you use a stepladder, make sure that it is completely opened and that the sides are firmly locked. Have someone hold the ladder while you   are using it. Do not climb a closed stepladder.  Add color or contrast paint or tape to grab bars and handrails in your home. Place contrasting color strips on the first and last steps.  Use mobility aids as needed, such as canes, walkers, scooters, and crutches.  Turn on lights if it is dark. Replace any light bulbs that burn out.  Set up furniture so that there are clear paths. Keep the furniture in the same spot.  Fix any uneven floor surfaces.  Choose a carpet design that does not hide the edge of steps of a stairway.  Be aware of any and all pets.  Review your medicines with your healthcare provider. Some medicines can cause dizziness or changes in blood pressure, which increase your risk of falling. Talk  with your health care provider about other ways that you can decrease your risk of falls. This may include working with a physical therapist or trainer to improve your strength, balance, and endurance.   This information is not intended to replace advice given to you by your health care provider. Make sure you discuss any questions you have with your health care provider.   Document Released: 04/03/2002 Document Revised: 08/28/2014 Document Reviewed: 05/18/2014 Elsevier Interactive Patient Education 2016 [employer] Inc.  

## 2015-06-09 NOTE — ED Notes (Signed)
Patient verbalized understanding of discharge instructions and denies any further needs or questions at this time. VS stable. Given scrub pants to go home in. Wheeled to ED entrance.

## 2015-06-10 ENCOUNTER — Inpatient Hospital Stay (HOSPITAL_COMMUNITY)
Admission: EM | Admit: 2015-06-10 | Discharge: 2015-06-20 | DRG: 871 | Disposition: A | Discharge status: Skilled Nursing Facility | Payer: Medicare Other | Attending: Family Medicine | Admitting: Family Medicine

## 2015-06-10 ENCOUNTER — Emergency Department (HOSPITAL_COMMUNITY): Payer: Medicare Other

## 2015-06-10 ENCOUNTER — Telehealth: Payer: Self-pay | Admitting: Internal Medicine

## 2015-06-10 ENCOUNTER — Encounter (HOSPITAL_COMMUNITY): Payer: Self-pay | Admitting: Emergency Medicine

## 2015-06-10 DIAGNOSIS — N179 Acute kidney failure, unspecified: Secondary | ICD-10-CM | POA: Diagnosis not present

## 2015-06-10 DIAGNOSIS — Z86718 Personal history of other venous thrombosis and embolism: Secondary | ICD-10-CM

## 2015-06-10 DIAGNOSIS — S81802S Unspecified open wound, left lower leg, sequela: Secondary | ICD-10-CM

## 2015-06-10 DIAGNOSIS — I998 Other disorder of circulatory system: Secondary | ICD-10-CM | POA: Diagnosis not present

## 2015-06-10 DIAGNOSIS — L03116 Cellulitis of left lower limb: Secondary | ICD-10-CM | POA: Diagnosis present

## 2015-06-10 DIAGNOSIS — A419 Sepsis, unspecified organism: Principal | ICD-10-CM | POA: Diagnosis present

## 2015-06-10 DIAGNOSIS — Z7982 Long term (current) use of aspirin: Secondary | ICD-10-CM

## 2015-06-10 DIAGNOSIS — I959 Hypotension, unspecified: Secondary | ICD-10-CM | POA: Diagnosis present

## 2015-06-10 DIAGNOSIS — I5042 Chronic combined systolic (congestive) and diastolic (congestive) heart failure: Secondary | ICD-10-CM | POA: Diagnosis present

## 2015-06-10 DIAGNOSIS — L97929 Non-pressure chronic ulcer of unspecified part of left lower leg with unspecified severity: Secondary | ICD-10-CM | POA: Diagnosis present

## 2015-06-10 DIAGNOSIS — I5022 Chronic systolic (congestive) heart failure: Secondary | ICD-10-CM | POA: Diagnosis not present

## 2015-06-10 DIAGNOSIS — E038 Other specified hypothyroidism: Secondary | ICD-10-CM

## 2015-06-10 DIAGNOSIS — I82402 Acute embolism and thrombosis of unspecified deep veins of left lower extremity: Secondary | ICD-10-CM

## 2015-06-10 DIAGNOSIS — R509 Fever, unspecified: Secondary | ICD-10-CM | POA: Diagnosis present

## 2015-06-10 DIAGNOSIS — I70229 Atherosclerosis of native arteries of extremities with rest pain, unspecified extremity: Secondary | ICD-10-CM | POA: Diagnosis present

## 2015-06-10 DIAGNOSIS — R011 Cardiac murmur, unspecified: Secondary | ICD-10-CM | POA: Diagnosis not present

## 2015-06-10 DIAGNOSIS — E039 Hypothyroidism, unspecified: Secondary | ICD-10-CM | POA: Diagnosis present

## 2015-06-10 DIAGNOSIS — Z66 Do not resuscitate: Secondary | ICD-10-CM | POA: Diagnosis present

## 2015-06-10 DIAGNOSIS — Z6821 Body mass index (BMI) 21.0-21.9, adult: Secondary | ICD-10-CM | POA: Diagnosis not present

## 2015-06-10 DIAGNOSIS — Z7901 Long term (current) use of anticoagulants: Secondary | ICD-10-CM | POA: Diagnosis not present

## 2015-06-10 DIAGNOSIS — R6520 Severe sepsis without septic shock: Secondary | ICD-10-CM | POA: Diagnosis present

## 2015-06-10 DIAGNOSIS — I70248 Atherosclerosis of native arteries of left leg with ulceration of other part of lower left leg: Secondary | ICD-10-CM | POA: Diagnosis present

## 2015-06-10 DIAGNOSIS — W19XXXA Unspecified fall, initial encounter: Secondary | ICD-10-CM

## 2015-06-10 DIAGNOSIS — M810 Age-related osteoporosis without current pathological fracture: Secondary | ICD-10-CM | POA: Diagnosis present

## 2015-06-10 DIAGNOSIS — I739 Peripheral vascular disease, unspecified: Secondary | ICD-10-CM | POA: Diagnosis present

## 2015-06-10 DIAGNOSIS — S81802A Unspecified open wound, left lower leg, initial encounter: Secondary | ICD-10-CM | POA: Diagnosis not present

## 2015-06-10 DIAGNOSIS — E43 Unspecified severe protein-calorie malnutrition: Secondary | ICD-10-CM | POA: Insufficient documentation

## 2015-06-10 DIAGNOSIS — K5900 Constipation, unspecified: Secondary | ICD-10-CM | POA: Diagnosis present

## 2015-06-10 DIAGNOSIS — I482 Chronic atrial fibrillation: Secondary | ICD-10-CM | POA: Diagnosis not present

## 2015-06-10 DIAGNOSIS — I4891 Unspecified atrial fibrillation: Secondary | ICD-10-CM | POA: Diagnosis not present

## 2015-06-10 DIAGNOSIS — I110 Hypertensive heart disease with heart failure: Secondary | ICD-10-CM | POA: Diagnosis present

## 2015-06-10 DIAGNOSIS — I9589 Other hypotension: Secondary | ICD-10-CM | POA: Diagnosis not present

## 2015-06-10 DIAGNOSIS — M40209 Unspecified kyphosis, site unspecified: Secondary | ICD-10-CM | POA: Diagnosis present

## 2015-06-10 LAB — CBC WITH DIFFERENTIAL/PLATELET
Basophils Absolute: 0 10*3/uL (ref 0.0–0.1)
Basophils Relative: 0 %
Eosinophils Absolute: 0.1 10*3/uL (ref 0.0–0.7)
Eosinophils Relative: 1 %
HEMATOCRIT: 30.2 % — AB (ref 36.0–46.0)
HEMOGLOBIN: 9.4 g/dL — AB (ref 12.0–15.0)
LYMPHS ABS: 0.8 10*3/uL (ref 0.7–4.0)
LYMPHS PCT: 9 %
MCH: 29.3 pg (ref 26.0–34.0)
MCHC: 31.1 g/dL (ref 30.0–36.0)
MCV: 94.1 fL (ref 78.0–100.0)
MONOS PCT: 17 %
Monocytes Absolute: 1.6 10*3/uL — ABNORMAL HIGH (ref 0.1–1.0)
NEUTROS ABS: 6.6 10*3/uL (ref 1.7–7.7)
NEUTROS PCT: 73 %
Platelets: 176 10*3/uL (ref 150–400)
RBC: 3.21 MIL/uL — AB (ref 3.87–5.11)
RDW: 18.3 % — ABNORMAL HIGH (ref 11.5–15.5)
WBC: 9.1 10*3/uL (ref 4.0–10.5)

## 2015-06-10 LAB — I-STAT CG4 LACTIC ACID, ED
LACTIC ACID, VENOUS: 1.93 mmol/L (ref 0.5–2.0)
Lactic Acid, Venous: 1.31 mmol/L (ref 0.5–2.0)

## 2015-06-10 LAB — COMPREHENSIVE METABOLIC PANEL
ALT: 22 U/L (ref 14–54)
AST: 37 U/L (ref 15–41)
Albumin: 2.8 g/dL — ABNORMAL LOW (ref 3.5–5.0)
Alkaline Phosphatase: 70 U/L (ref 38–126)
Anion gap: 10 (ref 5–15)
BUN: 50 mg/dL — ABNORMAL HIGH (ref 6–20)
CHLORIDE: 107 mmol/L (ref 101–111)
CO2: 26 mmol/L (ref 22–32)
Calcium: 8.6 mg/dL — ABNORMAL LOW (ref 8.9–10.3)
Creatinine, Ser: 1.78 mg/dL — ABNORMAL HIGH (ref 0.44–1.00)
GFR, EST AFRICAN AMERICAN: 29 mL/min — AB (ref >60)
GFR, EST NON AFRICAN AMERICAN: 25 mL/min — AB (ref >60)
Glucose, Bld: 127 mg/dL — ABNORMAL HIGH (ref 65–99)
POTASSIUM: 4.6 mmol/L (ref 3.5–5.1)
SODIUM: 143 mmol/L (ref 135–145)
Total Bilirubin: 1.3 mg/dL — ABNORMAL HIGH (ref 0.3–1.2)
Total Protein: 7.2 g/dL (ref 6.5–8.1)

## 2015-06-10 LAB — PROTIME-INR
INR: 5.1 (ref 0.00–1.49)
PROTHROMBIN TIME: 45.6 s — AB (ref 11.6–15.2)

## 2015-06-10 LAB — CORTISOL: CORTISOL PLASMA: 15.6 ug/dL

## 2015-06-10 LAB — APTT: aPTT: 50 seconds — ABNORMAL HIGH (ref 24–37)

## 2015-06-10 LAB — SEDIMENTATION RATE: Sed Rate: 77 mm/hr — ABNORMAL HIGH (ref 0–22)

## 2015-06-10 MED ORDER — HYDROCORTISONE NA SUCCINATE PF 100 MG IJ SOLR
100.0000 mg | Freq: Once | INTRAMUSCULAR | Status: AC
  Administered 2015-06-10: 100 mg via INTRAVENOUS
  Filled 2015-06-10: qty 2

## 2015-06-10 MED ORDER — SODIUM CHLORIDE 0.9 % IV BOLUS (SEPSIS)
500.0000 mL | Freq: Once | INTRAVENOUS | Status: AC
  Administered 2015-06-10: 500 mL via INTRAVENOUS

## 2015-06-10 MED ORDER — HYDROCORTISONE NA SUCCINATE PF 100 MG IJ SOLR
50.0000 mg | Freq: Four times a day (QID) | INTRAMUSCULAR | Status: DC
  Administered 2015-06-11 – 2015-06-12 (×7): 50 mg via INTRAVENOUS
  Filled 2015-06-10 (×7): qty 2

## 2015-06-10 MED ORDER — PIPERACILLIN-TAZOBACTAM 3.375 G IVPB 30 MIN
3.3750 g | Freq: Once | INTRAVENOUS | Status: AC
  Administered 2015-06-10: 3.375 g via INTRAVENOUS
  Filled 2015-06-10: qty 50

## 2015-06-10 MED ORDER — OXYCODONE HCL 5 MG PO TABS
5.0000 mg | ORAL_TABLET | Freq: Once | ORAL | Status: AC
  Administered 2015-06-10: 5 mg via ORAL
  Filled 2015-06-10: qty 1

## 2015-06-10 MED ORDER — SODIUM CHLORIDE 0.9 % IV BOLUS (SEPSIS)
1000.0000 mL | Freq: Once | INTRAVENOUS | Status: AC
  Administered 2015-06-10: 1000 mL via INTRAVENOUS

## 2015-06-10 MED ORDER — VANCOMYCIN HCL IN DEXTROSE 750-5 MG/150ML-% IV SOLN
750.0000 mg | INTRAVENOUS | Status: AC
  Administered 2015-06-11 – 2015-06-19 (×9): 750 mg via INTRAVENOUS
  Filled 2015-06-10 (×9): qty 150

## 2015-06-10 MED ORDER — VANCOMYCIN HCL IN DEXTROSE 1-5 GM/200ML-% IV SOLN
1000.0000 mg | Freq: Once | INTRAVENOUS | Status: AC
  Administered 2015-06-10: 1000 mg via INTRAVENOUS
  Filled 2015-06-10: qty 200

## 2015-06-10 MED ORDER — PIPERACILLIN-TAZOBACTAM 3.375 G IVPB
3.3750 g | Freq: Three times a day (TID) | INTRAVENOUS | Status: AC
  Administered 2015-06-10 – 2015-06-19 (×27): 3.375 g via INTRAVENOUS
  Filled 2015-06-10 (×27): qty 50

## 2015-06-10 NOTE — ED Notes (Signed)
Attempt to in and out patient unsuccessful; will reattempt at later time.

## 2015-06-10 NOTE — ED Notes (Signed)
Writer notified pt that urine is needed and an In and Out cath is required, pt states Lequita Halt Banker) already obtained urine, pt refuses to be cath a third time.  Environmental manager

## 2015-06-10 NOTE — ED Notes (Signed)
Patient is from [facility].  Patient states she has been having weakness and fever since 06-05-15.  Swollen left leg with weeping, this wound is chronic; however, it has never been this large.  History of Afib  CBG:176  HR:128 afib O2: 98 on 2 L but was 90 without oxygen BP: 101/58 after 500 ml of NS R:18

## 2015-06-10 NOTE — Clinical Social Work Note (Signed)
Clinical Social Work Assessment  Patient Details  Name: Kristina Diaz MRN: [id number] Date of Birth: [birth year]  Date of referral:  06/10/15               Reason for consult:   (Patient is from [facility].)                Permission sought to share information with:   (None.) Permission granted to share information::  No  Name::        Agency::     Relationship::     Contact Information:     Housing/Transportation Living arrangements for the past 2 months:  [place] (Patient informed CSW that she has been a resident at [facility] since 2014.) Source of Information:  Patient Patient Interpreter Needed:  None Criminal Activity/Legal Involvement Pertinent to Current Situation/Hospitalization:  No - Comment as needed Significant Relationships:   (Patient informed CSW that she feels as though she has a good support.) Lives with:  Facility Resident Do you feel safe going back to the place where you live?  Yes Need for family participation in patient care:  Yes (Comment) (Patient states she has a good support system.)  Care giving concerns:  There are no care giving concerns at this time. Patient is from [facility].   Social Worker assessment / plan:  CSW met with patient at bedside. There was no family present. Patient confirms that she is from [facility]. Per note, patient presents with fever.  Patient states she lives in the assisted living unit. Patient denies falling often. Patient states that she has been walking. However, she states that she has not been able to walk well since she fell. Patient states she does not remember the exact date she fell. Patient states that she may have fallen last Thursday.   Patient informed CSW that she has been receiving PT while at the facility.  Patient states she has a good support.  Employment status:  Retired Forensic scientist:  Medicare PT Recommendations:  Not assessed at this time Information /  Referral to community resources:   (No community resources given at this time. Patient is from facility.)  Patient/Family's Response to care:  Patient is appropriate at this time. Patient is aware she is being admitted and is accepting.  Patient/Family's Understanding of and Emotional Response to Diagnosis, Current Treatment, and Prognosis:  Patient has no questions for CSW.  Emotional Assessment Appearance:  Appears stated age Attitude/Demeanor/Rapport:   (Appropropriate.) Affect (typically observed):  Appropriate Orientation:  Oriented to Self, Oriented to Place, Oriented to  Time, Oriented to Situation Alcohol / Substance use:  Not Applicable Psych involvement (Current and /or in the community):  No (Comment)  Discharge Needs  Concerns to be addressed:  Adjustment to Illness Readmission within the last 30 days:  No Current discharge risk:  None Barriers to Discharge:  No Barriers Identified   [name], LCSW 06/10/2015, 5:20 PM

## 2015-06-10 NOTE — ED Notes (Signed)
Attempted to an In & Out catheter with Tiffany but the patient would not relax or lay flat. Will attempt later once pain medication has kicked in

## 2015-06-10 NOTE — Telephone Encounter (Signed)
FYI

## 2015-06-10 NOTE — Progress Notes (Signed)
Pharmacy Antibiotic Note  Laverne Hursey is a 82 y.o. female admitted on 06/10/2015 with weakness and fever since 2/8.    Swollen left leg with weeping, this wound is chronic; however, it has never been this large.  Pharmacy has been consulted for vancomycin and zosyn for sepsis.  Plan: -Zosyn 3.375mg  IV over 30 minutes in ED then 3.375mg  IV q8h extended infution. -vancomycin 1gm IV x 1 in ED then  IV q24h -follow renal function, cultures, clinical course  Height:  (162.6 cm) Weight: 124 lb (56.246 kg) IBW/kg (Calculated) : 54.7  Temp (24hrs), Avg:98.5 F (36.9 C), Min:98.2 F (36.8 C), Max:98.8 F (37.1 C)   Recent Labs Lab 06/10/15 1421 06/10/15 1432  WBC 9.1  --   LATICACIDVEN  --  1.93    Estimated Creatinine Clearance: 36.1 mL/min (by C-G formula based on Cr of 1.02).    Allergies  Allergen Reactions  . Bactrim Ds [Sulfamethoxazole-Trimethoprim] Rash  . Contrast Media [Iodinated Diagnostic Agents] Hives    BLE erythema   . Ciprofloxacin Rash  . Tape Rash    Antimicrobials this admission: 2/13 vanc >> 2/13 zosyn >>  Dose adjustments this admission: n/a  Microbiology results: 2/13 BCx: 2/13 UCx:   Thank you for allowing pharmacy to be a part of this patient's care.  Arley Phenix RPh 06/10/2015, 2:57 PM Pager [phone]

## 2015-06-10 NOTE — H&P (Addendum)
[facility] History and Physical  Ruqaya Strauss UPJ:[id number] DOB: [birth year] DOA: 06/10/2015  Referring physician:  Theotis Burrow PCP:  Nyoka Cowden, MD   Chief Complaint:  Fever, weakness, leg pain  HPI:  The patient is a 82 y.o. year-old female with history of atrial fibrillation, PVD, heart failure, LLE DVT, chronic left leg wound who presents with fever and weakness.  The patient has a history of nonhealing ulcers of the left lower extremity and underwent left popliteal and tibial revascularization with PTA of the popliteal, anterior tibial, and peroneal arteries by Dr. Earleen Newport on 05/09/2015. He used ultrasound-guided antegrade left SFA access an ultrasound-guided DP access of the foot. At the end of the case, the patient had a palpable pulse according to notes.  Postprocedure, she had a fall and was found to have a hematoma at the left SFA access which resulted in an drop in her hemoglobin. She was initially on Plavix, however, she was changed to full dose aspirin which she was supposed to take for 30 days. She was also restarted on her Coumadin.    She was seen by Dr. Earleen Newport in clinic on 2/9 at which time she had a repeat ABI done of the left extremity which was 0.59.  Dr. Earleen Newport felt that she had significant venous occlusive disease and he ordered a left lower extremity duplex the results of which I cannot see in epic.  She has a distant history of LLE DVT and just had a venous duplex on 05/10/2015 which was negative for DVT.    On 2/11, she had a fall and was in the emergency department but discharged back to her senior living facility.  This morning, she was incidentally noted to have a fever to 101 Fahrenheit. She reported weakness and increased odor and drainage of the left leg.  She denied increased erythema, swelling, or pain of the extremity. She denied other complaints such as nausea, vomiting, diarrhea, shortness of breath, cough, dysuria.  In the emergency department, she  was mildly tachycardic to the 110s, she was afebrile but she may given Tylenol prior to transfer from her facility. She was hypotensive to 75/48 although her lactic acid was 1.93. Oxygen saturations were low 90s and she was placed on a liter of nasal cannula.  Her labs were notable for mild acute kidney injury with creatinine of 1.78, baseline of 1. Her white blood cell count was normal, and her hemoglobin was similar to previous checks in January.  X-ray of the left lower extremity demonstrated no evidence of osteomyelitis or subcutaneous gas.  I have ordered a sedimentation rate, CRP, and INR which are pending. Chest x-ray was negative for acute infiltrate and urinalysis is pending.  Review of Systems:  General:  Denies fevers, chills, weight loss or gain HEENT:  Denies changes to hearing and vision, rhinorrhea, sinus congestion, sore throat CV:  Denies chest pain and palpitations, chronic left lower extremity edema, similar to prior PULM:  Denies SOB, wheezing, cough.   GI:  Denies nausea, vomiting, constipation, diarrhea.   GU:  Denies dysuria, frequency, urgency ENDO:  Denies polyuria, polydipsia.   HEME:  Denies hematemesis, blood in stools, melena, abnormal bruising or bleeding.  LYMPH:  Denies lymphadenopathy.   MSK:  Denies arthralgias, myalgias.   DERM: Chronic ulcers of the left lower extremity  NEURO:  Denies focal numbness, weakness, slurred speech, confusion, facial droop.  PSYCH:  Denies anxiety and depression.    Past Medical History  Diagnosis Date  . PAD (  peripheral artery disease) ([place]) 2013    left leg. sounds like she had angio-plasty   . Atrial fibrillation ([place]) 2013    s/p [facility] that failed. ON medication  . Hypertension   . Osteoporosis   . DVT (deep venous thrombosis) ([place]) 2013    "LLE" (08/04/2012)  . Hypothyroidism   . History of blood transfusion 2013    "related to thrombectomy" (08/04/2012)  . Anemia     "slightly" (08/04/2012)  . Arthritis     "probably  minor; knees, pinky" (08/04/2012)  . Fracture of L4 vertebra ([place])     "dx'd in 2013; can't treat til legs fixed" (08/04/2012)  . Lower extremity ulceration ([place])   . Biventricular heart failure with reduced left ventricular function [facility])    Past Surgical History  Procedure Laterality Date  . Thrombectomy Left 2013    "leg" (08/04/2012)  . G2p2    . Tonsillectomy and adenoidectomy  ~ 1938  . Dilation and curettage of uterus  1960's   Social History:  reports that she has never smoked. She has never used smokeless tobacco. She reports that she drinks alcohol. She reports that she does not use illicit drugs. From [facility]  Allergies  Allergen Reactions  . Bactrim Ds [Sulfamethoxazole-Trimethoprim] Rash  . Contrast Media [Iodinated Diagnostic Agents] Hives    BLE erythema   . Ciprofloxacin Rash  . Tape Rash    Family History  Problem Relation Age of Onset  . Arthritis Mother   . Cancer Mother     breast cancer   . Cancer Brother     lung cancer  . Tuberculosis Father   . Cancer Sister     stg 4 breast cancer  . Diabetes Neg Hx   . Heart disease Neg Hx   . COPD Neg Hx      Prior to Admission medications   Medication Sig Start Date End Date Taking? Authorizing Provider  Amino Acids-Protein Hydrolys (FEEDING SUPPLEMENT, PRO-STAT SUGAR FREE 64,) LIQD Take 30 mLs by mouth 2 (two) times daily.   Yes Historical Provider, MD  aspirin EC 81 MG EC tablet Take 1 tablet (81 mg total) by mouth daily. 05/13/15  Yes Maryann Mikhail, DO  docusate sodium (COLACE) 100 MG capsule Take 100 mg by mouth daily.   Yes Historical Provider, MD  levothyroxine (SYNTHROID, LEVOTHROID) 25 MCG tablet TAKE 1 AND 1/2 TABS (37.5MG) BY MOUTH EVERY DAY*CHECK PULSE WEEKLY* 07/14/12  Yes Neena Rhymes, MD  metoprolol succinate (TOPROL-XL) 25 MG 24 hr tablet Take 0.5 tablets (12.5 mg total) by mouth daily. 11/01/12  Yes Neena Rhymes, MD  Multiple Vitamin (MULTIVITAMIN WITH MINERALS) TABS Take 1  tablet by mouth every morning.   Yes Historical Provider, MD  oxyCODONE (OXY IR/ROXICODONE) 5 MG immediate release tablet Take 5 mg by mouth every 6 (six) hours as needed for moderate pain or severe pain.   Yes Historical Provider, MD  oxyCODONE (OXYCONTIN) 10 mg 12 hr tablet Take 1 tablet (10 mg total) by mouth every 12 (twelve) hours. 05/28/15  Yes Marletta Lor, MD  potassium chloride (K-DUR,KLOR-CON) 10 MEQ tablet Take 1 tablet (10 mEq total) by mouth 2 (two) times daily. 08/11/12  Yes Wynetta Emery, PA-C  torsemide (DEMADEX) 20 MG tablet TAKE 1 TAB BY MOUTH EVERY DAY -- DX: EDEMA AND TAKE 1 TAB BY MOUTH AT 5PM ON MON WED FRI (THIS IS.IN ADDITION TO MORNING SCHEDULE DOSE) -... Patient taking differently: TAKE 20 MG BY  MOUTH EVERY DAY -- DX: EDEMA AND TAKE 1 TAB BY MOUTH AT 5PM ON MON WED FRI (THIS IS.IN ADDITION TO MORNING SCHEDULE DOSE) -... 04/24/14  Yes Larey Dresser, MD  torsemide [facility]) 20 MG tablet Take 20 mg by mouth every Monday, Wednesday, and Friday. Reported on 06/10/2015   Yes Historical Provider, MD  UNABLE TO FIND Take 118 mLs by mouth 2 (two) times daily. Med Name: Mighty Shakes 12 noon and 5 pm   Yes Historical Provider, MD  cetirizine (ZYRTEC) 10 MG tablet Take 1 tablet (10 mg total) by mouth 2 (two) times daily. X 14 days Patient not taking: Reported on 06/06/2015 05/01/15   Laurey Morale, MD  warfarin (COUMADIN) 3 MG tablet Take 4.103m (one and a half pills) everyday except Tuesday and Friday. Take 362m(one pill) on Tuesday and Friday. Patient not taking: Reported on 06/08/2015 05/14/15   MaCristal FordDO   Physical Exam: Filed Vitals:   06/10/15 1557 06/10/15 1714 06/10/15 1821 06/10/15 1822  BP: 91/54 99/60 81/49  82/46  Pulse: 88 90 90   Temp:  97.5 F (36.4 C)    TempSrc:  Oral    Resp: 18 14 18    Height:      Weight:      SpO2: 98% 100% 100%      General:  Cachectic adult female, no acute distress, severe kyphosis  Eyes:  PERRL, anicteric,  non-injected., Nasal cannula in place  ENT:  Nares clear.  OP clear, non-erythematous without plaques or exudates.  MMM.  Neck:  Supple without TM or JVD.    Lymph:  No cervical, supraclavicular, or submandibular LAD.  Cardiovascular:  IRRR, normal S1, S2, without m/r/g.  2+ pulses, warm extremities  Respiratory:  CTA bilaterally without increased WOB.  Abdomen:  NABS.  Soft, ND/NT.    Skin:   Follow odor to the left leg. She has delayed capillary refill and a purplish red discoloration of her left extremity from several centimeters below the knee to the foot with 1+ pitting edema. She has no palpable pedal pulse or posterior tibial pulse.  She has two ulcers about 2cm in diameter each on the lower medial and anterior aspect of the left leg with silver dressing covering.  She has a much larger ulcer on the left lateral aspect of her leg.  Entire area is darkly erythematous.    Musculoskeletal:  Normal bulk and tone.  1+ Left LE edema.  Psychiatric:  A & O x 4.  Appropriate affect.  Neurologic:  Grossly moves all extremities  Labs on Admission:  Basic Metabolic Panel:  Recent Labs Lab 06/10/15 1421  NA 143  K 4.6  CL 107  CO2 26  GLUCOSE 127*  BUN 50*  CREATININE 1.78*  CALCIUM 8.6*   Liver Function Tests:  Recent Labs Lab 06/10/15 1421  AST 37  ALT 22  ALKPHOS 70  BILITOT 1.3*  PROT 7.2  ALBUMIN 2.8*   No results for input(s): LIPASE, AMYLASE in the last 168 hours. No results for input(s): AMMONIA in the last 168 hours. CBC:  Recent Labs Lab 06/10/15 1421  WBC 9.1  NEUTROABS 6.6  HGB 9.4*  HCT 30.2*  MCV 94.1  PLT 176   Cardiac Enzymes: No results for input(s): CKTOTAL, CKMB, CKMBINDEX, TROPONINI in the last 168 hours.  BNP (last 3 results) No results for input(s): BNP in the last 8760 hours.  ProBNP (last 3 results) No results for input(s): PROBNP in the last  8760 hours.  CBG: No results for input(s): GLUCAP in the last 168  hours.  Radiological Exams on Admission: Dg Chest 2 View  06/10/2015  CLINICAL DATA:  Fever, atrial fibrillation EXAM: CHEST  2 VIEW COMPARISON:  05/10/2015 FINDINGS: Cardiomegaly again noted. No acute infiltrate or pleural effusion. No pulmonary edema. The left lung apex is obscured by overlying patient's chain. Osteopenia and degenerative changes thoracic spine. Again noted old fracture deformity of L2 vertebral body. IMPRESSION: No active disease. Osteopenia and degenerative changes thoracic spine. Stable chronic fracture deformity of L2 vertebral body. Electronically Signed   By: Lahoma Crocker M.D.   On: 06/10/2015 14:02   Dg Tibia/fibula Left  06/10/2015  CLINICAL DATA:  Left lower extremity nonhealing wound, peripheral vascular disease EXAM: LEFT TIBIA AND FIBULA - 2 VIEW COMPARISON:  05/09/2015 FINDINGS: Bones are osteopenic. Normal alignment. No acute osseous finding or fracture. Peripheral atherosclerosis evident. IMPRESSION: No acute osseous finding. Osteopenia Peripheral atherosclerosis Electronically Signed   By: Jerilynn Mages.  Shick M.D.   On: 06/10/2015 15:40   Ct Lumbar Spine Wo Contrast  06/09/2015  CLINICAL DATA:  82 year old female with lower back pain evaluate for lumbar spine fractures. EXAM: CT LUMBAR SPINE WITHOUT CONTRAST TECHNIQUE: Multidetector CT imaging of the lumbar spine was performed without intravenous contrast administration. Multiplanar CT image reconstructions were also generated. COMPARISON:  CT and radiographs dated 05/10/2015 FINDINGS: There is compression fracture of the L2 vertebra with near complete loss of vertebral body height and anterior wedging similar prior study. There is approximately 4 mm retropulsion of the superior posterior cortex of the L2 with mild focal narrowing of the central canal similar to prior study. There is resulting kyphosis of the spine at L2. No new fracture identified. The bones are osteopenic. Multilevel disc desiccation with vacuum phenomenon noted.  There is grade 1 L5-S1 anterolisthesis. Constipation. No definite evidence of bowel obstruction. A 1.5 cm ill-defined hypodensity in the region of the head of the pancreas. IMPRESSION: L2 compression fracture, similar prior study with near complete loss of vertebral body height and anterior wedging. No new fracture identified. Electronically Signed   By: Anner Crete M.D.   On: 06/09/2015 00:43    EKG: pending  Assessment/Plan Active Problems:   PAD (peripheral artery disease) (HCC)   Atrial fibrillation (HCC)   Hypothyroidism   Long term (current) use of anticoagulants   Sepsis ([place])   Leg ulcer, left (HCC)   Chronic systolic CHF (congestive heart failure) (HCC)   Cellulitis of left lower extremity   Critical lower limb ischemia   Hypotension   AKI (acute kidney injury) (HCC)  ---  Severe sepsis (fever to 101F at facility, tachycardia), possibly due to left lower extremity infected ulcer with surrounding cellulitis.  Odor smells like gangrene and no palpable pulse.  Known peripheral arterial disease -  F/u blood cultures -  ESR and CRP pending -  Continue IVF -  UA pending -  Continue vancomycin and zosyn   Peripheral arterial disease s/p recent angioplasty of LLE 04/2015 by Dr. Earleen Newport -  Continue aspirin -  Heparin gtt if INR subtherapeutic -  IR has been consulted and because there are no stepdown beds available at [facility] and there is a waiting list from their ER, she will be admitted to [facility] stepdown -  Doppler pulse of the left foot -  Repeat arterial and venous duplex of left leg -  I suspect she will need another intervention during this hospitalization -  Note:  Allergy  to contrast media  Chronic atrial fibrillation, mildly tachycardic, but trending down with IVF -  Check INR -  Hold warfarin -  Once INR < 2, start heparin gtt  Chronic diastolic and systolic heart failure, EF 45-50% -  Hold torsemide -  Start judicious IVF -  Daily weights and strict I/O  AKI,  baseline creatinine 1, currently 1.78 -  IVF -  Repeat creatinine in AM  Kyphosis, severe compression fracture of L4  Hypothyroidism, stable, continue sythroid  Diet:  Healthy heart Access:  PIV IVF:  yes Proph:  Warfarin/heparin gtt  Code Status: DNR Family Communication:  Disposition Plan: Admit to stepdown  Time spent: 60 min Janece Canterbury [facility] Pager [phone]  If 7PM-7AM, please contact night-coverage [email or web address] Password [facility] 06/10/2015, 6:27 PM

## 2015-06-10 NOTE — ED Provider Notes (Signed)
CSN: [id number]     Arrival date & time 06/10/15  1230 History   First MD Initiated Contact with Patient 06/10/15 1425     Chief Complaint  Patient presents with  . Fever     (Consider location/radiation/quality/duration/timing/severity/associated sxs/prior Treatment) HPI Comments: 82 year old female with extensive past medical history including atrial fibrillation, PVD, heart failure, chronic left leg wound who presents with fever. This morning at her nursing facility, the patient was evaluated by staff and noted to be febrile to 101. She was also noted to have a low broad pressure and high heart rate. She was brought in by EMS for further evaluation. She has a chronic wound to her left leg for which she receives weekly wound care. She denies any cough, shortness of breath, vomiting, diarrhea, or abdominal pain. She notes that ever since her recent procedure to restore blood flow to her leg, she has had problems with her blood pressure running lower than normal. She denies any recent changes to medications.  Patient is a 82 y.o. female presenting with fever. The history is provided by the patient.  Fever   Past Medical History  Diagnosis Date  . PAD (peripheral artery disease) (HCC) 2013    left leg. sounds like she had angio-plasty   . Atrial fibrillation (HCC) 2013    s/p [facility] that failed. ON medication  . Hypertension   . Osteoporosis   . DVT (deep venous thrombosis) (HCC) 2013    "LLE" (08/04/2012)  . Hypothyroidism   . History of blood transfusion 2013    "related to thrombectomy" (08/04/2012)  . Anemia     "slightly" (08/04/2012)  . Arthritis     "probably minor; knees, pinky" (08/04/2012)  . Fracture of L4 vertebra (HCC)     "dx'd in 2013; can't treat til legs fixed" (08/04/2012)  . Lower extremity ulceration (HCC)   . Biventricular heart failure with reduced left ventricular function [facility])    Past Surgical History  Procedure Laterality Date  . Thrombectomy Left 2013     "leg" (08/04/2012)  . G2p2    . Tonsillectomy and adenoidectomy  ~ 1938  . Dilation and curettage of uterus  1960's   Family History  Problem Relation Age of Onset  . Arthritis Mother   . Cancer Mother     breast cancer   . Cancer Brother     lung cancer  . Tuberculosis Father   . Cancer Sister     stg 4 breast cancer  . Diabetes Neg Hx   . Heart disease Neg Hx   . COPD Neg Hx    Social History  Substance Use Topics  . Smoking status: Never Smoker   . Smokeless tobacco: Never Used  . Alcohol Use: 0.0 oz/week     Comment: 08/04/2012 "used to have 1-2 glasses of wine/wk; nothing for the past 1 yr"   OB History    No data available     Review of Systems  Constitutional: Positive for fever.   10 Systems reviewed and are negative for acute change except as noted in the HPI.    Allergies  Bactrim ds; Contrast media; Ciprofloxacin; and Tape  Home Medications   Prior to Admission medications   Medication Sig Start Date End Date Taking? Authorizing Provider  Amino Acids-Protein Hydrolys (FEEDING SUPPLEMENT, PRO-STAT SUGAR FREE 64,) LIQD Take 30 mLs by mouth 2 (two) times daily.   Yes Historical Provider, MD  aspirin EC 81 MG EC tablet Take 1 tablet (81  mg total) by mouth daily. 05/13/15  Yes Maryann Mikhail, DO  docusate sodium (COLACE) 100 MG capsule Take 100 mg by mouth daily.   Yes Historical Provider, MD  levothyroxine (SYNTHROID, LEVOTHROID) 25 MCG tablet TAKE 1 AND 1/2 TABS (37.5MG ) BY MOUTH EVERY DAY*CHECK PULSE WEEKLY* 07/14/12  Yes Jacques Navy, MD  metoprolol succinate (TOPROL-XL) 25 MG 24 hr tablet Take 0.5 tablets (12.5 mg total) by mouth daily. 11/01/12  Yes Jacques Navy, MD  Multiple Vitamin (MULTIVITAMIN WITH MINERALS) TABS Take 1 tablet by mouth every morning.   Yes Historical Provider, MD  oxyCODONE (OXY IR/ROXICODONE) 5 MG immediate release tablet Take 5 mg by mouth every 6 (six) hours as needed for moderate pain or severe pain.   Yes Historical Provider,  MD  oxyCODONE (OXYCONTIN) 10 mg 12 hr tablet Take 1 tablet (10 mg total) by mouth every 12 (twelve) hours. 05/28/15  Yes Gordy Savers, MD  potassium chloride (K-DUR,KLOR-CON) 10 MEQ tablet Take 1 tablet (10 mEq total) by mouth 2 (two) times daily. 08/11/12  Yes Hadassah Pais, PA-C  torsemide (DEMADEX) 20 MG tablet TAKE 1 TAB BY MOUTH EVERY DAY -- DX: EDEMA AND TAKE 1 TAB BY MOUTH AT 5PM ON MON WED FRI (THIS IS.IN ADDITION TO MORNING SCHEDULE DOSE) -... Patient taking differently: TAKE 20 MG BY MOUTH EVERY DAY -- DX: EDEMA AND TAKE 1 TAB BY MOUTH AT 5PM ON MON WED FRI (THIS IS.IN ADDITION TO MORNING SCHEDULE DOSE) -... 04/24/14  Yes Laurey Morale, MD  torsemide [facility]) 20 MG tablet Take 20 mg by mouth every Monday, Wednesday, and Friday. Reported on 06/10/2015   Yes Historical Provider, MD  UNABLE TO FIND Take 118 mLs by mouth 2 (two) times daily. Med Name: Mighty Shakes 12 noon and 5 pm   Yes Historical Provider, MD  cetirizine (ZYRTEC) 10 MG tablet Take 1 tablet (10 mg total) by mouth 2 (two) times daily. X 14 days Patient not taking: Reported on 06/06/2015 05/01/15   Nelwyn Salisbury, MD  warfarin (COUMADIN) 3 MG tablet Take 4.5mg  (one and a half pills) everyday except Tuesday and Friday. Take  (one pill) on Tuesday and Friday. Patient not taking: Reported on 06/08/2015 05/14/15   Maryann Mikhail, DO   BP 93/63 mmHg  Pulse 103  Temp(Src) 98.8 F (37.1 C) (Rectal)  Resp 20  Ht  (1.626 m)  Wt 124 lb (56.246 kg)  BMI 21.27 kg/m2  SpO2 95% Physical Exam  Constitutional: She is oriented to person, place, and time. No distress.  Thin, frail elderly woman  HENT:  Head: Normocephalic and atraumatic.  Moist mucous membranes  Eyes: Conjunctivae are normal. Pupils are equal, round, and reactive to light.  Neck: Neck supple.  Cardiovascular: Normal heart sounds.  An irregularly irregular rhythm present. Tachycardia present.   No murmur heard. Pulmonary/Chest: Effort normal. She has no  wheezes.  Diminished BS b/l  Abdominal: Soft. Bowel sounds are normal. She exhibits no distension. There is no tenderness.  Musculoskeletal:  Edema L lower leg, 2+ pedal pulses  Neurological: She is alert and oriented to person, place, and time.  Fluent speech  Skin: Skin is warm.  Large deep ulcer involving medial, lateral, and posterior L lower leg with foul-smelling, serous drainage and mild erythema of left foot  Psychiatric: She has a normal mood and affect. Judgment normal.  Nursing note and vitals reviewed.   ED Course  .Critical Care Performed by: Laurence Spates Authorized by:  Keylen Eckenrode MORGAN Total critical care time: 35 minutes Critical care was necessary to treat or prevent imminent or life-threatening deterioration of the following conditions: sepsis. Critical care was time spent personally by me on the following activities: development of treatment plan with patient or surrogate, evaluation of patient's response to treatment, examination of patient, obtaining history from patient or surrogate, ordering and performing treatments and interventions, ordering and review of laboratory studies, ordering and review of radiographic studies, re-evaluation of patient's condition and review of old charts.   (including critical care time) Labs Review Labs Reviewed  CBC WITH DIFFERENTIAL/PLATELET - Abnormal; Notable for the following:    RBC 3.21 (*)    Hemoglobin 9.4 (*)    HCT 30.2 (*)    RDW 18.3 (*)    Monocytes Absolute 1.6 (*)    All other components within normal limits  CULTURE, BLOOD (ROUTINE X 2)  CULTURE, BLOOD (ROUTINE X 2)  URINE CULTURE  COMPREHENSIVE METABOLIC PANEL  URINALYSIS, ROUTINE W REFLEX MICROSCOPIC (NOT AT [facility])  I-STAT CG4 LACTIC ACID, ED    Imaging Review Dg Chest 2 View  06/10/2015  CLINICAL DATA:  Fever, atrial fibrillation EXAM: CHEST  2 VIEW COMPARISON:  05/10/2015 FINDINGS: Cardiomegaly again noted. No acute infiltrate or pleural  effusion. No pulmonary edema. The left lung apex is obscured by overlying patient's chain. Osteopenia and degenerative changes thoracic spine. Again noted old fracture deformity of L2 vertebral body. IMPRESSION: No active disease. Osteopenia and degenerative changes thoracic spine. Stable chronic fracture deformity of L2 vertebral body. Electronically Signed   By: Natasha Mead M.D.   On: 06/10/2015 14:02   Ct Lumbar Spine Wo Contrast  06/09/2015  CLINICAL DATA:  82 year old female with lower back pain evaluate for lumbar spine fractures. EXAM: CT LUMBAR SPINE WITHOUT CONTRAST TECHNIQUE: Multidetector CT imaging of the lumbar spine was performed without intravenous contrast administration. Multiplanar CT image reconstructions were also generated. COMPARISON:  CT and radiographs dated 05/10/2015 FINDINGS: There is compression fracture of the L2 vertebra with near complete loss of vertebral body height and anterior wedging similar prior study. There is approximately 4 mm retropulsion of the superior posterior cortex of the L2 with mild focal narrowing of the central canal similar to prior study. There is resulting kyphosis of the spine at L2. No new fracture identified. The bones are osteopenic. Multilevel disc desiccation with vacuum phenomenon noted. There is grade 1 L5-S1 anterolisthesis. Constipation. No definite evidence of bowel obstruction. A 1.5 cm ill-defined hypodensity in the region of the head of the pancreas. IMPRESSION: L2 compression fracture, similar prior study with near complete loss of vertebral body height and anterior wedging. No new fracture identified. Electronically Signed   By: Elgie Collard M.D.   On: 06/09/2015 00:43   I have personally reviewed and evaluated these lab results as part of my medical decision-making.   EKG Interpretation None     Medications  sodium chloride 0.9 % bolus 500 mL (not administered)  piperacillin-tazobactam (ZOSYN) IVPB 3.375 g (not administered)   vancomycin (VANCOCIN) IVPB 1000 mg/200 mL premix (not administered)  sodium chloride 0.9 % bolus 1,000 mL (1,000 mLs Intravenous New Bag/Given 06/10/15 1430)    MDM   Final diagnoses:  Sepsis, due to unspecified organism (HCC)  Leg wound, left, sequela  Acute kidney injury (HCC)   Pt p/w fever and abnormal vital signs noted at her assisted living facility this morning. On arrival, the patient was awake, alert, thin and chronically ill-appearing but in no  acute distress. She was mentating appropriately. Temp was 98.8 rectally but patient had received Tylenol in transport. Heart rate near 110, atrial fibrillation. Hypotension of 70s to 80s systolic. Patient was 90% on room air and was placed on 2 L nasal cannula. Normal work of breathing and no abnormal lung sounds on exam. She had a large left lower leg wound with foul-smelling serous drainage and mild surrounding erythema. She was neurovascularly intact distally. Because of abnormal vital signs and reports of fever, initiated a code sepsis with blood and urine cultures, IV fluids, and vancomycin, Zosyn. Initial lactate normal. Chest x-ray without acute process. Hgb 9.4, consistent w/ baseline. Creatinine elevated 1.78. Tip/fib x-ray negative for osteomyelitis. I discussed admission with [facility] hospitalist, Dr. Malachi Bonds, and patient admitted for further care.    Laurence Spates, MD 06/10/15 [phone]

## 2015-06-10 NOTE — ED Notes (Signed)
Report received from Morgan, RN 

## 2015-06-10 NOTE — ED Notes (Signed)
Pt repositioned in bed upon request.

## 2015-06-10 NOTE — Progress Notes (Signed)
ANTICOAGULATION CONSULT NOTE - Initial Consult  Pharmacy Consult for Heparin Indication: atrial fibrillation and DVT  Allergies  Allergen Reactions  . Bactrim Ds [Sulfamethoxazole-Trimethoprim] Rash  . Contrast Media [Iodinated Diagnostic Agents] Hives    BLE erythema   . Ciprofloxacin Rash  . Tape Rash    Patient Measurements: Height:  (162.6 cm) Weight: 124 lb (56.246 kg) IBW/kg (Calculated) : 54.7 Heparin Dosing Weight: actual weight  Vital Signs: Temp: 97.5 F (36.4 C) (02/13 1714) Temp Source: Oral (02/13 1714) BP: 91/45 mmHg (02/13 2021) Pulse Rate: 69 (02/13 2021)  Labs:  Recent Labs  06/10/15 1421 06/10/15 1812  HGB 9.4*  --   HCT 30.2*  --   PLT 176  --   APTT  --  50*  LABPROT  --  45.6*  INR  --  5.10*  CREATININE 1.78*  --     Estimated Creatinine Clearance: 20.7 mL/min (by C-G formula based on Cr of 1.78).   Medical History: Past Medical History  Diagnosis Date  . PAD (peripheral artery disease) (HCC) 2013    left leg. sounds like she had angio-plasty   . Atrial fibrillation (HCC) 2013    s/p [facility] that failed. ON medication  . Hypertension   . Osteoporosis   . DVT (deep venous thrombosis) (HCC) 2013    "LLE" (08/04/2012)  . Hypothyroidism   . History of blood transfusion 2013    "related to thrombectomy" (08/04/2012)  . Anemia     "slightly" (08/04/2012)  . Arthritis     "probably minor; knees, pinky" (08/04/2012)  . Fracture of L4 vertebra (HCC)     "dx'd in 2013; can't treat til legs fixed" (08/04/2012)  . Lower extremity ulceration (HCC)   . Biventricular heart failure with reduced left ventricular function (HCC)     Medications:  Scheduled:  . [START ON 06/11/2015] hydrocortisone sod succinate (SOLU-CORTEF) inj  50 mg Intravenous Q6H  . [START ON 06/11/2015] vancomycin  750 mg Intravenous Q24H   Infusions:  . piperacillin-tazobactam (ZOSYN)  IV      Assessment: 9 yoF admitted on 2/13 with fever, weakness, and sepsis.  PMH  includes chronic warfarin anticoagulation for afib and DVT.  INR 5.1 on admission is supratherapeutic and warfarin is held.  Pharmacy is consulted to begin Heparin IV when INR < 2.  Today, 06/10/2015:  INR 5.1  CBC: Hgb 9.4, Plt 176  Goal of Therapy:  Heparin level 0.3-0.7 units/ml Monitor platelets by anticoagulation protocol: Yes   Plan:   Recheck daily INR and CBC  Pharmacy to begin Heparin IV when INR < 2.  Lynann Beaver PharmD, BCPS Pager [phone] 06/10/2015 8:48 PM

## 2015-06-10 NOTE — ED Notes (Signed)
Kristina Diaz reports gold drawn with intial labels to save; should have two golds in labs.

## 2015-06-10 NOTE — ED Notes (Signed)
Additional admission blood draw added; Josph Macho reports unsuccessful lab draw.

## 2015-06-10 NOTE — ED Notes (Signed)
Nanavati MD notified of pt status/complaint.

## 2015-06-10 NOTE — Telephone Encounter (Signed)
FYI angela is calling to let md know that the pt was sent to [place] for elevated heart rate 137 and temp 100.1. Pt was falling a sleep in middle of conversation. Pt had two  Falls one on Friday and one on saturday

## 2015-06-11 ENCOUNTER — Inpatient Hospital Stay (HOSPITAL_COMMUNITY)
Admit: 2015-06-11 | Discharge: 2015-06-11 | Disposition: A | Discharge status: Home / Self Care | Payer: Medicare Other | Attending: Radiology | Admitting: Radiology

## 2015-06-11 DIAGNOSIS — S81802A Unspecified open wound, left lower leg, initial encounter: Secondary | ICD-10-CM

## 2015-06-11 DIAGNOSIS — L97929 Non-pressure chronic ulcer of unspecified part of left lower leg with unspecified severity: Secondary | ICD-10-CM

## 2015-06-11 LAB — C-REACTIVE PROTEIN: CRP: 16.1 mg/dL — ABNORMAL HIGH (ref <1.0)

## 2015-06-11 LAB — URINALYSIS, ROUTINE W REFLEX MICROSCOPIC
BILIRUBIN URINE: NEGATIVE
GLUCOSE, UA: NEGATIVE mg/dL
Hgb urine dipstick: NEGATIVE
KETONES UR: NEGATIVE mg/dL
Leukocytes, UA: NEGATIVE
Nitrite: NEGATIVE
PH: 5 (ref 5.0–8.0)
Protein, ur: NEGATIVE mg/dL
SPECIFIC GRAVITY, URINE: 1.017 (ref 1.005–1.030)

## 2015-06-11 LAB — CBC
HCT: 31.6 % — ABNORMAL LOW (ref 36.0–46.0)
Hemoglobin: 9.6 g/dL — ABNORMAL LOW (ref 12.0–15.0)
MCH: 29.1 pg (ref 26.0–34.0)
MCHC: 30.4 g/dL (ref 30.0–36.0)
MCV: 95.8 fL (ref 78.0–100.0)
PLATELETS: 155 10*3/uL (ref 150–400)
RBC: 3.3 MIL/uL — ABNORMAL LOW (ref 3.87–5.11)
RDW: 18.7 % — AB (ref 11.5–15.5)
WBC: 10 10*3/uL (ref 4.0–10.5)

## 2015-06-11 LAB — BASIC METABOLIC PANEL
ANION GAP: 11 (ref 5–15)
BUN: 41 mg/dL — AB (ref 6–20)
CALCIUM: 8 mg/dL — AB (ref 8.9–10.3)
CO2: 22 mmol/L (ref 22–32)
Chloride: 112 mmol/L — ABNORMAL HIGH (ref 101–111)
Creatinine, Ser: 1.4 mg/dL — ABNORMAL HIGH (ref 0.44–1.00)
GFR calc Af Amer: 39 mL/min — ABNORMAL LOW (ref >60)
GFR, EST NON AFRICAN AMERICAN: 34 mL/min — AB (ref >60)
GLUCOSE: 130 mg/dL — AB (ref 65–99)
Potassium: 4.7 mmol/L (ref 3.5–5.1)
Sodium: 145 mmol/L (ref 135–145)

## 2015-06-11 LAB — PROTIME-INR
INR: 5.8 (ref 0.00–1.49)
Prothrombin Time: 50.2 seconds — ABNORMAL HIGH (ref 11.6–15.2)

## 2015-06-11 MED ORDER — PRO-STAT SUGAR FREE PO LIQD
30.0000 mL | Freq: Two times a day (BID) | ORAL | Status: DC
  Administered 2015-06-11 – 2015-06-12 (×2): 30 mL via ORAL
  Filled 2015-06-11 (×5): qty 30

## 2015-06-11 MED ORDER — OXYCODONE HCL ER 10 MG PO T12A
10.0000 mg | EXTENDED_RELEASE_TABLET | Freq: Two times a day (BID) | ORAL | Status: DC
  Administered 2015-06-11 – 2015-06-20 (×20): 10 mg via ORAL
  Filled 2015-06-11 (×20): qty 1

## 2015-06-11 MED ORDER — DOCUSATE SODIUM 100 MG PO CAPS
100.0000 mg | ORAL_CAPSULE | Freq: Every day | ORAL | Status: DC
  Administered 2015-06-11 – 2015-06-18 (×8): 100 mg via ORAL
  Filled 2015-06-11 (×10): qty 1

## 2015-06-11 MED ORDER — SODIUM CHLORIDE 0.9% FLUSH
3.0000 mL | Freq: Two times a day (BID) | INTRAVENOUS | Status: DC
  Administered 2015-06-11 – 2015-06-19 (×9): 3 mL via INTRAVENOUS

## 2015-06-11 MED ORDER — SODIUM CHLORIDE 0.9 % IV SOLN
INTRAVENOUS | Status: DC
Start: 2015-06-11 — End: 2015-06-11
  Administered 2015-06-11 (×2): via INTRAVENOUS

## 2015-06-11 MED ORDER — METOPROLOL SUCCINATE ER 25 MG PO TB24
12.5000 mg | ORAL_TABLET | Freq: Every day | ORAL | Status: DC
  Administered 2015-06-11 – 2015-06-13 (×3): 12.5 mg via ORAL
  Filled 2015-06-11 (×5): qty 1

## 2015-06-11 MED ORDER — BISACODYL 10 MG RE SUPP
10.0000 mg | Freq: Once | RECTAL | Status: DC
  Filled 2015-06-11: qty 1

## 2015-06-11 MED ORDER — LEVOTHYROXINE SODIUM 75 MCG PO TABS
37.5000 ug | ORAL_TABLET | Freq: Every day | ORAL | Status: DC
  Administered 2015-06-11 – 2015-06-20 (×10): 37.5 ug via ORAL
  Filled 2015-06-11 (×6): qty 1
  Filled 2015-06-11: qty 0.5
  Filled 2015-06-11 (×2): qty 1
  Filled 2015-06-11: qty 0.5
  Filled 2015-06-11: qty 1

## 2015-06-11 MED ORDER — ASPIRIN EC 81 MG PO TBEC
81.0000 mg | DELAYED_RELEASE_TABLET | Freq: Every day | ORAL | Status: DC
  Administered 2015-06-11 – 2015-06-20 (×10): 81 mg via ORAL
  Filled 2015-06-11 (×10): qty 1

## 2015-06-11 MED ORDER — POTASSIUM CHLORIDE CRYS ER 10 MEQ PO TBCR
10.0000 meq | EXTENDED_RELEASE_TABLET | Freq: Two times a day (BID) | ORAL | Status: DC
  Administered 2015-06-11 – 2015-06-12 (×4): 10 meq via ORAL
  Filled 2015-06-11 (×4): qty 1

## 2015-06-11 MED ORDER — OXYCODONE HCL 5 MG PO TABS
5.0000 mg | ORAL_TABLET | ORAL | Status: DC | PRN
  Administered 2015-06-12 – 2015-06-19 (×9): 5 mg via ORAL
  Filled 2015-06-11 (×9): qty 1

## 2015-06-11 MED ORDER — ADULT MULTIVITAMIN W/MINERALS CH
1.0000 | ORAL_TABLET | Freq: Every morning | ORAL | Status: DC
  Administered 2015-06-11 – 2015-06-20 (×9): 1 via ORAL
  Filled 2015-06-11 (×10): qty 1

## 2015-06-11 NOTE — Progress Notes (Signed)
*  Preliminary Results* Bilateral lower extremity venous duplex completed. Study was technically limited due to patient position and edema. Visualized veins of bilateral lower extremities are negative for deep vein thrombosis. Bilateral lower extremity veins exhibit pulsatile flow, suggestive of possible elevated right sided heart pressure. There is no evidence of Baker's cyst bilaterally.  06/11/2015  Gertie Fey, RVT, RDCS, RDMS

## 2015-06-11 NOTE — Progress Notes (Addendum)
ANTICOAGULATION CONSULT NOTE - Initial Consult  Pharmacy Consult for Heparin Indication: atrial fibrillation and DVT  Allergies  Allergen Reactions  . Bactrim Ds [Sulfamethoxazole-Trimethoprim] Rash  . Contrast Media [Iodinated Diagnostic Agents] Hives    BLE erythema   . Ciprofloxacin Rash  . Tape Rash   Patient Measurements: Height:  (162.6 cm) Weight: 124 lb (56.246 kg) IBW/kg (Calculated) : 54.7 Heparin Dosing Weight: actual weight  Vital Signs: BP: 111/84 mmHg (02/14 0701) Pulse Rate: 83 (02/14 0701)  Labs:  Recent Labs  06/10/15 1421 06/10/15 1812 06/11/15 0419  HGB 9.4*  --  9.6*  HCT 30.2*  --  31.6*  PLT 176  --  155  APTT  --  50*  --   LABPROT  --  45.6* 50.2*  INR  --  5.10* 5.80*  CREATININE 1.78*  --  1.40*   Estimated Creatinine Clearance: 26.3 mL/min (by C-G formula based on Cr of 1.4).  Medical History: Past Medical History  Diagnosis Date  . PAD (peripheral artery disease) (HCC) 2013    left leg. sounds like she had angio-plasty   . Atrial fibrillation (HCC) 2013    s/p [facility] that failed. ON medication  . Hypertension   . Osteoporosis   . DVT (deep venous thrombosis) (HCC) 2013    "LLE" (08/04/2012)  . Hypothyroidism   . History of blood transfusion 2013    "related to thrombectomy" (08/04/2012)  . Anemia     "slightly" (08/04/2012)  . Arthritis     "probably minor; knees, pinky" (08/04/2012)  . Fracture of L4 vertebra (HCC)     "dx'd in 2013; can't treat til legs fixed" (08/04/2012)  . Lower extremity ulceration (HCC)   . Biventricular heart failure with reduced left ventricular function (HCC)    Medications:  Scheduled:  . aspirin EC  81 mg Oral Daily  . docusate sodium  100 mg Oral Daily  . feeding supplement (PRO-STAT SUGAR FREE 64)  30 mL Oral BID  . hydrocortisone sod succinate (SOLU-CORTEF) inj  50 mg Intravenous Q6H  . levothyroxine  37.5 mcg Oral QAC breakfast  . metoprolol succinate  12.5 mg Oral Daily  . multivitamin  with minerals  1 tablet Oral q morning - 10a  . oxyCODONE  10 mg Oral Q12H  . potassium chloride  10 mEq Oral BID  . sodium chloride flush  3 mL Intravenous Q12H  . vancomycin  750 mg Intravenous Q24H   Infusions:  . sodium chloride 100 mL/hr at 06/11/15 0205  . piperacillin-tazobactam (ZOSYN)  IV 3.375 g (06/11/15 0610)    Assessment: 43 yoF admitted on 2/13 with fever, weakness, and sepsis.  PMH includes chronic warfarin anticoagulation for afib and DVT.  INR 5.1 on admission is supratherapeutic and warfarin is held.  Pharmacy is consulted to begin Heparin IV when INR < 2.  Today, 06/11/2015:  INR 5.8 (increased)  CBC: Hgb 9.6, Plt 155  Goal of Therapy:  Heparin level 0.3-0.7 units/ml Monitor platelets by anticoagulation protocol: Yes   Plan:   Recheck daily INR and CBC  Pharmacy to begin Heparin IV when INR < 2.  Otho Bellows PharmD Pager [phone] 06/11/2015, 8:18 AM

## 2015-06-11 NOTE — ED Notes (Signed)
Nurse drawing labs. 

## 2015-06-11 NOTE — ED Notes (Signed)
On-Call MD paged regarding critical PT-INR Results. On-Call MD instructed RN to notify "Floor Coverage MD".

## 2015-06-11 NOTE — Consult Note (Signed)
Chief Complaint: I had high heart rate and fever.   Referring Physician(s): Dr. Renae Fickle  History of Present Illness: Kristina Diaz is a 82 y.o. female presenting to [facility] ED, referred from her rehab facility of [facility] for high heart rate and a fever.    She is a woman who has had a long-standing left lower extremity wound, for which she has been receiving care at [facility].    VIR has seen Ms Mcinnis in the clinic regarding her wound, and my impression is that she has both an arterial component and a venous component.  She was treated surgically in [place], [place] for what sounded like embolectomy (potentially AFib-related embolism), and she also has a history of DVT on the left.    We were able to successfully treat a chronic occlusion of the popliteal artery and restore inline flow to the infra-geniculate arteries on 05/09/2015.  She unfortunately was admitted after our angiogram/PTA on the same day for a fall at her rehab facility.  Since then she tells me that she has been doing fine.   She had a scheduled appointment with VIR as a follow-up on 06/06/2015, 5 days ago.  She had no complaints, except leg pain experienced during her dressing changes.  She denies resting pain of the leg.  She is not ambulating.  At that time she denied any fevers, rigors, chills.  She had no signs of bleeding.    She denies any other symptoms.  No chest pain, SOB, pleurisy, symptoms of URI, UTI, or GI upset.  Denies N/V/D.  Denies melena or hematochezia. No gingival bleeding.    Swelling has progressed in her lower extremity since our treatment, with increased weeping at the wound.  She states that often times the dressings become saturated.  Upon inspection today, the distribution is similar to that during the clinic visit on 2/9.  Edema limits palpation of her pedal pulses, though she has a positive doppler signal at the left DP, and she also has a signal at the PT, which is sampled with  adequate gel through the wound bed.  Upon removal of the dressings, she has brisk venous oozing at the margin of the wound of the foot dorsum.  I feel her arterial inflow is probably adequate.      Past Medical History  Diagnosis Date  . PAD (peripheral artery disease) (HCC) 2013    left leg. sounds like she had angio-plasty   . Atrial fibrillation (HCC) 2013    s/p [facility] that failed. ON medication  . Hypertension   . Osteoporosis   . DVT (deep venous thrombosis) (HCC) 2013    "LLE" (08/04/2012)  . Hypothyroidism   . History of blood transfusion 2013    "related to thrombectomy" (08/04/2012)  . Anemia     "slightly" (08/04/2012)  . Arthritis     "probably minor; knees, pinky" (08/04/2012)  . Fracture of L4 vertebra (HCC)     "dx'd in 2013; can't treat til legs fixed" (08/04/2012)  . Lower extremity ulceration (HCC)   . Biventricular heart failure with reduced left ventricular function [facility])     Past Surgical History  Procedure Laterality Date  . Thrombectomy Left 2013    "leg" (08/04/2012)  . G2p2    . Tonsillectomy and adenoidectomy  ~ 1938  . Dilation and curettage of uterus  1960's    Allergies: Bactrim ds; Contrast media; Ciprofloxacin; and Tape  Medications: Prior to Admission medications  Medication Sig Start Date End Date Taking? Authorizing Provider  Amino Acids-Protein Hydrolys (FEEDING SUPPLEMENT, PRO-STAT SUGAR FREE 64,) LIQD Take 30 mLs by mouth 2 (two) times daily.   Yes Historical Provider, MD  aspirin EC 81 MG EC tablet Take 1 tablet (81 mg total) by mouth daily. 05/13/15  Yes Maryann Mikhail, DO  docusate sodium (COLACE) 100 MG capsule Take 100 mg by mouth daily.   Yes Historical Provider, MD  levothyroxine (SYNTHROID, LEVOTHROID) 25 MCG tablet TAKE 1 AND 1/2 TABS (37.5MG ) BY MOUTH EVERY DAY*CHECK PULSE WEEKLY* 07/14/12  Yes Jacques Navy, MD  metoprolol succinate (TOPROL-XL) 25 MG 24 hr tablet Take 0.5 tablets (12.5 mg total) by mouth daily. 11/01/12  Yes Jacques Navy, MD  Multiple Vitamin (MULTIVITAMIN WITH MINERALS) TABS Take 1 tablet by mouth every morning.   Yes Historical Provider, MD  oxyCODONE (OXY IR/ROXICODONE) 5 MG immediate release tablet Take 5 mg by mouth every 6 (six) hours as needed for moderate pain or severe pain.   Yes Historical Provider, MD  oxyCODONE (OXYCONTIN) 10 mg 12 hr tablet Take 1 tablet (10 mg total) by mouth every 12 (twelve) hours. 05/28/15  Yes Gordy Savers, MD  potassium chloride (K-DUR,KLOR-CON) 10 MEQ tablet Take 1 tablet (10 mEq total) by mouth 2 (two) times daily. 08/11/12  Yes Hadassah Pais, PA-C  torsemide (DEMADEX) 20 MG tablet TAKE 1 TAB BY MOUTH EVERY DAY -- DX: EDEMA AND TAKE 1 TAB BY MOUTH AT 5PM ON MON WED FRI (THIS IS.IN ADDITION TO MORNING SCHEDULE DOSE) -... Patient taking differently: TAKE 20 MG BY MOUTH EVERY DAY -- DX: EDEMA AND TAKE 1 TAB BY MOUTH AT 5PM ON MON WED FRI (THIS IS.IN ADDITION TO MORNING SCHEDULE DOSE) -... 04/24/14  Yes Laurey Morale, MD  torsemide [facility]) 20 MG tablet Take 20 mg by mouth every Monday, Wednesday, and Friday. Reported on 06/10/2015   Yes Historical Provider, MD  UNABLE TO FIND Take 118 mLs by mouth 2 (two) times daily. Med Name: Mighty Shakes 12 noon and 5 pm   Yes Historical Provider, MD  warfarin (COUMADIN) 3 MG tablet Take 4.5mg  (one and a half pills) everyday except Tuesday and Friday. Take 3mg  (one pill) on Tuesday and Friday. Patient not taking: Reported on 06/08/2015 05/14/15   Edsel Petrin, DO     Family History  Problem Relation Age of Onset  . Arthritis Mother   . Cancer Mother     breast cancer   . Cancer Brother     lung cancer  . Tuberculosis Father   . Cancer Sister     stg 4 breast cancer  . Diabetes Neg Hx   . Heart disease Neg Hx   . COPD Neg Hx     Social History   Social History  . Marital Status: Married    Spouse Name: N/A  . Number of Children: 2  . Years of Education: 13   Occupational History  . retired    Social  History Main Topics  . Smoking status: Never Smoker   . Smokeless tobacco: Never Used  . Alcohol Use: 0.0 oz/week     Comment: 08/04/2012 "used to have 1-2 glasses of wine/wk; nothing for the past 1 yr"  . Drug Use: No  . Sexual Activity: No   Other Topics Concern  . None   Social History Narrative   HSG. 1 year of college. Married: 1959: 2 sons - '63, '70: 3 grandchildren. Work -  office manager-retired. Have been living with husband in [place]. ACP/Living will - HCPOA both sons. Recommended going to [email or web address].                       Review of Systems: A 12 point ROS discussed and pertinent positives are indicated in the HPI above.  All other systems are negative.  Review of Systems  Vital Signs: BP 111/84 mmHg  Pulse 83  Temp(Src) 97.5 F (36.4 C) (Oral)  Resp 16  Ht 5\' 4"  (1.626 m)  Wt 124 lb (56.246 kg)  BMI 21.27 kg/m2  SpO2 97%  Physical Exam  Swelling has progressed in her lower extremity since our treatment, with increased weeping at the wound.  She states that often times the dressings become saturated.  Upon inspection today, the distribution is similar to that during the clinic visit on 2/9.  Edema limits palpation of her pedal pulses, though she has a positive doppler signal at the left DP, and she also has a signal at the PT, which is sampled with adequate gel through the wound bed.  Upon removal of the dressings, she has brisk venous oozing at the margin of the wound of the foot dorsum.  I feel her arterial inflow is probably adequate.  Wound is circumferential at the left lower extremity, in the gaiter zone, typical of venous distribution.   Ichthyosis of the right lower extremity, typical of chronic venous changes.    Mallampati Score:  2  Imaging: Dg Chest 2 View  06/10/2015  CLINICAL DATA:  Fever, atrial fibrillation EXAM: CHEST  2 VIEW COMPARISON:  05/10/2015 FINDINGS: Cardiomegaly again noted. No acute infiltrate or pleural  effusion. No pulmonary edema. The left lung apex is obscured by overlying patient's chain. Osteopenia and degenerative changes thoracic spine. Again noted old fracture deformity of L2 vertebral body. IMPRESSION: No active disease. Osteopenia and degenerative changes thoracic spine. Stable chronic fracture deformity of L2 vertebral body. Electronically Signed   By: Natasha Mead M.D.   On: 06/10/2015 14:02   Dg Tibia/fibula Left  06/10/2015  CLINICAL DATA:  Left lower extremity nonhealing wound, peripheral vascular disease EXAM: LEFT TIBIA AND FIBULA - 2 VIEW COMPARISON:  05/09/2015 FINDINGS: Bones are osteopenic. Normal alignment. No acute osseous finding or fracture. Peripheral atherosclerosis evident. IMPRESSION: No acute osseous finding. Osteopenia Peripheral atherosclerosis Electronically Signed   By: Judie Petit.  Shick M.D.   On: 06/10/2015 15:40   Ct Lumbar Spine Wo Contrast  06/09/2015  CLINICAL DATA:  82 year old female with lower back pain evaluate for lumbar spine fractures. EXAM: CT LUMBAR SPINE WITHOUT CONTRAST TECHNIQUE: Multidetector CT imaging of the lumbar spine was performed without intravenous contrast administration. Multiplanar CT image reconstructions were also generated. COMPARISON:  CT and radiographs dated 05/10/2015 FINDINGS: There is compression fracture of the L2 vertebra with near complete loss of vertebral body height and anterior wedging similar prior study. There is approximately 4 mm retropulsion of the superior posterior cortex of the L2 with mild focal narrowing of the central canal similar to prior study. There is resulting kyphosis of the spine at L2. No new fracture identified. The bones are osteopenic. Multilevel disc desiccation with vacuum phenomenon noted. There is grade 1 L5-S1 anterolisthesis. Constipation. No definite evidence of bowel obstruction. A 1.5 cm ill-defined hypodensity in the region of the head of the pancreas. IMPRESSION: L2 compression fracture, similar prior study  with near complete loss of vertebral body height and anterior wedging. No new fracture identified.  Electronically Signed   By: Elgie Collard M.D.   On: 06/09/2015 00:43   US Arterial Seg Multiple  06/06/2015  CLINICAL DATA:  Peripheral arterial disease. History of chronic nonhealing wound in left lower extremity. EXAM: NONINVASIVE PHYSIOLOGIC VASCULAR STUDY OF BILATERAL LOWER EXTREMITIES TECHNIQUE: Evaluation of both lower extremities was performed at rest, including calculation of ankle-brachial indices, multiple segmental pressure evaluation, segmental Doppler and segmental pulse volume recording. COMPARISON:  Left lower extremity arteriogram 05/09/2015. ABI examination 01/24/2015. FINDINGS: Right ABI:  1.24 Left ABI:  0.59 Right Lower Extremity: Normal triphasic Doppler waveforms at the right posterior tibial artery and right dorsalis pedis artery. Left Lower Extremity: Monophasic Doppler waveforms in the left posterior tibial artery and dorsalis pedis artery. IMPRESSION: Left ABI is 0.59 and suggest moderate to severe peripheral arterial disease. Normal right ABI, measuring 1.24. Electronically Signed   By: Richarda Overlie M.D.   On: 06/06/2015 16:56    Labs:  CBC:  Recent Labs  05/13/15 0318 05/28/15 1208 06/10/15 1421 06/11/15 0419  WBC 6.7 7.4 9.1 10.0  HGB 9.0* 10.3* 9.4* 9.6*  HCT 28.5* 32.5* 30.2* 31.6*  PLT 131* 229.0 176 155    COAGS:  Recent Labs  01/23/15 1246  05/09/15 0719  05/13/15 0318 05/17/15 06/10/15 1812 06/11/15 0419  INR 2.89*  < > 1.25  < > 1.17 1.9* 5.10* 5.80*  APTT 40*  --  32  --   --   --  50*  --   < > = values in this interval not displayed.  BMP:  Recent Labs  05/12/15 0350 05/13/15 0318 05/28/15 1208 06/10/15 1421 06/11/15 0419  NA 144 142 145 143 145  K 3.8 3.8 5.4* 4.6 4.7  CL 114* 114* 105 107 112*  CO2 20* 23 32 26 22  GLUCOSE 87 88 81 127* 130*  BUN 18 15 25* 50* 41*  CALCIUM 8.3* 8.0* 9.0 8.6* 8.0*  CREATININE 0.95 1.00 1.02  1.78* 1.40*  GFRNONAA 54* 51*  --  25* 34*  GFRAA >60 59*  --  29* 39*    LIVER FUNCTION TESTS:  Recent Labs  02/14/15 1331 05/10/15 0143 05/11/15 0330 06/10/15 1421  BILITOT 0.7 0.9 2.5* 1.3*  AST 21 74* 112* 37  ALT 8 23 50 22  ALKPHOS 76 52 46 70  PROT 7.6 5.6* 5.5* 7.2  ALBUMIN 3.3* 2.2* 1.9* 2.8*    TUMOR MARKERS: No results for input(s): AFPTM, CEA, CA199, CHROMGRNA in the last 8760 hours.  Assessment and Plan:  82 yo female with left LE wound, of both venous and arterial component.  The arterial component has been treated 05/09/2015.  Today she has +doppler signal at the [facility] and PT, as well as brisk venous oozing at removal of the dressings, and I feel for now the arterial inflow is adequate.  I think that the venous obstructive component is significantly hindering her healing capability.    I have offered her a LE venogram and possible reconstruction.  We had a complete discussion of the implications and risk/benefit analysis at our clinic visit 2/9 she told me she would consider.  Currently she is awaiting a bed at [facility].   At this time she has a supra-therapeutic INR, which we wound need to address for any endovascular reconstruction.   She currently has renal insufficiency.   She also has a documented contrast allergy to IV contrast.    I believe a LLE venous duplex is indicated, to document the extension of  her venous disease.  I do not think a repeat arterial study is indicated at this time, as one was completed on 2/9.  Results are in the imaging PACS.    I agree with current medical therapy, and will discuss with her managing teams.    VIR will follow.      Electronically Signed: Gilmer Mor 06/11/2015, 7:11 AM   I spent a total of  40 Minutes   in face to face in clinical consultation, greater than 50% of which was counseling/coordinating care for lower extremity wound, venous etiology, arterial etiology, possible venous intervention.

## 2015-06-11 NOTE — ED Notes (Signed)
Report called to Chauncey at [facility]

## 2015-06-11 NOTE — ED Notes (Signed)
Pt stated she felt like she needed to urinate, but was unable to after 3 unsuccessful attempts using a bedpan. Pt agreed to be in and out cathed. Obtained 900 mls of urine from in and out cath, sent sample to lab. Changed patient's bedding, gave patient breakfast. Patient states no other needs at this time.

## 2015-06-11 NOTE — Progress Notes (Signed)
[facility] PROGRESS NOTE  Kristina Diaz VVZ:[id number] DOB: 11-Mar-1930 DOA: 06/10/2015 PCP: Nyoka Cowden, MD  Brief Summary  The patient is a 82 y.o. year-old female with history of atrial fibrillation, PVD, heart failure, LLE DVT, chronic left leg wound who presents with fever and weakness. The patient has a history of nonhealing ulcers of the left lower extremity and underwent left popliteal and tibial revascularization with PTA of the popliteal, anterior tibial, and peroneal arteries by Dr. Earleen Newport on 05/09/2015.  Dr. Earleen Newport also suspects she has significant venous disease contributing to her worsening left leg wounds.  She has been started on broad-spectrum antibiotics while she thinks about the possibility of undergoing a venogram with intervention for limb salvage.  If she decides against venous intervention, she may need amputation.  Just allowing INR to trend down for now while we await her decision.    Assessment/Plan  Severe sepsis (fever to 101F at facility, tachycardia), possibly due to left lower extremity infected ulcer with surrounding cellulitis. Odor smells like gangrene and no palpable pulse, but dopplerable. Known peripheral arterial disease and venous disease.   - Blood cultures NGTD - ESR 77 and CRP 16.1 - d/c IVF - UA neg - Continue vancomycin and zosyn   Peripheral arterial disease s/p recent angioplasty of LLE 04/2015 by Dr. Earleen Newport - Continue aspirin - Heparin gtt once INR subtherapeutic - Dr. Earleen Newport would like to perform CO2 venogram with intervention for limb salvage, but she is thinking about possibility.  If she declines this procedure, she may need amputation and orthopedics consult - Note: Allergy to contrast media -  [facility] for [facility]-lab procedure  Chronic atrial fibrillation, mildly tachycardic, but trending down with IVF - INR rising slightly but no obvious bleeding - Hold warfarin - Once INR < 2, start heparin  gtt  Chronic diastolic and systolic heart failure, EF 45-50% with history of moderate TR.  Has a LOUD murmur on exam at the apex that appears to be not previously documented.   - Hold torsemide - Start judicious IVF - Daily weights and strict I/O -  Repeat ECHO  AKI, baseline creatinine 1, trending down with IVF - d/c IVF - Repeat creatinine in AM  Kyphosis, severe compression fracture of L4  Hypothyroidism, stable, continue sythroid  Acute urinary retention, may be related to pain mediation or constipation -  Consider foley if she continues to have problems -  Judicious use of narcotics -  Will give bisacodyl suppository for constipation   Diet: Healthy heart Access: PIV IVF: off Proph: Warfarin/heparin gtt  Code Status: DNR Family Communication: patient alone Disposition Plan: Admit to telemetry  Consultants:  Dr. Earleen Newport, IR  Procedures:  Duplex left leg, venous  Antibiotics:  vanc and zosyn 2/13 >    HPI/Subjective:  Having some intermittent stinging of the left leg.  Swelling may be a little better today, but she is not sure.  She denies cough, SOB, nausea, vomiting.  Hungry and wants to eat her lunch.    Objective: Filed Vitals:   06/11/15 1016 06/11/15 1054 06/11/15 1206 06/11/15 1207  BP: 103/53 110/64 96/61   Pulse: 98 99 101   Temp: 97.4 F (36.3 C)   97.8 F (36.6 C)  TempSrc:    Oral  Resp: 15  19   Height:      Weight:      SpO2: 93%  95%     Intake/Output Summary (Last 24 hours) at 06/11/15 1457 Last data filed at  06/11/15 1058  Gross per 24 hour  Intake     10 ml  Output    900 ml  Net   -890 ml   Filed Weights   06/10/15 1311 06/10/15 1420  Weight: 56.246 kg (124 lb) 56.246 kg (124 lb)   Body mass index is 21.27 kg/(m^2).  Exam:   General:  Adult female, severe kyphosis, No acute distress  HEENT:  NCAT, MMM  Cardiovascular:  IRRR, nl S1, S2 no mrg, 2+ pulses, warm extremities  Respiratory:  CTAB, no increased  WOB  Abdomen:   NABS, soft, NT/ND  MSK:   Normal tone and bulk, left foot markedly less erythematous, stable 1+ pitting edema.  Foul/sweet odor persists.  Just redressed by RN so did not fully remove dressing for exam today.    Neuro:  Grossly intact  Data Reviewed: Basic Metabolic Panel:  Recent Labs Lab 06/10/15 1421 06/11/15 0419  NA 143 145  K 4.6 4.7  CL 107 112*  CO2 26 22  GLUCOSE 127* 130*  BUN 50* 41*  CREATININE 1.78* 1.40*  CALCIUM 8.6* 8.0*   Liver Function Tests:  Recent Labs Lab 06/10/15 1421  AST 37  ALT 22  ALKPHOS 70  BILITOT 1.3*  PROT 7.2  ALBUMIN 2.8*   No results for input(s): LIPASE, AMYLASE in the last 168 hours. No results for input(s): AMMONIA in the last 168 hours. CBC:  Recent Labs Lab 06/10/15 1421 06/11/15 0419  WBC 9.1 10.0  NEUTROABS 6.6  --   HGB 9.4* 9.6*  HCT 30.2* 31.6*  MCV 94.1 95.8  PLT 176 155    Recent Results (from the past 240 hour(s))  Culture, blood (routine x 2)     Status: None (Preliminary result)   Collection Time: 06/10/15  2:20 PM  Result Value Ref Range Status   Specimen Description BLOOD RIGHT FOREARM  Final   Special Requests BOTTLES DRAWN AEROBIC AND ANAEROBIC 5 CC EA  Final   Culture PENDING  Incomplete   Report Status PENDING  Incomplete     Studies: Dg Chest 2 View  06/10/2015  CLINICAL DATA:  Fever, atrial fibrillation EXAM: CHEST  2 VIEW COMPARISON:  05/10/2015 FINDINGS: Cardiomegaly again noted. No acute infiltrate or pleural effusion. No pulmonary edema. The left lung apex is obscured by overlying patient's chain. Osteopenia and degenerative changes thoracic spine. Again noted old fracture deformity of L2 vertebral body. IMPRESSION: No active disease. Osteopenia and degenerative changes thoracic spine. Stable chronic fracture deformity of L2 vertebral body. Electronically Signed   By: Lahoma Crocker M.D.   On: 06/10/2015 14:02   Dg Tibia/fibula Left  06/10/2015  CLINICAL DATA:  Left lower  extremity nonhealing wound, peripheral vascular disease EXAM: LEFT TIBIA AND FIBULA - 2 VIEW COMPARISON:  05/09/2015 FINDINGS: Bones are osteopenic. Normal alignment. No acute osseous finding or fracture. Peripheral atherosclerosis evident. IMPRESSION: No acute osseous finding. Osteopenia Peripheral atherosclerosis Electronically Signed   By: Jerilynn Mages.  Shick M.D.   On: 06/10/2015 15:40    Scheduled Meds: . aspirin EC  81 mg Oral Daily  . docusate sodium  100 mg Oral Daily  . feeding supplement (PRO-STAT SUGAR FREE 64)  30 mL Oral BID  . hydrocortisone sod succinate (SOLU-CORTEF) inj  50 mg Intravenous Q6H  . levothyroxine  37.5 mcg Oral QAC breakfast  . metoprolol succinate  12.5 mg Oral Daily  . multivitamin with minerals  1 tablet Oral q morning - 10a  . oxyCODONE  10 mg Oral  Q12H  . potassium chloride  10 mEq Oral BID  . sodium chloride flush  3 mL Intravenous Q12H  . vancomycin  750 mg Intravenous Q24H   Continuous Infusions: . piperacillin-tazobactam (ZOSYN)  IV 3.375 g (06/11/15 1318)    Active Problems:   PAD (peripheral artery disease) (HCC)   Atrial fibrillation (HCC)   Hypothyroidism   Long term (current) use of anticoagulants   Sepsis (HCC)   Leg ulcer, left (HCC)   Chronic systolic CHF (congestive heart failure) ([place])   Cellulitis of left lower extremity   Critical lower limb ischemia   Hypotension   AKI (acute kidney injury) ([place])    Time spent: 30 min    Chad Donoghue, [facility] Pager [phone]. If 7PM-7AM, please contact night-coverage at [email or web address], password [facility] 06/11/2015, 2:57 PM  LOS: 1 day

## 2015-06-12 ENCOUNTER — Inpatient Hospital Stay (HOSPITAL_COMMUNITY): Payer: Medicare Other

## 2015-06-12 DIAGNOSIS — R011 Cardiac murmur, unspecified: Secondary | ICD-10-CM

## 2015-06-12 DIAGNOSIS — I4891 Unspecified atrial fibrillation: Secondary | ICD-10-CM

## 2015-06-12 LAB — PROTIME-INR
INR: 5.36 (ref 0.00–1.49)
PROTHROMBIN TIME: 47.9 s — AB (ref 11.6–15.2)

## 2015-06-12 LAB — BASIC METABOLIC PANEL
ANION GAP: 11 (ref 5–15)
BUN: 43 mg/dL — ABNORMAL HIGH (ref 6–20)
CALCIUM: 8.9 mg/dL (ref 8.9–10.3)
CO2: 24 mmol/L (ref 22–32)
Chloride: 109 mmol/L (ref 101–111)
Creatinine, Ser: 1.34 mg/dL — ABNORMAL HIGH (ref 0.44–1.00)
GFR, EST AFRICAN AMERICAN: 41 mL/min — AB (ref >60)
GFR, EST NON AFRICAN AMERICAN: 36 mL/min — AB (ref >60)
GLUCOSE: 174 mg/dL — AB (ref 65–99)
Potassium: 4.6 mmol/L (ref 3.5–5.1)
SODIUM: 144 mmol/L (ref 135–145)

## 2015-06-12 LAB — URINE CULTURE: Culture: NO GROWTH

## 2015-06-12 LAB — MRSA PCR SCREENING: MRSA BY PCR: NEGATIVE

## 2015-06-12 MED ORDER — COLLAGENASE 250 UNIT/GM EX OINT
TOPICAL_OINTMENT | Freq: Every day | CUTANEOUS | Status: DC
  Administered 2015-06-13 – 2015-06-20 (×7): via TOPICAL
  Filled 2015-06-12 (×2): qty 30

## 2015-06-12 MED ORDER — PHYTONADIONE 5 MG PO TABS
5.0000 mg | ORAL_TABLET | Freq: Once | ORAL | Status: AC
  Administered 2015-06-12: 5 mg via ORAL
  Filled 2015-06-12: qty 1

## 2015-06-12 MED ORDER — ENSURE ENLIVE PO LIQD
237.0000 mL | Freq: Three times a day (TID) | ORAL | Status: DC
  Administered 2015-06-12 – 2015-06-18 (×12): 237 mL via ORAL

## 2015-06-12 NOTE — Consult Note (Addendum)
WOC wound consult note Reason for Consult: Consult requested for left leg wounds.  Pt is followed as an outpatient by the wound care center.  She is scheduled for a vascular procedure according to the EMR. She uses Santyl prior to admission and relieves serial debridements. Wound type: Chronic full thickness stasis ulcers wounds to left calf; these are very painful. Measurement: Anterior calf full thickness wounds; 2X2X.1cm, 2X2X.1cm and .3X.3X.1cm, all 100% yellow moist wound bed without odor, small amt yellow drainage. Left posterior calf full thickness wound; 15X14X.2cm, 60% red, 40% yellow, large amt yellow-red drainage and strong odor. Dressing procedure/placement/frequency: Continue present plan of care as previously ordered with Santyl for chemical debridement, and ace wrap for light compression.  Since Santyl was not available during this dressing change, applied wound gel today and chemical debridement can be performed with tomorrow's dressing change.  Discussed plan of care and pt verbalized understanding. Pt can resume follow-up with the outpatient wound care center after discharge. Please re-consult if further assistance is needed.  Thank-you,  Cammie Mcgee MSN, RN, CWOCN, [place], CNS [phone]

## 2015-06-12 NOTE — Progress Notes (Signed)
Interventional Radiology Progress Note   82 yo female admit with left LE chronic wound, tachycardia, fever, & diagnosis of sepsis.  Wound has both venous and arterial component, in this patient with Hx of remote DVT and prior embolic event to the LLE.  Arterial component was addressed 05/09/2015, with recan of popliteal occlusion and restoration of tibial flow.    Overnight patient denies feeling feverish. No rigors/chills.  Denies leg pain.  Reports using the bedpan and urinating normally.  No report of bleeding.    Repeat LLE venous duplex confirms patent femoral and pop vein.   Prior CT shows extensive bilateral collateral vein formation of the body wall, which indicates venous obstructive component.  Also, calf veins are not well visualized on duplex and CT.  Possible component of venous obstruction of the tibial vein.  I believe venogram and possible reconstruction is indicated.   Repeat INR pending for supratherapeutic INR.  Repeat BMP pending for renal insufficiency.   Assessment/Plan:  - Discussed case with Dr Malachi Bonds of primary and Dr. Leanord Hawking of [facility].  - Discussed impression with patient regarding need for lower extremity venogram and possible reconstruction when medically able. Patient is considering options.  - Wound engage the wound care team (ostomy nurses/wound care nurses) for assistance with wound care management.  - Agree with current medical care.  Call with questions/concerns.   Pager [phone]  Signed,  Yvone Neu. Loreta Ave, DO

## 2015-06-12 NOTE — Progress Notes (Signed)
Initial Nutrition Assessment  DOCUMENTATION CODES:   Severe malnutrition in context of chronic illness  INTERVENTION:   -D/c Prostat, per pt request -Ensure Enlive po TID, each supplement provides 350 kcal and 20 grams of protein -MVI daily  NUTRITION DIAGNOSIS:   Malnutrition related to chronic illness as evidenced by severe depletion of muscle mass, severe depletion of body fat.  GOAL:   Patient will meet greater than or equal to 90% of their needs  MONITOR:   PO intake, Supplement acceptance, Labs, Weight trends, Skin, I & O's  REASON FOR ASSESSMENT:   Low Braden    ASSESSMENT:   The patient is a 82 y.o. year-old female with history of atrial fibrillation, PVD, heart failure, LLE DVT, chronic left leg wound who presents with fever and weakness. The patient has a history of nonhealing ulcers of the left lower extremity and underwent left popliteal and tibial revascularization with PTA of the popliteal, anterior tibial, and peroneal arteries by Dr. Loreta Ave on 05/09/2015. He used ultrasound-guided antegrade left SFA access an ultrasound-guided DP access of the foot. At the end of the case, the patient had a palpable pulse according to notes. Postprocedure, she had a fall and was found to have a hematoma at the left SFA access which resulted in an drop in her hemoglobin. She was initially on Plavix, however, she was changed to full dose aspirin which she was supposed to take for 30 days. She was also restarted on her Coumadin.   Pt admitted with PAD and severe sepsis.   Hx obtained from pt at bedside. She complains that she is "starving" and is anxiously awaiting her lunch. She reveals her appetite is very good and consumes 3 meals per day PTA. SHe reports she ate about 75% of her breakfast meal earlier this morning (meal completion 85%).   Pt denies any weight loss, which is consistent with wt hx. She confirms chronic leg ulcers (she is followed at the wound center) which have  been difficult to manage. Per [facility] note, pt with anterior calf full thickness wounds and left posterior calf full thickness wounds. Per MD notes, pt is deciding if she wants to undergo venogram.   Pt shares that she was consuming 1-2 bottles of Ensure PTA. She dislikes the Prostat supplement ordered. Pt has increased nutrient needs to facilitate wound healing. Discussed importance of good meal and supplement intake to support healing.   Nutrition-Focused physical exam completed. Findings are severe fat depletion, severe muscle depletion, and no edema.   Labs reviewed: Glucose: 130.   Diet Order:  Diet Heart Room service appropriate?: Yes; Fluid consistency:: Thin  Skin:  Wound (see comment) (anterior calf full thickness wounds. lt posterior calf full thickness wound)  Last BM:  06/12/15  Height:   Ht Readings from Last 1 Encounters:  06/10/15  (1.626 m)    Weight:   Wt Readings from Last 1 Encounters:  06/10/15 124 lb (56.246 kg)    Ideal Body Weight:  54.4 kg  BMI:  Body mass index is 21.27 kg/(m^2).  Estimated Nutritional Needs:   Kcal:  1700-1900  Protein:  85-100 grams  Fluid:  1.7-1.9 L  EDUCATION NEEDS:   Education needs addressed  Wyoma Genson A. Mayford Knife, RD, LDN, CDE Pager: [phone] After hours Pager: [phone]

## 2015-06-12 NOTE — Progress Notes (Signed)
[facility] PROGRESS NOTE  Kristina Diaz SLP:[id number] DOB: 1929/12/09 DOA: 06/10/2015 PCP: Nyoka Cowden, MD  Brief Summary  Chart reviewed. The patient is a 82 y.o. year-old female with history of atrial fibrillation, PVD, heart failure, LLE DVT, chronic left leg wound who presents with fever and weakness. The patient has a history of nonhealing ulcers of the left lower extremity and underwent left popliteal and tibial revascularization with PTA of the popliteal, anterior tibial, and peroneal arteries by Dr. Earleen Newport on 05/09/2015.  Dr. Earleen Newport also suspects she has significant venous disease contributing to her worsening left leg wounds.  She has been started on broad-spectrum antibiotics while she thinks about the possibility of undergoing a venogram with intervention for limb salvage.  If she decides against venous intervention, she may need amputation.  Just allowing INR to trend down for now while we await her decision.    Assessment/Plan  Severe sepsis (fever to 101F at facility, tachycardia), possibly due to left lower extremity infected ulcer with surrounding cellulitis. Known peripheral arterial disease and venous disease.  Hemodynamically stable on the floor - Blood cultures NGTD - ESR 77 and CRP 16.1 - d/c IVF - UA neg - Continue vancomycin and zosyn   Peripheral arterial disease s/p recent angioplasty of LLE 04/2015 by Dr. Earleen Newport - Continue aspirin - Heparin gtt once INR subtherapeutic - Dr. Earleen Newport would like to perform CO2 venogram with intervention for limb salvage, but she is thinking about possibility.  If she declines this procedure, she may need amputation and orthopedics consult - Note: Allergy to contrast media  Chronic atrial fibrillation, now rate controlled - INR rising slightly but no obvious bleeding. Give small dose po vit K today - Hold warfarin - Once INR < 2, start heparin gtt  Chronic diastolic and systolic heart failure, EF 45-50%  with history of moderate TR.  Has a LOUD murmur on exam at the apex that appears to be not previously documented.   Torsemide held. euvolemic currently. Weight stable -  Repeat ECHO pending  AKI, baseline creatinine 1, trending down with IVF No labs today. Will order for am  Kyphosis, severe compression fracture of L4  Hypothyroidism, stable, continue sythroid  Not sure why she is on hydrocortisone. Will d/c and monitor. Am cortisol 15  Diet: Healthy heart Access: PIV IVF: off Proph: Warfarin/  Code Status: DNR Family Communication: patient alone Disposition Plan:  Pending decision about venous intervention, and decrease of INR  Consultants:  Dr. Earleen Newport, IR  Procedures:  Duplex left leg, venous  Antibiotics:  vanc and zosyn 2/13 >    HPI/Subjective:  No dyspnea. Chronic leg pain unchanged. Undecided about venogram  Objective: Filed Vitals:   06/11/15 1917 06/11/15 2040 06/12/15 0533 06/12/15 1056  BP: 126/82 105/72 108/59 110/61  Pulse: 100 87 73   Temp:  97.8 F (36.6 C) 98 F (36.7 C)   TempSrc:  Oral Oral   Resp: 17 18 18    Height:      Weight:      SpO2: 99% 96% 97%     Intake/Output Summary (Last 24 hours) at 06/12/15 1347 Last data filed at 06/12/15 0900  Gross per 24 hour  Intake    170 ml  Output      0 ml  Net    170 ml   Filed Weights   06/10/15 1311 06/10/15 1420  Weight: 56.246 kg (124 lb) 56.246 kg (124 lb)   Body mass index is 21.27 kg/(m^2).  Exam:  General:  A and o, nontoxic appearing, feeding self lunch  Cardiovascular:  irreg irreg. Murmur present  Respiratory:  CTAB, no increased WOB  Abdomen:   NABS, soft, NT/ND  MSK:   Trace edema.  Foul odor persists.  kerlex in place   Data Reviewed: Basic Metabolic Panel:  Recent Labs Lab 06/10/15 1421 06/11/15 0419  NA 143 145  K 4.6 4.7  CL 107 112*  CO2 26 22  GLUCOSE 127* 130*  BUN 50* 41*  CREATININE 1.78* 1.40*  CALCIUM 8.6* 8.0*   Liver Function  Tests:  Recent Labs Lab 06/10/15 1421  AST 37  ALT 22  ALKPHOS 70  BILITOT 1.3*  PROT 7.2  ALBUMIN 2.8*   No results for input(s): LIPASE, AMYLASE in the last 168 hours. No results for input(s): AMMONIA in the last 168 hours. CBC:  Recent Labs Lab 06/10/15 1421 06/11/15 0419  WBC 9.1 10.0  NEUTROABS 6.6  --   HGB 9.4* 9.6*  HCT 30.2* 31.6*  MCV 94.1 95.8  PLT 176 155    Recent Results (from the past 240 hour(s))  Culture, blood (routine x 2)     Status: None (Preliminary result)   Collection Time: 06/10/15  2:20 PM  Result Value Ref Range Status   Specimen Description BLOOD RIGHT FOREARM  Final   Special Requests BOTTLES DRAWN AEROBIC AND ANAEROBIC 5 CC EA  Final   Culture   Final    NO GROWTH 2 DAYS Performed at [facility]    Report Status PENDING  Incomplete  Culture, blood (routine x 2)     Status: None (Preliminary result)   Collection Time: 06/10/15  2:25 PM  Result Value Ref Range Status   Specimen Description BLOOD RIGHT ANTECUBITAL  Final   Special Requests BOTTLES DRAWN AEROBIC AND ANAEROBIC 5 CC EA  Final   Culture   Final    NO GROWTH 2 DAYS Performed at [facility]    Report Status PENDING  Incomplete  Urine culture     Status: None   Collection Time: 06/11/15  8:47 AM  Result Value Ref Range Status   Specimen Description URINE, CATHETERIZED  Final   Special Requests NONE  Final   Culture   Final    NO GROWTH 1 DAY Performed at [facility]    Report Status 06/12/2015 FINAL  Final  MRSA PCR Screening     Status: None   Collection Time: 06/11/15 10:50 PM  Result Value Ref Range Status   MRSA by PCR NEGATIVE NEGATIVE Final    Comment:        The GeneXpert MRSA Assay (FDA approved for NASAL specimens only), is one component of a comprehensive MRSA colonization surveillance program. It is not intended to diagnose MRSA infection nor to guide or monitor treatment for MRSA infections.      Studies: Dg Chest 2  View  06/10/2015  CLINICAL DATA:  Fever, atrial fibrillation EXAM: CHEST  2 VIEW COMPARISON:  05/10/2015 FINDINGS: Cardiomegaly again noted. No acute infiltrate or pleural effusion. No pulmonary edema. The left lung apex is obscured by overlying patient's chain. Osteopenia and degenerative changes thoracic spine. Again noted old fracture deformity of L2 vertebral body. IMPRESSION: No active disease. Osteopenia and degenerative changes thoracic spine. Stable chronic fracture deformity of L2 vertebral body. Electronically Signed   By: Lahoma Crocker M.D.   On: 06/10/2015 14:02   Dg Tibia/fibula Left  06/10/2015  CLINICAL DATA:  Left lower extremity  nonhealing wound, peripheral vascular disease EXAM: LEFT TIBIA AND FIBULA - 2 VIEW COMPARISON:  05/09/2015 FINDINGS: Bones are osteopenic. Normal alignment. No acute osseous finding or fracture. Peripheral atherosclerosis evident. IMPRESSION: No acute osseous finding. Osteopenia Peripheral atherosclerosis Electronically Signed   By: Jerilynn Mages.  Shick M.D.   On: 06/10/2015 15:40    Scheduled Meds: . aspirin EC  81 mg Oral Daily  . bisacodyl  10 mg Rectal Once  . docusate sodium  100 mg Oral Daily  . feeding supplement (PRO-STAT SUGAR FREE 64)  30 mL Oral BID  . hydrocortisone sod succinate (SOLU-CORTEF) inj  50 mg Intravenous Q6H  . levothyroxine  37.5 mcg Oral QAC breakfast  . metoprolol succinate  12.5 mg Oral Daily  . multivitamin with minerals  1 tablet Oral q morning - 10a  . oxyCODONE  10 mg Oral Q12H  . piperacillin-tazobactam (ZOSYN)  IV  3.375 g Intravenous Q8H  . potassium chloride  10 mEq Oral BID  . sodium chloride flush  3 mL Intravenous Q12H  . vancomycin  750 mg Intravenous Q24H   Continuous Infusions:    Active Problems:   PAD (peripheral artery disease) (HCC)   Atrial fibrillation (HCC)   Hypothyroidism   Long term (current) use of anticoagulants   Sepsis ([place])   Leg ulcer, left (HCC)   Chronic systolic CHF (congestive heart failure)  (HCC)   Cellulitis of left lower extremity   Critical lower limb ischemia   Hypotension   AKI (acute kidney injury) ([place])    Time spent: 25 min    [facility]  [email or web address], password [facility] 06/12/2015, 1:47 PM  LOS: 2 days

## 2015-06-12 NOTE — Progress Notes (Signed)
ANTICOAGULATION CONSULT NOTE - Initial Consult  Pharmacy Consult for Heparin Indication: atrial fibrillation and DVT  Allergies  Allergen Reactions  . Bactrim Ds [Sulfamethoxazole-Trimethoprim] Rash  . Contrast Media [Iodinated Diagnostic Agents] Hives    BLE erythema   . Ciprofloxacin Rash  . Tape Rash   Patient Measurements: Height:  (162.6 cm) Weight: 124 lb (56.246 kg) IBW/kg (Calculated) : 54.7 Heparin Dosing Weight: actual weight  Vital Signs: Temp: 98 F (36.7 C) (02/15 0533) Temp Source: Oral (02/15 0533) BP: 108/59 mmHg (02/15 0533) Pulse Rate: 73 (02/15 0533)  Labs:  Recent Labs  06/10/15 1421 06/10/15 1812 06/11/15 0419  HGB 9.4*  --  9.6*  HCT 30.2*  --  31.6*  PLT 176  --  155  APTT  --  50*  --   LABPROT  --  45.6* 50.2*  INR  --  5.10* 5.80*  CREATININE 1.78*  --  1.40*   Estimated Creatinine Clearance: 26.3 mL/min (by C-G formula based on Cr of 1.4).  Medical History: Past Medical History  Diagnosis Date  . PAD (peripheral artery disease) (HCC) 2013    left leg. sounds like she had angio-plasty   . Atrial fibrillation (HCC) 2013    s/p [facility] that failed. ON medication  . Hypertension   . Osteoporosis   . DVT (deep venous thrombosis) (HCC) 2013    "LLE" (08/04/2012)  . Hypothyroidism   . History of blood transfusion 2013    "related to thrombectomy" (08/04/2012)  . Anemia     "slightly" (08/04/2012)  . Arthritis     "probably minor; knees, pinky" (08/04/2012)  . Fracture of L4 vertebra (HCC)     "dx'd in 2013; can't treat til legs fixed" (08/04/2012)  . Lower extremity ulceration (HCC)   . Biventricular heart failure with reduced left ventricular function (HCC)    Medications:  Scheduled:  . aspirin EC  81 mg Oral Daily  . bisacodyl  10 mg Rectal Once  . docusate sodium  100 mg Oral Daily  . feeding supplement (PRO-STAT SUGAR FREE 64)  30 mL Oral BID  . hydrocortisone sod succinate (SOLU-CORTEF) inj  50 mg Intravenous Q6H  .  levothyroxine  37.5 mcg Oral QAC breakfast  . metoprolol succinate  12.5 mg Oral Daily  . multivitamin with minerals  1 tablet Oral q morning - 10a  . oxyCODONE  10 mg Oral Q12H  . piperacillin-tazobactam (ZOSYN)  IV  3.375 g Intravenous Q8H  . potassium chloride  10 mEq Oral BID  . sodium chloride flush  3 mL Intravenous Q12H  . vancomycin  750 mg Intravenous Q24H    Assessment: 85 yoF admitted on 2/13 with fever, weakness, and sepsis.  PMH includes chronic warfarin anticoagulation for afib and DVT.  INR 5.1 on admission is supratherapeutic and warfarin is held.  Pharmacy is consulted to begin Heparin IV when INR < 2. INR 5.8, Hg low 9.6, plt wnl, no bleed documented. Possibly another IR procedure for limb salvage vs. Amputation. To give small dose of vit k today and f/u INR in AM.  Goal of Therapy:  Heparin level 0.3-0.7 units/ml Monitor platelets by anticoagulation protocol: Yes   Plan:  Heparin IV when INR < 2 Daily INR Hold warfarin, monitor s/sx bleeding F/u plans for procedures  Babs Bertin, PharmD, BCPS Clinical Pharmacist Pager [phone] 06/12/2015 8:30 AM

## 2015-06-12 NOTE — Progress Notes (Signed)
Critical INR 5.6 called up per lab  Dr Lendell Caprice notified.No new orders at this time.

## 2015-06-12 NOTE — Progress Notes (Signed)
  Echocardiogram 2D Echocardiogram has been performed.  Janalyn Harder 06/12/2015, 12:21 PM

## 2015-06-13 DIAGNOSIS — E43 Unspecified severe protein-calorie malnutrition: Secondary | ICD-10-CM | POA: Insufficient documentation

## 2015-06-13 LAB — BASIC METABOLIC PANEL
ANION GAP: 10 (ref 5–15)
BUN: 39 mg/dL — AB (ref 6–20)
CHLORIDE: 112 mmol/L — AB (ref 101–111)
CO2: 22 mmol/L (ref 22–32)
Calcium: 8.9 mg/dL (ref 8.9–10.3)
Creatinine, Ser: 1.18 mg/dL — ABNORMAL HIGH (ref 0.44–1.00)
GFR calc Af Amer: 48 mL/min — ABNORMAL LOW (ref >60)
GFR, EST NON AFRICAN AMERICAN: 41 mL/min — AB (ref >60)
Glucose, Bld: 96 mg/dL (ref 65–99)
POTASSIUM: 4.6 mmol/L (ref 3.5–5.1)
SODIUM: 144 mmol/L (ref 135–145)

## 2015-06-13 LAB — CBC
HEMATOCRIT: 29.5 % — AB (ref 36.0–46.0)
HEMOGLOBIN: 9.2 g/dL — AB (ref 12.0–15.0)
MCH: 28.7 pg (ref 26.0–34.0)
MCHC: 31.2 g/dL (ref 30.0–36.0)
MCV: 91.9 fL (ref 78.0–100.0)
Platelets: 190 10*3/uL (ref 150–400)
RBC: 3.21 MIL/uL — AB (ref 3.87–5.11)
RDW: 18.5 % — ABNORMAL HIGH (ref 11.5–15.5)
WBC: 9.8 10*3/uL (ref 4.0–10.5)

## 2015-06-13 LAB — PROTIME-INR
INR: 2.35 — AB (ref 0.00–1.49)
Prothrombin Time: 25.5 seconds — ABNORMAL HIGH (ref 11.6–15.2)

## 2015-06-13 MED ORDER — TORSEMIDE 20 MG PO TABS
20.0000 mg | ORAL_TABLET | Freq: Every day | ORAL | Status: DC
  Administered 2015-06-13 – 2015-06-15 (×3): 20 mg via ORAL
  Filled 2015-06-13 (×3): qty 1

## 2015-06-13 MED ORDER — HYDROMORPHONE HCL 1 MG/ML IJ SOLN
0.5000 mg | Freq: Every day | INTRAMUSCULAR | Status: DC | PRN
  Administered 2015-06-13 – 2015-06-18 (×5): 0.5 mg via INTRAVENOUS
  Filled 2015-06-13 (×5): qty 1

## 2015-06-13 MED ORDER — PHYTONADIONE 5 MG PO TABS
5.0000 mg | ORAL_TABLET | Freq: Once | ORAL | Status: AC
  Administered 2015-06-13: 5 mg via ORAL
  Filled 2015-06-13: qty 1

## 2015-06-13 MED ORDER — LISINOPRIL 5 MG PO TABS
2.5000 mg | ORAL_TABLET | Freq: Every day | ORAL | Status: DC
  Administered 2015-06-13: 2.5 mg via ORAL
  Filled 2015-06-13 (×3): qty 1

## 2015-06-13 NOTE — Care Management Note (Addendum)
Case Management Note  Patient Details  Name: Kristina Diaz MRN: [id number] Date of Birth: 03-19-1930  Subjective/Objective:                 Patient admitted from [facility] for sepsis and wounds. May have amputation prior to discharge.   Action/Plan:  Anticipate DC to SNF when medically stable.  Expected Discharge Date:                  Expected Discharge Plan:  Skilled Nursing Facility  In-House Referral:  Clinical Social Work  Discharge planning Services     Post Acute Care Choice:    Choice offered to:     DME Arranged:    DME Agency:     HH Arranged:    HH Agency:     Status of Service:  Completed, signed off  Medicare Important Message Given:  Yes Date Medicare IM Given:    Medicare IM give by:    Date Additional Medicare IM Given:    Additional Medicare Important Message give by:     If discussed at Long Length of Stay Meetings, dates discussed:    Additional Comments:  Lawerance Sabal, RN 06/13/2015, 12:40 PM

## 2015-06-13 NOTE — Progress Notes (Signed)
Follow up for Dr. Loreta Ave. Pt does wish to pursue venous intervention if it will help her leg. Her INR is trending down and she will receive another dose of Vit K. Will be made NPO p MN for possible procedure tomorrow. Dr. Loreta Ave trying to make arrangements to be here tomorrow. If not, will allow pt to eat and keep treatment team informed.  (L)foot warm, good cap refill, sensation intact. Large dressing/wrap intact. I did not take this down.  Brayton El PA-C Interventional Radiology 06/13/2015 2:48 PM

## 2015-06-13 NOTE — Care Management Important Message (Signed)
Important Message  Patient Details  Name: Kristina Diaz MRN: [id number] Date of Birth: October 26, 1929   Medicare Important Message Given:  Yes    [name] 06/13/2015, 12:23 PM

## 2015-06-13 NOTE — Progress Notes (Signed)
Addendum  Discussed with Dr. Loreta Ave. He will try to get patient on the schedule for venogram tomorrow. Will give another dose of vitamin K today, recheck INR and start heparin drip once INR below 2. Will make patient nothing by mouth in case he is able to put her on the schedule.  Crista Curb, MD [facility]

## 2015-06-13 NOTE — Progress Notes (Signed)
[facility] PROGRESS NOTE  Kristina Diaz TDH:[id number] DOB: 09-May-1929 DOA: 06/10/2015 PCP: Nyoka Cowden, MD  Brief Summary  Chart reviewed. The patient is a 82 y.o. year-old female with history of atrial fibrillation, PVD, heart failure, LLE DVT, chronic left leg wound who presents with fever and weakness. The patient has a history of nonhealing ulcers of the left lower extremity and underwent left popliteal and tibial revascularization with PTA of the popliteal, anterior tibial, and peroneal arteries by Dr. Earleen Newport on 05/09/2015.  Dr. Earleen Newport also suspects she has significant venous disease contributing to her worsening left leg wounds.  She has been started on broad-spectrum antibiotics while she thinks about the possibility of undergoing a venogram with intervention for limb salvage.    Assessment/Plan  Severe sepsis (fever to 101F at facility, tachycardia), possibly due to left lower extremity infected ulcer with surrounding cellulitis. Known peripheral arterial disease and venous disease.  Hemodynamically stable- Blood cultures NGTD - ESR 77 and CRP 16.1 - d/c IVF - UA neg - Continue vancomycin and zosyn   Peripheral arterial disease s/p recent angioplasty of LLE 04/2015 by Dr. Earleen Newport - Continue aspirin - Heparin gtt once INR subtherapeutic Dr. Earleen Newport who recommends venogram to evaluate for venous obstruction. Patient now agreeable. Have paged him. INR down to 2.3 today. With another dose of vitamin K, suspect INR will be below 1.5 tomorrow and can likely proceed.  Chronic atrial fibrillation, now rate controlled - Hold warfarin - Once INR < 2, start heparin gtt  Chronic diastolic and systolic heart failure, echocardiogram this admission shows ejection fraction of 35%. Has range 30-40% in the past. Has JVD today. Will resume torsemide but once a day rather than twice daily. Add low-dose lisinopril and monitor be metastases. Weights are stable.  AKI,   creatinine 1.4 on admission. 1.18 today.   Kyphosis, severe compression fracture of L4  Hypothyroidism, stable, continue sythroid  Code Status: DNR Family Communication: patient alone Disposition Plan:  Pending decision about venous intervention, and decrease of INR  Consultants:  Dr. Earleen Newport, IR  Procedures:  Duplex left leg, venous  Antibiotics:  vanc and zosyn 2/13 >    HPI/Subjective:  No dyspnea. Chronic leg pain unchanged. Hungry. Agreeable to venogram.  Objective: Filed Vitals:   06/12/15 1352 06/12/15 2206 06/13/15 0516 06/13/15 0927  BP: 121/67 118/70 108/61 114/78  Pulse: 99 82 69 82  Temp:  98.1 F (36.7 C) 98.2 F (36.8 C)   TempSrc:  Oral Oral   Resp: _0 Height:      Weight:      SpO2: 98% 97% 96%     Intake/Output Summary (Last 24 hours) at 06/13/15 1236 Last data filed at 06/13/15 0559  Gross per 24 hour  Intake    780 ml  Output      0 ml  Net    780 ml   Filed Weights   06/10/15 1311 06/10/15 1420  Weight: 56.246 kg (124 lb) 56.246 kg (124 lb)   Body mass index is 21.27 kg/(m^2).  Exam:   General:  A and o, nontoxic appearing,   Cardiovascular:  irreg irreg. Murmur present  Respiratory:  CTAB, no increased WOB  Abdomen:   NABS, soft, NT/ND  MSK:   Trace edema.  Foul odor persists.  Ace bandage in place   Data Reviewed: Basic Metabolic Panel:  Recent Labs Lab 06/10/15 1421 06/11/15 0419 06/12/15 1655 06/13/15 0630  NA 143 145 144 144  K 4.6 4.7  4.6 4.6  CL 107 112* 109 112*  CO2 _0 GLUCOSE 127* 130* 174* 96  BUN 50* 41* 43* 39*  CREATININE 1.78* 1.40* 1.34* 1.18*  CALCIUM 8.6* 8.0* 8.9 8.9   Liver Function Tests:  Recent Labs Lab 06/10/15 1421  AST 37  ALT 22  ALKPHOS 70  BILITOT 1.3*  PROT 7.2  ALBUMIN 2.8*   No results for input(s): LIPASE, AMYLASE in the last 168 hours. No results for input(s): AMMONIA in the last 168 hours. CBC:  Recent Labs Lab 06/10/15 1421 06/11/15 0419  06/13/15 0630  WBC 9.1 10.0 9.8  NEUTROABS 6.6  --   --   HGB 9.4* 9.6* 9.2*  HCT 30.2* 31.6* 29.5*  MCV 94.1 95.8 91.9  PLT 176 155 190    Recent Results (from the past 240 hour(s))  Culture, blood (routine x 2)     Status: None (Preliminary result)   Collection Time: 06/10/15  2:20 PM  Result Value Ref Range Status   Specimen Description BLOOD RIGHT FOREARM  Final   Special Requests BOTTLES DRAWN AEROBIC AND ANAEROBIC 5 CC EA  Final   Culture   Final    NO GROWTH 3 DAYS Performed at [facility]    Report Status PENDING  Incomplete  Culture, blood (routine x 2)     Status: None (Preliminary result)   Collection Time: 06/10/15  2:25 PM  Result Value Ref Range Status   Specimen Description BLOOD RIGHT ANTECUBITAL  Final   Special Requests BOTTLES DRAWN AEROBIC AND ANAEROBIC 5 CC EA  Final   Culture   Final    NO GROWTH 3 DAYS Performed at [facility]    Report Status PENDING  Incomplete  Urine culture     Status: None   Collection Time: 06/11/15  8:47 AM  Result Value Ref Range Status   Specimen Description URINE, CATHETERIZED  Final   Special Requests NONE  Final   Culture   Final    NO GROWTH 1 DAY Performed at [facility]    Report Status 06/12/2015 FINAL  Final  MRSA PCR Screening     Status: None   Collection Time: 06/11/15 10:50 PM  Result Value Ref Range Status   MRSA by PCR NEGATIVE NEGATIVE Final    Comment:        The GeneXpert MRSA Assay (FDA approved for NASAL specimens only), is one component of a comprehensive MRSA colonization surveillance program. It is not intended to diagnose MRSA infection nor to guide or monitor treatment for MRSA infections.      Studies: No results found.  Echo Left ventricle: The cavity size was normal. Systolic function was moderately reduced. The estimated ejection fraction was in the range of 35% to 40%. Diffuse hypokinesis. - Ventricular septum: Septal motion showed paradox. -  Aortic valve: There was mild regurgitation. - Mitral valve: Calcified annulus. Mildly thickened leaflets . There was moderate regurgitation. - Left atrium: The atrium was mildly dilated. - Right ventricle: The cavity size was moderately dilated. Wall thickness was normal. Systolic function was moderately reduced. - Right atrium: The atrium was moderately dilated. - Tricuspid valve: There was moderate regurgitation. - Pulmonic valve: There was moderate regurgitation. - Pulmonary arteries: Systolic pressure was moderately increased. PA peak pressure: 46 mm Hg (S).  Scheduled Meds: . aspirin EC  81 mg Oral Daily  . bisacodyl  10 mg Rectal Once  . collagenase   Topical Daily  .  docusate sodium  100 mg Oral Daily  . feeding supplement (ENSURE ENLIVE)  237 mL Oral TID BM  . levothyroxine  37.5 mcg Oral QAC breakfast  . metoprolol succinate  12.5 mg Oral Daily  . multivitamin with minerals  1 tablet Oral q morning - 10a  . oxyCODONE  10 mg Oral Q12H  . piperacillin-tazobactam (ZOSYN)  IV  3.375 g Intravenous Q8H  . sodium chloride flush  3 mL Intravenous Q12H  . vancomycin  750 mg Intravenous Q24H   Continuous Infusions:    Active Problems:   PAD (peripheral artery disease) (HCC)   Atrial fibrillation (HCC)   Hypothyroidism   Long term (current) use of anticoagulants   Sepsis ([place])   Leg ulcer, left (HCC)   Chronic systolic CHF (congestive heart failure) (HCC)   Cellulitis of left lower extremity   Critical lower limb ischemia   Hypotension   AKI (acute kidney injury) ([place])   Protein-calorie malnutrition, severe    Time spent: 25 min    [facility]  [email or web address], password [facility] 06/13/2015, 12:36 PM  LOS: 3 days

## 2015-06-13 NOTE — Progress Notes (Signed)
ANTICOAGULATION CONSULT NOTE  Pharmacy Consult for Heparin Indication: atrial fibrillation and DVT  Allergies  Allergen Reactions  . Bactrim Ds [Sulfamethoxazole-Trimethoprim] Rash  . Contrast Media [Iodinated Diagnostic Agents] Hives    BLE erythema   . Ciprofloxacin Rash  . Tape Rash   Patient Measurements: Height:  (162.6 cm) Weight: 124 lb (56.246 kg) IBW/kg (Calculated) : 54.7 Heparin Dosing Weight: actual weight  Vital Signs: Temp: 98.2 F (36.8 C) (02/16 0516) Temp Source: Oral (02/16 0516) BP: 108/61 mmHg (02/16 0516) Pulse Rate: 69 (02/16 0516)  Labs:  Recent Labs  06/10/15 1421  06/10/15 1812 06/11/15 0419 06/12/15 1655 06/13/15 0630  HGB 9.4*  --   --  9.6*  --  9.2*  HCT 30.2*  --   --  31.6*  --  29.5*  PLT 176  --   --  155  --  190  APTT  --   --  50*  --   --   --   LABPROT  --   < > 45.6* 50.2* 47.9* 25.5*  INR  --   < > 5.10* 5.80* 5.36* 2.35*  CREATININE 1.78*  --   --  1.40* 1.34* 1.18*  < > = values in this interval not displayed. Estimated Creatinine Clearance: 31.2 mL/min (by C-G formula based on Cr of 1.18).  Medical History: Past Medical History  Diagnosis Date  . PAD (peripheral artery disease) (HCC) 2013    left leg. sounds like she had angio-plasty   . Atrial fibrillation (HCC) 2013    s/p [facility] that failed. ON medication  . Hypertension   . Osteoporosis   . DVT (deep venous thrombosis) (HCC) 2013    "LLE" (08/04/2012)  . Hypothyroidism   . History of blood transfusion 2013    "related to thrombectomy" (08/04/2012)  . Anemia     "slightly" (08/04/2012)  . Arthritis     "probably minor; knees, pinky" (08/04/2012)  . Fracture of L4 vertebra (HCC)     "dx'd in 2013; can't treat til legs fixed" (08/04/2012)  . Lower extremity ulceration (HCC)   . Biventricular heart failure with reduced left ventricular function (HCC)    Medications:  Scheduled:  . aspirin EC  81 mg Oral Daily  . bisacodyl  10 mg Rectal Once  . collagenase    Topical Daily  . docusate sodium  100 mg Oral Daily  . feeding supplement (ENSURE ENLIVE)  237 mL Oral TID BM  . levothyroxine  37.5 mcg Oral QAC breakfast  . metoprolol succinate  12.5 mg Oral Daily  . multivitamin with minerals  1 tablet Oral q morning - 10a  . oxyCODONE  10 mg Oral Q12H  . piperacillin-tazobactam (ZOSYN)  IV  3.375 g Intravenous Q8H  . sodium chloride flush  3 mL Intravenous Q12H  . vancomycin  750 mg Intravenous Q24H    Assessment: 85 yoF admitted on 2/13 with fever, weakness, and sepsis.  PMH includes chronic warfarin anticoagulation for afib and DVT.  INR 5.1 on admission is supratherapeutic and warfarin is held.  Pharmacy is consulted to begin Heparin IV when INR < 2. INR peak 5.8, now down to 2.35 s/p small dose vit k on 2/15. Hg low 9.2, plt wnl, no bleed documented. Possibly another IR procedure for limb salvage vs. Amputation.  Goal of Therapy:  Heparin level 0.3-0.7 units/ml Monitor platelets by anticoagulation protocol: Yes   Plan:  Heparin IV when INR < 2 (likely tomorrow) Daily INR Hold warfarin,  monitor s/sx bleeding F/u plans for procedures  Babs Bertin, PharmD, [facility] Clinical Pharmacist Pager [phone] 06/13/2015 8:55 AM

## 2015-06-13 NOTE — Progress Notes (Signed)
Pharmacy Antibiotic Note  Kristina Diaz is a 82 y.o. female admitted on 06/10/2015 with weakness and fever since 2/8. LLE infected ulcer w/ cellulitis, possible gangrene. Pharmacy has been consulted for vancomycin and zosyn for severe sepsis - Day #4. Afeb, wbc wnl. LA trend down to 1.31. SCr improved 1.4>>1.18, CrCL~31.  Plan: -Zosyn 3.375g IV q8h -Vancomycin  IV q24h -follow renal function, cultures, clinical course -VT@SS  soon if continuing -Will likely need another IR procedure  Height:  (162.6 cm) Weight: 124 lb (56.246 kg) IBW/kg (Calculated) : 54.7  Temp (24hrs), Avg:98.2 F (36.8 C), Min:98.1 F (36.7 C), Max:98.2 F (36.8 C)   Recent Labs Lab 06/10/15 1421 06/10/15 1432 06/10/15 1825 06/11/15 0419 06/12/15 1655 06/13/15 0630  WBC 9.1  --   --  10.0  --  9.8  CREATININE 1.78*  --   --  1.40* 1.34* 1.18*  LATICACIDVEN  --  1.93 1.31  --   --   --     Estimated Creatinine Clearance: 31.2 mL/min (by C-G formula based on Cr of 1.18).    Allergies  Allergen Reactions  . Bactrim Ds [Sulfamethoxazole-Trimethoprim] Rash  . Contrast Media [Iodinated Diagnostic Agents] Hives    BLE erythema   . Ciprofloxacin Rash  . Tape Rash    Antimicrobials this admission: 2/13 vanc >> 2/13 zosyn >>  Dose adjustments this admission: n/a  Microbiology results: 2/13 BCx: ngtd 2/13 UCx: ngf 2/14 MRSA pcr: neg  Babs Bertin, PharmD, BCPS Clinical Pharmacist Pager [phone] 06/13/2015 9:04 AM

## 2015-06-14 DIAGNOSIS — E43 Unspecified severe protein-calorie malnutrition: Secondary | ICD-10-CM

## 2015-06-14 LAB — BASIC METABOLIC PANEL
Anion gap: 9 (ref 5–15)
BUN: 33 mg/dL — ABNORMAL HIGH (ref 6–20)
CALCIUM: 8.4 mg/dL — AB (ref 8.9–10.3)
CHLORIDE: 112 mmol/L — AB (ref 101–111)
CO2: 25 mmol/L (ref 22–32)
CREATININE: 1.28 mg/dL — AB (ref 0.44–1.00)
GFR, EST AFRICAN AMERICAN: 44 mL/min — AB (ref >60)
GFR, EST NON AFRICAN AMERICAN: 38 mL/min — AB (ref >60)
Glucose, Bld: 84 mg/dL (ref 65–99)
Potassium: 3.6 mmol/L (ref 3.5–5.1)
SODIUM: 146 mmol/L — AB (ref 135–145)

## 2015-06-14 LAB — CBC
HCT: 31.9 % — ABNORMAL LOW (ref 36.0–46.0)
Hemoglobin: 9.6 g/dL — ABNORMAL LOW (ref 12.0–15.0)
MCH: 27.9 pg (ref 26.0–34.0)
MCHC: 30.1 g/dL (ref 30.0–36.0)
MCV: 92.7 fL (ref 78.0–100.0)
PLATELETS: 176 10*3/uL (ref 150–400)
RBC: 3.44 MIL/uL — AB (ref 3.87–5.11)
RDW: 19.1 % — ABNORMAL HIGH (ref 11.5–15.5)
WBC: 7.1 10*3/uL (ref 4.0–10.5)

## 2015-06-14 LAB — PROTIME-INR
INR: 1.64 — ABNORMAL HIGH (ref 0.00–1.49)
Prothrombin Time: 19.5 seconds — ABNORMAL HIGH (ref 11.6–15.2)

## 2015-06-14 LAB — MAGNESIUM: MAGNESIUM: 2 mg/dL (ref 1.7–2.4)

## 2015-06-14 LAB — VANCOMYCIN, TROUGH: VANCOMYCIN TR: 19 ug/mL (ref 10.0–20.0)

## 2015-06-14 LAB — HEPARIN LEVEL (UNFRACTIONATED): HEPARIN UNFRACTIONATED: 0.16 [IU]/mL — AB (ref 0.30–0.70)

## 2015-06-14 MED ORDER — HEPARIN (PORCINE) IN NACL 100-0.45 UNIT/ML-% IJ SOLN
900.0000 [IU]/h | INTRAMUSCULAR | Status: DC
  Administered 2015-06-14: 900 [IU]/h via INTRAVENOUS
  Administered 2015-06-15: 1100 [IU]/h via INTRAVENOUS
  Administered 2015-06-17 – 2015-06-19 (×3): 900 [IU]/h via INTRAVENOUS
  Filled 2015-06-14 (×6): qty 250

## 2015-06-14 NOTE — Progress Notes (Signed)
Pharmacy Antibiotic Note  Kristina Diaz is a 82 y.o. female admitted on 06/10/2015 with weakness and fever since 2/8. LLE infected ulcer w/ cellulitis, possible gangrene. Pharmacy has been consulted for vancomycin and zosyn for severe sepsis - Day #5. Afeb, wbc wnl. LA trend down to 1.31. SCr 1.4>>1.18>>1.28, CrCL~31.  - vanc trough 19 (~22.5 hr level).  Plan: -Zosyn 3.375g IV q8h -Continue vancomycin  IV q24h -follow renal function, cultures, clinical course -VT@SS  as needed  Height:  (162.6 cm) Weight: 147 lb 14.9 oz (67.1 kg) IBW/kg (Calculated) : 54.7  Temp (24hrs), Avg:98 F (36.7 C), Min:97.8 F (36.6 C), Max:98.1 F (36.7 C)   Recent Labs Lab 06/10/15 1421 06/10/15 1432 06/10/15 1825 06/11/15 0419 06/12/15 1655 06/13/15 0630 06/14/15 0833 06/14/15 1431  WBC 9.1  --   --  10.0  --  9.8 7.1  --   CREATININE 1.78*  --   --  1.40* 1.34* 1.18* 1.28*  --   LATICACIDVEN  --  1.93 1.31  --   --   --   --   --   VANCOTROUGH  --   --   --   --   --   --   --  19    Estimated Creatinine Clearance: 31.4 mL/min (by C-G formula based on Cr of 1.28).    Allergies  Allergen Reactions  . Bactrim Ds [Sulfamethoxazole-Trimethoprim] Rash  . Contrast Media [Iodinated Diagnostic Agents] Hives    BLE erythema   . Ciprofloxacin Rash  . Tape Rash    Antimicrobials this admission: 2/13 vanc >> 2/13 zosyn >>  Dose adjustments this admission: n/a  Microbiology results: 2/13 BCx: ngtd 2/13 UCx: ngf 2/14 MRSA pcr: neg  Thank you for allowing [place] to participate in this patients care. Signe Colt, PharmD Pager: [phone] 06/14/2015 3:40 PM

## 2015-06-14 NOTE — Progress Notes (Signed)
ANTICOAGULATION CONSULT NOTE  Pharmacy Consult for Heparin Indication: atrial fibrillation and DVT  Allergies  Allergen Reactions  . Bactrim Ds [Sulfamethoxazole-Trimethoprim] Rash  . Contrast Media [Iodinated Diagnostic Agents] Hives    BLE erythema   . Ciprofloxacin Rash  . Tape Rash   Patient Measurements: Height:  (162.6 cm) Weight: 131 lb 4.8 oz (59.557 kg) IBW/kg (Calculated) : 54.7 Heparin Dosing Weight: actual weight  Vital Signs: Temp: 98.1 F (36.7 C) (02/17 1537) Temp Source: Oral (02/17 1537) BP: 85/41 mmHg (02/17 1537) Pulse Rate: 96 (02/17 1537)  Labs:  Recent Labs  06/12/15 1655 06/13/15 0630 06/14/15 0614 06/14/15 0833 06/14/15 1839  HGB  --  9.2*  --  9.6*  --   HCT  --  29.5*  --  31.9*  --   PLT  --  190  --  176  --   LABPROT 47.9* 25.5* 19.5*  --   --   INR 5.36* 2.35* 1.64*  --   --   HEPARINUNFRC  --   --   --   --  0.16*  CREATININE 1.34* 1.18*  --  1.28*  --    Estimated Creatinine Clearance: 28.8 mL/min (by C-G formula based on Cr of 1.28).  Medical History: Past Medical History  Diagnosis Date  . PAD (peripheral artery disease) (HCC) 2013    left leg. sounds like she had angio-plasty   . Atrial fibrillation (HCC) 2013    s/p [facility] that failed. ON medication  . Hypertension   . Osteoporosis   . DVT (deep venous thrombosis) (HCC) 2013    "LLE" (08/04/2012)  . Hypothyroidism   . History of blood transfusion 2013    "related to thrombectomy" (08/04/2012)  . Anemia     "slightly" (08/04/2012)  . Arthritis     "probably minor; knees, pinky" (08/04/2012)  . Fracture of L4 vertebra (HCC)     "dx'd in 2013; can't treat til legs fixed" (08/04/2012)  . Lower extremity ulceration (HCC)   . Biventricular heart failure with reduced left ventricular function (HCC)    Medications:  Scheduled:  . aspirin EC  81 mg Oral Daily  . bisacodyl  10 mg Rectal Once  . collagenase   Topical Daily  . docusate sodium  100 mg Oral Daily  . feeding  supplement (ENSURE ENLIVE)  237 mL Oral TID BM  . levothyroxine  37.5 mcg Oral QAC breakfast  . lisinopril  2.5 mg Oral Daily  . metoprolol succinate  12.5 mg Oral Daily  . multivitamin with minerals  1 tablet Oral q morning - 10a  . oxyCODONE  10 mg Oral Q12H  . piperacillin-tazobactam (ZOSYN)  IV  3.375 g Intravenous Q8H  . sodium chloride flush  3 mL Intravenous Q12H  . torsemide  20 mg Oral Daily  . vancomycin  750 mg Intravenous Q24H    Assessment: 55 yoF admitted on 2/13 with fever, weakness, and sepsis.  PMH includes chronic warfarin anticoagulation for afib and DVT.  INR 5.1 on admission is supratherapeutic and warfarin is held.  Pharmacy is consulted to begin Heparin IV when INR < 2. INR peak 5.8, now down to 1.64 s/p 5 mg dose vit k on 2/15 and 2/16. Hg low 9.2, plt wnl, no bleed documented. Planning for another IR procedure for limb salvage vs. Amputation. Heparin drip 900 uts/hr HL 0.23 < goal, no bleeding noted  Goal of Therapy:  Heparin level 0.3-0.7 units/ml Monitor platelets by anticoagulation protocol: Yes  Plan:  Start heparin IV 1100 units/hr  Daily INR and heparin level Hold warfarin, monitor s/sx bleeding F/u plans for procedures  Leota Sauers Pharm.D. CPP, BCPS Clinical Pharmacist [phone] 06/14/2015 8:09 PM  '

## 2015-06-14 NOTE — Progress Notes (Signed)
No change in d/c plan; awaiting stability to return to [facility].  CSW services will continue to monitor and assist as needed with d/c.  Lorri Frederick. Jaci Lazier, [place] [phone]

## 2015-06-14 NOTE — Progress Notes (Addendum)
[facility] PROGRESS NOTE  Kristina Diaz NMM:[id number] DOB: [birth year] DOA: 06/10/2015 PCP: Nyoka Cowden, MD  Brief Summary  Chart reviewed. The patient is a 82 y.o. year-old female with history of atrial fibrillation, PVD, heart failure, LLE DVT, chronic left leg wound who presents with fever and weakness. The patient has a history of nonhealing ulcers of the left lower extremity and underwent left popliteal and tibial revascularization with PTA of the popliteal, anterior tibial, and peroneal arteries by Dr. Earleen Newport on 05/09/2015.  Dr. Earleen Newport also suspects she has significant venous disease contributing to her worsening left leg wounds.  She has been started on broad-spectrum antibiotics while she thinks about the possibility of undergoing a venogram with intervention for limb salvage.    Assessment/Plan  Severe sepsis (fever to 101F at facility, tachycardia), possibly due to left lower extremity infected ulcer with surrounding cellulitis. Known peripheral arterial disease and venous disease.  Hemodynamically stable- Blood cultures NGTD - ESR 77 and CRP 16.1 - d/c IVF - UA neg - Continue vancomycin and zosyn day 5  Peripheral arterial disease s/p recent angioplasty of LLE 04/2015 by Dr. Earleen Newport - Continue aspirin - Heparin gtt now that INR below 2 Dr. Earleen Newport who recommends venogram to evaluate for venous obstruction. D/w Earleen Newport. Can't schedule until Monday. Will start diet.   Chronic atrial fibrillation, now rate controlled - Hold warfarin. Resume after procedure(s) done  Chronic diastolic and systolic heart failure, echocardiogram this admission shows ejection fraction of 35%. Has range 30-40% in the past. resumed torsemide but once a day rather than twice daily. Add low-dose lisinopril and monitor be bmets. Weights are stable.  AKI,  creatinine 1.4 on admission. 1.2  Kyphosis, severe compression fracture of L4  Hypothyroidism, stable, continue  sythroid  Severe protein calorie malnutrition: dietitian following  Code Status: DNR Family Communication: patient alone Disposition Plan:  Pending decision about venous intervention, and decrease of INR  Consultants:  Dr. Earleen Newport, IR  Procedures:  Duplex left leg, venous  Antibiotics:  vanc and zosyn 2/13 >    HPI/Subjective:  Hungry. No dyspnea. No bleeding  Objective: Filed Vitals:   06/13/15 1456 06/13/15 2140 06/14/15 0611 06/14/15 1014  BP: 120/74 90/56 94/49  98/45  Pulse:  101 94 87  Temp:   97.8 F (36.6 C)   TempSrc:   Oral   Resp:  20 20   Height:      Weight:   67.1 kg (147 lb 14.9 oz)   SpO2:  97% 97%     Intake/Output Summary (Last 24 hours) at 06/14/15 1110 Last data filed at 06/14/15 0555  Gross per 24 hour  Intake    372 ml  Output    400 ml  Net    -28 ml   Filed Weights   06/10/15 1311 06/10/15 1420 06/14/15 0611  Weight: 56.246 kg (124 lb) 56.246 kg (124 lb) 67.1 kg (147 lb 14.9 oz)   Body mass index is 25.38 kg/(m^2).  Exam:   General:  A and o, nontoxic appearing,   Cardiovascular:  irreg irreg. Murmur present  Respiratory:  CTAB, no increased WOB  Abdomen:   NABS, soft, NT/ND  MSK:   Trace edema.  Foul odor persists.  Ace bandage in place   Data Reviewed: Basic Metabolic Panel:  Recent Labs Lab 06/10/15 1421 06/11/15 0419 06/12/15 1655 06/13/15 0630 06/14/15 0833  NA 143 145 144 144 146*  K 4.6 4.7 4.6 4.6 3.6  CL 107 112* 109 112* 112*  CO2  26 22 24 22 25   GLUCOSE 127* 130* 174* 96 84  BUN 50* 41* 43* 39* 33*  CREATININE 1.78* 1.40* 1.34* 1.18* 1.28*  CALCIUM 8.6* 8.0* 8.9 8.9 8.4*  MG  --   --   --   --  2.0   Liver Function Tests:  Recent Labs Lab 06/10/15 1421  AST 37  ALT 22  ALKPHOS 70  BILITOT 1.3*  PROT 7.2  ALBUMIN 2.8*   No results for input(s): LIPASE, AMYLASE in the last 168 hours. No results for input(s): AMMONIA in the last 168 hours. CBC:  Recent Labs Lab 06/10/15 1421  06/11/15 0419 06/13/15 0630 06/14/15 0833  WBC 9.1 10.0 9.8 7.1  NEUTROABS 6.6  --   --   --   HGB 9.4* 9.6* 9.2* 9.6*  HCT 30.2* 31.6* 29.5* 31.9*  MCV 94.1 95.8 91.9 92.7  PLT 176 155 190 176    Recent Results (from the past 240 hour(s))  Culture, blood (routine x 2)     Status: None (Preliminary result)   Collection Time: 06/10/15  2:20 PM  Result Value Ref Range Status   Specimen Description BLOOD RIGHT FOREARM  Final   Special Requests BOTTLES DRAWN AEROBIC AND ANAEROBIC 5 CC EA  Final   Culture   Final    NO GROWTH 3 DAYS Performed at [facility]    Report Status PENDING  Incomplete  Culture, blood (routine x 2)     Status: None (Preliminary result)   Collection Time: 06/10/15  2:25 PM  Result Value Ref Range Status   Specimen Description BLOOD RIGHT ANTECUBITAL  Final   Special Requests BOTTLES DRAWN AEROBIC AND ANAEROBIC 5 CC EA  Final   Culture   Final    NO GROWTH 3 DAYS Performed at [facility]    Report Status PENDING  Incomplete  Urine culture     Status: None   Collection Time: 06/11/15  8:47 AM  Result Value Ref Range Status   Specimen Description URINE, CATHETERIZED  Final   Special Requests NONE  Final   Culture   Final    NO GROWTH 1 DAY Performed at [facility]    Report Status 06/12/2015 FINAL  Final  MRSA PCR Screening     Status: None   Collection Time: 06/11/15 10:50 PM  Result Value Ref Range Status   MRSA by PCR NEGATIVE NEGATIVE Final    Comment:        The GeneXpert MRSA Assay (FDA approved for NASAL specimens only), is one component of a comprehensive MRSA colonization surveillance program. It is not intended to diagnose MRSA infection nor to guide or monitor treatment for MRSA infections.      Studies: No results found.  Echo Left ventricle: The cavity size was normal. Systolic function was moderately reduced. The estimated ejection fraction was in the range of 35% to 40%. Diffuse  hypokinesis. - Ventricular septum: Septal motion showed paradox. - Aortic valve: There was mild regurgitation. - Mitral valve: Calcified annulus. Mildly thickened leaflets . There was moderate regurgitation. - Left atrium: The atrium was mildly dilated. - Right ventricle: The cavity size was moderately dilated. Wall thickness was normal. Systolic function was moderately reduced. - Right atrium: The atrium was moderately dilated. - Tricuspid valve: There was moderate regurgitation. - Pulmonic valve: There was moderate regurgitation. - Pulmonary arteries: Systolic pressure was moderately increased. PA peak pressure: 46 mm Hg (S).  Scheduled Meds: . aspirin EC  81  mg Oral Daily  . bisacodyl  10 mg Rectal Once  . collagenase   Topical Daily  . docusate sodium  100 mg Oral Daily  . feeding supplement (ENSURE ENLIVE)  237 mL Oral TID BM  . levothyroxine  37.5 mcg Oral QAC breakfast  . lisinopril  2.5 mg Oral Daily  . metoprolol succinate  12.5 mg Oral Daily  . multivitamin with minerals  1 tablet Oral q morning - 10a  . oxyCODONE  10 mg Oral Q12H  . piperacillin-tazobactam (ZOSYN)  IV  3.375 g Intravenous Q8H  . sodium chloride flush  3 mL Intravenous Q12H  . torsemide  20 mg Oral Daily  . vancomycin  750 mg Intravenous Q24H   Continuous Infusions: . heparin 900 Units/hr (06/14/15 1003)    Active Problems:   PAD (peripheral artery disease) (HCC)   Atrial fibrillation (HCC)   Hypothyroidism   Long term (current) use of anticoagulants   Sepsis ([place])   Leg ulcer, left (HCC)   Chronic systolic CHF (congestive heart failure) (HCC)   Cellulitis of left lower extremity   Critical lower limb ischemia   Hypotension   AKI (acute kidney injury) ([place])   Protein-calorie malnutrition, severe    Time spent: 25 min    [facility]  [email or web address], password [facility] 06/14/2015, 11:10 AM  LOS: 4 days

## 2015-06-14 NOTE — Progress Notes (Signed)
ANTICOAGULATION CONSULT NOTE  Pharmacy Consult for Heparin Indication: atrial fibrillation and DVT  Allergies  Allergen Reactions  . Bactrim Ds [Sulfamethoxazole-Trimethoprim] Rash  . Contrast Media [Iodinated Diagnostic Agents] Hives    BLE erythema   . Ciprofloxacin Rash  . Tape Rash   Patient Measurements: Height:  (162.6 cm) Weight: 147 lb 14.9 oz (67.1 kg) IBW/kg (Calculated) : 54.7 Heparin Dosing Weight: actual weight  Vital Signs: Temp: 97.8 F (36.6 C) (02/17 0611) Temp Source: Oral (02/17 0611) BP: 94/49 mmHg (02/17 0611) Pulse Rate: 94 (02/17 0611)  Labs:  Recent Labs  06/12/15 1655 06/13/15 0630 06/14/15 0614  HGB  --  9.2*  --   HCT  --  29.5*  --   PLT  --  190  --   LABPROT 47.9* 25.5* 19.5*  INR 5.36* 2.35* 1.64*  CREATININE 1.34* 1.18*  --    Estimated Creatinine Clearance: 34 mL/min (by C-G formula based on Cr of 1.18).  Medical History: Past Medical History  Diagnosis Date  . PAD (peripheral artery disease) (HCC) 2013    left leg. sounds like she had angio-plasty   . Atrial fibrillation (HCC) 2013    s/p [facility] that failed. ON medication  . Hypertension   . Osteoporosis   . DVT (deep venous thrombosis) (HCC) 2013    "LLE" (08/04/2012)  . Hypothyroidism   . History of blood transfusion 2013    "related to thrombectomy" (08/04/2012)  . Anemia     "slightly" (08/04/2012)  . Arthritis     "probably minor; knees, pinky" (08/04/2012)  . Fracture of L4 vertebra (HCC)     "dx'd in 2013; can't treat til legs fixed" (08/04/2012)  . Lower extremity ulceration (HCC)   . Biventricular heart failure with reduced left ventricular function (HCC)    Medications:  Scheduled:  . aspirin EC  81 mg Oral Daily  . bisacodyl  10 mg Rectal Once  . collagenase   Topical Daily  . docusate sodium  100 mg Oral Daily  . feeding supplement (ENSURE ENLIVE)  237 mL Oral TID BM  . levothyroxine  37.5 mcg Oral QAC breakfast  . lisinopril  2.5 mg Oral Daily  .  metoprolol succinate  12.5 mg Oral Daily  . multivitamin with minerals  1 tablet Oral q morning - 10a  . oxyCODONE  10 mg Oral Q12H  . piperacillin-tazobactam (ZOSYN)  IV  3.375 g Intravenous Q8H  . sodium chloride flush  3 mL Intravenous Q12H  . torsemide  20 mg Oral Daily  . vancomycin  750 mg Intravenous Q24H    Assessment: 38 yoF admitted on 2/13 with fever, weakness, and sepsis.  PMH includes chronic warfarin anticoagulation for afib and DVT.  INR 5.1 on admission is supratherapeutic and warfarin is held.  Pharmacy is consulted to begin Heparin IV when INR < 2. INR peak 5.8, now down to 1.64 s/p 5 mg dose vit k on 2/15 and 2/16. Hg low 9.2, plt wnl, no bleed documented. Planning for another IR procedure for limb salvage vs. Amputation.  Goal of Therapy:  Heparin level 0.3-0.7 units/ml Monitor platelets by anticoagulation protocol: Yes   Plan:  Start heparin IV 900 units/hr and check 8 hour level Daily INR and heparin level Hold warfarin, monitor s/sx bleeding F/u plans for procedures  Thank you for allowing [place] to participate in this patients care. Signe Colt, PharmD Pager: [phone]  06/14/2015 8:39 AM

## 2015-06-15 LAB — HEPARIN LEVEL (UNFRACTIONATED)
HEPARIN UNFRACTIONATED: 0.46 [IU]/mL (ref 0.30–0.70)
HEPARIN UNFRACTIONATED: 0.6 [IU]/mL (ref 0.30–0.70)

## 2015-06-15 LAB — CULTURE, BLOOD (ROUTINE X 2)
CULTURE: NO GROWTH
Culture: NO GROWTH

## 2015-06-15 LAB — CBC
HEMATOCRIT: 31.2 % — AB (ref 36.0–46.0)
HEMOGLOBIN: 9.5 g/dL — AB (ref 12.0–15.0)
MCH: 28.7 pg (ref 26.0–34.0)
MCHC: 30.4 g/dL (ref 30.0–36.0)
MCV: 94.3 fL (ref 78.0–100.0)
Platelets: 171 10*3/uL (ref 150–400)
RBC: 3.31 MIL/uL — AB (ref 3.87–5.11)
RDW: 19.3 % — ABNORMAL HIGH (ref 11.5–15.5)
WBC: 8.1 10*3/uL (ref 4.0–10.5)

## 2015-06-15 LAB — BASIC METABOLIC PANEL
ANION GAP: 8 (ref 5–15)
BUN: 34 mg/dL — AB (ref 6–20)
CALCIUM: 8.5 mg/dL — AB (ref 8.9–10.3)
CO2: 26 mmol/L (ref 22–32)
CREATININE: 1.43 mg/dL — AB (ref 0.44–1.00)
Chloride: 110 mmol/L (ref 101–111)
GFR calc Af Amer: 38 mL/min — ABNORMAL LOW (ref >60)
GFR calc non Af Amer: 33 mL/min — ABNORMAL LOW (ref >60)
GLUCOSE: 90 mg/dL (ref 65–99)
Potassium: 3.9 mmol/L (ref 3.5–5.1)
Sodium: 144 mmol/L (ref 135–145)

## 2015-06-15 MED ORDER — METOPROLOL TARTRATE 12.5 MG HALF TABLET
12.5000 mg | ORAL_TABLET | Freq: Two times a day (BID) | ORAL | Status: DC
  Administered 2015-06-15 – 2015-06-20 (×8): 12.5 mg via ORAL
  Filled 2015-06-15 (×11): qty 1

## 2015-06-15 NOTE — Progress Notes (Signed)
[facility] PROGRESS NOTE  Kristina Diaz AJG:[id number] DOB: 06/25/1929 DOA: 06/10/2015 PCP: Nyoka Cowden, MD  Brief Summary  Chart reviewed. The patient is a 82 y.o. year-old female with history of atrial fibrillation, PVD, heart failure, LLE DVT, chronic left leg wound who presents with fever and weakness. The patient has a history of nonhealing ulcers of the left lower extremity and underwent left popliteal and tibial revascularization with PTA of the popliteal, anterior tibial, and peroneal arteries by Dr. Earleen Newport on 05/09/2015.  Dr. Earleen Newport also suspects she has significant venous disease contributing to her worsening left leg wounds.  She has been started on broad-spectrum antibiotics while she thinks about the possibility of undergoing a venogram with intervention for limb salvage.    Assessment/Plan  Severe sepsis (fever to 101F at facility, tachycardia), possibly due to left lower extremity infected ulcer with surrounding cellulitis. Known peripheral arterial disease and venous disease.  Hemodynamically stable- Blood cultures NGTD - ESR 77 and CRP 16.1, - d/c IVF - UA neg - Continue vancomycin and zosyn day 6 -bp soft , hold lisinopril/demadex, continue betablocker for rate control with holding parameters.  Peripheral arterial disease s/p recent angioplasty of LLE 04/2015 by Dr. Earleen Newport - Continue aspirin - Heparin gtt now that INR below 2 Dr. Earleen Newport who recommends venogram to evaluate for venous obstruction. D/w Earleen Newport. Can't schedule until Monday. Will start diet.   Chronic atrial fibrillation, - Hold warfarin. Resume after procedure(s) done -now on heparin drip, in chronic afib, tachycardia at times, bp soft, change long acting betablocker to lopressor with holding parameters, hopefully patient blood pressure able to tolerate betablocker, otherwise, need to consider amiodarone or dig for rate control. Underline sepsis driving tachycardia. Will also check tsh.  Keep Mag>2, k >4  NSVT: patient asymptomatic, continue low dose betablocker for now.  Chronic diastolic and systolic heart failure, echocardiogram this admission shows ejection fraction of 35%. Has range 30-40% in the past. resumed torsemide but once a day rather than twice daily. Weights are stable. 2/18: bp soft, d/c lisinopril, hold torsemide.  H/o left lower extremity DVT s/p thrombectomy in 2014, repeat venous US during this admission on acute DVT,  patient has been on chronic anticoagulation, now on heparin drip for anticipated procedure on monday   AKI,  creatinine 1.4 on admission. 1.2  Kyphosis, severe compression fracture of L4  Hypothyroidism, stable, continue sythroid  Severe protein calorie malnutrition: dietitian following  Code Status: DNR Family Communication: patient alone Disposition Plan:  Pending decision about venous intervention, and decrease of INR  Consultants:  Dr. Earleen Newport, IR  Procedures:  Duplex left leg, venous  Antibiotics:  vanc and zosyn 2/13 >    HPI/Subjective:  NSVT on tele, patient denies chest pain, No dyspnea. On heparin drip, No bleeding, reported feeling better.  Objective: Filed Vitals:   06/15/15 0458 06/15/15 0500 06/15/15 0558 06/15/15 0921  BP: 85/44  100/49 100/42  Pulse: 100  102 75  Temp: 97.7 F (36.5 C)     TempSrc: Oral     Resp: 20     Height:      Weight:  60.1 kg (132 lb 7.9 oz)    SpO2: 98%       Intake/Output Summary (Last 24 hours) at 06/15/15 1151 Last data filed at 06/15/15 0745  Gross per 24 hour  Intake    170 ml  Output    800 ml  Net   -630 ml   Filed Weights   06/14/15 0611 06/14/15 1700  06/15/15 0500  Weight: 67.1 kg (147 lb 14.9 oz) 59.557 kg (131 lb 4.8 oz) 60.1 kg (132 lb 7.9 oz)   Body mass index is 22.73 kg/(m^2).  Exam:   General:  A and o, nontoxic appearing,   Cardiovascular:  irreg irreg. Murmur present  Respiratory:  CTAB, no increased WOB  Abdomen:   NABS, soft,  NT/ND  MSK:   Trace edema.  Foul odor persists.  Ace bandage in place   Data Reviewed: Basic Metabolic Panel:  Recent Labs Lab 06/11/15 0419 06/12/15 1655 06/13/15 0630 06/14/15 0833 06/15/15 0536  NA 145 144 144 146* 144  K 4.7 4.6 4.6 3.6 3.9  CL 112* 109 112* 112* 110  CO2 _0 GLUCOSE 130* 174* 96 84 90  BUN 41* 43* 39* 33* 34*  CREATININE 1.40* 1.34* 1.18* 1.28* 1.43*  CALCIUM 8.0* 8.9 8.9 8.4* 8.5*  MG  --   --   --  2.0  --    Liver Function Tests:  Recent Labs Lab 06/10/15 1421  AST 37  ALT 22  ALKPHOS 70  BILITOT 1.3*  PROT 7.2  ALBUMIN 2.8*   No results for input(s): LIPASE, AMYLASE in the last 168 hours. No results for input(s): AMMONIA in the last 168 hours. CBC:  Recent Labs Lab 06/10/15 1421 06/11/15 0419 06/13/15 0630 06/14/15 0833 06/15/15 0536  WBC 9.1 10.0 9.8 7.1 8.1  NEUTROABS 6.6  --   --   --   --   HGB 9.4* 9.6* 9.2* 9.6* 9.5*  HCT 30.2* 31.6* 29.5* 31.9* 31.2*  MCV 94.1 95.8 91.9 92.7 94.3  PLT 176 155 190 176 171    Recent Results (from the past 240 hour(s))  Culture, blood (routine x 2)     Status: None (Preliminary result)   Collection Time: 06/10/15  2:20 PM  Result Value Ref Range Status   Specimen Description BLOOD RIGHT FOREARM  Final   Special Requests BOTTLES DRAWN AEROBIC AND ANAEROBIC 5 CC EA  Final   Culture   Final    NO GROWTH 4 DAYS Performed at [facility]    Report Status PENDING  Incomplete  Culture, blood (routine x 2)     Status: None (Preliminary result)   Collection Time: 06/10/15  2:25 PM  Result Value Ref Range Status   Specimen Description BLOOD RIGHT ANTECUBITAL  Final   Special Requests BOTTLES DRAWN AEROBIC AND ANAEROBIC 5 CC EA  Final   Culture   Final    NO GROWTH 4 DAYS Performed at [facility]    Report Status PENDING  Incomplete  Urine culture     Status: None   Collection Time: 06/11/15  8:47 AM  Result Value Ref Range Status   Specimen Description  URINE, CATHETERIZED  Final   Special Requests NONE  Final   Culture   Final    NO GROWTH 1 DAY Performed at [facility]    Report Status 06/12/2015 FINAL  Final  MRSA PCR Screening     Status: None   Collection Time: 06/11/15 10:50 PM  Result Value Ref Range Status   MRSA by PCR NEGATIVE NEGATIVE Final    Comment:        The GeneXpert MRSA Assay (FDA approved for NASAL specimens only), is one component of a comprehensive MRSA colonization surveillance program. It is not intended to diagnose MRSA infection nor to guide or monitor treatment for MRSA infections.  Studies: No results found.  Echo Left ventricle: The cavity size was normal. Systolic function was moderately reduced. The estimated ejection fraction was in the range of 35% to 40%. Diffuse hypokinesis. - Ventricular septum: Septal motion showed paradox. - Aortic valve: There was mild regurgitation. - Mitral valve: Calcified annulus. Mildly thickened leaflets . There was moderate regurgitation. - Left atrium: The atrium was mildly dilated. - Right ventricle: The cavity size was moderately dilated. Wall thickness was normal. Systolic function was moderately reduced. - Right atrium: The atrium was moderately dilated. - Tricuspid valve: There was moderate regurgitation. - Pulmonic valve: There was moderate regurgitation. - Pulmonary arteries: Systolic pressure was moderately increased. PA peak pressure: 46 mm Hg (S).  Scheduled Meds: . aspirin EC  81 mg Oral Daily  . bisacodyl  10 mg Rectal Once  . collagenase   Topical Daily  . docusate sodium  100 mg Oral Daily  . feeding supplement (ENSURE ENLIVE)  237 mL Oral TID BM  . levothyroxine  37.5 mcg Oral QAC breakfast  . metoprolol succinate  12.5 mg Oral Daily  . multivitamin with minerals  1 tablet Oral q morning - 10a  . oxyCODONE  10 mg Oral Q12H  . piperacillin-tazobactam (ZOSYN)  IV  3.375 g Intravenous Q8H  . sodium chloride flush   3 mL Intravenous Q12H  . torsemide  20 mg Oral Daily  . vancomycin  750 mg Intravenous Q24H   Continuous Infusions: . heparin 1,100 Units/hr (06/14/15 2126)    Active Problems:   PAD (peripheral artery disease) (HCC)   Atrial fibrillation (HCC)   Hypothyroidism   Long term (current) use of anticoagulants   Sepsis ([place])   Leg ulcer, left (HCC)   Chronic systolic CHF (congestive heart failure) (HCC)   Cellulitis of left lower extremity   Critical lower limb ischemia   Hypotension   AKI (acute kidney injury) ([place])   Protein-calorie malnutrition, severe    Time spent: 59 min    Jaryn Rosko MD PhD [phone]  [facility]  [email or web address], password [facility] 06/15/2015, 11:51 AM  LOS: 5 days

## 2015-06-15 NOTE — Progress Notes (Signed)
ANTICOAGULATION CONSULT NOTE  Pharmacy Consult for Heparin Indication: atrial fibrillation and DVT  Allergies  Allergen Reactions  . Bactrim Ds [Sulfamethoxazole-Trimethoprim] Rash  . Contrast Media [Iodinated Diagnostic Agents] Hives    BLE erythema   . Ciprofloxacin Rash  . Tape Rash   Patient Measurements: Height:  (162.6 cm) Weight: 132 lb 7.9 oz (60.1 kg) IBW/kg (Calculated) : 54.7 Heparin Dosing Weight: actual weight  Vital Signs: Temp: 97.7 F (36.5 C) (02/18 0458) Temp Source: Oral (02/18 0458) BP: 100/42 mmHg (02/18 0921) Pulse Rate: 75 (02/18 0921)  Labs:  Recent Labs  06/12/15 1655  06/13/15 0630 06/14/15 0614 06/14/15 0833 06/14/15 1839 06/15/15 0536  HGB  --   < > 9.2*  --  9.6*  --  9.5*  HCT  --   --  29.5*  --  31.9*  --  31.2*  PLT  --   --  190  --  176  --  171  LABPROT 47.9*  --  25.5* 19.5*  --   --   --   INR 5.36*  --  2.35* 1.64*  --   --   --   HEPARINUNFRC  --   --   --   --   --  0.16* 0.46  CREATININE 1.34*  --  1.18*  --  1.28*  --  1.43*  < > = values in this interval not displayed. Estimated Creatinine Clearance: 25.7 mL/min (by C-G formula based on Cr of 1.43).  Medical History: Past Medical History  Diagnosis Date  . PAD (peripheral artery disease) (HCC) 2013    left leg. sounds like she had angio-plasty   . Atrial fibrillation (HCC) 2013    s/p [facility] that failed. ON medication  . Hypertension   . Osteoporosis   . DVT (deep venous thrombosis) (HCC) 2013    "LLE" (08/04/2012)  . Hypothyroidism   . History of blood transfusion 2013    "related to thrombectomy" (08/04/2012)  . Anemia     "slightly" (08/04/2012)  . Arthritis     "probably minor; knees, pinky" (08/04/2012)  . Fracture of L4 vertebra (HCC)     "dx'd in 2013; can't treat til legs fixed" (08/04/2012)  . Lower extremity ulceration (HCC)   . Biventricular heart failure with reduced left ventricular function (HCC)    Medications:  Scheduled:  . aspirin EC  81  mg Oral Daily  . bisacodyl  10 mg Rectal Once  . collagenase   Topical Daily  . docusate sodium  100 mg Oral Daily  . feeding supplement (ENSURE ENLIVE)  237 mL Oral TID BM  . levothyroxine  37.5 mcg Oral QAC breakfast  . lisinopril  2.5 mg Oral Daily  . metoprolol succinate  12.5 mg Oral Daily  . multivitamin with minerals  1 tablet Oral q morning - 10a  . oxyCODONE  10 mg Oral Q12H  . piperacillin-tazobactam (ZOSYN)  IV  3.375 g Intravenous Q8H  . sodium chloride flush  3 mL Intravenous Q12H  . torsemide  20 mg Oral Daily  . vancomycin  750 mg Intravenous Q24H    Assessment: 66 yoF admitted on 2/13 with fever, weakness, and sepsis.  PMH includes chronic warfarin anticoagulation for afib and DVT.  INR 5.1 on admission is supratherapeutic and warfarin is held.  Pharmacy is consulted to begin Heparin IV when INR < 2. INR peak 5.8, now down to 1.64 s/p 2 x 5 mg dose vit k on 2/15 and 2/16.  Hg low 9.5, plt wnl, no bleed documented. Planning for another IR procedure for limb salvage vs. Amputation. Heparin level 0.16 on 900 uts/hr Heparin level 0.46 on 1100 un/hr, no bleeding noted  Goal of Therapy:  Heparin level 0.3-0.7 units/ml Monitor platelets by anticoagulation protocol: Yes   Plan:  Continue heparin IV 1100 units/hr and check confirmatory 8 hour level Daily INR and heparin level Hold warfarin, monitor s/sx bleeding F/u plans for procedures  Thank you for allowing [place] to participate in this patients care. Signe Colt, PharmD Pager: [phone] 06/15/2015 11:24 AM

## 2015-06-15 NOTE — Progress Notes (Signed)
ANTICOAGULATION CONSULT NOTE  Pharmacy Consult for Heparin Indication: atrial fibrillation and DVT  Allergies  Allergen Reactions  . Bactrim Ds [Sulfamethoxazole-Trimethoprim] Rash  . Contrast Media [Iodinated Diagnostic Agents] Hives    BLE erythema   . Ciprofloxacin Rash  . Tape Rash   Patient Measurements: Height:  (162.6 cm) Weight: 132 lb 7.9 oz (60.1 kg) IBW/kg (Calculated) : 54.7 Heparin Dosing Weight: actual weight  Vital Signs: Temp: 98.2 F (36.8 C) (02/18 1314) Temp Source: Oral (02/18 1314) BP: 99/50 mmHg (02/18 1314) Pulse Rate: 92 (02/18 1314)  Labs:  Recent Labs  06/12/15 1655  06/13/15 0630 06/14/15 0614 06/14/15 0833 06/14/15 1839 06/15/15 0536 06/15/15 1324  HGB  --   < > 9.2*  --  9.6*  --  9.5*  --   HCT  --   --  29.5*  --  31.9*  --  31.2*  --   PLT  --   --  190  --  176  --  171  --   LABPROT 47.9*  --  25.5* 19.5*  --   --   --   --   INR 5.36*  --  2.35* 1.64*  --   --   --   --   HEPARINUNFRC  --   --   --   --   --  0.16* 0.46 0.60  CREATININE 1.34*  --  1.18*  --  1.28*  --  1.43*  --   < > = values in this interval not displayed. Estimated Creatinine Clearance: 25.7 mL/min (by C-G formula based on Cr of 1.43).  Medical History: Past Medical History  Diagnosis Date  . PAD (peripheral artery disease) (HCC) 2013    left leg. sounds like she had angio-plasty   . Atrial fibrillation (HCC) 2013    s/p [facility] that failed. ON medication  . Hypertension   . Osteoporosis   . DVT (deep venous thrombosis) (HCC) 2013    "LLE" (08/04/2012)  . Hypothyroidism   . History of blood transfusion 2013    "related to thrombectomy" (08/04/2012)  . Anemia     "slightly" (08/04/2012)  . Arthritis     "probably minor; knees, pinky" (08/04/2012)  . Fracture of L4 vertebra (HCC)     "dx'd in 2013; can't treat til legs fixed" (08/04/2012)  . Lower extremity ulceration (HCC)   . Biventricular heart failure with reduced left ventricular function (HCC)     Medications:  Scheduled:  . aspirin EC  81 mg Oral Daily  . bisacodyl  10 mg Rectal Once  . collagenase   Topical Daily  . docusate sodium  100 mg Oral Daily  . feeding supplement (ENSURE ENLIVE)  237 mL Oral TID BM  . levothyroxine  37.5 mcg Oral QAC breakfast  . metoprolol tartrate  12.5 mg Oral BID  . multivitamin with minerals  1 tablet Oral q morning - 10a  . oxyCODONE  10 mg Oral Q12H  . piperacillin-tazobactam (ZOSYN)  IV  3.375 g Intravenous Q8H  . sodium chloride flush  3 mL Intravenous Q12H  . vancomycin  750 mg Intravenous Q24H    Assessment: 85 yoF admitted on 2/13 with fever, weakness, and sepsis.  PMH includes chronic warfarin anticoagulation for afib and DVT.  INR 5.1 on admission is supratherapeutic and warfarin is held.  Pharmacy is consulted to begin Heparin IV when INR < 2. INR peak 5.8, now down to 1.64 s/p 2 x 5 mg dose vit  k on 2/15 and 2/16. Hg low 9.5, plt wnl, no bleed documented. Planning for another IR procedure for limb salvage vs. Amputation. Heparin level 0.16 on 900 uts/hr Heparin level 0.46 and 0.6 on 1100 un/hr, no bleeding noted  Goal of Therapy:  Heparin level 0.3-0.7 units/ml Monitor platelets by anticoagulation protocol: Yes   Plan:  Continue heparin IV 1100 units/hr Daily INR and heparin level Hold warfarin, monitor s/sx bleeding F/u plans for procedures  Thank you for allowing [place] to participate in this patients care. Signe Colt, PharmD Pager: [phone] 06/15/2015 2:43 PM

## 2015-06-15 NOTE — Progress Notes (Signed)
Central Telemetry informed RN that pt had 7 beats of v-tach. RN assessed pt, pt is asymptomatic. RN made MD aware. Tobin Chad 06/15/2015 11:58 AM

## 2015-06-16 DIAGNOSIS — I998 Other disorder of circulatory system: Secondary | ICD-10-CM

## 2015-06-16 LAB — CBC
HEMATOCRIT: 31 % — AB (ref 36.0–46.0)
HEMOGLOBIN: 9.4 g/dL — AB (ref 12.0–15.0)
MCH: 28.7 pg (ref 26.0–34.0)
MCHC: 30.3 g/dL (ref 30.0–36.0)
MCV: 94.5 fL (ref 78.0–100.0)
PLATELETS: 178 10*3/uL (ref 150–400)
RBC: 3.28 MIL/uL — AB (ref 3.87–5.11)
RDW: 19.5 % — ABNORMAL HIGH (ref 11.5–15.5)
WBC: 7.5 10*3/uL (ref 4.0–10.5)

## 2015-06-16 LAB — HEPARIN LEVEL (UNFRACTIONATED)
HEPARIN UNFRACTIONATED: 0.73 [IU]/mL — AB (ref 0.30–0.70)
Heparin Unfractionated: 0.75 IU/mL — ABNORMAL HIGH (ref 0.30–0.70)

## 2015-06-16 LAB — BASIC METABOLIC PANEL
ANION GAP: 9 (ref 5–15)
BUN: 29 mg/dL — AB (ref 6–20)
CHLORIDE: 109 mmol/L (ref 101–111)
CO2: 26 mmol/L (ref 22–32)
Calcium: 8.5 mg/dL — ABNORMAL LOW (ref 8.9–10.3)
Creatinine, Ser: 1.32 mg/dL — ABNORMAL HIGH (ref 0.44–1.00)
GFR calc Af Amer: 42 mL/min — ABNORMAL LOW (ref >60)
GFR calc non Af Amer: 36 mL/min — ABNORMAL LOW (ref >60)
GLUCOSE: 87 mg/dL (ref 65–99)
POTASSIUM: 3.7 mmol/L (ref 3.5–5.1)
Sodium: 144 mmol/L (ref 135–145)

## 2015-06-16 LAB — MAGNESIUM: Magnesium: 2.1 mg/dL (ref 1.7–2.4)

## 2015-06-16 LAB — TSH: TSH: 3.597 u[IU]/mL (ref 0.350–4.500)

## 2015-06-16 MED ORDER — DIPHENHYDRAMINE-ZINC ACETATE 2-0.1 % EX CREA
TOPICAL_CREAM | Freq: Three times a day (TID) | CUTANEOUS | Status: DC | PRN
  Administered 2015-06-16: 1 via TOPICAL
  Administered 2015-06-17: 18:00:00 via TOPICAL
  Filled 2015-06-16: qty 28

## 2015-06-16 NOTE — Progress Notes (Signed)
ANTICOAGULATION CONSULT NOTE  Pharmacy Consult for Heparin Indication: atrial fibrillation and DVT  Allergies  Allergen Reactions  . Bactrim Ds [Sulfamethoxazole-Trimethoprim] Rash  . Contrast Media [Iodinated Diagnostic Agents] Hives    BLE erythema   . Ciprofloxacin Rash  . Tape Rash   Patient Measurements: Height:  (162.6 cm) Weight: 149 lb 4 oz (67.7 kg) IBW/kg (Calculated) : 54.7 Heparin Dosing Weight: actual weight  Vital Signs: Temp: 98 F (36.7 C) (02/19 0559) Temp Source: Oral (02/19 0559) BP: 90/36 mmHg (02/19 0917) Pulse Rate: 88 (02/19 0917)  Labs:  Recent Labs  06/14/15 1610  06/14/15 9604  06/15/15 0536 06/15/15 1324 06/16/15 0524  HGB  --   < > 9.6*  --  9.5*  --  9.4*  HCT  --   --  31.9*  --  31.2*  --  31.0*  PLT  --   --  176  --  171  --  178  LABPROT 19.5*  --   --   --   --   --   --   INR 1.64*  --   --   --   --   --   --   HEPARINUNFRC  --   --   --   < > 0.46 0.60 0.75*  CREATININE  --   --  1.28*  --  1.43*  --  1.32*  < > = values in this interval not displayed. Estimated Creatinine Clearance: 30.5 mL/min (by C-G formula based on Cr of 1.32).  Medical History: Past Medical History  Diagnosis Date  . PAD (peripheral artery disease) (HCC) 2013    left leg. sounds like she had angio-plasty   . Atrial fibrillation (HCC) 2013    s/p [facility] that failed. ON medication  . Hypertension   . Osteoporosis   . DVT (deep venous thrombosis) (HCC) 2013    "LLE" (08/04/2012)  . Hypothyroidism   . History of blood transfusion 2013    "related to thrombectomy" (08/04/2012)  . Anemia     "slightly" (08/04/2012)  . Arthritis     "probably minor; knees, pinky" (08/04/2012)  . Fracture of L4 vertebra (HCC)     "dx'd in 2013; can't treat til legs fixed" (08/04/2012)  . Lower extremity ulceration (HCC)   . Biventricular heart failure with reduced left ventricular function (HCC)    Medications:  Scheduled:  . aspirin EC  81 mg Oral Daily  .  bisacodyl  10 mg Rectal Once  . collagenase   Topical Daily  . docusate sodium  100 mg Oral Daily  . feeding supplement (ENSURE ENLIVE)  237 mL Oral TID BM  . levothyroxine  37.5 mcg Oral QAC breakfast  . metoprolol tartrate  12.5 mg Oral BID  . multivitamin with minerals  1 tablet Oral q morning - 10a  . oxyCODONE  10 mg Oral Q12H  . piperacillin-tazobactam (ZOSYN)  IV  3.375 g Intravenous Q8H  . sodium chloride flush  3 mL Intravenous Q12H  . vancomycin  750 mg Intravenous Q24H    Assessment: 85 yoF admitted on 2/13 with fever, weakness, and sepsis.  PMH includes chronic warfarin anticoagulation for afib and DVT. Pharmacy consulted to dose Heparin IV. INR peak 5.8. Hg 9.4 stable, plt wnl, no bleed documented. Planning for another IR procedure for limb salvage vs. Amputation. Heparin level 0.16 on 900 uts/hr Heparin level 0.46, 0.6, and now 0.75 this morning (now supratherapeutic) on 1100 un/hr, no bleeding noted  Goal of Therapy:  Heparin level 0.3-0.7 units/ml Monitor platelets by anticoagulation protocol: Yes   Plan:  Decrease heparin IV to 1000 units/hr and check 8 hour level Daily INR and heparin level Hold warfarin, monitor s/sx bleeding F/u plans for procedures  Thank you for allowing [place] to participate in this patients care. Signe Colt, PharmD Pager: [phone] 06/16/2015 10:30 AM

## 2015-06-16 NOTE — Progress Notes (Signed)
ANTICOAGULATION CONSULT NOTE  Pharmacy Consult for Heparin Indication: atrial fibrillation and DVT  Allergies  Allergen Reactions  . Bactrim Ds [Sulfamethoxazole-Trimethoprim] Rash  . Contrast Media [Iodinated Diagnostic Agents] Hives    BLE erythema   . Ciprofloxacin Rash  . Tape Rash   Patient Measurements: Height:  (162.6 cm) Weight: 149 lb 4 oz (67.7 kg) IBW/kg (Calculated) : 54.7 Heparin Dosing Weight: actual weight  Vital Signs: Temp: 98.7 F (37.1 C) (02/19 1335) BP: 101/41 mmHg (02/19 1335) Pulse Rate: 52 (02/19 1335)  Labs:  Recent Labs  06/14/15 0614  06/14/15 1610  06/15/15 0536 06/15/15 1324 06/16/15 0524 06/16/15 1913  HGB  --   < > 9.6*  --  9.5*  --  9.4*  --   HCT  --   --  31.9*  --  31.2*  --  31.0*  --   PLT  --   --  176  --  171  --  178  --   LABPROT 19.5*  --   --   --   --   --   --   --   INR 1.64*  --   --   --   --   --   --   --   HEPARINUNFRC  --   --   --   < > 0.46 0.60 0.75* 0.73*  CREATININE  --   --  1.28*  --  1.43*  --  1.32*  --   < > = values in this interval not displayed. Estimated Creatinine Clearance: 30.5 mL/min (by C-G formula based on Cr of 1.32).  Medical History: Past Medical History  Diagnosis Date  . PAD (peripheral artery disease) (HCC) 2013    left leg. sounds like she had angio-plasty   . Atrial fibrillation (HCC) 2013    s/p [facility] that failed. ON medication  . Hypertension   . Osteoporosis   . DVT (deep venous thrombosis) (HCC) 2013    "LLE" (08/04/2012)  . Hypothyroidism   . History of blood transfusion 2013    "related to thrombectomy" (08/04/2012)  . Anemia     "slightly" (08/04/2012)  . Arthritis     "probably minor; knees, pinky" (08/04/2012)  . Fracture of L4 vertebra (HCC)     "dx'd in 2013; can't treat til legs fixed" (08/04/2012)  . Lower extremity ulceration (HCC)   . Biventricular heart failure with reduced left ventricular function (HCC)    Medications:  Scheduled:  . aspirin EC  81 mg  Oral Daily  . bisacodyl  10 mg Rectal Once  . collagenase   Topical Daily  . docusate sodium  100 mg Oral Daily  . feeding supplement (ENSURE ENLIVE)  237 mL Oral TID BM  . levothyroxine  37.5 mcg Oral QAC breakfast  . metoprolol tartrate  12.5 mg Oral BID  . multivitamin with minerals  1 tablet Oral q morning - 10a  . oxyCODONE  10 mg Oral Q12H  . piperacillin-tazobactam (ZOSYN)  IV  3.375 g Intravenous Q8H  . sodium chloride flush  3 mL Intravenous Q12H  . vancomycin  750 mg Intravenous Q24H    Assessment: 85 yoF admitted on 2/13 with fever, weakness, and sepsis.  PMH includes chronic warfarin anticoagulation for afib and DVT. Pharmacy consulted to dose Heparin IV. -heparin level = 0.73 after decrease to 1000 units/hr  Goal of Therapy:  Heparin level 0.3-0.7 units/ml Monitor platelets by anticoagulation protocol: Yes   Plan:  -Decrease  heparin to 900 unitshr -Daily heparin level and CBC  Harland German, Pharm D 06/16/2015 9:01 PM

## 2015-06-16 NOTE — Progress Notes (Signed)
[facility] PROGRESS NOTE  Kristina Diaz QPR:[id number] DOB: 05-Jul-1929 DOA: 06/10/2015 PCP: Nyoka Cowden, MD  HPI/Brief narrative See admit h and p from 2/13 for details. Briefly, 82 y.o. year-old female with history of atrial fibrillation, PVD, heart failure, LLE DVT, chronic left leg wound who presents with fever and weakness. The patient has a history of nonhealing ulcers of the left lower extremity and underwent left popliteal and tibial revascularization with PTA of the popliteal, anterior tibial, and peroneal arteries by Dr. Earleen Newport on 05/09/2015. Dr. Earleen Newport also suspects she has significant venous disease contributing to her worsening left leg wounds. She has been started on broad-spectrum antibiotics while she thinks about the possibility of undergoing a venogram with intervention for limb salvage.   Assessment/Plan: Severe sepsis (fever to 101F at facility, tachycardia), possibly due to left lower extremity infected ulcer with surrounding cellulitis. Known peripheral arterial disease and venous disease. Hemodynamically stable- Blood cultures NGTD - ESR 77 and CRP 16.1, - d/c IVF - UA neg - Continue vancomycin and zosyn day 6 -bp recently soft , thus held lisinopril/demadex, continue betablocker for rate control with holding parameters. -Patient stable at present  Peripheral arterial disease s/p recent angioplasty of LLE 04/2015 by Dr. Earleen Newport - Continue aspirin - Heparin gtt now that INR below 2 Dr. Earleen Newport who recommended venogram to evaluate for venous obstruction, planned for 2/20   Chronic atrial fibrillation, - Holding warfarin. Resume after procedure(s) done -now on heparin drip, in chronic afib, tachycardia at times, bp soft, change long acting betablocker to lopressor with holding parameters, hopefully patient blood pressure able to tolerate betablocker, otherwise, need to consider amiodarone or dig for rate control. Underline sepsis driving  tachycardia. Will also check tsh. Keep Mag>2, k >4  NSVT: patient asymptomatic, continue low dose betablocker for now.  Chronic diastolic and systolic heart failure, echocardiogram this admission shows ejection fraction of 35%. Has range 30-40% in the past. resumed torsemide but once a day rather than twice daily. Weights are stable. 2/18: bp soft, d/c lisinopril, hold torsemide.  H/o left lower extremity DVT s/p thrombectomy in 2014, repeat venous US during this admission on acute DVT,  patient has been on chronic anticoagulation, now on heparin drip for anticipated procedure on monday   AKI, creatinine 1.4 on admission. 1.2  Kyphosis, severe compression fracture of L4  Hypothyroidism, stable, continue sythroid  Code Status: DNR Family Communication: Pt in room Disposition Plan: unknown at this time   Consultants:  Dr. Earleen Newport  Procedures:  Duplex left leg, venous  Antibiotics: Anti-infectives    Start     Dose/Rate Route Frequency Ordered Stop   06/11/15 1600  vancomycin (VANCOCIN) IVPB 750 mg/150 ml premix     750 mg 150 mL/hr over 60 Minutes Intravenous Every 24 hours 06/10/15 1459     06/10/15 2200  piperacillin-tazobactam (ZOSYN) IVPB 3.375 g     3.375 g 12.5 mL/hr over 240 Minutes Intravenous Every 8 hours 06/10/15 1459     06/10/15 1500  piperacillin-tazobactam (ZOSYN) IVPB 3.375 g     3.375 g 100 mL/hr over 30 Minutes Intravenous  Once 06/10/15 1448 06/10/15 1553   06/10/15 1500  vancomycin (VANCOCIN) IVPB 1000 mg/200 mL premix     1,000 mg 200 mL/hr over 60 Minutes Intravenous  Once 06/10/15 1448 06/10/15 1658       HPI/Subjective: Patient is without complaints at this time.   Objective: Filed Vitals:   06/16/15 9935 06/16/15 0914 06/16/15 0917 06/16/15 1335  BP:  90/36  90/36 101/41  Pulse:   88 52  Temp:    98.7 F (37.1 C)  TempSrc:      Resp:    20  Height:      Weight: 67.7 kg (149 lb 4 oz)     SpO2:    100%    Intake/Output Summary  (Last 24 hours) at 06/16/15 1644 Last data filed at 06/16/15 1636  Gross per 24 hour  Intake    610 ml  Output    300 ml  Net    310 ml   Filed Weights   06/14/15 1700 06/15/15 0500 06/16/15 0623  Weight: 59.557 kg (131 lb 4.8 oz) 60.1 kg (132 lb 7.9 oz) 67.7 kg (149 lb 4 oz)    Exam:   General:  Awake, in nad  Cardiovascular: regular, s1, s2  Respiratory: normal resp effort, no wheezing  Abdomen: soft, nondistended  Musculoskeletal: LE dressings in place, no clubbing   Data Reviewed: Basic Metabolic Panel:  Recent Labs Lab 06/12/15 1655 06/13/15 0630 06/14/15 0833 06/15/15 0536 06/16/15 0524  NA 144 144 146* 144 144  K 4.6 4.6 3.6 3.9 3.7  CL 109 112* 112* 110 109  CO2 24 22 25 26 26   GLUCOSE 174* 96 84 90 87  BUN 43* 39* 33* 34* 29*  CREATININE 1.34* 1.18* 1.28* 1.43* 1.32*  CALCIUM 8.9 8.9 8.4* 8.5* 8.5*  MG  --   --  2.0  --  2.1   Liver Function Tests:  Recent Labs Lab 06/10/15 1421  AST 37  ALT 22  ALKPHOS 70  BILITOT 1.3*  PROT 7.2  ALBUMIN 2.8*   No results for input(s): LIPASE, AMYLASE in the last 168 hours. No results for input(s): AMMONIA in the last 168 hours. CBC:  Recent Labs Lab 06/10/15 1421 06/11/15 0419 06/13/15 0630 06/14/15 0833 06/15/15 0536 06/16/15 0524  WBC 9.1 10.0 9.8 7.1 8.1 7.5  NEUTROABS 6.6  --   --   --   --   --   HGB 9.4* 9.6* 9.2* 9.6* 9.5* 9.4*  HCT 30.2* 31.6* 29.5* 31.9* 31.2* 31.0*  MCV 94.1 95.8 91.9 92.7 94.3 94.5  PLT 176 155 190 176 171 178   Cardiac Enzymes: No results for input(s): CKTOTAL, CKMB, CKMBINDEX, TROPONINI in the last 168 hours. BNP (last 3 results) No results for input(s): BNP in the last 8760 hours.  ProBNP (last 3 results) No results for input(s): PROBNP in the last 8760 hours.  CBG: No results for input(s): GLUCAP in the last 168 hours.  Recent Results (from the past 240 hour(s))  Culture, blood (routine x 2)     Status: None   Collection Time: 06/10/15  2:20 PM  Result  Value Ref Range Status   Specimen Description BLOOD RIGHT FOREARM  Final   Special Requests BOTTLES DRAWN AEROBIC AND ANAEROBIC 5 CC EA  Final   Culture   Final    NO GROWTH 5 DAYS Performed at [facility]    Report Status 06/15/2015 FINAL  Final  Culture, blood (routine x 2)     Status: None   Collection Time: 06/10/15  2:25 PM  Result Value Ref Range Status   Specimen Description BLOOD RIGHT ANTECUBITAL  Final   Special Requests BOTTLES DRAWN AEROBIC AND ANAEROBIC 5 CC EA  Final   Culture   Final    NO GROWTH 5 DAYS Performed at [facility]    Report Status 06/15/2015 FINAL  Final  Urine culture     Status: None   Collection Time: 06/11/15  8:47 AM  Result Value Ref Range Status   Specimen Description URINE, CATHETERIZED  Final   Special Requests NONE  Final   Culture   Final    NO GROWTH 1 DAY Performed at [facility]    Report Status 06/12/2015 FINAL  Final  MRSA PCR Screening     Status: None   Collection Time: 06/11/15 10:50 PM  Result Value Ref Range Status   MRSA by PCR NEGATIVE NEGATIVE Final    Comment:        The GeneXpert MRSA Assay (FDA approved for NASAL specimens only), is one component of a comprehensive MRSA colonization surveillance program. It is not intended to diagnose MRSA infection nor to guide or monitor treatment for MRSA infections.      Studies: No results found.  Scheduled Meds: . aspirin EC  81 mg Oral Daily  . bisacodyl  10 mg Rectal Once  . collagenase   Topical Daily  . docusate sodium  100 mg Oral Daily  . feeding supplement (ENSURE ENLIVE)  237 mL Oral TID BM  . levothyroxine  37.5 mcg Oral QAC breakfast  . metoprolol tartrate  12.5 mg Oral BID  . multivitamin with minerals  1 tablet Oral q morning - 10a  . oxyCODONE  10 mg Oral Q12H  . piperacillin-tazobactam (ZOSYN)  IV  3.375 g Intravenous Q8H  . sodium chloride flush  3 mL Intravenous Q12H  . vancomycin  750 mg Intravenous Q24H   Continuous  Infusions: . heparin 1,000 Units/hr (06/16/15 1021)    Active Problems:   PAD (peripheral artery disease) (HCC)   Atrial fibrillation (HCC)   Hypothyroidism   Long term (current) use of anticoagulants   Sepsis (HCC)   Leg ulcer, left (HCC)   Chronic systolic CHF (congestive heart failure) (HCC)   Cellulitis of left lower extremity   Critical lower limb ischemia   Hypotension   AKI (acute kidney injury) ([place])   Protein-calorie malnutrition, severe    Kerwin Augustus K  [facility] Pager [phone]. If 7PM-7AM, please contact night-coverage at [email or web address], password [facility] 06/16/2015, 4:44 PM  LOS: 6 days

## 2015-06-17 ENCOUNTER — Inpatient Hospital Stay (HOSPITAL_COMMUNITY): Payer: Medicare Other

## 2015-06-17 LAB — BASIC METABOLIC PANEL
Anion gap: 11 (ref 5–15)
BUN: 27 mg/dL — AB (ref 6–20)
CALCIUM: 8.9 mg/dL (ref 8.9–10.3)
CHLORIDE: 106 mmol/L (ref 101–111)
CO2: 26 mmol/L (ref 22–32)
CREATININE: 1.4 mg/dL — AB (ref 0.44–1.00)
GFR calc non Af Amer: 34 mL/min — ABNORMAL LOW (ref >60)
GFR, EST AFRICAN AMERICAN: 39 mL/min — AB (ref >60)
Glucose, Bld: 81 mg/dL (ref 65–99)
Potassium: 3.8 mmol/L (ref 3.5–5.1)
SODIUM: 143 mmol/L (ref 135–145)

## 2015-06-17 LAB — CBC
HEMATOCRIT: 31.3 % — AB (ref 36.0–46.0)
HEMOGLOBIN: 9.9 g/dL — AB (ref 12.0–15.0)
MCH: 30.5 pg (ref 26.0–34.0)
MCHC: 31.6 g/dL (ref 30.0–36.0)
MCV: 96.3 fL (ref 78.0–100.0)
Platelets: 170 10*3/uL (ref 150–400)
RBC: 3.25 MIL/uL — ABNORMAL LOW (ref 3.87–5.11)
RDW: 19.9 % — AB (ref 11.5–15.5)
WBC: 7.8 10*3/uL (ref 4.0–10.5)

## 2015-06-17 LAB — HEPARIN LEVEL (UNFRACTIONATED): HEPARIN UNFRACTIONATED: 0.46 [IU]/mL (ref 0.30–0.70)

## 2015-06-17 MED ORDER — FENTANYL CITRATE (PF) 100 MCG/2ML IJ SOLN
INTRAMUSCULAR | Status: AC | PRN
  Administered 2015-06-17: 12.5 ug via INTRAVENOUS
  Administered 2015-06-17: 25 ug via INTRAVENOUS
  Administered 2015-06-17: 12.5 ug via INTRAVENOUS

## 2015-06-17 MED ORDER — METHYLPREDNISOLONE SODIUM SUCC 125 MG IJ SOLR
125.0000 mg | Freq: Once | INTRAMUSCULAR | Status: AC
  Administered 2015-06-17: 125 mg via INTRAVENOUS
  Filled 2015-06-17: qty 2

## 2015-06-17 MED ORDER — LIDOCAINE HCL 1 % IJ SOLN
INTRAMUSCULAR | Status: AC
  Filled 2015-06-17: qty 20

## 2015-06-17 MED ORDER — MIDAZOLAM HCL 2 MG/2ML IJ SOLN
INTRAMUSCULAR | Status: AC
  Filled 2015-06-17: qty 2

## 2015-06-17 MED ORDER — MIDAZOLAM HCL 2 MG/2ML IJ SOLN
INTRAMUSCULAR | Status: AC | PRN
  Administered 2015-06-17 (×2): 0.5 mg via INTRAVENOUS

## 2015-06-17 MED ORDER — FENTANYL CITRATE (PF) 100 MCG/2ML IJ SOLN
INTRAMUSCULAR | Status: AC
  Filled 2015-06-17: qty 2

## 2015-06-17 MED ORDER — DIPHENHYDRAMINE HCL 50 MG/ML IJ SOLN
25.0000 mg | Freq: Once | INTRAMUSCULAR | Status: AC
  Administered 2015-06-17: 25 mg via INTRAVENOUS
  Filled 2015-06-17: qty 1

## 2015-06-17 MED ORDER — HYDROCORTISONE NA SUCCINATE PF 250 MG IJ SOLR
125.0000 mg | Freq: Once | INTRAMUSCULAR | Status: DC
  Filled 2015-06-17: qty 125

## 2015-06-17 NOTE — Care Management Important Message (Signed)
Important Message  Patient Details  Name: Kristina Diaz MRN: [id number] Date of Birth: 1930-04-26   Medicare Important Message Given:  Yes    Bernadette Hoit 06/17/2015, 11:31 AM

## 2015-06-17 NOTE — Sedation Documentation (Signed)
Patient is resting comfortably. 

## 2015-06-17 NOTE — Sedation Documentation (Signed)
Pt remains sleepy from IV Benadryl given earlier- Will give small dose  IV Fentanyl and monitor from there, MD agrees

## 2015-06-17 NOTE — Progress Notes (Signed)
ANTICOAGULATION CONSULT NOTE - Follow Up Consult  Pharmacy Consult for heparin Indication: atrial fibrillation and DVT  Allergies  Allergen Reactions  . Bactrim Ds [Sulfamethoxazole-Trimethoprim] Rash  . Contrast Media [Iodinated Diagnostic Agents] Hives    BLE erythema   . Ciprofloxacin Rash  . Tape Rash    Patient Measurements: Height:  (162.6 cm) Weight: 144 lb 6.4 oz (65.5 kg) IBW/kg (Calculated) : 54.7  Vital Signs: Temp: 98.5 F (36.9 C) (02/20 0604) Temp Source: Oral (02/20 0604) BP: 101/63 mmHg (02/20 1312) Pulse Rate: 100 (02/20 1312)  Labs:  Recent Labs  06/15/15 0536  06/16/15 0524 06/16/15 1913 06/17/15 0637  HGB 9.5*  --  9.4*  --  9.9*  HCT 31.2*  --  31.0*  --  31.3*  PLT 171  --  178  --  170  HEPARINUNFRC 0.46  < > 0.75* 0.73* 0.46  CREATININE 1.43*  --  1.32*  --  1.40*  < > = values in this interval not displayed.  Estimated Creatinine Clearance: 26.3 mL/min (by C-G formula based on Cr of 1.4).   Medications:  Infusions:  . heparin 900 Units/hr (06/17/15 0553)    Assessment: 82 y/o female on IV heparin while warfarin on hold for Afib and hx DVT. Heparin level is therapeutic at 0.46. No bleeding noted, CBC is stable.  PTA warfarin regimen: 4.5 mg daily except 3 mg on Tuesdays/Fridays per clinic note  Goal of Therapy:  INR 2-3 Monitor platelets by anticoagulation protocol: Yes   Plan:  - Continue heparin drip at 900 units/hr - Daily HL and CBC - Follow-up resuming warfarin  [facility], [place].D., BCPS Clinical Pharmacist Pager: [phone] 06/17/2015 1:17 PM

## 2015-06-17 NOTE — Progress Notes (Signed)
[facility] PROGRESS NOTE  Kristina Diaz HUD:[id number] DOB: [birth year] DOA: 06/10/2015 PCP: Nyoka Cowden, MD  HPI/Brief narrative See admit h and p from 2/13 for details. Briefly, 82 y.o. year-old female with history of atrial fibrillation, PVD, heart failure, LLE DVT, chronic left leg wound who presents with fever and weakness. The patient has a history of nonhealing ulcers of the left lower extremity and underwent left popliteal and tibial revascularization with PTA of the popliteal, anterior tibial, and peroneal arteries by Dr. Earleen Newport on 05/09/2015. Dr. Earleen Newport also suspects she has significant venous disease contributing to her worsening left leg wounds. She has been started on broad-spectrum antibiotics while she thinks about the possibility of undergoing a venogram with intervention for limb salvage.   Assessment/Plan: Severe sepsis (fever to 101F at facility, tachycardia), possibly due to left lower extremity infected ulcer with surrounding cellulitis. Known peripheral arterial disease and venous disease. Hemodynamically stable- Blood cultures NGTD - ESR 77 and CRP 16.1, - d/c IVF - UA neg - Continue vancomycin and zosyn day 6 -bp recently soft , thus held lisinopril/demadex, continue betablocker for rate control with holding parameters. -Patient stable at present  Peripheral arterial disease s/p recent angioplasty of LLE 04/2015 by Dr. Earleen Newport - Continue aspirin - Heparin gtt now that INR below 2 - Pt underwent venogram 2/20 with patent vessels  Chronic atrial fibrillation, - Holding warfarin. Resume after procedure(s) done -now on heparin drip, in chronic afib, tachycardia at times, bp soft, change long acting betablocker to lopressor with holding parameters, hopefully patient blood pressure able to tolerate betablocker, otherwise, need to consider amiodarone or dig for rate control. Underline sepsis driving tachycardia. Will also check tsh. Keep Mag>2, k  >4  NSVT: patient asymptomatic, continue low dose betablocker for now.  Chronic diastolic and systolic heart failure, echocardiogram this admission shows ejection fraction of 35%. Has range 30-40% in the past. resumed torsemide but once a day rather than twice daily. Weights are stable. 2/18: bp soft, d/c lisinopril, hold torsemide.  H/o left lower extremity DVT s/p thrombectomy in 2014, repeat venous US during this admission on acute DVT,  patient has been on chronic anticoagulation, now on heparin drip for anticipated procedure on monday  AKI, creatinine 1.4 on admission. 1.2  Kyphosis, severe compression fracture of L4  Hypothyroidism, stable, continue sythroid  Code Status: DNR Family Communication: Pt in room Disposition Plan: Possible d/c when stable off IV meds   Consultants:  Dr. Earleen Newport  Procedures:  Duplex left leg, venous  Venogram 2/20  Antibiotics: Anti-infectives    Start     Dose/Rate Route Frequency Ordered Stop   06/11/15 1600  vancomycin (VANCOCIN) IVPB 750 mg/150 ml premix     750 mg 150 mL/hr over 60 Minutes Intravenous Every 24 hours 06/10/15 1459     06/10/15 2200  piperacillin-tazobactam (ZOSYN) IVPB 3.375 g     3.375 g 12.5 mL/hr over 240 Minutes Intravenous Every 8 hours 06/10/15 1459     06/10/15 1500  piperacillin-tazobactam (ZOSYN) IVPB 3.375 g     3.375 g 100 mL/hr over 30 Minutes Intravenous  Once 06/10/15 1448 06/10/15 1553   06/10/15 1500  vancomycin (VANCOCIN) IVPB 1000 mg/200 mL premix     1,000 mg 200 mL/hr over 60 Minutes Intravenous  Once 06/10/15 1448 06/10/15 1658      HPI/Subjective: No complaints. Feels somewhat better  Objective: Filed Vitals:   06/17/15 1312 06/17/15 1355 06/17/15 1440 06/17/15 1515  BP: 101/63 118/69 111/68 131/64  Pulse: 100  82 86 59  Temp:  97.4 F (36.3 C)    TempSrc:  Oral    Resp: 16 18 19 19   Height:      Weight:      SpO2: 99% 100% 100% 100%    Intake/Output Summary (Last 24 hours)  at 06/17/15 1859 Last data filed at 06/17/15 1826  Gross per 24 hour  Intake 1232.15 ml  Output    200 ml  Net 1032.15 ml   Filed Weights   06/15/15 0500 06/16/15 0623 06/17/15 0351  Weight: 60.1 kg (132 lb 7.9 oz) 67.7 kg (149 lb 4 oz) 65.5 kg (144 lb 6.4 oz)    Exam:   General:  Awake, in nad  Cardiovascular: regular, s1, s2  Respiratory: normal resp effort, no wheezing  Abdomen: soft, nondistended  Musculoskeletal: LE dressings in place, no clubbing   Data Reviewed: Basic Metabolic Panel:  Recent Labs Lab 06/13/15 0630 06/14/15 0833 06/15/15 0536 06/16/15 0524 06/17/15 0637  NA 144 146* 144 144 143  K 4.6 3.6 3.9 3.7 3.8  CL 112* 112* 110 109 106  CO2 22 25 26 26 26   GLUCOSE 96 84 90 87 81  BUN 39* 33* 34* 29* 27*  CREATININE 1.18* 1.28* 1.43* 1.32* 1.40*  CALCIUM 8.9 8.4* 8.5* 8.5* 8.9  MG  --  2.0  --  2.1  --    Liver Function Tests: No results for input(s): AST, ALT, ALKPHOS, BILITOT, PROT, ALBUMIN in the last 168 hours. No results for input(s): LIPASE, AMYLASE in the last 168 hours. No results for input(s): AMMONIA in the last 168 hours. CBC:  Recent Labs Lab 06/13/15 0630 06/14/15 0833 06/15/15 0536 06/16/15 0524 06/17/15 0637  WBC 9.8 7.1 8.1 7.5 7.8  HGB 9.2* 9.6* 9.5* 9.4* 9.9*  HCT 29.5* 31.9* 31.2* 31.0* 31.3*  MCV 91.9 92.7 94.3 94.5 96.3  PLT 190 176 171 178 170   Cardiac Enzymes: No results for input(s): CKTOTAL, CKMB, CKMBINDEX, TROPONINI in the last 168 hours. BNP (last 3 results) No results for input(s): BNP in the last 8760 hours.  ProBNP (last 3 results) No results for input(s): PROBNP in the last 8760 hours.  CBG: No results for input(s): GLUCAP in the last 168 hours.  Recent Results (from the past 240 hour(s))  Culture, blood (routine x 2)     Status: None   Collection Time: 06/10/15  2:20 PM  Result Value Ref Range Status   Specimen Description BLOOD RIGHT FOREARM  Final   Special Requests BOTTLES DRAWN AEROBIC  AND ANAEROBIC 5 CC EA  Final   Culture   Final    NO GROWTH 5 DAYS Performed at [facility]    Report Status 06/15/2015 FINAL  Final  Culture, blood (routine x 2)     Status: None   Collection Time: 06/10/15  2:25 PM  Result Value Ref Range Status   Specimen Description BLOOD RIGHT ANTECUBITAL  Final   Special Requests BOTTLES DRAWN AEROBIC AND ANAEROBIC 5 CC EA  Final   Culture   Final    NO GROWTH 5 DAYS Performed at [facility]    Report Status 06/15/2015 FINAL  Final  Urine culture     Status: None   Collection Time: 06/11/15  8:47 AM  Result Value Ref Range Status   Specimen Description URINE, CATHETERIZED  Final   Special Requests NONE  Final   Culture   Final    NO GROWTH 1 DAY Performed at  [facility]    Report Status 06/12/2015 FINAL  Final  MRSA PCR Screening     Status: None   Collection Time: 06/11/15 10:50 PM  Result Value Ref Range Status   MRSA by PCR NEGATIVE NEGATIVE Final    Comment:        The GeneXpert MRSA Assay (FDA approved for NASAL specimens only), is one component of a comprehensive MRSA colonization surveillance program. It is not intended to diagnose MRSA infection nor to guide or monitor treatment for MRSA infections.      Studies: Ir Veno/ext/uni Left  06/17/2015  Corrie Mckusick, DO     06/17/2015  1:15 PM Interventional Radiology Procedure Note Procedure:  US guided access of left pedal vein, placement of IV for LLE venogram. US guided access of left femoral vein, venogram of iliac system. Complications: None Findings: Venogram LLE with and without tourniquet of the ankle demonstrates patency of the major tibial veins, and patency of the popliteal vein, patent femoral vein.  Venogram iliac system shows patent iliac system.  No occlusion or stenosis.  Recommendations: - Routine wound care left pedal vein IV site, now removed. - Routine wound care left femoral vein puncture site. - Do not submerge for 7 days. - Continue  current care, including outpatient wound care with [facility] and Dr. Dellia Nims. Signed, Dulcy Fanny. Earleen Newport, DO   Ir US Guide Vasc Access Left  06/17/2015  INDICATION: 82 year old female with a history of chronic left lower extremity wound. Distribution of the wound suggests element of venous etiology. Prior left popliteal artery occlusion has been treated, restoring flow to the tibials. She presents today for venography, possible intervention. EXAM: LEFT EXTREMITY VENOGRAPHY; IR ULTRASOUND GUIDANCE VASC ACCESS LEFT COMPARISON:  CT 04/25/2015 MEDICATIONS: Patient received pretreatment for a contrast allergy with Solu-Medrol and Benadryl IV ANESTHESIA/SEDATION: Versed 0 mg IV; Fentanyl 25 mcg IV Moderate Sedation Time:  25 minutes The patient was continuously monitored during the procedure by the interventional radiology nurse under my direct supervision. FLUOROSCOPY TIME:  Fluoroscopy Time: 0 minutes 54 seconds (26 mGy). COMPLICATIONS: None TECHNIQUE: Informed written consent was obtained from the patient after a thorough discussion of the procedural risks, benefits and alternatives. All questions were addressed. Maximal Sterile Barrier Technique was utilized including caps, mask, sterile gowns, sterile gloves, sterile drape, hand hygiene and skin antiseptic. A timeout was performed prior to the initiation of the procedure. Ultrasound survey of the left pedal veins was performed with images stored and sent to PACs. Once the patient is prepped and draped in usual sterile fashion, the skin was infiltrated with 1% lidocaine overlying the selected pedal vein for access. A 20 gauge angiocath was then inserted into the selected pedal vein using ultrasound guidance. With confirmed blood return, 1 cc contrast was infused to confirm intravenous position. CO2 venogram of the left lower extremity was performed first with no tourniquet in place, and a second with a tourniquet in place at the ankle. Ultrasound survey of the  proximal left thigh was performed with images stored and sent to PACs. A micropuncture needle was used access the proximal left femoral vein under ultrasound. With venous blood flow returned, and an .018 micro wire was passed through the needle, observed enter the iliac vein under fluoroscopy. The needle was removed, and a micropuncture sheath was placed over the wire. The inner dilator and wire were removed, and an 035 Bentson wire was advanced under fluoroscopy into the pelvis. The sheath was removed and a standard 5  [place] vascular sheath was placed. The dilator was removed and the sheath was flushed. CO2 venogram and then a contrast venogram of the left iliac system was performed, with no occlusion or stenosis identified. Both the pedal access and the femoral vein access were removed with compression for hemostasis. Sterile bandages were placed. Patient tolerated the procedure well and remained hemodynamically stable throughout. No complications were encountered and no significant blood loss. FINDINGS: CO2 venogram of the left tibial venous system, both the superficial and the deep (without a tourniquet and with a tourniquet in place) demonstrate patency of the deep venous system, and continuous inline through the popliteal vein and femoral vein. Venogram of the left iliac system demonstrates no occlusion and no significant stenosis. No chronic occlusion was identified for treatment. IMPRESSION: Status post left lower extremity venography, confirming patency of the deep veins of the tibial system, and patency of the iliac system. The femoral vein has been documented to be patent on prior duplex ultrasound. No obstruction was found requiring treatment. My impression is that the venous contribution to the left lower extremity chronic wound is not from obstructed vein, though is exacerbated by poor foot muscle pump, calf muscle pump, elevated central venous pressures (cardiomegaly), and dependent positioning of the  wound. Signed, Dulcy Fanny. Earleen Newport, DO [facility] Radiology Electronically Signed   By: Corrie Mckusick D.O.   On: 06/17/2015 13:43   Ir US Guide Vasc Access Left  06/17/2015  INDICATION: 82 year old female with a history of chronic left lower extremity wound. Distribution of the wound suggests element of venous etiology. Prior left popliteal artery occlusion has been treated, restoring flow to the tibials. She presents today for venography, possible intervention. EXAM: LEFT EXTREMITY VENOGRAPHY; IR ULTRASOUND GUIDANCE VASC ACCESS LEFT COMPARISON:  CT 04/25/2015 MEDICATIONS: Patient received pretreatment for a contrast allergy with Solu-Medrol and Benadryl IV ANESTHESIA/SEDATION: Versed 0 mg IV; Fentanyl 25 mcg IV Moderate Sedation Time:  25 minutes The patient was continuously monitored during the procedure by the interventional radiology nurse under my direct supervision. FLUOROSCOPY TIME:  Fluoroscopy Time: 0 minutes 54 seconds (26 mGy). COMPLICATIONS: None TECHNIQUE: Informed written consent was obtained from the patient after a thorough discussion of the procedural risks, benefits and alternatives. All questions were addressed. Maximal Sterile Barrier Technique was utilized including caps, mask, sterile gowns, sterile gloves, sterile drape, hand hygiene and skin antiseptic. A timeout was performed prior to the initiation of the procedure. Ultrasound survey of the left pedal veins was performed with images stored and sent to PACs. Once the patient is prepped and draped in usual sterile fashion, the skin was infiltrated with 1% lidocaine overlying the selected pedal vein for access. A 20 gauge angiocath was then inserted into the selected pedal vein using ultrasound guidance. With confirmed blood return, 1 cc contrast was infused to confirm intravenous position. CO2 venogram of the left lower extremity was performed first with no tourniquet in place, and a second  with a tourniquet in place at the ankle. Ultrasound survey of the proximal left thigh was performed with images stored and sent to PACs. A micropuncture needle was used access the proximal left femoral vein under ultrasound. With venous blood flow returned, and an .018 micro wire was passed through the needle, observed enter the iliac vein under fluoroscopy. The needle was removed, and a micropuncture sheath was placed over the wire. The inner dilator and wire were removed, and an 035 Bentson wire was advanced under fluoroscopy into the  pelvis. The sheath was removed and a standard 5 [place] vascular sheath was placed. The dilator was removed and the sheath was flushed. CO2 venogram and then a contrast venogram of the left iliac system was performed, with no occlusion or stenosis identified. Both the pedal access and the femoral vein access were removed with compression for hemostasis. Sterile bandages were placed. Patient tolerated the procedure well and remained hemodynamically stable throughout. No complications were encountered and no significant blood loss. FINDINGS: CO2 venogram of the left tibial venous system, both the superficial and the deep (without a tourniquet and with a tourniquet in place) demonstrate patency of the deep venous system, and continuous inline through the popliteal vein and femoral vein. Venogram of the left iliac system demonstrates no occlusion and no significant stenosis. No chronic occlusion was identified for treatment. IMPRESSION: Status post left lower extremity venography, confirming patency of the deep veins of the tibial system, and patency of the iliac system. The femoral vein has been documented to be patent on prior duplex ultrasound. No obstruction was found requiring treatment. My impression is that the venous contribution to the left lower extremity chronic wound is not from obstructed vein, though is exacerbated by poor foot muscle pump, calf muscle pump, elevated central  venous pressures (cardiomegaly), and dependent positioning of the wound. Signed, Dulcy Fanny. Earleen Newport, DO [facility] Radiology Electronically Signed   By: Corrie Mckusick D.O.   On: 06/17/2015 13:43    Scheduled Meds: . aspirin EC  81 mg Oral Daily  . bisacodyl  10 mg Rectal Once  . collagenase   Topical Daily  . docusate sodium  100 mg Oral Daily  . feeding supplement (ENSURE ENLIVE)  237 mL Oral TID BM  . fentaNYL      . levothyroxine  37.5 mcg Oral QAC breakfast  . lidocaine      . metoprolol tartrate  12.5 mg Oral BID  . midazolam      . multivitamin with minerals  1 tablet Oral q morning - 10a  . oxyCODONE  10 mg Oral Q12H  . piperacillin-tazobactam (ZOSYN)  IV  3.375 g Intravenous Q8H  . sodium chloride flush  3 mL Intravenous Q12H  . vancomycin  750 mg Intravenous Q24H   Continuous Infusions: . heparin 900 Units/hr (06/17/15 0553)    Active Problems:   PAD (peripheral artery disease) (HCC)   Atrial fibrillation (HCC)   Hypothyroidism   Long term (current) use of anticoagulants   Sepsis (HCC)   Leg ulcer, left (HCC)   Chronic systolic CHF (congestive heart failure) (HCC)   Cellulitis of left lower extremity   Critical lower limb ischemia   Hypotension   AKI (acute kidney injury) ([place])   Protein-calorie malnutrition, severe    Abdou Stocks K  [facility] Pager [phone]. If 7PM-7AM, please contact night-coverage at [email or web address], password [facility] 06/17/2015, 6:59 PM  LOS: 7 days

## 2015-06-17 NOTE — Procedures (Signed)
Interventional Radiology Procedure Note  Procedure:  US guided access of left pedal vein, placement of IV for LLE venogram. US guided access of left femoral vein, venogram of iliac system.  Complications: None  Findings:  Venogram LLE with and without tourniquet of the ankle demonstrates patency of the major tibial veins, and patency of the popliteal vein, patent femoral vein.   Venogram iliac system shows patent iliac system.  No occlusion or stenosis.    Recommendations:  - Routine wound care left pedal vein IV site, now removed.  - Routine wound care left femoral vein puncture site.  - Do not submerge for 7 days. - Continue current care, including outpatient wound care with [facility] and Dr. Leanord Hawking.   Signed,  Yvone Neu. Loreta Ave, DO

## 2015-06-17 NOTE — Progress Notes (Signed)
Patient active with [facility] for [facility] pt rn

## 2015-06-17 NOTE — Sedation Documentation (Signed)
Dsg to L  foot intact and L groin- venous access.

## 2015-06-17 NOTE — Progress Notes (Signed)
Referring Physician(s):  [facility] Chiu  Chief Complaint:  Chronic left leg wound  Subjective:  LLE infected foot ulcer Sepsis Known peripheral arterial disease Angioplasty 05/09/2015 LLE duplex and CT does confirm venous component Now scheduled for left lower extr venogram with possible intervention  Dr Loreta Ave has seen and examined pt Scheduled for procedure today in IR  Allergies: Bactrim ds; Contrast media; Ciprofloxacin; and Tape  Medications: Prior to Admission medications   Medication Sig Start Date End Date Taking? Authorizing Provider  Amino Acids-Protein Hydrolys (FEEDING SUPPLEMENT, PRO-STAT SUGAR FREE 64,) LIQD Take 30 mLs by mouth 2 (two) times daily.   Yes Historical Provider, MD  aspirin EC 81 MG EC tablet Take 1 tablet (81 mg total) by mouth daily. 05/13/15  Yes Maryann Mikhail, DO  docusate sodium (COLACE) 100 MG capsule Take 100 mg by mouth daily.   Yes Historical Provider, MD  levothyroxine (SYNTHROID, LEVOTHROID) 25 MCG tablet TAKE 1 AND 1/2 TABS (37.5MG ) BY MOUTH EVERY DAY*CHECK PULSE WEEKLY* 07/14/12  Yes Jacques Navy, MD  metoprolol succinate (TOPROL-XL) 25 MG 24 hr tablet Take 0.5 tablets (12.5 mg total) by mouth daily. 11/01/12  Yes Jacques Navy, MD  Multiple Vitamin (MULTIVITAMIN WITH MINERALS) TABS Take 1 tablet by mouth every morning.   Yes Historical Provider, MD  oxyCODONE (OXY IR/ROXICODONE) 5 MG immediate release tablet Take 5 mg by mouth every 6 (six) hours as needed for moderate pain or severe pain.   Yes Historical Provider, MD  oxyCODONE (OXYCONTIN) 10 mg 12 hr tablet Take 1 tablet (10 mg total) by mouth every 12 (twelve) hours. 05/28/15  Yes Gordy Savers, MD  potassium chloride (K-DUR,KLOR-CON) 10 MEQ tablet Take 1 tablet (10 mEq total) by mouth 2 (two) times daily. 08/11/12  Yes Hadassah Pais, PA-C  torsemide (DEMADEX) 20 MG tablet TAKE 1 TAB BY MOUTH EVERY DAY -- DX: EDEMA AND TAKE 1 TAB BY MOUTH AT 5PM ON MON WED FRI (THIS IS.IN  ADDITION TO MORNING SCHEDULE DOSE) -... Patient taking differently: TAKE 20 MG BY MOUTH EVERY DAY -- DX: EDEMA AND TAKE 1 TAB BY MOUTH AT 5PM ON MON WED FRI (THIS IS.IN ADDITION TO MORNING SCHEDULE DOSE) -... 04/24/14  Yes Laurey Morale, MD  torsemide [facility]) 20 MG tablet Take 20 mg by mouth every Monday, Wednesday, and Friday. Reported on 06/10/2015   Yes Historical Provider, MD  UNABLE TO FIND Take 118 mLs by mouth 2 (two) times daily. Med Name: Mighty Shakes 12 noon and 5 pm   Yes Historical Provider, MD  warfarin (COUMADIN) 3 MG tablet Take 4.5mg  (one and a half pills) everyday except Tuesday and Friday. Take  (one pill) on Tuesday and Friday. Patient not taking: Reported on 06/08/2015 05/14/15   Edsel Petrin, DO     Vital Signs: BP 98/44 mmHg  Pulse 79  Temp(Src) 98.5 F (36.9 C) (Oral)  Resp 16  Ht  (1.626 m)  Wt 144 lb 6.4 oz (65.5 kg)  BMI 24.77 kg/m2  SpO2 97%  Physical Exam  Constitutional: She is oriented to person, place, and time.  Cardiovascular: Normal rate and regular rhythm.  Exam reveals no friction rub.   Pulmonary/Chest: Effort normal and breath sounds normal. She has no wheezes.  Abdominal: Soft.  Musculoskeletal:  LLE foot ulcer  Neurological: She is alert and oriented to person, place, and time.  Skin: Skin is warm and dry.  Psychiatric: She has a normal mood and affect. Her behavior is  normal. Judgment and thought content normal.    Imaging: No results found.  Labs:  CBC:  Recent Labs  06/14/15 0833 06/15/15 0536 06/16/15 0524 06/17/15 0637  WBC 7.1 8.1 7.5 7.8  HGB 9.6* 9.5* 9.4* 9.9*  HCT 31.9* 31.2* 31.0* 31.3*  PLT 176 171 178 170    COAGS:  Recent Labs  01/23/15 1246  05/09/15 0719  06/10/15 1812 06/11/15 0419 06/12/15 1655 06/13/15 0630 06/14/15 0614  INR 2.89*  < > 1.25  < > 5.10* 5.80* 5.36* 2.35* 1.64*  APTT 40*  --  32  --  50*  --   --   --   --   < > = values in this interval not  displayed.  BMP:  Recent Labs  06/14/15 0833 06/15/15 0536 06/16/15 0524 06/17/15 0637  NA 146* 144 144 143  K 3.6 3.9 3.7 3.8  CL 112* 110 109 106  CO2 GLUCOSE 84 90 87 81  BUN 33* 34* 29* 27*  CALCIUM 8.4* 8.5* 8.5* 8.9  CREATININE 1.28* 1.43* 1.32* 1.40*  GFRNONAA 38* 33* 36* 34*  GFRAA 44* 38* 42* 39*    LIVER FUNCTION TESTS:  Recent Labs  02/14/15 1331 05/10/15 0143 05/11/15 0330 06/10/15 1421  BILITOT 0.7 0.9 2.5* 1.3*  AST 21 74* 112* 37  ALT 8 23 50 22  ALKPHOS 76 52 46 70  PROT 7.6 5.6* 5.5* 7.2  ALBUMIN 3.3* 2.2* 1.9* 2.8*    Assessment and Plan:  LLE foot ulcer Arterial pta 04/2015 Venous component to be addressed today with IR procedure Probable LLE venous intervention Pt aware of procedure benefits and risks including but not limited to Infection, bleeding, vessel damage; damage to surrounding structures Agreeable to proceed Consent to be obtained with Dr Loreta Ave in IR  Electronically Signed: Ralene Muskrat A 06/17/2015, 10:30 AM   I spent a total of 25 Minutes at the the patient's bedside AND on the patient's hospital floor or unit, greater than 50% of which was counseling/coordinating care for LLE venous intervention

## 2015-06-17 NOTE — Progress Notes (Signed)
Pharmacy Antibiotic Note  Kristina Diaz is a 82 y.o. female admitted on 06/10/2015 with weakness and fever since 2/8.  LLE infected ulcer w/ cellulitis, possible gangrene. Pharmacy has been consulted for vancomycin and Zosyn for severe sepsis - Day #8. Plan is for IR procedure today.  Plan: This patient's current antibiotics will be continued without adjustments.  Follow-up plan for antibiotics and LOT Monitor renal function Vancomycin trough in next few days if continued  Height:  (162.6 cm) Weight: 144 lb 6.4 oz (65.5 kg) IBW/kg (Calculated) : 54.7  Temp (24hrs), Avg:98.5 F (36.9 C), Min:98.4 F (36.9 C), Max:98.7 F (37.1 C)   Recent Labs Lab 06/10/15 1432 06/10/15 1825  06/13/15 0630 06/14/15 0833 06/14/15 1431 06/15/15 0536 06/16/15 0524 06/17/15 0637  WBC  --   --   < > 9.8 7.1  --  8.1 7.5 7.8  CREATININE  --   --   < > 1.18* 1.28*  --  1.43* 1.32* 1.40*  LATICACIDVEN 1.93 1.31  --   --   --   --   --   --   --   VANCOTROUGH  --   --   --   --   --  19  --   --   --   < > = values in this interval not displayed.  Estimated Creatinine Clearance: 26.3 mL/min (by C-G formula based on Cr of 1.4).    Allergies  Allergen Reactions  . Bactrim Ds [Sulfamethoxazole-Trimethoprim] Rash  . Contrast Media [Iodinated Diagnostic Agents] Hives    BLE erythema   . Ciprofloxacin Rash  . Tape Rash    Antimicrobials this admission: Vancomycin 2/13 >>  Zosyn 2/13 >>   Dose adjustments this admission: 2/17 VT 19  Microbiology results: 2/13 BCx2: ng final 2/14 UCx: ng final 2/14 MRSA PCR: neg   Thank you for allowing pharmacy to be a part of this patient's care.  [place], [address].D., BCPS Clinical Pharmacist Pager: [phone] 06/17/2015 1:15 PM

## 2015-06-17 NOTE — Care Management (Signed)
This patient is active with [facility]. We are providing RN and PT services. I will follow for discharge needs.  Thank you. Ayesha Rumpf RN BSN [facility] [phone]

## 2015-06-18 LAB — CBC
HCT: 33.7 % — ABNORMAL LOW (ref 36.0–46.0)
HEMOGLOBIN: 10 g/dL — AB (ref 12.0–15.0)
MCH: 28.2 pg (ref 26.0–34.0)
MCHC: 29.7 g/dL — ABNORMAL LOW (ref 30.0–36.0)
MCV: 95.2 fL (ref 78.0–100.0)
Platelets: 170 10*3/uL (ref 150–400)
RBC: 3.54 MIL/uL — AB (ref 3.87–5.11)
RDW: 20 % — ABNORMAL HIGH (ref 11.5–15.5)
WBC: 10.1 10*3/uL (ref 4.0–10.5)

## 2015-06-18 LAB — HEPARIN LEVEL (UNFRACTIONATED): HEPARIN UNFRACTIONATED: 0.47 [IU]/mL (ref 0.30–0.70)

## 2015-06-18 LAB — BASIC METABOLIC PANEL
Anion gap: 11 (ref 5–15)
BUN: 26 mg/dL — ABNORMAL HIGH (ref 6–20)
CHLORIDE: 103 mmol/L (ref 101–111)
CO2: 26 mmol/L (ref 22–32)
Calcium: 9.2 mg/dL (ref 8.9–10.3)
Creatinine, Ser: 1.22 mg/dL — ABNORMAL HIGH (ref 0.44–1.00)
GFR calc non Af Amer: 40 mL/min — ABNORMAL LOW (ref >60)
GFR, EST AFRICAN AMERICAN: 46 mL/min — AB (ref >60)
Glucose, Bld: 138 mg/dL — ABNORMAL HIGH (ref 65–99)
POTASSIUM: 4.1 mmol/L (ref 3.5–5.1)
Sodium: 140 mmol/L (ref 135–145)

## 2015-06-18 MED ORDER — ENSURE ENLIVE PO LIQD
237.0000 mL | Freq: Two times a day (BID) | ORAL | Status: DC
  Administered 2015-06-18 – 2015-06-20 (×4): 237 mL via ORAL

## 2015-06-18 MED ORDER — WARFARIN - PHARMACIST DOSING INPATIENT
Freq: Every day | Status: DC
  Administered 2015-06-18 – 2015-06-19 (×2)

## 2015-06-18 MED ORDER — WARFARIN SODIUM 6 MG PO TABS
6.0000 mg | ORAL_TABLET | Freq: Once | ORAL | Status: AC
  Administered 2015-06-18: 6 mg via ORAL
  Filled 2015-06-18: qty 1

## 2015-06-18 NOTE — Care Management Note (Signed)
Case Management Note  Patient Details  Name: Kristina Diaz MRN: [id number] Date of Birth: August 02, 1929  Subjective/Objective:                  Spoke with patient at the bedside. She is from [facility], where she lives with her husband who is on oxygen "has one lung" but also help to take care of her per her report. She states that she has a cane, walker, wheelchair and heavy assist from staff who bath and dress her and take her to the bathroom. [facility] and has [facility] RN take care of her leg wound twice a week. She also gets PT from [place]. Patient has been approved for [facility] with [facility] Genevieve Norlander does not participate with HRI) to get services at home daily initially after discharge, but patient is not willing to change caregivers at this time.    Action/Plan:   Expected Discharge Date:                  Expected Discharge Plan:  Skilled Nursing Facility  In-House Referral:  Clinical Social Work  Discharge planning Services     Post Acute Care Choice:    Choice offered to:     DME Arranged:    DME Agency:     HH Arranged:    HH Agency:     Status of Service:  Completed, signed off  Medicare Important Message Given:  Yes Date Medicare IM Given:    Medicare IM give by:    Date Additional Medicare IM Given:    Additional Medicare Important Message give by:     If discussed at Long Length of Stay Meetings, dates discussed:    Additional Comments:  Lawerance Sabal, RN 06/18/2015, 2:04 PM

## 2015-06-18 NOTE — Progress Notes (Signed)
[facility] PROGRESS NOTE  Kristina Diaz NWG:[id number] DOB: [birth year] DOA: 06/10/2015 PCP: Nyoka Cowden, MD  HPI/Brief narrative See admit h and p from 2/13 for details. Briefly, 82 y.o. year-old female with history of atrial fibrillation, PVD, heart failure, LLE DVT, chronic left leg wound who presents with fever and weakness. The patient has a history of nonhealing ulcers of the left lower extremity and underwent left popliteal and tibial revascularization with PTA of the popliteal, anterior tibial, and peroneal arteries by Dr. Earleen Newport on 05/09/2015. Dr. Earleen Newport also suspects she has significant venous disease contributing to her worsening left leg wounds. She has been started on broad-spectrum antibiotics while she thinks about the possibility of undergoing a venogram with intervention for limb salvage.   Assessment/Plan: Severe sepsis (fever to 101F at facility, tachycardia), possibly due to left lower extremity infected ulcer with surrounding cellulitis.Known peripheral arterial disease and venous disease. Hemodynamically stable- Blood cultures NGTD - ESR 77 and CRP 16.1, - d/c IVF - UA neg - Continued vancomycin and zosyn since 2/13. Will complete course after one more day of tx for total of 10 days of antibiotics -bp recently soft , thus held lisinopril/demadex, continue betablocker for rate control with holding parameters. -Patient stable at present  Peripheral arterial disease s/p recent angioplasty of LLE 04/2015 by Dr. Earleen Newport - Continue aspirin - Heparin gtt now - Pt underwent venogram 2/20 with patent vessels - Will resume coumadin per pharmacy dosing  Chronic atrial fibrillation, -now on heparin drip, in chronic afib, -Currently rate controlled on beta blocker -coumadin resumed on 2/21 post-venogram, pharmacy to dose  NSVT: patient asymptomatic, continue low dose betablocker for now.  Chronic diastolic and systolic heart failure, echocardiogram this  admission shows ejection fraction of 35%. Has range 30-40% in the past. resumed torsemide but once a day rather than twice daily. Weights are stable. 2/18: bp soft, d/c'd lisinopril, held torsemide.  H/o left lower extremity DVT s/p thrombectomy in 2014, repeat venous US during this admission on 2/14 neg for acute DVT,  patient has been on chronic anticoagulation  AKI, creatinine 1.4 on admission. 1.2 currently  Kyphosis, severe compression fracture of L4. stable  Hypothyroidism, stable, continue sythroid  Code Status: DNR Family Communication: Pt in room Disposition Plan: Possible d/c in 24hrs   Consultants:  Dr. Earleen Newport  Procedures:  Duplex left leg, venous  Venogram 2/20  Antibiotics: Anti-infectives    Start     Dose/Rate Route Frequency Ordered Stop   06/11/15 1600  vancomycin (VANCOCIN) IVPB 750 mg/150 ml premix     750 mg 150 mL/hr over 60 Minutes Intravenous Every 24 hours 06/10/15 1459 06/20/15 1559   06/10/15 2200  piperacillin-tazobactam (ZOSYN) IVPB 3.375 g     3.375 g 12.5 mL/hr over 240 Minutes Intravenous Every 8 hours 06/10/15 1459 06/19/15 1749   06/10/15 1500  piperacillin-tazobactam (ZOSYN) IVPB 3.375 g     3.375 g 100 mL/hr over 30 Minutes Intravenous  Once 06/10/15 1448 06/10/15 1553   06/10/15 1500  vancomycin (VANCOCIN) IVPB 1000 mg/200 mL premix     1,000 mg 200 mL/hr over 60 Minutes Intravenous  Once 06/10/15 1448 06/10/15 1658      HPI/Subjective: No complaints. Feels somewhat better  Objective: Filed Vitals:   06/17/15 2139 06/18/15 0517 06/18/15 1020 06/18/15 1424  BP: 103/51 98/61 98/43  106/49  Pulse: 102 81 75 81  Temp: 98.1 F (36.7 C) 97.9 F (36.6 C)  98 F (36.7 C)  TempSrc: Oral Oral  Oral  Resp:  16 16  16   Height:      Weight:  68.1 kg (150 lb 2.1 oz)    SpO2: 100% 99%  100%    Intake/Output Summary (Last 24 hours) at 06/18/15 1751 Last data filed at 06/18/15 1424  Gross per 24 hour  Intake    540 ml  Output       0 ml  Net    540 ml   Filed Weights   06/16/15 0623 06/17/15 0351 06/18/15 0517  Weight: 67.7 kg (149 lb 4 oz) 65.5 kg (144 lb 6.4 oz) 68.1 kg (150 lb 2.1 oz)    Exam:   General:  Awake, in nad  Cardiovascular: regular, s1, s2  Respiratory: normal resp effort, no wheezing  Abdomen: soft, nondistended  Musculoskeletal: LE dressings in place, no clubbing, LLE wound with granulation appears to be healing  Data Reviewed: Basic Metabolic Panel:  Recent Labs Lab 06/14/15 0833 06/15/15 0536 06/16/15 0524 06/17/15 0637 06/18/15 0643  NA 146* 144 144 143 140  K 3.6 3.9 3.7 3.8 4.1  CL 112* 110 109 106 103  CO2 25 26 26 26 26   GLUCOSE 84 90 87 81 138*  BUN 33* 34* 29* 27* 26*  CREATININE 1.28* 1.43* 1.32* 1.40* 1.22*  CALCIUM 8.4* 8.5* 8.5* 8.9 9.2  MG 2.0  --  2.1  --   --    Liver Function Tests: No results for input(s): AST, ALT, ALKPHOS, BILITOT, PROT, ALBUMIN in the last 168 hours. No results for input(s): LIPASE, AMYLASE in the last 168 hours. No results for input(s): AMMONIA in the last 168 hours. CBC:  Recent Labs Lab 06/14/15 0833 06/15/15 0536 06/16/15 0524 06/17/15 0637 06/18/15 0643  WBC 7.1 8.1 7.5 7.8 10.1  HGB 9.6* 9.5* 9.4* 9.9* 10.0*  HCT 31.9* 31.2* 31.0* 31.3* 33.7*  MCV 92.7 94.3 94.5 96.3 95.2  PLT 176 171 178 170 170   Cardiac Enzymes: No results for input(s): CKTOTAL, CKMB, CKMBINDEX, TROPONINI in the last 168 hours. BNP (last 3 results) No results for input(s): BNP in the last 8760 hours.  ProBNP (last 3 results) No results for input(s): PROBNP in the last 8760 hours.  CBG: No results for input(s): GLUCAP in the last 168 hours.  Recent Results (from the past 240 hour(s))  Culture, blood (routine x 2)     Status: None   Collection Time: 06/10/15  2:20 PM  Result Value Ref Range Status   Specimen Description BLOOD RIGHT FOREARM  Final   Special Requests BOTTLES DRAWN AEROBIC AND ANAEROBIC 5 CC EA  Final   Culture   Final    NO  GROWTH 5 DAYS Performed at [facility]    Report Status 06/15/2015 FINAL  Final  Culture, blood (routine x 2)     Status: None   Collection Time: 06/10/15  2:25 PM  Result Value Ref Range Status   Specimen Description BLOOD RIGHT ANTECUBITAL  Final   Special Requests BOTTLES DRAWN AEROBIC AND ANAEROBIC 5 CC EA  Final   Culture   Final    NO GROWTH 5 DAYS Performed at [facility]    Report Status 06/15/2015 FINAL  Final  Urine culture     Status: None   Collection Time: 06/11/15  8:47 AM  Result Value Ref Range Status   Specimen Description URINE, CATHETERIZED  Final   Special Requests NONE  Final   Culture   Final    NO GROWTH 1 DAY Performed at  [facility]    Report Status 06/12/2015 FINAL  Final  MRSA PCR Screening     Status: None   Collection Time: 06/11/15 10:50 PM  Result Value Ref Range Status   MRSA by PCR NEGATIVE NEGATIVE Final    Comment:        The GeneXpert MRSA Assay (FDA approved for NASAL specimens only), is one component of a comprehensive MRSA colonization surveillance program. It is not intended to diagnose MRSA infection nor to guide or monitor treatment for MRSA infections.      Studies: Ir Veno/ext/uni Left  06/17/2015  Corrie Mckusick, DO     06/17/2015  1:15 PM Interventional Radiology Procedure Note Procedure:  US guided access of left pedal vein, placement of IV for LLE venogram. US guided access of left femoral vein, venogram of iliac system. Complications: None Findings: Venogram LLE with and without tourniquet of the ankle demonstrates patency of the major tibial veins, and patency of the popliteal vein, patent femoral vein.  Venogram iliac system shows patent iliac system.  No occlusion or stenosis.  Recommendations: - Routine wound care left pedal vein IV site, now removed. - Routine wound care left femoral vein puncture site. - Do not submerge for 7 days. - Continue current care, including outpatient [facility] care with  [facility] and Dr. Dellia Nims. Signed, Dulcy Fanny. Earleen Newport, DO   Ir US Guide Vasc Access Left  06/17/2015  INDICATION: 82 year old female with a history of chronic left lower extremity wound. Distribution of the wound suggests element of venous etiology. Prior left popliteal artery occlusion has been treated, restoring flow to the tibials. She presents today for venography, possible intervention. EXAM: LEFT EXTREMITY VENOGRAPHY; IR ULTRASOUND GUIDANCE VASC ACCESS LEFT COMPARISON:  CT 04/25/2015 MEDICATIONS: Patient received pretreatment for a contrast allergy with Solu-Medrol and Benadryl IV ANESTHESIA/SEDATION: Versed 0 mg IV; Fentanyl 25 mcg IV Moderate Sedation Time:  25 minutes The patient was continuously monitored during the procedure by the interventional radiology nurse under my direct supervision. FLUOROSCOPY TIME:  Fluoroscopy Time: 0 minutes 54 seconds (26 mGy). COMPLICATIONS: None TECHNIQUE: Informed written consent was obtained from the patient after a thorough discussion of the procedural risks, benefits and alternatives. All questions were addressed. Maximal Sterile Barrier Technique was utilized including caps, mask, sterile gowns, sterile gloves, sterile drape, hand hygiene and skin antiseptic. A timeout was performed prior to the initiation of the procedure. Ultrasound survey of the left pedal veins was performed with images stored and sent to PACs. Once the patient is prepped and draped in usual sterile fashion, the skin was infiltrated with 1% lidocaine overlying the selected pedal vein for access. A 20 gauge angiocath was then inserted into the selected pedal vein using ultrasound guidance. With confirmed blood return, 1 cc contrast was infused to confirm intravenous position. CO2 venogram of the left lower extremity was performed first with no tourniquet in place, and a second with a tourniquet in place at the ankle. Ultrasound survey of the proximal left thigh was performed with images  stored and sent to PACs. A micropuncture needle was used access the proximal left femoral vein under ultrasound. With venous blood flow returned, and an .018 micro wire was passed through the needle, observed enter the iliac vein under fluoroscopy. The needle was removed, and a micropuncture sheath was placed over the wire. The inner dilator and wire were removed, and an 035 Bentson wire was advanced under fluoroscopy into the pelvis. The sheath was removed and a standard 5  [place] vascular sheath was placed. The dilator was removed and the sheath was flushed. CO2 venogram and then a contrast venogram of the left iliac system was performed, with no occlusion or stenosis identified. Both the pedal access and the femoral vein access were removed with compression for hemostasis. Sterile bandages were placed. Patient tolerated the procedure well and remained hemodynamically stable throughout. No complications were encountered and no significant blood loss. FINDINGS: CO2 venogram of the left tibial venous system, both the superficial and the deep (without a tourniquet and with a tourniquet in place) demonstrate patency of the deep venous system, and continuous inline through the popliteal vein and femoral vein. Venogram of the left iliac system demonstrates no occlusion and no significant stenosis. No chronic occlusion was identified for treatment. IMPRESSION: Status post left lower extremity venography, confirming patency of the deep veins of the tibial system, and patency of the iliac system. The femoral vein has been documented to be patent on prior duplex ultrasound. No obstruction was found requiring treatment. My impression is that the venous contribution to the left lower extremity chronic wound is not from obstructed vein, though is exacerbated by poor foot muscle pump, calf muscle pump, elevated central venous pressures (cardiomegaly), and dependent positioning of the wound. Signed, Dulcy Fanny. Earleen Newport, DO [facility] Radiology Electronically Signed   By: Corrie Mckusick D.O.   On: 06/17/2015 13:43   Ir US Guide Vasc Access Left  06/17/2015  INDICATION: 82 year old female with a history of chronic left lower extremity wound. Distribution of the wound suggests element of venous etiology. Prior left popliteal artery occlusion has been treated, restoring flow to the tibials. She presents today for venography, possible intervention. EXAM: LEFT EXTREMITY VENOGRAPHY; IR ULTRASOUND GUIDANCE VASC ACCESS LEFT COMPARISON:  CT 04/25/2015 MEDICATIONS: Patient received pretreatment for a contrast allergy with Solu-Medrol and Benadryl IV ANESTHESIA/SEDATION: Versed 0 mg IV; Fentanyl 25 mcg IV Moderate Sedation Time:  25 minutes The patient was continuously monitored during the procedure by the interventional radiology nurse under my direct supervision. FLUOROSCOPY TIME:  Fluoroscopy Time: 0 minutes 54 seconds (26 mGy). COMPLICATIONS: None TECHNIQUE: Informed written consent was obtained from the patient after a thorough discussion of the procedural risks, benefits and alternatives. All questions were addressed. Maximal Sterile Barrier Technique was utilized including caps, mask, sterile gowns, sterile gloves, sterile drape, hand hygiene and skin antiseptic. A timeout was performed prior to the initiation of the procedure. Ultrasound survey of the left pedal veins was performed with images stored and sent to PACs. Once the patient is prepped and draped in usual sterile fashion, the skin was infiltrated with 1% lidocaine overlying the selected pedal vein for access. A 20 gauge angiocath was then inserted into the selected pedal vein using ultrasound guidance. With confirmed blood return, 1 cc contrast was infused to confirm intravenous position. CO2 venogram of the left lower extremity was performed first with no tourniquet in place, and a second with a tourniquet in place at the ankle.  Ultrasound survey of the proximal left thigh was performed with images stored and sent to PACs. A micropuncture needle was used access the proximal left femoral vein under ultrasound. With venous blood flow returned, and an .018 micro wire was passed through the needle, observed enter the iliac vein under fluoroscopy. The needle was removed, and a micropuncture sheath was placed over the wire. The inner dilator and wire were removed, and an 035 Bentson wire was advanced under fluoroscopy into the  pelvis. The sheath was removed and a standard 5 [place] vascular sheath was placed. The dilator was removed and the sheath was flushed. CO2 venogram and then a contrast venogram of the left iliac system was performed, with no occlusion or stenosis identified. Both the pedal access and the femoral vein access were removed with compression for hemostasis. Sterile bandages were placed. Patient tolerated the procedure well and remained hemodynamically stable throughout. No complications were encountered and no significant blood loss. FINDINGS: CO2 venogram of the left tibial venous system, both the superficial and the deep (without a tourniquet and with a tourniquet in place) demonstrate patency of the deep venous system, and continuous inline through the popliteal vein and femoral vein. Venogram of the left iliac system demonstrates no occlusion and no significant stenosis. No chronic occlusion was identified for treatment. IMPRESSION: Status post left lower extremity venography, confirming patency of the deep veins of the tibial system, and patency of the iliac system. The femoral vein has been documented to be patent on prior duplex ultrasound. No obstruction was found requiring treatment. My impression is that the venous contribution to the left lower extremity chronic wound is not from obstructed vein, though is exacerbated by poor foot muscle pump, calf muscle pump, elevated central venous pressures (cardiomegaly), and  dependent positioning of the wound. Signed, Dulcy Fanny. Earleen Newport, DO [facility] Radiology Electronically Signed   By: Corrie Mckusick D.O.   On: 06/17/2015 13:43    Scheduled Meds: . aspirin EC  81 mg Oral Daily  . bisacodyl  10 mg Rectal Once  . collagenase   Topical Daily  . docusate sodium  100 mg Oral Daily  . feeding supplement (ENSURE ENLIVE)  237 mL Oral BID BM  . levothyroxine  37.5 mcg Oral QAC breakfast  . metoprolol tartrate  12.5 mg Oral BID  . multivitamin with minerals  1 tablet Oral q morning - 10a  . oxyCODONE  10 mg Oral Q12H  . piperacillin-tazobactam (ZOSYN)  IV  3.375 g Intravenous Q8H  . sodium chloride flush  3 mL Intravenous Q12H  . vancomycin  750 mg Intravenous Q24H  . warfarin  6 mg Oral ONCE-1800  . Warfarin - Pharmacist Dosing Inpatient   Does not apply q1800   Continuous Infusions: . heparin 900 Units/hr (06/18/15 1243)    Active Problems:   PAD (peripheral artery disease) (HCC)   Atrial fibrillation (HCC)   Hypothyroidism   Long term (current) use of anticoagulants   Sepsis ([place])   Leg ulcer, left (HCC)   Chronic systolic CHF (congestive heart failure) (HCC)   Cellulitis of left lower extremity   Critical lower limb ischemia   Hypotension   AKI (acute kidney injury) ([place])   Protein-calorie malnutrition, severe   Kristina Diaz K  [facility] Pager [phone]. If 7PM-7AM, please contact night-coverage at [email or web address], password [facility] 06/18/2015, 5:51 PM  LOS: 8 days

## 2015-06-18 NOTE — Evaluation (Signed)
Occupational Therapy Evaluation Patient Details Name: Kristina Diaz MRN: [id number] DOB: 1929-06-02 Today's Date: 06/18/2015    History of Present Illness 82 y.o. year-old female with history of atrial fibrillation, PVD, heart failure, LLE DVT, chronic left leg wound who presents with fever and weakness. The patient has a history of nonhealing ulcers of the left lower extremity and underwent left popliteal and tibial revascularization with PTA of the popliteal, anterior tibial, and peroneal arteries by Dr. Loreta Ave on 05/09/2015.[name] presented to the ED on 2/11 with fever, weakness, odor and drainage of Lt LE   Clinical Impression   Pt with limited participation this eval secondary to pain in the LLE and just being at the EOB with PT earlier.  Medications requested at beginning of session.  Pt currently with limitations in selfcare independence, requiring assist prior to admission, and now likely more severe secondary to new surgery and increased pain.  Feel she will benefit from acute care OT to work on progressing ADL function for return to ALF.  Pt may need greater assist and be more appropriate for SNF level.  Will continue to follow and hopefully OOB next visit.    Follow Up Recommendations  SNF;Supervision/Assistance - 24 hour    Equipment Recommendations  None recommended by OT    Recommendations for Other Services       Precautions / Restrictions Precautions Precautions: Fall Restrictions Weight Bearing Restrictions: No      Mobility Bed Mobility Overal bed mobility: Needs Assistance Bed Mobility: Supine to Sit     Supine to sit: Mod assist     General bed mobility comments: needs assist for trunk lifting  Transfers Overall transfer level: Needs assistance Equipment used: Rolling walker (2 wheeled) Transfers: Sit to/from Stand Sit to Stand: Max assist         General transfer comment: max A first attempt at sit to stand, progressed to min A with cuing and  repetition    Balance                                            ADL Overall ADL's : Needs assistance/impaired Eating/Feeding: Independent;Bed level   Grooming: Wash/dry hands;Wash/dry face;Sitting;Supervision/safety   Upper Body Bathing: Supervision/ safety;Sitting   Lower Body Bathing: Bed level;Moderate assistance Lower Body Bathing Details (indicate cue type and reason): simulated Upper Body Dressing : Minimal assistance;Sitting   Lower Body Dressing: Maximal assistance;Bed level Lower Body Dressing Details (indicate cue type and reason): simulated               General ADL Comments: Pt with increased pain in the left leg, did not want to get to EOB with this therapist this session, as she had been up earlier with PT.  Pt reports having to have assist with bathing, dressing, and toileting since the last surgery in January.  She reports having reacher, dressing stick, and sockaide that she used at home prior to this.  May need SNF for follow-up depending on progress.     Vision Vision Assessment?: No apparent visual deficits   Perception Perception Perception Tested?: No   Praxis Praxis Praxis tested?: Not tested    Pertinent Vitals/Pain Pain Assessment: 0-10 Pain Score: 8  Pain Location: left leg Pain Intervention(s): Limited activity within patient's tolerance;Monitored during session;Patient requesting pain meds-RN notified     Hand Dominance Right   Extremity/Trunk Assessment Upper  Extremity Assessment Upper Extremity Assessment: Generalized weakness (strength 4/5 throughout)   Lower Extremity Assessment Lower Extremity Assessment: Defer to PT evaluation   Cervical / Trunk Assessment Cervical / Trunk Assessment: Normal   Communication Communication Communication: No difficulties   Cognition Arousal/Alertness: Awake/alert Behavior During Therapy: WFL for tasks assessed/performed Overall Cognitive Status: Within Functional Limits  for tasks assessed                                Home Living Family/patient expects to be discharged to:: Assisted living                             Home Equipment: Walker - 2 wheels;Wheelchair - manual;Hospital bed;Tub bench;Bedside commode;Grab bars - toilet;Grab bars - tub/shower          Prior Functioning/Environment Level of Independence: Needs assistance        Comments: lives in a 2 room apt with her husband at [facility]. Mostly uses w/c for mobility. SOmetimes walks short distances with RW in apt. Has caregiver assist with toileting and in/out of bed    OT Diagnosis: Generalized weakness;Acute pain   OT Problem List: Decreased strength;Decreased activity tolerance;Impaired balance (sitting and/or standing);Decreased knowledge of use of DME or AE;Pain   OT Treatment/Interventions: Self-care/ADL training;Patient/family education;Balance training;Therapeutic activities;DME and/or AE instruction    OT Goals(Current goals can be found in the care plan section) Acute Rehab OT Goals Patient Stated Goal: To get her pain decreased and get back to being independent at [facility]. OT Goal Formulation: With patient Time For Goal Achievement: 07/02/15 Potential to Achieve Goals: Good  OT Frequency: Min 2X/week              End of Session Nurse Communication: Patient requests pain meds  Activity Tolerance: Patient limited by pain Patient left: in bed;with call bell/phone within reach   Time: 1003-1018 OT Time Calculation (min): 15 min Charges:  OT General Charges $OT Visit: 1 Procedure OT Evaluation $OT Eval Low Complexity: 1 Procedure  Alice Burnside OTR/L 06/18/2015, 10:29 AM

## 2015-06-18 NOTE — Progress Notes (Signed)
Interventional Radiology Progress Note  82 yo female with LLE chronic wound, with arterial and venous component, admitted via [facility] ED 06/10/2015 with sepsis.  Transfer to [facility] for medical therapy and vascular care.   Prior endovascular left popliteal artery and tibial artery reconstruction 05/09/2015.    Venogram performed yesterday, 06/17/2015 shows deep venous system is patent from the tibials through the popliteal, femoral, and iliac segments.  No venous occlusion or stenosis identified.    Patient is feeling fine after venogram, and reports feeling better since her admission.  Blood Cx's from 2/13 have finalized negative.  Currently with normal WBC.     She is non-ambulatory, with usage of a wheelchair at [facility].     Recommendations:  - My impression is that the venous component of her LLE wound is worsened by decreased foot-pump, calf-muscle pump, non-dependent position, and likely an element of increased central venous pressure, given her cardiomegaly.  - She will need continued wound care as an outpatient, and she understands.  - Agree with current medical support, and continue wound care as prescribed by the wound care nursing team.  Thank you - Discussed with Dr. Rhona Leavens.   VIR will follow.  Call with questions/concerns.   Signed,  Yvone Neu. Loreta Ave, DO

## 2015-06-18 NOTE — Progress Notes (Signed)
Nutrition Follow-up  DOCUMENTATION CODES:   Severe malnutrition in context of chronic illness  INTERVENTION:   -Decrease Ensure Enlive po to BID, each supplement provides 350 kcal and 20 grams of protein  NUTRITION DIAGNOSIS:   Malnutrition related to chronic illness as evidenced by severe depletion of muscle mass, severe depletion of body fat.  Ongoing  GOAL:   Patient will meet greater than or equal to 90% of their needs  Progressing  MONITOR:   PO intake, Supplement acceptance, Labs, Weight trends, Skin, I & O's  REASON FOR ASSESSMENT:   Low Braden    ASSESSMENT:   The patient is a 82 y.o. year-old female with history of atrial fibrillation, PVD, heart failure, LLE DVT, chronic left leg wound who presents with fever and weakness. The patient has a history of nonhealing ulcers of the left lower extremity and underwent left popliteal and tibial revascularization with PTA of the popliteal, anterior tibial, and peroneal arteries by Dr. Loreta Diaz on 05/09/2015. He used ultrasound-guided antegrade left SFA access an ultrasound-guided DP access of the foot. At the end of the case, the patient had a palpable pulse according to notes. Postprocedure, she had a fall and was found to have a hematoma at the left SFA access which resulted in an drop in her hemoglobin. She was initially on Plavix, however, she was changed to full dose aspirin which she was supposed to take for 30 days. She was also restarted on her Coumadin.   Pt consented to venogram on 06/12/14. Venogram completed by IR on 06/15/15, which revealed patency of veins with no occlusions or stenosis. Recommendations were to continue current wound care.  Spoke with pt at bedside, who reports her appetite remains unchanged. Meal completion 50-100%. Pt completed about 80% of her breakfast tray at bedside. Noted multiple bottles of Ensure on her bedside table. Pt estimates she consumes 1-2 Ensure supplements daily on average,  depending on how she feels. Reinforced importance of good nutritional intake to support wound healing.   Noted wt of 150# taken on 06/18/15, which is likely an outlier. UBW typically around 124#.   Reviewed CSW notes; plan to d/c back to [facility] ALF once medically stable.    Labs reviewed: Glucose: 81-138.   Diet Order:  Diet Heart Room service appropriate?: Yes; Fluid consistency:: Thin  Skin:  Wound (see comment) (anterior calf full thickness wounds. lt posterior calf full thickness wound)  Last BM:  06/18/15  Height:   Ht Readings from Last 1 Encounters:  06/10/15  (1.626 m)    Weight:   Wt Readings from Last 1 Encounters:  06/18/15 150 lb 2.1 oz (68.1 kg)    Ideal Body Weight:  54.4 kg  BMI:  Body mass index is 25.76 kg/(m^2).  Estimated Nutritional Needs:   Kcal:  1700-1900  Protein:  85-100 grams  Fluid:  1.7-1.9 L  EDUCATION NEEDS:   Education needs addressed  Kristina Diaz, RD, LDN, CDE Pager: [phone] After hours Pager: [phone]

## 2015-06-18 NOTE — Evaluation (Signed)
Physical Therapy Evaluation Patient Details Name: Kristina Diaz MRN: [id number] DOB: [birth year] Today's Date: 06/18/2015   History of Present Illness  82 y.o. year-old female with history of atrial fibrillation, PVD, heart failure, LLE DVT, chronic left leg wound who presents with fever and weakness. The patient has a history of nonhealing ulcers of the left lower extremity and underwent left popliteal and tibial revascularization with PTA of the popliteal, anterior tibial, and peroneal arteries by Dr. Loreta Ave on 05/09/2015.[name] presented to the ED on 2/11 with fever, weakness, odor and drainage of Lt LE  Clinical Impression  Pt presents with decreased strength and mobility, requiring assistance with bed mobility and transfers. Pt will need assist with all transfers at d/c, ? If this can be provided at ALF.  Pt will continue to benefit from skilled PT to improve strength and functional independence.    Follow Up Recommendations Home health PT;SNF;Supervision for mobility/OOB    Equipment Recommendations  None recommended by PT    Recommendations for Other Services       Precautions / Restrictions Precautions Precautions: Fall Restrictions Weight Bearing Restrictions: No      Mobility  Bed Mobility Overal bed mobility: Needs Assistance Bed Mobility: Supine to Sit     Supine to sit: Mod assist     General bed mobility comments: needs assist for trunk lifting  Transfers Overall transfer level: Needs assistance Equipment used: Rolling walker (2 wheeled) Transfers: Sit to/from Stand Sit to Stand: Max assist         General transfer comment: max A first attempt at sit to stand, progressed to min A with cuing and repetition  Ambulation/Gait             General Gait Details: pt states she feels too weak to walk this session, PT provided encouragement but pt states she will attempt gait "next time"  Stairs            Wheelchair Mobility    Modified Rankin (Stroke  Patients Only)       Balance                                             Pertinent Vitals/Pain Pain Assessment: No/denies pain    Home Living Family/patient expects to be discharged to:: Assisted living               Home Equipment: Walker - 2 wheels;Wheelchair - manual;Hospital bed      Prior Function Level of Independence: Needs assistance         Comments: lives in a 2 room apt with her husband at [facility]. Mostly uses w/c for mobility. SOmetimes walks short distances with RW in apt. Has caregiver assist with toileting and in/out of bed     Hand Dominance        Extremity/Trunk Assessment   Upper Extremity Assessment: Generalized weakness           Lower Extremity Assessment: Generalized weakness      Cervical / Trunk Assessment: Kyphotic  Communication   Communication: No difficulties  Cognition Arousal/Alertness: Awake/alert Behavior During Therapy: WFL for tasks assessed/performed Overall Cognitive Status: Within Functional Limits for tasks assessed                      General Comments General comments (skin integrity, edema, etc.): sitting edge of bed  with supervision, min A for static standing with RW    Exercises        Assessment/Plan    PT Assessment Patient needs continued PT services  PT Diagnosis Difficulty walking;Generalized weakness   PT Problem List Decreased strength;Decreased activity tolerance;Decreased balance;Decreased mobility  PT Treatment Interventions DME instruction;Balance training;Modalities;Gait training;Functional mobility training;Therapeutic activities;Therapeutic exercise;Wheelchair mobility training;Patient/family education;Neuromuscular re-education   PT Goals (Current goals can be found in the Care Plan section) Acute Rehab PT Goals Patient Stated Goal: go back to ALF PT Goal Formulation: With patient Time For Goal Achievement: 07/02/15 Potential to Achieve Goals:  Good    Frequency Min 3X/week   Barriers to discharge Decreased caregiver support will need assist for all transfers    Co-evaluation               End of Session Equipment Utilized During Treatment: Gait belt Activity Tolerance: Patient limited by fatigue Patient left: in bed;with call bell/phone within reach Nurse Communication: Mobility status         Time: 0920-0933 PT Time Calculation (min) (ACUTE ONLY): 13 min   Charges:   PT Evaluation $PT Eval Moderate Complexity: 1 Procedure     PT G Codes:        DONAWERTH,KAREN 07/04/15, 9:40 AM

## 2015-06-18 NOTE — Progress Notes (Addendum)
ANTICOAGULATION CONSULT NOTE - Follow Up Consult  Pharmacy Consult for heparin Indication: atrial fibrillation and DVT  Allergies  Allergen Reactions  . Bactrim Ds [Sulfamethoxazole-Trimethoprim] Rash  . Contrast Media [Iodinated Diagnostic Agents] Hives    BLE erythema   . Ciprofloxacin Rash  . Tape Rash    Patient Measurements: Height:  (162.6 cm) Weight: 150 lb 2.1 oz (68.1 kg) IBW/kg (Calculated) : 54.7  Vital Signs: Temp: 97.9 F (36.6 C) (02/21 0517) Temp Source: Oral (02/21 0517) BP: 98/61 mmHg (02/21 0517) Pulse Rate: 81 (02/21 0517)  Labs:  Recent Labs  06/16/15 0524 06/16/15 1913 06/17/15 0637 06/18/15 0643  HGB 9.4*  --  9.9* 10.0*  HCT 31.0*  --  31.3* 33.7*  PLT 178  --  170 170  HEPARINUNFRC 0.75* 0.73* 0.46 0.47  CREATININE 1.32*  --  1.40* 1.22*    Estimated Creatinine Clearance: 33.1 mL/min (by C-G formula based on Cr of 1.22).   Medications:  Infusions:  . heparin 900 Units/hr (06/17/15 0553)    Assessment: 82 y/o female on IV heparin while warfarin on hold for Afib and hx DVT. Heparin level is therapeutic at 0.47. No bleeding noted, CBC is stable. Recent angioplasty of LLE 04/2015, LLE venogram with patent vessels on 2/20.  PTA warfarin regimen: 4.5 mg daily except 3 mg on Tuesdays/Fridays per clinic note  Goal of Therapy:  INR 2-3 Monitor platelets by anticoagulation protocol: Yes   Plan:  - Continue heparin drip at 900 units/hr - Daily HL and CBC - Follow-up resuming warfarin  Gi Or Norman, [place].D., BCPS Clinical Pharmacist Pager: [phone] 06/18/2015 8:37 AM   Addendum: Consulted to resume warfarin. Last INR was 1.64 on 2/17.  Warfarin 6 mg PO tonight Daily INR  [facility], [address].D., BCPS Clinical Pharmacist Pager: [phone] 06/18/2015 2:25 PM

## 2015-06-19 DIAGNOSIS — Z7901 Long term (current) use of anticoagulants: Secondary | ICD-10-CM

## 2015-06-19 DIAGNOSIS — I482 Chronic atrial fibrillation: Secondary | ICD-10-CM

## 2015-06-19 DIAGNOSIS — L03116 Cellulitis of left lower limb: Secondary | ICD-10-CM

## 2015-06-19 DIAGNOSIS — I9589 Other hypotension: Secondary | ICD-10-CM

## 2015-06-19 LAB — CBC
HEMATOCRIT: 32 % — AB (ref 36.0–46.0)
HEMOGLOBIN: 9.9 g/dL — AB (ref 12.0–15.0)
MCH: 30.3 pg (ref 26.0–34.0)
MCHC: 30.9 g/dL (ref 30.0–36.0)
MCV: 97.9 fL (ref 78.0–100.0)
Platelets: 172 10*3/uL (ref 150–400)
RBC: 3.27 MIL/uL — ABNORMAL LOW (ref 3.87–5.11)
RDW: 20.6 % — ABNORMAL HIGH (ref 11.5–15.5)
WBC: 10.4 10*3/uL (ref 4.0–10.5)

## 2015-06-19 LAB — BASIC METABOLIC PANEL
ANION GAP: 7 (ref 5–15)
BUN: 24 mg/dL — ABNORMAL HIGH (ref 6–20)
CHLORIDE: 105 mmol/L (ref 101–111)
CO2: 28 mmol/L (ref 22–32)
Calcium: 9 mg/dL (ref 8.9–10.3)
Creatinine, Ser: 1.29 mg/dL — ABNORMAL HIGH (ref 0.44–1.00)
GFR calc non Af Amer: 37 mL/min — ABNORMAL LOW (ref >60)
GFR, EST AFRICAN AMERICAN: 43 mL/min — AB (ref >60)
Glucose, Bld: 98 mg/dL (ref 65–99)
Potassium: 4 mmol/L (ref 3.5–5.1)
Sodium: 140 mmol/L (ref 135–145)

## 2015-06-19 LAB — APTT: aPTT: 124 seconds — ABNORMAL HIGH (ref 24–37)

## 2015-06-19 LAB — PROTIME-INR
INR: 1.32 (ref 0.00–1.49)
Prothrombin Time: 16.5 seconds — ABNORMAL HIGH (ref 11.6–15.2)

## 2015-06-19 LAB — HEPARIN LEVEL (UNFRACTIONATED): Heparin Unfractionated: 0.48 IU/mL (ref 0.30–0.70)

## 2015-06-19 MED ORDER — ONDANSETRON HCL 4 MG/2ML IJ SOLN
4.0000 mg | Freq: Four times a day (QID) | INTRAMUSCULAR | Status: DC | PRN
  Administered 2015-06-19: 4 mg via INTRAVENOUS
  Filled 2015-06-19 (×2): qty 2

## 2015-06-19 MED ORDER — PROMETHAZINE HCL 25 MG PO TABS
12.5000 mg | ORAL_TABLET | Freq: Once | ORAL | Status: AC
  Administered 2015-06-19: 12.5 mg via ORAL
  Filled 2015-06-19: qty 1

## 2015-06-19 MED ORDER — WARFARIN SODIUM 6 MG PO TABS
6.0000 mg | ORAL_TABLET | Freq: Once | ORAL | Status: AC
  Administered 2015-06-19: 6 mg via ORAL
  Filled 2015-06-19: qty 1

## 2015-06-19 NOTE — Progress Notes (Signed)
ANTICOAGULATION CONSULT NOTE - Follow Up Consult  Pharmacy Consult for heparin to warfarin Indication: atrial fibrillation and DVT  Allergies  Allergen Reactions  . Bactrim Ds [Sulfamethoxazole-Trimethoprim] Rash  . Contrast Media [Iodinated Diagnostic Agents] Hives    BLE erythema   . Ciprofloxacin Rash  . Tape Rash    Patient Measurements: Height:  (162.6 cm) Weight: 148 lb 13 oz (67.5 kg) IBW/kg (Calculated) : 54.7  Vital Signs: Temp: 98.3 F (36.8 C) (02/22 0621) Temp Source: Oral (02/22 0621) BP: 102/44 mmHg (02/22 0621) Pulse Rate: 81 (02/22 0621)  Labs:  Recent Labs  06/17/15 0637 06/18/15 0643 06/19/15 0639  HGB 9.9* 10.0* 9.9*  HCT 31.3* 33.7* 32.0*  PLT 170 170 172  HEPARINUNFRC 0.46 0.47 0.48  CREATININE 1.40* 1.22*  --     Estimated Creatinine Clearance: 33 mL/min (by C-G formula based on Cr of 1.22).   Medications:  Infusions:  . heparin 900 Units/hr (06/18/15 1243)    Assessment: 82 y/o female on IV heparin while warfarin on hold for Afib and hx DVT. Heparin level is therapeutic at 0.47. INR is subtherapeutic at 1.32 today. No bleeding noted, CBC is stable. Recent angioplasty of LLE 04/2015, LLE venogram with patent vessels on 2/20.  PTA warfarin regimen: 4.5 mg daily except 3 mg on Tuesdays/Fridays per clinic note  Goal of Therapy:  INR 2-3 Monitor platelets by anticoagulation protocol: Yes   Plan:  - Continue heparin drip at 900 units/hr - Repeat warfarin 6 mg PO tonight - Daily HL, INR and CBC - Monitor for s/sx of bleeding  [facility], [place].D., BCPS Clinical Pharmacist Pager: [phone] 06/19/2015 9:15 AM

## 2015-06-19 NOTE — Progress Notes (Signed)
[facility] PROGRESS NOTE  Ayaan Ringle KTG:[id number] DOB: July 29, 1929 DOA: 06/10/2015 PCP: Nyoka Cowden, MD  HPI/Brief narrative See admit h and p from 2/13 for details. Briefly, 82 y.o. year-old female with history of atrial fibrillation, PVD, heart failure, LLE DVT, chronic left leg wound who presents with fever and weakness. The patient has a history of nonhealing ulcers of the left lower extremity and underwent left popliteal and tibial revascularization with PTA of the popliteal, anterior tibial, and peroneal arteries by Dr. Earleen Newport on 05/09/2015. Dr. Earleen Newport also suspects she has significant venous disease contributing to her worsening left leg wounds. She has been started on broad-spectrum antibiotics while she thinks about the possibility of undergoing a venogram with intervention for limb salvage.   Assessment/Plan: Severe sepsis (fever to 101F at facility, tachycardia), possibly due to left lower extremity infected ulcer with surrounding cellulitis.Known peripheral arterial disease and venous disease. Hemodynamically stable- Blood cultures NGTD - ESR 77 and CRP 16.1, - d/c IVF - UA neg - Continued vancomycin and zosyn since 2/13. Will complete course after one more day of tx for total of 10 days of antibiotics -bp recently soft , thus held lisinopril/demadex, continue betablocker for rate control with holding parameters. -Patient stable at present  Peripheral arterial disease s/p recent angioplasty of LLE 04/2015 by Dr. Earleen Newport - Continue aspirin - Heparin gtt now - Pt underwent venogram 2/20 with patent vessels - Will resume coumadin per pharmacy dosing  Chronic atrial fibrillation, -now on heparin drip, in chronic afib, -Currently rate controlled on beta blocker -coumadin resumed on 2/21 post-venogram, pharmacy to dose  NSVT: patient asymptomatic, continue low dose betablocker for now.  Chronic diastolic and systolic heart failure, echocardiogram this  admission shows ejection fraction of 35%. Has range 30-40% in the past. resumed torsemide but once a day rather than twice daily. Weights are stable. 2/18: bp soft, d/c'd lisinopril, held torsemide.  H/o left lower extremity DVT s/p thrombectomy in 2014, repeat venous US during this admission on 2/14 neg for acute DVT,  patient has been on chronic anticoagulation  AKI, creatinine 1.4 on admission. 1.2 currently  Kyphosis, severe compression fracture of L4. stable  Hypothyroidism, stable, continue sythroid  Code Status: DNR Family Communication: Pt in room Disposition Plan: Possible d/c in 24hrs   Consultants:  Dr. Earleen Newport  Procedures:  Duplex left leg, venous  Venogram 2/20  Antibiotics: Anti-infectives    Start     Dose/Rate Route Frequency Ordered Stop   06/11/15 1600  vancomycin (VANCOCIN) IVPB 750 mg/150 ml premix     750 mg 150 mL/hr over 60 Minutes Intravenous Every 24 hours 06/10/15 1459 06/19/15 1600   06/10/15 2200  piperacillin-tazobactam (ZOSYN) IVPB 3.375 g     3.375 g 12.5 mL/hr over 240 Minutes Intravenous Every 8 hours 06/10/15 1459 06/19/15 1751   06/10/15 1500  piperacillin-tazobactam (ZOSYN) IVPB 3.375 g     3.375 g 100 mL/hr over 30 Minutes Intravenous  Once 06/10/15 1448 06/10/15 1553   06/10/15 1500  vancomycin (VANCOCIN) IVPB 1000 mg/200 mL premix     1,000 mg 200 mL/hr over 60 Minutes Intravenous  Once 06/10/15 1448 06/10/15 1658      HPI/Subjective: Patient denies any complaints this morning  Objective: Filed Vitals:   06/18/15 2232 06/19/15 0621 06/19/15 0942 06/19/15 1440  BP: 94/46 102/44 108/51 103/46  Pulse: 91 81 80 66  Temp: 98.6 F (37 C) 98.3 F (36.8 C)  98.3 F (36.8 C)  TempSrc: Axillary Oral  Oral  Resp: _0 Height:      Weight:  67.5 kg (148 lb 13 oz)    SpO2: 98% 99%  96%    Intake/Output Summary (Last 24 hours) at 06/19/15 1815 Last data filed at 06/19/15 1442  Gross per 24 hour  Intake 612.95 ml   Output      0 ml  Net 612.95 ml   Filed Weights   06/17/15 0351 06/18/15 0517 06/19/15 3500  Weight: 65.5 kg (144 lb 6.4 oz) 68.1 kg (150 lb 2.1 oz) 67.5 kg (148 lb 13 oz)    Exam:   General:  Appears in no acute distress  Cardiovascular: S1-S2 regular  Respiratory: Clear to auscultation bilaterally  Abdomen: soft, nondistended, no organomegaly  Musculoskeletal: LE dressings in place, no clubbing, LLE wound with granulation appears to be healing  Data Reviewed: Basic Metabolic Panel:  Recent Labs Lab 06/14/15 0833 06/15/15 0536 06/16/15 0524 06/17/15 0637 06/18/15 0643 06/19/15 1027  NA 146* 144 144 143 140 140  K 3.6 3.9 3.7 3.8 4.1 4.0  CL 112* 110 109 106 103 105  CO2 _1 GLUCOSE 84 90 87 81 138* 98  BUN 33* 34* 29* 27* 26* 24*  CREATININE 1.28* 1.43* 1.32* 1.40* 1.22* 1.29*  CALCIUM 8.4* 8.5* 8.5* 8.9 9.2 9.0  MG 2.0  --  2.1  --   --   --     CBC:  Recent Labs Lab 06/15/15 0536 06/16/15 0524 06/17/15 0637 06/18/15 0643 06/19/15 0639  WBC 8.1 7.5 7.8 10.1 10.4  HGB 9.5* 9.4* 9.9* 10.0* 9.9*  HCT 31.2* 31.0* 31.3* 33.7* 32.0*  MCV 94.3 94.5 96.3 95.2 97.9  PLT 171 178 170 170 172     Recent Results (from the past 240 hour(s))  Culture, blood (routine x 2)     Status: None   Collection Time: 06/10/15  2:20 PM  Result Value Ref Range Status   Specimen Description BLOOD RIGHT FOREARM  Final   Special Requests BOTTLES DRAWN AEROBIC AND ANAEROBIC 5 CC EA  Final   Culture   Final    NO GROWTH 5 DAYS Performed at [facility]    Report Status 06/15/2015 FINAL  Final  Culture, blood (routine x 2)     Status: None   Collection Time: 06/10/15  2:25 PM  Result Value Ref Range Status   Specimen Description BLOOD RIGHT ANTECUBITAL  Final   Special Requests BOTTLES DRAWN AEROBIC AND ANAEROBIC 5 CC EA  Final   Culture   Final    NO GROWTH 5 DAYS Performed at [facility]    Report Status 06/15/2015 FINAL  Final  Urine  culture     Status: None   Collection Time: 06/11/15  8:47 AM  Result Value Ref Range Status   Specimen Description URINE, CATHETERIZED  Final   Special Requests NONE  Final   Culture   Final    NO GROWTH 1 DAY Performed at [facility]    Report Status 06/12/2015 FINAL  Final  MRSA PCR Screening     Status: None   Collection Time: 06/11/15 10:50 PM  Result Value Ref Range Status   MRSA by PCR NEGATIVE NEGATIVE Final    Comment:        The GeneXpert MRSA Assay (FDA approved for NASAL specimens only), is one component of a comprehensive MRSA colonization surveillance program. It is not intended to diagnose MRSA infection nor  to guide or monitor treatment for MRSA infections.      Studies: No results found.  Scheduled Meds: . aspirin EC  81 mg Oral Daily  . bisacodyl  10 mg Rectal Once  . collagenase   Topical Daily  . docusate sodium  100 mg Oral Daily  . feeding supplement (ENSURE ENLIVE)  237 mL Oral BID BM  . levothyroxine  37.5 mcg Oral QAC breakfast  . metoprolol tartrate  12.5 mg Oral BID  . multivitamin with minerals  1 tablet Oral q morning - 10a  . oxyCODONE  10 mg Oral Q12H  . sodium chloride flush  3 mL Intravenous Q12H  . Warfarin - Pharmacist Dosing Inpatient   Does not apply q1800   Continuous Infusions: . heparin 900 Units/hr (06/19/15 1729)    Active Problems:   PAD (peripheral artery disease) (HCC)   Atrial fibrillation (HCC)   Hypothyroidism   Long term (current) use of anticoagulants   Sepsis (HCC)   Leg ulcer, left (HCC)   Chronic systolic CHF (congestive heart failure) (HCC)   Cellulitis of left lower extremity   Critical lower limb ischemia   Hypotension   AKI (acute kidney injury) ([place])   Protein-calorie malnutrition, severe   Leroi Haque S  [facility] Pager [phone] If 7PM-7AM, please contact night-coverage at [email or web address], password [facility] 06/19/2015, 6:15 PM  LOS: 9 days

## 2015-06-19 NOTE — Progress Notes (Signed)
MD at bedside and made aware of patient's persistent nausea with no relief after PRN zofran. Patient able to take pills this am. Verbal order received.

## 2015-06-19 NOTE — Progress Notes (Signed)
CSW spoke with pt and pt son, Molli Hazard, at bedside concerning plan for discharge.  Pt and son continue to want pt to return to [facility] as first choice due to pt spouse being there.  CSW explained PT concerns and pt son believes pt refusal today was mainly due to medication reactions. Son also stated that pt was already dependent with transfers at ALF- ALF also able to provide food to pt room for limited time while she recovers.  CSW will follow up with [facility] in the morning to confirm patient can return- per MD note possible DC tomorrow  Merlyn Lot, [facility] Clinical Social Worker [phone]

## 2015-06-19 NOTE — Progress Notes (Signed)
Patient had a 5 beat run of VTach on telemetry.  On-call NP Schorr notified.

## 2015-06-19 NOTE — Progress Notes (Cosign Needed)
Physical Therapy Treatment Patient Details Name: Kristina Diaz MRN: [id number] DOB: [birth year] Today's Date: 06/19/2015    History of Present Illness 82 y.o. year-old female with history of atrial fibrillation, PVD, heart failure, LLE DVT, chronic left leg wound who presents with fever and weakness. The patient has a history of nonhealing ulcers of the left lower extremity and underwent left popliteal and tibial revascularization with PTA of the popliteal, anterior tibial, and peroneal arteries by Dr. Loreta Ave on 05/09/2015.[name] presented to the ED on 2/11 with fever, weakness, odor and drainage of Lt LE    PT Comments    Pt refusing OOB mobility will likely need SNF placement as weakness remains during therapeutic exercise in supine position.  Will inform supervising PT about change in d/c recommendations.    Follow Up Recommendations  Home health PT;SNF;Supervision for mobility/OOB (likely will need SNF due to refusal for mobility and significant weakness noted during therapeutic exercise.)     Equipment Recommendations  None recommended by PT    Recommendations for Other Services       Precautions / Restrictions Precautions Precautions: Fall Restrictions Weight Bearing Restrictions: No    Mobility  Bed Mobility Overal bed mobility:  (refused bed mobility, transfers and gait training.  pt educated on importance of mobility.  Pt willing to perform therapeutic exercise.  )                Transfers                    Ambulation/Gait                 Stairs            Wheelchair Mobility    Modified Rankin (Stroke Patients Only)       Balance                                    Cognition Arousal/Alertness: Awake/alert Behavior During Therapy: WFL for tasks assessed/performed Overall Cognitive Status: Within Functional Limits for tasks assessed                      Exercises General Exercises - Lower Extremity Ankle  Circles/Pumps: Both;Supine;10 reps;AROM;AAROM Quad Sets: 10 reps;Supine;Both;AROM Heel Slides: AAROM;Both;10 reps;Supine Hip ABduction/ADduction: AAROM;Both;10 reps;Supine Straight Leg Raises: AAROM;Both;10 reps;Supine    General Comments General comments (skin integrity, edema, etc.): not formally assessed.        Pertinent Vitals/Pain Pain Assessment: Faces Faces Pain Scale: Hurts little more Pain Location: Left leg to touch    Home Living                      Prior Function            PT Goals (current goals can now be found in the care plan section) Acute Rehab PT Goals Potential to Achieve Goals: Fair Progress towards PT goals: Not progressing toward goals - comment (refusing OOB activity)    Frequency  Min 3X/week    PT Plan      Co-evaluation             End of Session   Activity Tolerance: Patient limited by fatigue Patient left: in bed;with call bell/phone within reach;with bed alarm set     Time: 4098-1191 PT Time Calculation (min) (ACUTE ONLY): 14 min  Charges:  $Therapeutic Exercise: 8-22 mins  G Codes:      Florestine Avers 06/19/2015, 4:37 PM  Joycelyn Rua, PTA pager [phone]

## 2015-06-20 DIAGNOSIS — I739 Peripheral vascular disease, unspecified: Secondary | ICD-10-CM

## 2015-06-20 DIAGNOSIS — N179 Acute kidney failure, unspecified: Secondary | ICD-10-CM

## 2015-06-20 DIAGNOSIS — I5022 Chronic systolic (congestive) heart failure: Secondary | ICD-10-CM

## 2015-06-20 LAB — MAGNESIUM: Magnesium: 2.2 mg/dL (ref 1.7–2.4)

## 2015-06-20 LAB — CBC
HCT: 33.4 % — ABNORMAL LOW (ref 36.0–46.0)
Hemoglobin: 10 g/dL — ABNORMAL LOW (ref 12.0–15.0)
MCH: 28.8 pg (ref 26.0–34.0)
MCHC: 29.9 g/dL — ABNORMAL LOW (ref 30.0–36.0)
MCV: 96.3 fL (ref 78.0–100.0)
PLATELETS: 172 10*3/uL (ref 150–400)
RBC: 3.47 MIL/uL — AB (ref 3.87–5.11)
RDW: 20.8 % — ABNORMAL HIGH (ref 11.5–15.5)
WBC: 7.6 10*3/uL (ref 4.0–10.5)

## 2015-06-20 LAB — PROTIME-INR
INR: 1.27 (ref 0.00–1.49)
PROTHROMBIN TIME: 16 s — AB (ref 11.6–15.2)

## 2015-06-20 LAB — HEPARIN LEVEL (UNFRACTIONATED): Heparin Unfractionated: 0.52 IU/mL (ref 0.30–0.70)

## 2015-06-20 MED ORDER — ENOXAPARIN SODIUM 80 MG/0.8ML ~~LOC~~ SOLN
70.0000 mg | SUBCUTANEOUS | Status: DC
  Administered 2015-06-20: 70 mg via SUBCUTANEOUS
  Filled 2015-06-20: qty 0.8

## 2015-06-20 MED ORDER — ENOXAPARIN SODIUM 80 MG/0.8ML ~~LOC~~ SOLN: 70.0000 mg | SUBCUTANEOUS | Status: DC

## 2015-06-20 MED ORDER — OXYCODONE HCL 5 MG PO TABS: 5.0000 mg | ORAL_TABLET | Freq: Four times a day (QID) | ORAL | Status: DC | PRN

## 2015-06-20 MED ORDER — OXYCODONE HCL ER 10 MG PO T12A: 10.0000 mg | EXTENDED_RELEASE_TABLET | Freq: Two times a day (BID) | ORAL | Status: DC

## 2015-06-20 MED ORDER — WARFARIN SODIUM 6 MG PO TABS
6.0000 mg | ORAL_TABLET | Freq: Once | ORAL | Status: DC
  Filled 2015-06-20: qty 1

## 2015-06-20 MED ORDER — ENSURE ENLIVE PO LIQD: 237.0000 mL | Freq: Two times a day (BID) | ORAL | Status: DC

## 2015-06-20 NOTE — NC FL2 (Signed)
MEDICAID FL2 LEVEL OF CARE SCREENING TOOL     IDENTIFICATION  Patient Name: Kristina Diaz Birthdate: [birth year] Sex: female Admission Date (Current Location): 06/10/2015  [facility] and [place] Number:  Producer, television/film/video and Address:  [facility] [place], [address], [place], [place] [address]      Provider Number: [id number]  Attending Physician Name and Address:  Meredeth Ide, MD  Relative Name and Phone Number:       Current Level of Care: Hospital Recommended Level of Care: Assisted Living Facility Prior Approval Number:    Date Approved/Denied:   PASRR Number:    Discharge Plan: Other (Comment) (Assisted Living Facility)    Current Diagnoses: Patient Active Problem List   Diagnosis Date Noted  . Protein-calorie malnutrition, severe 06/13/2015  . AKI (acute kidney injury) (HCC) 06/10/2015  . Syncope 05/10/2015  . Hypotension 05/10/2015  . Anemia, blood loss 05/10/2015  . Fall 05/10/2015  . Hematoma   . DVT (deep vein thrombosis) in pregnancy   . Critical lower limb ischemia   . Pressure ulcer 01/25/2015  . Blood poisoning (HCC)   . Atrial fibrillation with RVR (HCC)   . Chronic systolic CHF (congestive heart failure) (HCC)   . Cellulitis of left lower extremity   . Sepsis (HCC) 01/23/2015  . Leg ulcer, left (HCC)   . Encounter for therapeutic drug monitoring 05/31/2013  . Chronic combined systolic and diastolic heart failure (HCC) 12/10/2012  . Long term (current) use of anticoagulants 08/22/2012  . Chronic diastolic heart failure 08/18/2012  . Wound of lower extremity 08/08/2012  . Acute on chronic diastolic heart failure (HCC) 08/07/2012  . Hypothyroidism 08/07/2012  . Right heart failure (HCC) 08/04/2012  . Edema 07/26/2012  . Atherosclerotic peripheral vascular disease with ulceration (HCC) 06/30/2012  . PAD (peripheral artery disease) (HCC)   . Atrial fibrillation (HCC)   . Hypertension   . Thyroid disease      Orientation RESPIRATION BLADDER Height & Weight     Self, Time, Situation, Place  Normal Continent Weight: 149 lb 4 oz (67.7 kg) Height:   (162.6 cm)  BEHAVIORAL SYMPTOMS/MOOD NEUROLOGICAL BOWEL NUTRITION STATUS      Continent No added salt  AMBULATORY STATUS COMMUNICATION OF NEEDS Skin   Limited Assist Verbally Normal                       Personal Care Assistance Level of Assistance  Bathing, Dressing Bathing Assistance: Limited assistance   Dressing Assistance: Limited assistance     Functional Limitations Info             SPECIAL CARE FACTORS FREQUENCY   Home Health PT  Home Health OT  Home Health RN for lovenox injection  3/wk    3/wk              Contractures      Additional Factors Info  Code Status, Allergies Code Status Info: DNR Allergies Info: Bactrim Ds, Contrast Media, Ciprofloxacin, Tape            Discharge Medications: START taking these medications   Details  enoxaparin (LOVENOX) 80 MG/0.8ML injection Inject 0.7 mLs (70 mg total) into the skin daily. Qty: 22.4 Syringe, Refills: 0    feeding supplement, ENSURE ENLIVE, (ENSURE ENLIVE) LIQD Take 237 mLs by mouth 2 (two) times daily between meals. Qty: 237 mL, Refills: 12      CONTINUE these medications which have CHANGED  Details  oxyCODONE (OXY IR/ROXICODONE) 5 MG immediate release tablet Take 1 tablet (5 mg total) by mouth every 6 (six) hours as needed for pain. Qty: 30 tablet, Refills: 0    oxyCODONE (OXYCONTIN) 10 mg 12 hr tablet Take 1 tablet (10 mg total) by mouth every 12 (twelve) hours. Qty: 30 tablet, Refills: 0      CONTINUE these medications which have NOT CHANGED   Details  Amino Acids-Protein Hydrolys (FEEDING SUPPLEMENT, PRO-STAT SUGAR FREE 64,) LIQD Take 30 mLs by mouth 2 (two) times daily.    aspirin EC 81 MG EC tablet Take 1 tablet (81 mg total) by mouth daily. Qty: 30 tablet, Refills: 0    docusate sodium (COLACE)  100 MG capsule Take 100 mg by mouth daily.    levothyroxine (SYNTHROID, LEVOTHROID) 25 MCG tablet TAKE 1 AND 1/2 TABS (37.5MG ) BY MOUTH EVERY DAY*CHECK PULSE WEEKLY* Qty: 45 tablet, Refills: 11    metoprolol succinate (TOPROL-XL) 25 MG 24 hr tablet Take 0.5 tablets (12.5 mg total) by mouth daily. Qty: 30 tablet, Refills: 11    Multiple Vitamin (MULTIVITAMIN WITH MINERALS) TABS Take 1 tablet by mouth every morning.    torsemide (DEMADEX) 20 MG tablet Take 20 mg by mouth every Monday, Wednesday, and Friday. Reported on 06/10/2015    warfarin (COUMADIN) 3 MG tablet Take 4.5mg  (one and a half pills) everyday except Tuesday and Friday. Take  (one pill) on Tuesday and Friday. Qty: 60 tablet, Refills: 0      STOP taking these medications     potassium chloride (K-DUR,KLOR-CON) 10 MEQ tablet      UNABLE TO FIND         Relevant Imaging Results:  Relevant Lab Results:   Additional Information SS#: [id number]  Izora Ribas, [place]

## 2015-06-20 NOTE — Progress Notes (Signed)
Spoke with Davonna Belling RN liaison [facility], requested [facility] RN be available tomorrow to assist patient with Lovenox administration. She stated that should not be a problem.

## 2015-06-20 NOTE — Care Management Note (Signed)
Case Management Note  Patient Details  Name: Kristina Diaz MRN: [id number] Date of Birth: 09-Jan-1930  Subjective/Objective:                  Spoke with patient at the bedside. She is from [facility], where she lives with her husband who is on oxygen "has one lung" but also help to take care of her per her report. She states that she has a cane, walker, wheelchair and heavy assist from staff who bath and dress her and take her to the bathroom. [facility] and has [facility] RN take care of her leg wound twice a week. She also gets PT from [place]. Patient has been approved for HRI with [facility] Kristina Diaz does not participate with HRI) to get services at home daily initially after discharge, but patient is not willing to change caregivers at this time  Action/Plan:  Patient continues to decline HRI services and SNF placement. Patient will to [facility] today. LM with intake RN, did not receive call back.  Expected Discharge Date:                  Expected Discharge Plan:  Home w Home Health Services  In-House Referral:  Clinical Social Work  Discharge planning Services  CM Consult  Post Acute Care Choice:    Choice offered to:     DME Arranged:    DME Agency:     HH Arranged:  RN, PT, OT, Nurse's Aide, Social Work [employer] Agency:  [facility]  Status of Service:  Completed, signed off  Medicare Important Message Given:  Yes Date Medicare IM Given:    Medicare IM give by:    Date Additional Medicare IM Given:    Additional Medicare Important Message give by:     If discussed at Long Length of Stay Meetings, dates discussed:    Additional Comments:  Lawerance Sabal, RN 06/20/2015, 1:57 PM

## 2015-06-20 NOTE — Progress Notes (Signed)
Pharmacist Provided - Patient Medication Education Prior to Discharge   Kristina Diaz is an 82 y.o. female who presented to [facility] on 06/10/2015 with a chief complaint of  Chief Complaint  Patient presents with  . Fever      Patient will be discharged with new medications  Patient being discharged without any new medications  The following medications were discussed with the patient:  Pain Control medications:  Yes     No  Diabetes Medications:  Yes     No  Heart Failure Medications:  Yes     No  Anticoagulation Medications:   Yes     No  Antibiotics at discharge:  Yes     No  Allergy Assessment Completed and Updated:  Yes     No Identified Patient Allergies:  Allergies  Allergen Reactions  . Bactrim Ds [Sulfamethoxazole-Trimethoprim] Rash  . Contrast Media [Iodinated Diagnostic Agents] Hives    BLE erythema   . Ciprofloxacin Rash  . Tape Rash     Medication Adherence Assessment:  Excellent (no doses missed/week)       Good (1 dose missed/week)       Partial (2-3 doses missed/week)       Poor (>3 doses missed/week)  Barriers to Obtaining Medications:  Yes  No  Assessment: 82 y/o F with sepsis. Completed course of abx. Also on Lovenox bridge to warfarin for afib. Counseled patient on discharge anticoagulation plan and side effects to monitor for. Pt verbalized understanding of the treatment plan.   Time spent preparing for discharge counseling: 15 min Time spent counseling patient: 15 min  [place] Pink, PharmD 06/20/2015, 4:17 PM

## 2015-06-20 NOTE — Consult Note (Signed)
WOC wound consult note Re-Consult requested to assess left leg; refer to previous progress notes on 2/15 Reason for Consult: Pt is followed as an outpatient by the wound care center. She had a re-vascularization procedure according to the EMR. She uses Santyl prior to admission and relieves serial debridements. Wound type: Chronic full thickness stasis ulcers wounds to left calf; these are very painful. Measurement: Anterior calf full thickness wounds have improved in appearance and are much less painful than previous consult;  2X2X.1cm, 2X2X.1cm and .3X.3X.1cm, all 80% red, 20% yellow moist wound bed without odor, small amt yellow drainage. Left posterior calf full thickness wound; 15X14X.2cm, 70% red, 30% yellow, large amt yellow-red drainage no odor. Dressing procedure/placement/frequency: Continue present plan of care as previously ordered with Santyl for chemical debridement, and ace wrap for light compression. Discussed plan of care and pt verbalized understanding. Pt can resume follow-up with the outpatient wound care center after discharge. Please re-consult if further assistance is needed. Thank-you,  Cammie Mcgee MSN, RN, CWOCN, [place], CNS [phone]

## 2015-06-20 NOTE — Progress Notes (Signed)
Physical Therapy Treatment Patient Details Name: Kristina Diaz MRN: [id number] DOB: 1929/06/13 Today's Date: 06/20/2015    History of Present Illness 82 y.o. year-old female with history of atrial fibrillation, PVD, heart failure, LLE DVT, chronic left leg wound who presents with fever and weakness. The patient has a history of nonhealing ulcers of the left lower extremity and underwent left popliteal and tibial revascularization with PTA of the popliteal, anterior tibial, and peroneal arteries by Dr. Loreta Ave on 05/09/2015.[name] presented to the ED on 2/11 with fever, weakness, odor and drainage of Lt LE    PT Comments    Pt performed increased activity performing bed to chair with minimal-moderate assist.  Pt fatigues quickly and unable to advance steps backwards to seated surface secondary to weakness.    Follow Up Recommendations  Home health PT;SNF;Supervision for mobility/OOB     Equipment Recommendations  None recommended by PT    Recommendations for Other Services       Precautions / Restrictions Precautions Precautions: Fall Restrictions Weight Bearing Restrictions: No    Mobility  Bed Mobility Overal bed mobility: Needs Assistance Bed Mobility: Supine to Sit     Supine to sit: Min guard;Mod assist     General bed mobility comments: Pt able to sit edge of bed with min guard assist required mod assist to advance to edge of bed.    Transfers Overall transfer level: Needs assistance Equipment used: Rolling walker (2 wheeled) Transfers: Sit to/from Stand Sit to Stand: Mod assist         General transfer comment: pt unable to stand erect required mod assist to maintain balance.  Pt required tactile cueing for hand placement to improve ease.    Ambulation/Gait Ambulation/Gait assistance: Mod assist Ambulation Distance (Feet): 6 Feet Assistive device: Rolling walker (2 wheeled) Gait Pattern/deviations: Decreased step length - left;Decreased stance time -  right;Decreased stance time - left;Decreased stride length;Leaning posteriorly;Shuffle   Gait velocity interpretation: Below normal speed for age/gender General Gait Details: Pt able to perform shuffle steps from bed to chair.  pt unable to back completely to chair and required chair to be pushed up behind her.  pt fatigues easily and quickly.     Stairs            Wheelchair Mobility    Modified Rankin (Stroke Patients Only)       Balance                                    Cognition Arousal/Alertness: Awake/alert Behavior During Therapy: WFL for tasks assessed/performed Overall Cognitive Status: Within Functional Limits for tasks assessed                      Exercises      General Comments General comments (skin integrity, edema, etc.): not formally assessed.        Pertinent Vitals/Pain Pain Assessment: Faces Faces Pain Scale: Hurts little more Pain Location: Left leg toe weight bearing only.   Pain Intervention(s): Monitored during session;Repositioned    Home Living                      Prior Function            PT Goals (current goals can now be found in the care plan section) Acute Rehab PT Goals Patient Stated Goal: To get her pain decreased and get back  to being independent at [facility]. Potential to Achieve Goals: Fair Progress towards PT goals: Progressing toward goals    Frequency  Min 3X/week    PT Plan      Co-evaluation             End of Session Equipment Utilized During Treatment: Gait belt Activity Tolerance: Patient limited by fatigue Patient left: with call bell/phone within reach;in chair;with chair alarm set     Time: 1610-9604 PT Time Calculation (min) (ACUTE ONLY): 19 min  Charges:  $Therapeutic Activity: 8-22 mins                    G Codes:      Florestine Avers 06-27-15, 1:26 PM Joycelyn Rua, PTA pager [phone]

## 2015-06-20 NOTE — Progress Notes (Signed)
ANTICOAGULATION CONSULT NOTE - Follow Up Consult  Pharmacy Consult for Heparin >> Lovenox + Warfarin Indication: atrial fibrillation and DVT  Allergies  Allergen Reactions  . Bactrim Ds [Sulfamethoxazole-Trimethoprim] Rash  . Contrast Media [Iodinated Diagnostic Agents] Hives    BLE erythema   . Ciprofloxacin Rash  . Tape Rash    Patient Measurements: Height:  (162.6 cm) Weight: 149 lb 4 oz (67.7 kg) IBW/kg (Calculated) : 54.7  Vital Signs: Temp: 98.2 F (36.8 C) (02/23 0523) Temp Source: Oral (02/23 0523) BP: 104/50 mmHg (02/23 0906) Pulse Rate: 81 (02/23 0906)  Labs:  Recent Labs  06/18/15 0643 06/19/15 0639 06/19/15 1027 06/20/15 0535  HGB 10.0* 9.9*  --  10.0*  HCT 33.7* 32.0*  --  33.4*  PLT 170 172  --  172  APTT  --  124*  --   --   LABPROT  --  16.5*  --  16.0*  INR  --  1.32  --  1.27  HEPARINUNFRC 0.47 0.48  --  0.52  CREATININE 1.22*  --  1.29*  --     Estimated Creatinine Clearance: 31.2 mL/min (by C-G formula based on Cr of 1.29).   Medications:  Infusions:  . heparin 900 Units/hr (06/19/15 1729)    Assessment: 85 YOF on warfarin PTA for hx Afib/DVT - held and reversed on admit for IR procedure LLE venogram that showed patent vessels done on 2/20. The patient was bridged with heparin up to the procedure and heparin + warfarin has now been restarted post-op.   Heparin level this morning remains therapeutic (HL 0.52 << 0.48, goal of 0.3-0.7). INR remains SUBtherapeutic despite higher than PTA doses (INR 1.27 << 1.32, goal of 2-3). CBC stable - no overt s/sx of bleeding noted.   Per discussion with the MD (Dr. Sharl Ma) - will go ahead and transition the patient from heparin to lovenox for bridging to help facilitate any discharge planning. Given the patient's variable SCr (1.2-1.4) - will utilize once a day dosing.   PTA warfarin regimen: 4.5 mg daily except 3 mg on Tuesdays/Fridays per clinic note  The patient was re-educated on warfarin  today  Goal of Therapy:  INR 2-3 Monitor platelets by anticoagulation protocol: Yes   Plan:  1. Discontinue heparin  2. Start Lovenox 70 mg SQ once daily starting at 1200 today 3. Repeat warfarin 6 mg x 1 dose at 1800 today 4. Will continue to monitor for any signs/symptoms of bleeding and will follow up with INR in the a.m.   Georgina Pillion, PharmD, BCPS Clinical Pharmacist Pager: [phone] 06/20/2015 9:16 AM

## 2015-06-20 NOTE — Progress Notes (Signed)
Pt prepared for d/c to ALF. IV d/c'd. Skin intact except as charted in most recent assessments. Vitals are stable. Report called to receiving facility. Pt to be transported by ambulance service. 

## 2015-06-20 NOTE — Discharge Summary (Signed)
Physician Discharge Summary  Kristina Diaz TKK:[id number] DOB: Sep 06, 1929 DOA: 06/10/2015  PCP: Nyoka Cowden, MD  Admit date: 06/10/2015 Discharge date: 06/20/2015  Time spent: 30 minutes  Recommendations for Outpatient Follow-up:  1. Patient is on Lovenox bridging therapy along with Coumadin. 2. Discontinue Lovenox after INR greater than 2 3. Follow PCP in 2 weeks   Discharge Diagnoses:  Active Problems:   PAD (peripheral artery disease) (HCC)   Atrial fibrillation (HCC)   Hypothyroidism   Long term (current) use of anticoagulants   Sepsis (HCC)   Leg ulcer, left (HCC)   Chronic systolic CHF (congestive heart failure) (HCC)   Cellulitis of left lower extremity   Critical lower limb ischemia   Hypotension   AKI (acute kidney injury) ([facility])   Protein-calorie malnutrition, severe   Discharge Condition: Stable  Diet recommendation: Low-salt diet  Filed Weights   06/18/15 0517 06/19/15 0621 06/20/15 0523  Weight: 68.1 kg (150 lb 2.1 oz) 67.5 kg (148 lb 13 oz) 67.7 kg (149 lb 4 oz)    History of present illness:  82 y.o. year-old female with history of atrial fibrillation, PVD, heart failure, LLE DVT, chronic left leg wound who presents with fever and weakness. The patient has a history of nonhealing ulcers of the left lower extremity and underwent left popliteal and tibial revascularization with PTA of the popliteal, anterior tibial, and peroneal arteries by Dr. Earleen Newport on 05/09/2015. He used ultrasound-guided antegrade left SFA access an ultrasound-guided DP access of the foot. At the end of the case, the patient had a palpable pulse according to notes. Postprocedure, she had a fall and was found to have a hematoma at the left SFA access which resulted in an drop in her hemoglobin. She was initially on Plavix, however, she was changed to full dose aspirin which she was supposed to take for 30 days. She was also restarted on her Coumadin.    Hospital Course:  Severe  sepsis (fever to 101F at facility, tachycardia), possibly due to left lower extremity infected ulcer with surrounding cellulitis.Known peripheral arterial disease and venous disease. Hemodynamically stable- Blood cultures NGTD - ESR 77 and CRP 16.1, - - UA neg - Continued vancomycin and zosyn since 2/13. Will complete course after one more day of tx for total of 10 days of antibiotics - We will discontinue antibiotics today. - Continue local wound care for infected ulcer of left lower extremity  Peripheral arterial disease s/p recent angioplasty of LLE 04/2015 by Dr. Earleen Newport - Continue aspirin - Heparin gtt now - Pt underwent venogram 2/20 with patent vessels - Will resume coumadin per pharmacy dosing   Chronic atrial fibrillation, -now on heparin drip, in chronic afib, -Currently rate controlled on beta blocker -coumadin resumed on 2/21 post-venogram - Patient will be discharged on Lovenox for bridging therapy along with Coumadin. Discontinue Lovenox after INR greater than 2.0.  NSVT: patient asymptomatic, continue low dose betablocker for now.  Chronic diastolic and systolic heart failure, echocardiogram this admission shows ejection fraction of 35%. Has range 30-40% in the past. resumed torsemide but once a day rather than twice daily. Weights are stable. 2/18: bp soft, d/c'd lisinopril, held torsemide. Will continue with Lovenox 20 mg Monday Wednesday Friday  H/o left lower extremity DVT s/p thrombectomy in 2014, repeat venous US during this admission on 2/14 neg for acute DVT,  patient has been on chronic anticoagulation  AKI, creatinine 1.4 on admission. 1.29 currently  Kyphosis, severe compression fracture of L4. stable  Hypothyroidism, stable,  continue sythroid   Procedures:  None   Consultations:  None   Discharge Exam: Filed Vitals:   06/20/15 0523 06/20/15 0906  BP: 122/69 104/50  Pulse: 98 81  Temp: 98.2 F (36.8 C)   Resp: 15     General:  Appears in no acute distress Cardiovascular: S1-S2 regular Respiratory: Clear to auscultation bilaterally  Discharge Instructions   Discharge Instructions    Diet - low sodium heart healthy    Complete by:  As directed      Discharge instructions    Complete by:  As directed   Wound care--Continue present plan of care as previously ordered with Santyl for chemical debridement, and ace wrap for light compression. Discussed plan of care and pt verbalized understanding. Pt can resume follow-up with the outpatient wound care center after discharge.     Increase activity slowly    Complete by:  As directed           Current Discharge Medication List    START taking these medications   Details  enoxaparin (LOVENOX) 80 MG/0.8ML injection Inject 0.7 mLs (70 mg total) into the skin daily. Qty: 22.4 Syringe, Refills: 0    feeding supplement, ENSURE ENLIVE, (ENSURE ENLIVE) LIQD Take 237 mLs by mouth 2 (two) times daily between meals. Qty: 237 mL, Refills: 12      CONTINUE these medications which have CHANGED   Details  oxyCODONE (OXY IR/ROXICODONE) 5 MG immediate release tablet Take 1 tablet (5 mg total) by mouth every 6 (six) hours as needed for moderate pain or severe pain. Qty: 30 tablet, Refills: 0    oxyCODONE (OXYCONTIN) 10 mg 12 hr tablet Take 1 tablet (10 mg total) by mouth every 12 (twelve) hours. Qty: 30 tablet, Refills: 0      CONTINUE these medications which have NOT CHANGED   Details  Amino Acids-Protein Hydrolys (FEEDING SUPPLEMENT, PRO-STAT SUGAR FREE 64,) LIQD Take 30 mLs by mouth 2 (two) times daily.    aspirin EC 81 MG EC tablet Take 1 tablet (81 mg total) by mouth daily. Qty: 30 tablet, Refills: 0    docusate sodium (COLACE) 100 MG capsule Take 100 mg by mouth daily.    levothyroxine (SYNTHROID, LEVOTHROID) 25 MCG tablet TAKE 1 AND 1/2 TABS (37.5MG) BY MOUTH EVERY DAY*CHECK PULSE WEEKLY* Qty: 45 tablet, Refills: 11    metoprolol succinate (TOPROL-XL) 25 MG  24 hr tablet Take 0.5 tablets (12.5 mg total) by mouth daily. Qty: 30 tablet, Refills: 11    Multiple Vitamin (MULTIVITAMIN WITH MINERALS) TABS Take 1 tablet by mouth every morning.    torsemide (DEMADEX) 20 MG tablet Take 20 mg by mouth every Monday, Wednesday, and Friday. Reported on 06/10/2015    warfarin (COUMADIN) 3 MG tablet Take 4.49m (one and a half pills) everyday except Tuesday and Friday. Take 34m(one pill) on Tuesday and Friday. Qty: 60 tablet, Refills: 0      STOP taking these medications     potassium chloride (K-DUR,KLOR-CON) 10 MEQ tablet      UNABLE TO FIND        Allergies  Allergen Reactions  . Bactrim Ds [Sulfamethoxazole-Trimethoprim] Rash  . Contrast Media [Iodinated Diagnostic Agents] Hives    BLE erythema   . Ciprofloxacin Rash  . Tape Rash      The results of significant diagnostics from this hospitalization (including imaging, microbiology, ancillary and laboratory) are listed below for reference.    Significant Diagnostic Studies: Dg Chest 2 View  06/10/2015  CLINICAL DATA:  Fever, atrial fibrillation EXAM: CHEST  2 VIEW COMPARISON:  05/10/2015 FINDINGS: Cardiomegaly again noted. No acute infiltrate or pleural effusion. No pulmonary edema. The left lung apex is obscured by overlying patient's chain. Osteopenia and degenerative changes thoracic spine. Again noted old fracture deformity of L2 vertebral body. IMPRESSION: No active disease. Osteopenia and degenerative changes thoracic spine. Stable chronic fracture deformity of L2 vertebral body. Electronically Signed   By: Lahoma Crocker M.D.   On: 06/10/2015 14:02   Dg Tibia/fibula Left  06/10/2015  CLINICAL DATA:  Left lower extremity nonhealing wound, peripheral vascular disease EXAM: LEFT TIBIA AND FIBULA - 2 VIEW COMPARISON:  05/09/2015 FINDINGS: Bones are osteopenic. Normal alignment. No acute osseous finding or fracture. Peripheral atherosclerosis evident. IMPRESSION: No acute osseous finding.  Osteopenia Peripheral atherosclerosis Electronically Signed   By: Jerilynn Mages.  Shick M.D.   On: 06/10/2015 15:40   Ct Lumbar Spine Wo Contrast  06/09/2015  CLINICAL DATA:  82 year old female with lower back pain evaluate for lumbar spine fractures. EXAM: CT LUMBAR SPINE WITHOUT CONTRAST TECHNIQUE: Multidetector CT imaging of the lumbar spine was performed without intravenous contrast administration. Multiplanar CT image reconstructions were also generated. COMPARISON:  CT and radiographs dated 05/10/2015 FINDINGS: There is compression fracture of the L2 vertebra with near complete loss of vertebral body height and anterior wedging similar prior study. There is approximately 4 mm retropulsion of the superior posterior cortex of the L2 with mild focal narrowing of the central canal similar to prior study. There is resulting kyphosis of the spine at L2. No new fracture identified. The bones are osteopenic. Multilevel disc desiccation with vacuum phenomenon noted. There is grade 1 L5-S1 anterolisthesis. Constipation. No definite evidence of bowel obstruction. A 1.5 cm ill-defined hypodensity in the region of the head of the pancreas. IMPRESSION: L2 compression fracture, similar prior study with near complete loss of vertebral body height and anterior wedging. No new fracture identified. Electronically Signed   By: Anner Crete M.D.   On: 06/09/2015 00:43   Ir Veno/ext/uni Left  06/17/2015  Corrie Mckusick, DO     06/17/2015  1:15 PM Interventional Radiology Procedure Note Procedure:  US guided access of left pedal vein, placement of IV for LLE venogram. US guided access of left femoral vein, venogram of iliac system. Complications: None Findings: Venogram LLE with and without tourniquet of the ankle demonstrates patency of the major tibial veins, and patency of the popliteal vein, patent femoral vein.  Venogram iliac system shows patent iliac system.  No occlusion or stenosis.  Recommendations: - Routine wound care left  pedal vein IV site, now removed. - Routine wound care left femoral vein puncture site. - Do not submerge for 7 days. - Continue current care, including outpatient wound care with [facility] and Dr. Dellia Nims. Signed, Dulcy Fanny. Wagner, DO   US Arterial Seg Multiple  06/06/2015  CLINICAL DATA:  Peripheral arterial disease. History of chronic nonhealing wound in left lower extremity. EXAM: NONINVASIVE PHYSIOLOGIC VASCULAR STUDY OF BILATERAL LOWER EXTREMITIES TECHNIQUE: Evaluation of both lower extremities was performed at rest, including calculation of ankle-brachial indices, multiple segmental pressure evaluation, segmental Doppler and segmental pulse volume recording. COMPARISON:  Left lower extremity arteriogram 05/09/2015. ABI examination 01/24/2015. FINDINGS: Right ABI:  1.24 Left ABI:  0.59 Right Lower Extremity: Normal triphasic Doppler waveforms at the right posterior tibial artery and right dorsalis pedis artery. Left Lower Extremity: Monophasic Doppler waveforms in the left posterior tibial artery and dorsalis pedis artery. IMPRESSION: Left ABI  is 0.59 and suggest moderate to severe peripheral arterial disease. Normal right ABI, measuring 1.24. Electronically Signed   By: Markus Daft M.D.   On: 06/06/2015 16:56   Ir US Guide Vasc Access Left  06/17/2015  INDICATION: 82 year old female with a history of chronic left lower extremity wound. Distribution of the wound suggests element of venous etiology. Prior left popliteal artery occlusion has been treated, restoring flow to the tibials. She presents today for venography, possible intervention. EXAM: LEFT EXTREMITY VENOGRAPHY; IR ULTRASOUND GUIDANCE VASC ACCESS LEFT COMPARISON:  CT 04/25/2015 MEDICATIONS: Patient received pretreatment for a contrast allergy with Solu-Medrol and Benadryl IV ANESTHESIA/SEDATION: Versed 0 mg IV; Fentanyl 25 mcg IV Moderate Sedation Time:  25 minutes The patient was continuously monitored during the procedure by the  interventional radiology nurse under my direct supervision. FLUOROSCOPY TIME:  Fluoroscopy Time: 0 minutes 54 seconds (26 mGy). COMPLICATIONS: None TECHNIQUE: Informed written consent was obtained from the patient after a thorough discussion of the procedural risks, benefits and alternatives. All questions were addressed. Maximal Sterile Barrier Technique was utilized including caps, mask, sterile gowns, sterile gloves, sterile drape, hand hygiene and skin antiseptic. A timeout was performed prior to the initiation of the procedure. Ultrasound survey of the left pedal veins was performed with images stored and sent to PACs. Once the patient is prepped and draped in usual sterile fashion, the skin was infiltrated with 1% lidocaine overlying the selected pedal vein for access. A 20 gauge angiocath was then inserted into the selected pedal vein using ultrasound guidance. With confirmed blood return, 1 cc contrast was infused to confirm intravenous position. CO2 venogram of the left lower extremity was performed first with no tourniquet in place, and a second with a tourniquet in place at the ankle. Ultrasound survey of the proximal left thigh was performed with images stored and sent to PACs. A micropuncture needle was used access the proximal left femoral vein under ultrasound. With venous blood flow returned, and an .018 micro wire was passed through the needle, observed enter the iliac vein under fluoroscopy. The needle was removed, and a micropuncture sheath was placed over the wire. The inner dilator and wire were removed, and an 035 Bentson wire was advanced under fluoroscopy into the pelvis. The sheath was removed and a standard 5 [place] vascular sheath was placed. The dilator was removed and the sheath was flushed. CO2 venogram and then a contrast venogram of the left iliac system was performed, with no occlusion or stenosis identified. Both the pedal access and the femoral vein access were removed with  compression for hemostasis. Sterile bandages were placed. Patient tolerated the procedure well and remained hemodynamically stable throughout. No complications were encountered and no significant blood loss. FINDINGS: CO2 venogram of the left tibial venous system, both the superficial and the deep (without a tourniquet and with a tourniquet in place) demonstrate patency of the deep venous system, and continuous inline through the popliteal vein and femoral vein. Venogram of the left iliac system demonstrates no occlusion and no significant stenosis. No chronic occlusion was identified for treatment. IMPRESSION: Status post left lower extremity venography, confirming patency of the deep veins of the tibial system, and patency of the iliac system. The femoral vein has been documented to be patent on prior duplex ultrasound. No obstruction was found requiring treatment. My impression is that the venous contribution to the left lower extremity chronic wound is not from obstructed vein, though is exacerbated by poor foot muscle pump, calf muscle  pump, elevated central venous pressures (cardiomegaly), and dependent positioning of the wound. Signed, Dulcy Fanny. Earleen Newport, DO [facility] Radiology Electronically Signed   By: Corrie Mckusick D.O.   On: 06/17/2015 13:43   Ir US Guide Vasc Access Left  06/17/2015  INDICATION: 82 year old female with a history of chronic left lower extremity wound. Distribution of the wound suggests element of venous etiology. Prior left popliteal artery occlusion has been treated, restoring flow to the tibials. She presents today for venography, possible intervention. EXAM: LEFT EXTREMITY VENOGRAPHY; IR ULTRASOUND GUIDANCE VASC ACCESS LEFT COMPARISON:  CT 04/25/2015 MEDICATIONS: Patient received pretreatment for a contrast allergy with Solu-Medrol and Benadryl IV ANESTHESIA/SEDATION: Versed 0 mg IV; Fentanyl 25 mcg IV Moderate Sedation Time:  25  minutes The patient was continuously monitored during the procedure by the interventional radiology nurse under my direct supervision. FLUOROSCOPY TIME:  Fluoroscopy Time: 0 minutes 54 seconds (26 mGy). COMPLICATIONS: None TECHNIQUE: Informed written consent was obtained from the patient after a thorough discussion of the procedural risks, benefits and alternatives. All questions were addressed. Maximal Sterile Barrier Technique was utilized including caps, mask, sterile gowns, sterile gloves, sterile drape, hand hygiene and skin antiseptic. A timeout was performed prior to the initiation of the procedure. Ultrasound survey of the left pedal veins was performed with images stored and sent to PACs. Once the patient is prepped and draped in usual sterile fashion, the skin was infiltrated with 1% lidocaine overlying the selected pedal vein for access. A 20 gauge angiocath was then inserted into the selected pedal vein using ultrasound guidance. With confirmed blood return, 1 cc contrast was infused to confirm intravenous position. CO2 venogram of the left lower extremity was performed first with no tourniquet in place, and a second with a tourniquet in place at the ankle. Ultrasound survey of the proximal left thigh was performed with images stored and sent to PACs. A micropuncture needle was used access the proximal left femoral vein under ultrasound. With venous blood flow returned, and an .018 micro wire was passed through the needle, observed enter the iliac vein under fluoroscopy. The needle was removed, and a micropuncture sheath was placed over the wire. The inner dilator and wire were removed, and an 035 Bentson wire was advanced under fluoroscopy into the pelvis. The sheath was removed and a standard 5 [place] vascular sheath was placed. The dilator was removed and the sheath was flushed. CO2 venogram and then a contrast venogram of the left iliac system was performed, with no occlusion or stenosis identified.  Both the pedal access and the femoral vein access were removed with compression for hemostasis. Sterile bandages were placed. Patient tolerated the procedure well and remained hemodynamically stable throughout. No complications were encountered and no significant blood loss. FINDINGS: CO2 venogram of the left tibial venous system, both the superficial and the deep (without a tourniquet and with a tourniquet in place) demonstrate patency of the deep venous system, and continuous inline through the popliteal vein and femoral vein. Venogram of the left iliac system demonstrates no occlusion and no significant stenosis. No chronic occlusion was identified for treatment. IMPRESSION: Status post left lower extremity venography, confirming patency of the deep veins of the tibial system, and patency of the iliac system. The femoral vein has been documented to be patent on prior duplex ultrasound. No obstruction was found requiring treatment. My impression is that the venous contribution to the left lower extremity chronic wound is not from obstructed vein, though  is exacerbated by poor foot muscle pump, calf muscle pump, elevated central venous pressures (cardiomegaly), and dependent positioning of the wound. Signed, Dulcy Fanny. Earleen Newport, DO [facility] Radiology Electronically Signed   By: Corrie Mckusick D.O.   On: 06/17/2015 13:43    Microbiology: Recent Results (from the past 240 hour(s))  Culture, blood (routine x 2)     Status: None   Collection Time: 06/10/15  2:20 PM  Result Value Ref Range Status   Specimen Description BLOOD RIGHT FOREARM  Final   Special Requests BOTTLES DRAWN AEROBIC AND ANAEROBIC 5 CC EA  Final   Culture   Final    NO GROWTH 5 DAYS Performed at [facility]    Report Status 06/15/2015 FINAL  Final  Culture, blood (routine x 2)     Status: None   Collection Time: 06/10/15  2:25 PM  Result Value Ref Range Status   Specimen  Description BLOOD RIGHT ANTECUBITAL  Final   Special Requests BOTTLES DRAWN AEROBIC AND ANAEROBIC 5 CC EA  Final   Culture   Final    NO GROWTH 5 DAYS Performed at [facility]    Report Status 06/15/2015 FINAL  Final  Urine culture     Status: None   Collection Time: 06/11/15  8:47 AM  Result Value Ref Range Status   Specimen Description URINE, CATHETERIZED  Final   Special Requests NONE  Final   Culture   Final    NO GROWTH 1 DAY Performed at [facility]    Report Status 06/12/2015 FINAL  Final  MRSA PCR Screening     Status: None   Collection Time: 06/11/15 10:50 PM  Result Value Ref Range Status   MRSA by PCR NEGATIVE NEGATIVE Final    Comment:        The GeneXpert MRSA Assay (FDA approved for NASAL specimens only), is one component of a comprehensive MRSA colonization surveillance program. It is not intended to diagnose MRSA infection nor to guide or monitor treatment for MRSA infections.      Labs: Basic Metabolic Panel:  Recent Labs Lab 06/14/15 0833 06/15/15 0536 06/16/15 0524 06/17/15 0637 06/18/15 0643 06/19/15 1027 06/20/15 0535  NA 146* 144 144 143 140 140  --   K 3.6 3.9 3.7 3.8 4.1 4.0  --   CL 112* 110 109 106 103 105  --   CO2 25 26 26 26 26 28   --   GLUCOSE 84 90 87 81 138* 98  --   BUN 33* 34* 29* 27* 26* 24*  --   CREATININE 1.28* 1.43* 1.32* 1.40* 1.22* 1.29*  --   CALCIUM 8.4* 8.5* 8.5* 8.9 9.2 9.0  --   MG 2.0  --  2.1  --   --   --  2.2   CBC:  Recent Labs Lab 06/16/15 0524 06/17/15 0637 06/18/15 0643 06/19/15 0639 06/20/15 0535  WBC 7.5 7.8 10.1 10.4 7.6  HGB 9.4* 9.9* 10.0* 9.9* 10.0*  HCT 31.0* 31.3* 33.7* 32.0* 33.4*  MCV 94.5 96.3 95.2 97.9 96.3  PLT 178 170 170 172 172    Signed:  LAMA,GAGAN S MD.  [facility] 06/20/2015, 12:27 PM

## 2015-06-20 NOTE — Progress Notes (Signed)
Spoke with patient about Lovenox. She stated she remembered talking to pharmacist about shot. States she doesn't want to do it but understands why it is important and "will." Pharmacist suspects she may need it for 3-4 days no more than unlikely more than 7. Cost is $17 per injection. Patient will have instruction by bedside RN, spoke with Adrianne, prior to discharge. Patient will also receive [facility] RN that can assist with shot on days that she comes.

## 2015-06-20 NOTE — Discharge Instructions (Signed)

## 2015-06-21 ENCOUNTER — Telehealth: Payer: Self-pay | Admitting: Internal Medicine

## 2015-06-21 NOTE — Telephone Encounter (Signed)
Called Kristina Diaz, verbal order given to administer Lovenox injection daily, okay per Dr.K. Kristina Diaz verbalized understanding.

## 2015-06-21 NOTE — Telephone Encounter (Signed)
Pt dc'd yesterday from hospital with orders for PT,OT, nursing and social worker.  Also pt needs nurse to administer Lovenox injection daily. Until I & R is therapeutic (2.0 or greater)  Marylene Land w/ [facility] needs orders to administer injection daily. Verbal today please

## 2015-06-22 ENCOUNTER — Telehealth: Payer: Self-pay | Admitting: Family Medicine

## 2015-06-22 NOTE — Telephone Encounter (Signed)
Received call from [facility] [place]. Patient has been there since 2/23 after a hospital stay for severe sepsis. She had 3 episodes of diarrhea yesterday and one this am. No fevers or severe pain. Due to recent antibiotic use, will proceed with stool for CDiff and bland diet with clear liquids over weekend. Hold Colace and call office on Monday to discuss restarting colace. Call back with any change in clinical condition such as fever, mental status change, etc

## 2015-06-25 ENCOUNTER — Telehealth: Payer: Self-pay | Admitting: Internal Medicine

## 2015-06-25 ENCOUNTER — Ambulatory Visit (INDEPENDENT_AMBULATORY_CARE_PROVIDER_SITE_OTHER): Payer: Medicare Other | Admitting: General Practice

## 2015-06-25 ENCOUNTER — Telehealth: Payer: Self-pay | Admitting: *Deleted

## 2015-06-25 DIAGNOSIS — Z5181 Encounter for therapeutic drug level monitoring: Secondary | ICD-10-CM

## 2015-06-25 DIAGNOSIS — I4891 Unspecified atrial fibrillation: Secondary | ICD-10-CM

## 2015-06-25 LAB — POCT INR: INR: 2.83

## 2015-06-25 NOTE — Progress Notes (Signed)
Pre visit review using our clinic review tool, if applicable. No additional management support is needed unless otherwise documented below in the visit note. 

## 2015-06-25 NOTE — Progress Notes (Signed)
I have reviewed and agree with the plan. 

## 2015-06-25 NOTE — Telephone Encounter (Signed)
Please see message and advise 

## 2015-06-25 NOTE — Telephone Encounter (Signed)
Spoke to [place], verbal order given for Physical Therapy 2 x a week for 4 weeks for strengthening and mobility okay per Dr.K. Marcelino Duster verbalized understanding. Also I will fax order to [facility] for wheelchair as requested. Marcelino Duster verbalized understanding. Order signed and faxed.

## 2015-06-25 NOTE — Telephone Encounter (Signed)
Suggest ED evaluation.  Due to fever and tachycardia Patient likely will need chest x-ray and CBC, and other lab which cannot be done in the office setting

## 2015-06-25 NOTE — Telephone Encounter (Signed)
Spoke to [place], told her Dr.K said suggest ED evaluation. Due to fever and tachycardia Patient likely will need chest x-ray and CBC, and other lab which cannot be done in the office setting. Marylene Land verbalized understanding.

## 2015-06-25 NOTE — Telephone Encounter (Signed)
Kristina Diaz has resume pt under home health therapy for twice a wk for 4 wks for strengthening and mobility. Verbal order is ok. Kristina Diaz needs [facility] wheel chair with a reclining back and elevate leg rest and gel cushion.

## 2015-06-25 NOTE — Telephone Encounter (Signed)
Angela left message on my voicemail pt is refusing to go to ED wants antibiotics called in. Discussed with Dr.K and he said will not call in antibiotics without pt being seen. Pt can come in tomorrow if not go to ED.  Called Marylene Land back told her Dr.K said pt can be seen here tomorrow in the office if she can get her. He will not call antibiotics in without being evaluated. Told Marylene Land if pt can not come tomorrow will need to go to ED. Marylene Land verbalized understanding and will let pt know.

## 2015-06-25 NOTE — Telephone Encounter (Signed)
Marylene Land rn from [facility] is calling pt is running a temp 100.1 and heart rate 125. Pt said she feels fine. Pt does not want to go back to hospital. Please advise

## 2015-06-26 ENCOUNTER — Encounter (HOSPITAL_COMMUNITY): Payer: Self-pay | Admitting: Emergency Medicine

## 2015-06-26 ENCOUNTER — Emergency Department (HOSPITAL_COMMUNITY): Payer: Medicare Other

## 2015-06-26 ENCOUNTER — Inpatient Hospital Stay (HOSPITAL_COMMUNITY): Payer: Medicare Other

## 2015-06-26 ENCOUNTER — Inpatient Hospital Stay (HOSPITAL_COMMUNITY)
Admission: EM | Admit: 2015-06-26 | Discharge: 2015-07-07 | DRG: 871 | Disposition: A | Discharge status: Home Health | Payer: Medicare Other | Attending: Internal Medicine | Admitting: Internal Medicine

## 2015-06-26 ENCOUNTER — Telehealth: Payer: Self-pay | Admitting: Internal Medicine

## 2015-06-26 DIAGNOSIS — Z86718 Personal history of other venous thrombosis and embolism: Secondary | ICD-10-CM

## 2015-06-26 DIAGNOSIS — I5022 Chronic systolic (congestive) heart failure: Secondary | ICD-10-CM | POA: Diagnosis present

## 2015-06-26 DIAGNOSIS — N170 Acute kidney failure with tubular necrosis: Secondary | ICD-10-CM | POA: Diagnosis not present

## 2015-06-26 DIAGNOSIS — M869 Osteomyelitis, unspecified: Secondary | ICD-10-CM

## 2015-06-26 DIAGNOSIS — Z7901 Long term (current) use of anticoagulants: Secondary | ICD-10-CM

## 2015-06-26 DIAGNOSIS — R6521 Severe sepsis with septic shock: Secondary | ICD-10-CM | POA: Diagnosis present

## 2015-06-26 DIAGNOSIS — I739 Peripheral vascular disease, unspecified: Secondary | ICD-10-CM | POA: Diagnosis not present

## 2015-06-26 DIAGNOSIS — M810 Age-related osteoporosis without current pathological fracture: Secondary | ICD-10-CM | POA: Diagnosis present

## 2015-06-26 DIAGNOSIS — Z66 Do not resuscitate: Secondary | ICD-10-CM | POA: Diagnosis present

## 2015-06-26 DIAGNOSIS — N179 Acute kidney failure, unspecified: Secondary | ICD-10-CM | POA: Diagnosis present

## 2015-06-26 DIAGNOSIS — L089 Local infection of the skin and subcutaneous tissue, unspecified: Secondary | ICD-10-CM

## 2015-06-26 DIAGNOSIS — L97929 Non-pressure chronic ulcer of unspecified part of left lower leg with unspecified severity: Secondary | ICD-10-CM | POA: Diagnosis present

## 2015-06-26 DIAGNOSIS — E43 Unspecified severe protein-calorie malnutrition: Secondary | ICD-10-CM | POA: Diagnosis present

## 2015-06-26 DIAGNOSIS — R531 Weakness: Secondary | ICD-10-CM | POA: Insufficient documentation

## 2015-06-26 DIAGNOSIS — I480 Paroxysmal atrial fibrillation: Secondary | ICD-10-CM | POA: Diagnosis not present

## 2015-06-26 DIAGNOSIS — I4891 Unspecified atrial fibrillation: Secondary | ICD-10-CM | POA: Diagnosis not present

## 2015-06-26 DIAGNOSIS — T148XXA Other injury of unspecified body region, initial encounter: Secondary | ICD-10-CM

## 2015-06-26 DIAGNOSIS — I420 Dilated cardiomyopathy: Secondary | ICD-10-CM | POA: Diagnosis present

## 2015-06-26 DIAGNOSIS — L03116 Cellulitis of left lower limb: Secondary | ICD-10-CM | POA: Diagnosis present

## 2015-06-26 DIAGNOSIS — I130 Hypertensive heart and chronic kidney disease with heart failure and stage 1 through stage 4 chronic kidney disease, or unspecified chronic kidney disease: Secondary | ICD-10-CM | POA: Diagnosis present

## 2015-06-26 DIAGNOSIS — I70249 Atherosclerosis of native arteries of left leg with ulceration of unspecified site: Secondary | ICD-10-CM | POA: Diagnosis present

## 2015-06-26 DIAGNOSIS — E039 Hypothyroidism, unspecified: Secondary | ICD-10-CM | POA: Diagnosis present

## 2015-06-26 DIAGNOSIS — R509 Fever, unspecified: Secondary | ICD-10-CM | POA: Diagnosis present

## 2015-06-26 DIAGNOSIS — I444 Left anterior fascicular block: Secondary | ICD-10-CM | POA: Diagnosis present

## 2015-06-26 DIAGNOSIS — I482 Chronic atrial fibrillation, unspecified: Secondary | ICD-10-CM

## 2015-06-26 DIAGNOSIS — E038 Other specified hypothyroidism: Secondary | ICD-10-CM | POA: Diagnosis not present

## 2015-06-26 DIAGNOSIS — Z7982 Long term (current) use of aspirin: Secondary | ICD-10-CM

## 2015-06-26 DIAGNOSIS — E876 Hypokalemia: Secondary | ICD-10-CM | POA: Diagnosis present

## 2015-06-26 DIAGNOSIS — I481 Persistent atrial fibrillation: Secondary | ICD-10-CM | POA: Diagnosis not present

## 2015-06-26 DIAGNOSIS — S81802A Unspecified open wound, left lower leg, initial encounter: Secondary | ICD-10-CM

## 2015-06-26 DIAGNOSIS — R791 Abnormal coagulation profile: Secondary | ICD-10-CM

## 2015-06-26 DIAGNOSIS — S81809A Unspecified open wound, unspecified lower leg, initial encounter: Secondary | ICD-10-CM | POA: Diagnosis present

## 2015-06-26 DIAGNOSIS — A419 Sepsis, unspecified organism: Secondary | ICD-10-CM

## 2015-06-26 DIAGNOSIS — N183 Chronic kidney disease, stage 3 (moderate): Secondary | ICD-10-CM | POA: Diagnosis present

## 2015-06-26 DIAGNOSIS — T148 Other injury of unspecified body region: Secondary | ICD-10-CM | POA: Diagnosis not present

## 2015-06-26 DIAGNOSIS — Z515 Encounter for palliative care: Secondary | ICD-10-CM | POA: Insufficient documentation

## 2015-06-26 LAB — COMPREHENSIVE METABOLIC PANEL
ALBUMIN: 2.3 g/dL — AB (ref 3.5–5.0)
ALK PHOS: 480 U/L — AB (ref 38–126)
ALT: 43 U/L (ref 14–54)
ANION GAP: 13 (ref 5–15)
AST: 72 U/L — ABNORMAL HIGH (ref 15–41)
BILIRUBIN TOTAL: 1 mg/dL (ref 0.3–1.2)
BUN: 47 mg/dL — ABNORMAL HIGH (ref 6–20)
CALCIUM: 8.4 mg/dL — AB (ref 8.9–10.3)
CO2: 25 mmol/L (ref 22–32)
Chloride: 98 mmol/L — ABNORMAL LOW (ref 101–111)
Creatinine, Ser: 2.5 mg/dL — ABNORMAL HIGH (ref 0.44–1.00)
GFR calc Af Amer: 19 mL/min — ABNORMAL LOW (ref >60)
GFR, EST NON AFRICAN AMERICAN: 17 mL/min — AB (ref >60)
GLUCOSE: 109 mg/dL — AB (ref 65–99)
Potassium: 3.7 mmol/L (ref 3.5–5.1)
Sodium: 136 mmol/L (ref 135–145)
TOTAL PROTEIN: 6.5 g/dL (ref 6.5–8.1)

## 2015-06-26 LAB — SEDIMENTATION RATE: SED RATE: 32 mm/h — AB (ref 0–22)

## 2015-06-26 LAB — CBC WITH DIFFERENTIAL/PLATELET
BASOS ABS: 0 10*3/uL (ref 0.0–0.1)
Basophils Relative: 0 %
EOS ABS: 0 10*3/uL (ref 0.0–0.7)
Eosinophils Relative: 0 %
HCT: 28.7 % — ABNORMAL LOW (ref 36.0–46.0)
Hemoglobin: 9 g/dL — ABNORMAL LOW (ref 12.0–15.0)
LYMPHS ABS: 1 10*3/uL (ref 0.7–4.0)
LYMPHS PCT: 5 %
MCH: 29.1 pg (ref 26.0–34.0)
MCHC: 31.4 g/dL (ref 30.0–36.0)
MCV: 92.9 fL (ref 78.0–100.0)
Monocytes Absolute: 1.4 10*3/uL — ABNORMAL HIGH (ref 0.1–1.0)
Monocytes Relative: 7 %
NEUTROS ABS: 17.5 10*3/uL — AB (ref 1.7–7.7)
Neutrophils Relative %: 88 %
Platelets: 149 10*3/uL — ABNORMAL LOW (ref 150–400)
RBC: 3.09 MIL/uL — ABNORMAL LOW (ref 3.87–5.11)
RDW: 20.8 % — AB (ref 11.5–15.5)
WBC Morphology: INCREASED
WBC: 19.9 10*3/uL — AB (ref 4.0–10.5)

## 2015-06-26 LAB — URINALYSIS, ROUTINE W REFLEX MICROSCOPIC
BILIRUBIN URINE: NEGATIVE
Glucose, UA: NEGATIVE mg/dL
HGB URINE DIPSTICK: NEGATIVE
KETONES UR: NEGATIVE mg/dL
Nitrite: NEGATIVE
PROTEIN: NEGATIVE mg/dL
Specific Gravity, Urine: 1.014 (ref 1.005–1.030)
pH: 5 (ref 5.0–8.0)

## 2015-06-26 LAB — URINE MICROSCOPIC-ADD ON: RBC / HPF: NONE SEEN RBC/hpf (ref 0–5)

## 2015-06-26 LAB — BASIC METABOLIC PANEL
Anion gap: 12 (ref 5–15)
BUN: 42 mg/dL — AB (ref 6–20)
CALCIUM: 7.7 mg/dL — AB (ref 8.9–10.3)
CHLORIDE: 105 mmol/L (ref 101–111)
CO2: 23 mmol/L (ref 22–32)
CREATININE: 2.05 mg/dL — AB (ref 0.44–1.00)
GFR calc Af Amer: 25 mL/min — ABNORMAL LOW (ref >60)
GFR calc non Af Amer: 21 mL/min — ABNORMAL LOW (ref >60)
Glucose, Bld: 109 mg/dL — ABNORMAL HIGH (ref 65–99)
POTASSIUM: 3.4 mmol/L — AB (ref 3.5–5.1)
Sodium: 140 mmol/L (ref 135–145)

## 2015-06-26 LAB — APTT: aPTT: 57 seconds — ABNORMAL HIGH (ref 24–37)

## 2015-06-26 LAB — PROCALCITONIN: Procalcitonin: 3.3 ng/mL

## 2015-06-26 LAB — TYPE AND SCREEN
ABO/RH(D): B NEG
Antibody Screen: NEGATIVE

## 2015-06-26 LAB — I-STAT CG4 LACTIC ACID, ED
LACTIC ACID, VENOUS: 1.55 mmol/L (ref 0.5–2.0)
Lactic Acid, Venous: 2.64 mmol/L (ref 0.5–2.0)

## 2015-06-26 LAB — PROTIME-INR
INR: 5.31 (ref 0.00–1.49)
PROTHROMBIN TIME: 46.6 s — AB (ref 11.6–15.2)

## 2015-06-26 LAB — CORTISOL: Cortisol, Plasma: >100 ug/dL

## 2015-06-26 LAB — C-REACTIVE PROTEIN: CRP: 17.3 mg/dL — ABNORMAL HIGH (ref <1.0)

## 2015-06-26 LAB — LACTIC ACID, PLASMA
LACTIC ACID, VENOUS: 1.3 mmol/L (ref 0.5–2.0)
LACTIC ACID, VENOUS: 1 mmol/L (ref 0.5–2.0)

## 2015-06-26 MED ORDER — SODIUM CHLORIDE 0.9 % IV SOLN
INTRAVENOUS | Status: DC
  Administered 2015-06-26: 18:00:00 via INTRAVENOUS

## 2015-06-26 MED ORDER — VANCOMYCIN HCL IN DEXTROSE 1-5 GM/200ML-% IV SOLN
1000.0000 mg | Freq: Once | INTRAVENOUS | Status: AC
  Administered 2015-06-26: 1000 mg via INTRAVENOUS
  Filled 2015-06-26: qty 200

## 2015-06-26 MED ORDER — SODIUM CHLORIDE 0.9 % IV BOLUS (SEPSIS)
500.0000 mL | Freq: Once | INTRAVENOUS | Status: AC
  Administered 2015-06-26: 500 mL via INTRAVENOUS

## 2015-06-26 MED ORDER — SODIUM CHLORIDE 0.9 % IV BOLUS (SEPSIS): 1000.0000 mL | Freq: Once | INTRAVENOUS | Status: DC

## 2015-06-26 MED ORDER — SODIUM CHLORIDE 0.9 % IV BOLUS (SEPSIS): 500.0000 mL | INTRAVENOUS | Status: DC

## 2015-06-26 MED ORDER — SODIUM CHLORIDE 0.9 % IV BOLUS (SEPSIS)
1000.0000 mL | INTRAVENOUS | Status: AC
  Administered 2015-06-26 (×2): 1000 mL via INTRAVENOUS

## 2015-06-26 MED ORDER — OXYCODONE HCL 5 MG PO TABS
5.0000 mg | ORAL_TABLET | Freq: Four times a day (QID) | ORAL | Status: DC | PRN
  Administered 2015-06-26 – 2015-06-28 (×6): 5 mg via ORAL
  Filled 2015-06-26 (×7): qty 1

## 2015-06-26 MED ORDER — ONDANSETRON HCL 4 MG PO TABS: 4.0000 mg | ORAL_TABLET | Freq: Four times a day (QID) | ORAL | Status: DC | PRN

## 2015-06-26 MED ORDER — ACETAMINOPHEN 325 MG PO TABS
650.0000 mg | ORAL_TABLET | Freq: Four times a day (QID) | ORAL | Status: DC | PRN
  Administered 2015-06-28: 650 mg via ORAL
  Filled 2015-06-26: qty 2

## 2015-06-26 MED ORDER — HYDROCORTISONE NA SUCCINATE PF 100 MG IJ SOLR
100.0000 mg | Freq: Three times a day (TID) | INTRAMUSCULAR | Status: DC
  Administered 2015-06-26 – 2015-06-27 (×2): 100 mg via INTRAVENOUS
  Filled 2015-06-26 (×2): qty 2

## 2015-06-26 MED ORDER — VANCOMYCIN HCL IN DEXTROSE 750-5 MG/150ML-% IV SOLN: 750.0000 mg | INTRAVENOUS | Status: DC

## 2015-06-26 MED ORDER — ACETAMINOPHEN 650 MG RE SUPP: 650.0000 mg | Freq: Four times a day (QID) | RECTAL | Status: DC | PRN

## 2015-06-26 MED ORDER — ENSURE ENLIVE PO LIQD
237.0000 mL | Freq: Two times a day (BID) | ORAL | Status: DC
  Administered 2015-06-27 – 2015-07-05 (×17): 237 mL via ORAL

## 2015-06-26 MED ORDER — PIPERACILLIN-TAZOBACTAM IN DEX 2-0.25 GM/50ML IV SOLN
2.2500 g | Freq: Three times a day (TID) | INTRAVENOUS | Status: DC
  Administered 2015-06-26 – 2015-06-27 (×2): 2.25 g via INTRAVENOUS
  Filled 2015-06-26 (×4): qty 50

## 2015-06-26 MED ORDER — ONDANSETRON HCL 4 MG/2ML IJ SOLN: 4.0000 mg | Freq: Four times a day (QID) | INTRAMUSCULAR | Status: DC | PRN

## 2015-06-26 MED ORDER — SODIUM CHLORIDE 0.9 % IV BOLUS (SEPSIS)
1000.0000 mL | Freq: Once | INTRAVENOUS | Status: AC
  Administered 2015-06-26: 1000 mL via INTRAVENOUS

## 2015-06-26 MED ORDER — ASPIRIN EC 81 MG PO TBEC
81.0000 mg | DELAYED_RELEASE_TABLET | Freq: Every day | ORAL | Status: DC
  Administered 2015-06-27 – 2015-07-07 (×11): 81 mg via ORAL
  Filled 2015-06-26 (×11): qty 1

## 2015-06-26 MED ORDER — SODIUM CHLORIDE 0.9 % IV SOLN
INTRAVENOUS | Status: DC
  Administered 2015-06-27: 12:00:00 via INTRAVENOUS

## 2015-06-26 MED ORDER — PRO-STAT SUGAR FREE PO LIQD
30.0000 mL | Freq: Two times a day (BID) | ORAL | Status: DC
  Administered 2015-07-01 – 2015-07-06 (×5): 30 mL via ORAL
  Filled 2015-06-26 (×14): qty 30

## 2015-06-26 MED ORDER — LEVOTHYROXINE SODIUM 75 MCG PO TABS
37.5000 ug | ORAL_TABLET | Freq: Every day | ORAL | Status: DC
  Administered 2015-06-27 – 2015-07-03 (×7): 37.5 ug via ORAL
  Filled 2015-06-26 (×7): qty 1

## 2015-06-26 MED ORDER — ADULT MULTIVITAMIN W/MINERALS CH
1.0000 | ORAL_TABLET | Freq: Every morning | ORAL | Status: DC
  Administered 2015-06-27 – 2015-07-07 (×11): 1 via ORAL
  Filled 2015-06-26 (×11): qty 1

## 2015-06-26 MED ORDER — SODIUM CHLORIDE 0.9 % IV SOLN: 500.0000 mL | INTRAVENOUS | Status: DC | PRN

## 2015-06-26 MED ORDER — PIPERACILLIN-TAZOBACTAM 3.375 G IVPB 30 MIN
3.3750 g | Freq: Once | INTRAVENOUS | Status: AC
  Administered 2015-06-26: 3.375 g via INTRAVENOUS
  Filled 2015-06-26: qty 50

## 2015-06-26 NOTE — ED Notes (Signed)
Patient transported to CT 

## 2015-06-26 NOTE — ED Provider Notes (Signed)
CSN: [id number]     Arrival date & time 06/26/15  1048 History   First MD Initiated Contact with Patient 06/26/15 1050     Chief Complaint  Patient presents with  . Fever  . leg infection     HPI   Kristina Diaz is a 82 y.o. female with a PMH of atrial fibrillation, HTN, CHF, PAD, DVT who presents to the ED with fever. Per record review, patient admitted 2/13 through 2/23 for sepsis thought to be due to left lower extremity wound infection. She denies complaints at this time. She reports she has wound care for her left lower extremity daily. She denies congestion, cough, headache, dizziness, lightheadedness, chest pain, shortness of breath, abdominal pain, nausea, vomiting, diarrhea, constipation, dysuria, urgency, frequency, numbness, weakness, paresthesia.   Past Medical History  Diagnosis Date  . PAD (peripheral artery disease) (HCC) 2013    left leg. sounds like she had angio-plasty   . Atrial fibrillation (HCC) 2013    s/p [facility] that failed. ON medication  . Hypertension   . Osteoporosis   . DVT (deep venous thrombosis) (HCC) 2013    "LLE" (08/04/2012)  . Hypothyroidism   . History of blood transfusion 2013    "related to thrombectomy" (08/04/2012)  . Anemia     "slightly" (08/04/2012)  . Arthritis     "probably minor; knees, pinky" (08/04/2012)  . Fracture of L4 vertebra (HCC)     "dx'd in 2013; can't treat til legs fixed" (08/04/2012)  . Lower extremity ulceration (HCC)   . Biventricular heart failure with reduced left ventricular function [facility])    Past Surgical History  Procedure Laterality Date  . Thrombectomy Left 2013    "leg" (08/04/2012)  . G2p2    . Tonsillectomy and adenoidectomy  ~ 1938  . Dilation and curettage of uterus  1960's   Family History  Problem Relation Age of Onset  . Arthritis Mother   . Cancer Mother     breast cancer   . Cancer Brother     lung cancer  . Tuberculosis Father   . Cancer Sister     stg 4 breast cancer  . Diabetes Neg Hx   . Heart  disease Neg Hx   . COPD Neg Hx    Social History  Substance Use Topics  . Smoking status: Never Smoker   . Smokeless tobacco: Never Used  . Alcohol Use: 0.0 oz/week     Comment: 08/04/2012 "used to have 1-2 glasses of wine/wk; nothing for the past 1 yr"   OB History    No data available      Review of Systems  Constitutional: Positive for fever. Negative for chills.  HENT: Negative for congestion.   Respiratory: Negative for cough.   Cardiovascular: Positive for leg swelling. Negative for chest pain.  Gastrointestinal: Negative for nausea, vomiting, abdominal pain, diarrhea and constipation.  Genitourinary: Negative for dysuria, urgency and frequency.  Musculoskeletal: Positive for myalgias.  Skin: Positive for color change and wound.  Neurological: Negative for weakness and numbness.  All other systems reviewed and are negative.     Allergies  Bactrim ds; Contrast media; Ciprofloxacin; and Tape  Home Medications   Prior to Admission medications   Medication Sig Start Date End Date Taking? Authorizing Provider  Amino Acids-Protein Hydrolys (FEEDING SUPPLEMENT, PRO-STAT SUGAR FREE 64,) LIQD Take 30 mLs by mouth 2 (two) times daily.   Yes Historical Provider, MD  aspirin EC 81 MG EC tablet Take 1 tablet (81  mg total) by mouth daily. 05/13/15  Yes Maryann Mikhail, DO  docusate sodium (COLACE) 100 MG capsule Take 100 mg by mouth daily.   Yes Historical Provider, MD  feeding supplement, ENSURE ENLIVE, (ENSURE ENLIVE) LIQD Take 237 mLs by mouth 2 (two) times daily between meals. 06/20/15  Yes Meredeth Ide, MD  levothyroxine (SYNTHROID, LEVOTHROID) 25 MCG tablet TAKE 1 AND 1/2 TABS (37.5MG ) BY MOUTH EVERY DAY*CHECK PULSE WEEKLY* 07/14/12  Yes Jacques Navy, MD  metoprolol succinate (TOPROL-XL) 25 MG 24 hr tablet Take 0.5 tablets (12.5 mg total) by mouth daily. 11/01/12  Yes Jacques Navy, MD  Multiple Vitamin (MULTIVITAMIN WITH MINERALS) TABS Take 1 tablet by mouth every morning.    Yes Historical Provider, MD  oxyCODONE (OXY IR/ROXICODONE) 5 MG immediate release tablet Take 1 tablet (5 mg total) by mouth every 6 (six) hours as needed for moderate pain or severe pain. 06/20/15  Yes Meredeth Ide, MD  oxyCODONE (OXYCONTIN) 10 mg 12 hr tablet Take 1 tablet (10 mg total) by mouth every 12 (twelve) hours. 06/20/15  Yes Meredeth Ide, MD  torsemide (DEMADEX) 20 MG tablet Take 20 mg by mouth daily. Reported on 06/10/2015   Yes Historical Provider, MD  warfarin (COUMADIN) 3 MG tablet Take 4.5mg  (one and a half pills) everyday except Tuesday and Friday. Take 3mg  (one pill) on Tuesday and Friday. 05/14/15  Yes Maryann Mikhail, DO  warfarin (COUMADIN) 3 MG tablet Take 4.5 mg by mouth See admin instructions. Pt takes Su,M,Wed,Th,Sa   Yes Historical Provider, MD  enoxaparin (LOVENOX) 80 MG/0.8ML injection Inject 0.7 mLs (70 mg total) into the skin daily. Patient not taking: Reported on 06/26/2015 06/20/15 06/27/15  Meredeth Ide, MD    BP 72/46 mmHg  Pulse 78  Temp(Src) 98.5 F (36.9 C) (Oral)  Resp 17  Ht 5\' 4"  (1.626 m)  Wt 67.586 kg  BMI 25.56 kg/m2  SpO2 100% Physical Exam  Constitutional: She is oriented to person, place, and time. No distress.  Frail-appearing female  HENT:  Head: Normocephalic and atraumatic.  Right Ear: External ear normal.  Left Ear: External ear normal.  Nose: Nose normal.  Mouth/Throat: Uvula is midline, oropharynx is clear and moist and mucous membranes are normal.  Eyes: Conjunctivae, EOM and lids are normal. Pupils are equal, round, and reactive to light. Right eye exhibits no discharge. Left eye exhibits no discharge. No scleral icterus.  Neck: Normal range of motion. Neck supple.  Cardiovascular: Normal rate, regular rhythm, normal heart sounds, intact distal pulses and normal pulses.   Pulmonary/Chest: Effort normal and breath sounds normal. No respiratory distress. She has no wheezes. She has no rales.  Abdominal: Soft. Normal appearance and bowel  sounds are normal. She exhibits no distension and no mass. There is no tenderness. There is no rigidity, no rebound and no guarding.  Musculoskeletal: Normal range of motion. She exhibits no edema or tenderness.  Neurological: She is alert and oriented to person, place, and time. She has normal strength. No sensory deficit.  Skin: Skin is warm, dry and intact. No rash noted. She is not diaphoretic. There is erythema. No pallor.  Diffuse ulcers to left lower extremity with purulent drainage and associated erythema and edema.  Psychiatric: She has a normal mood and affect. Her speech is normal and behavior is normal.  Nursing note and vitals reviewed.   ED Course  Procedures (including critical care time)  Labs Review Labs Reviewed  COMPREHENSIVE METABOLIC PANEL - Abnormal;  Notable for the following:    Chloride 98 (*)    Glucose, Bld 109 (*)    BUN 47 (*)    Creatinine, Ser 2.50 (*)    Calcium 8.4 (*)    Albumin 2.3 (*)    AST 72 (*)    Alkaline Phosphatase 480 (*)    GFR calc non Af Amer 17 (*)    GFR calc Af Amer 19 (*)    All other components within normal limits  CBC WITH DIFFERENTIAL/PLATELET - Abnormal; Notable for the following:    WBC 19.9 (*)    RBC 3.09 (*)    Hemoglobin 9.0 (*)    HCT 28.7 (*)    RDW 20.8 (*)    Platelets 149 (*)    Neutro Abs 17.5 (*)    Monocytes Absolute 1.4 (*)    All other components within normal limits  I-STAT CG4 LACTIC ACID, ED - Abnormal; Notable for the following:    Lactic Acid, Venous 2.64 (*)    All other components within normal limits  CULTURE, BLOOD (ROUTINE X 2)  CULTURE, BLOOD (ROUTINE X 2)  URINE CULTURE  URINALYSIS, ROUTINE W REFLEX MICROSCOPIC (NOT AT [facility])  I-STAT CG4 LACTIC ACID, ED    Imaging Review Dg Chest Port 1 View  06/26/2015  CLINICAL DATA:  Red and swollen LEFT leg EXAM: PORTABLE CHEST 1 VIEW COMPARISON:  Radiograph 06/10/2015 FINDINGS: Stable enlarged cardiac silhouette. Lungs are hyperinflated. There is  chronic bronchitic markings. The patient's chin overlies the LEFT lung apex. No infiltrate or pneumothorax IMPRESSION: 1. Hyperinflated lungs with bronchitic change. 2. No acute findings. Electronically Signed   By: Genevive Bi M.D.   On: 06/26/2015 12:06   Dg Tibia/fibula Left Port  06/26/2015  CLINICAL DATA:  R50.9 (ICD-10-CM) - Fever RED AND SWOLLEN LT LEG Lower extremity ulceration (HCC) L97.909 EXAM: PORTABLE LEFT TIBIA AND FIBULA - 2 VIEW COMPARISON:  06/10/2015 FINDINGS: Increased periosteal reaction along the distal diaphysis of the LEFT fibula. Potential cortical erosion on the lateral projection is similar to comparison. IMPRESSION: New and increased periosteal reaction involving the distal LEFT femur infant is concerning for osteomyelitis. Findings could also be found in peripheral vascular disease. Electronically Signed   By: Genevive Bi M.D.   On: 06/26/2015 12:14   I have personally reviewed and evaluated these images and lab results as part of my medical decision-making.   EKG Interpretation   Date/Time:  Wednesday June 26 2015 12:56:54 EST Ventricular Rate:  85 PR Interval:    QRS Duration: 109 QT Interval:  398 QTC Calculation: 473 R Axis:   -100 Text Interpretation:  Atrial fibrillation Left anterior fascicular block  Probable lateral infarct, old Atrial fibrillation Premature ventricular  complexes no acute change from previous Confirmed by Kandis Mannan  [phone]) on 06/26/2015 1:02:00 PM      CRITICAL CARE Performed by: Mady Gemma  Total critical care time: 45 minutes  Critical care time was exclusive of separately billable procedures and treating other patients.  Critical care was necessary to treat or prevent imminent or life-threatening deterioration.  Critical care was time spent personally by me on the following activities: development of treatment plan with patient and/or surrogate as well as nursing, discussions with consultants,  evaluation of patient's response to treatment, examination of patient, obtaining history from patient or surrogate, ordering and performing treatments and interventions, ordering and review of laboratory studies, ordering and review of radiographic studies, pulse oximetry and re-evaluation of patient's condition.  MDM   Final diagnoses:  Fever  Sepsis, due to unspecified organism (HCC)  Cellulitis of left lower leg  AKI (acute kidney injury) (HCC)    82 year old female presents with fever. Was given tylenol prior to arrival. She denies complaints this time. Temperature 98.5. Heart rate 113. Blood pressure 80/49. On exam, patient has significant erythema, edema, and purulent drainage to her left lower extremity. Code sepsis called. Patient started on broad-spectrum antibiotics and fluids per weight based protocol. Labs and imaging pending.  CBC remarkable for leukocytosis of 19.9, hemoglobin 9. CMP remarkable for creatinine 2.5, which appears elevated from baseline. Lactic acid elevated at 2.64. Chest x-ray remarkable for hyperinflated lungs with bronchitic change. Imaging of left tibia and fibular remarkable for new and increased periosteal reaction involving the distal left fibula concerning for osteomyelitis.  Critical care consulted regarding admission. Asked to recheck lactic acid and BP after fluid resuscitation.  Critical care re-consulted. Spoke with Dr. Delton Coombes, who advised given the patient is DNR and lactic acid has improved, do not feel ICU admission is indicated. Hospitalist consulted. Spoke with Dr. Sunnie Nielsen, who will see the patient in the ED.  Patient discussed with and seen by Dr. Corlis Leak.   BP 72/46 mmHg  Pulse 78  Temp(Src) 98.5 F (36.9 C) (Oral)  Resp 17  Ht  (1.626 m)  Wt 67.586 kg  BMI 25.56 kg/m2  SpO2 100%    Mady Gemma, PA-C 06/26/15 1657  Courteney Randall An, MD 06/27/15 1039

## 2015-06-26 NOTE — ED Notes (Signed)
Per EMS, patient just left hospital recently with problems from a L lower leg infection.  Patient had complained of increased drainage from wound area to facility.  Patient lives at [facility] and called EMS for patient having a fever of 100.7 this morning.   EMS gave patient tylenol  PO en route.  20 gauge L AC placed by EMS.   Patient was AFIB with multi-focal PVC's for EMS with heart rate fluctuation between 100 and 140.   Patient CBG 154 by EMS.   Systolic pressure 110.

## 2015-06-26 NOTE — H&P (Signed)
[facility] History and Physical  Kristina Diaz LPF:[id number] DOB: 1930-03-11 DOA: 06/26/2015  Referring physician:  PCP: Nyoka Cowden, MD   Chief Complaint: Fever.   HPI: Kristina Diaz is a 82 y.o. female 82 y.o. year-old female with history of atrial fibrillation, PVD, heart failure Ef 45 to 50 % LLE DVT, chronic left leg wound who presents with fever.  The patient has a history of nonhealing ulcers of the left lower extremity and underwent left popliteal and tibial revascularization with PTA of the popliteal, anterior tibial, and peroneal arteries by Dr. Earleen Newport on 05/09/2015. Postprocedure, she had a fall and was found to have a hematoma at the left SFA access which resulted in an drop in her hemoglobin. She was admitted at that time and received blood transfusion. She was recently admitted 2-13 for fever and infection LE, worsening drainage from wound. She was treated with IV antibiotics and discharge to facility. She had Venogram done 2-12 with patent vessels.  She presents today 3-01 with fevers, persistent LE redness, and drainage. Evaluation in the ED; SBP in the 80, initial lactic acid initially at 2.6 , decrease to 1.5. X ray LE; new and increased periosteal reaction involving distal left Femur.  WBC at 19. Chest x ray bronchitic changes. Cr 2.5.    Review of Systems:  Negative, except as per HPI   Past Medical History  Diagnosis Date  . PAD (peripheral artery disease) ([place]) 2013    left leg. sounds like she had angio-plasty   . Atrial fibrillation ([place]) 2013    s/p [facility] that failed. ON medication  . Hypertension   . Osteoporosis   . DVT (deep venous thrombosis) ([place]) 2013    "LLE" (08/04/2012)  . Hypothyroidism   . History of blood transfusion 2013    "related to thrombectomy" (08/04/2012)  . Anemia     "slightly" (08/04/2012)  . Arthritis     "probably minor; knees, pinky" (08/04/2012)  . Fracture of L4 vertebra ([place])     "dx'd in 2013; can't treat til legs  fixed" (08/04/2012)  . Lower extremity ulceration ([place])   . Biventricular heart failure with reduced left ventricular function [facility])    Past Surgical History  Procedure Laterality Date  . Thrombectomy Left 2013    "leg" (08/04/2012)  . G2p2    . Tonsillectomy and adenoidectomy  ~ 1938  . Dilation and curettage of uterus  1960's   Social History:  reports that she has never smoked. She has never used smokeless tobacco. She reports that she drinks alcohol. She reports that she does not use illicit drugs.  Allergies  Allergen Reactions  . Bactrim Ds [Sulfamethoxazole-Trimethoprim] Rash  . Contrast Media [Iodinated Diagnostic Agents] Hives    BLE erythema   . Ciprofloxacin Rash  . Tape Rash    Family History  Problem Relation Age of Onset  . Arthritis Mother   . Cancer Mother     breast cancer   . Cancer Brother     lung cancer  . Tuberculosis Father   . Cancer Sister     stg 4 breast cancer  . Diabetes Neg Hx   . Heart disease Neg Hx   . COPD Neg Hx     Prior to Admission medications   Medication Sig Start Date End Date Taking? Authorizing Provider  Amino Acids-Protein Hydrolys (FEEDING SUPPLEMENT, PRO-STAT SUGAR FREE 64,) LIQD Take 30 mLs by mouth 2 (two) times daily.   Yes Historical Provider, MD  aspirin EC 81  MG EC tablet Take 1 tablet (81 mg total) by mouth daily. 05/13/15  Yes Maryann Mikhail, DO  docusate sodium (COLACE) 100 MG capsule Take 100 mg by mouth daily.   Yes Historical Provider, MD  feeding supplement, ENSURE ENLIVE, (ENSURE ENLIVE) LIQD Take 237 mLs by mouth 2 (two) times daily between meals. 06/20/15  Yes Oswald Hillock, MD  levothyroxine (SYNTHROID, LEVOTHROID) 25 MCG tablet TAKE 1 AND 1/2 TABS (37.5MG) BY MOUTH EVERY DAY*CHECK PULSE WEEKLY* 07/14/12  Yes Neena Rhymes, MD  metoprolol succinate (TOPROL-XL) 25 MG 24 hr tablet Take 0.5 tablets (12.5 mg total) by mouth daily. 11/01/12  Yes Neena Rhymes, MD  Multiple Vitamin (MULTIVITAMIN WITH MINERALS) TABS  Take 1 tablet by mouth every morning.   Yes Historical Provider, MD  oxyCODONE (OXY IR/ROXICODONE) 5 MG immediate release tablet Take 1 tablet (5 mg total) by mouth every 6 (six) hours as needed for moderate pain or severe pain. 06/20/15  Yes Oswald Hillock, MD  oxyCODONE (OXYCONTIN) 10 mg 12 hr tablet Take 1 tablet (10 mg total) by mouth every 12 (twelve) hours. 06/20/15  Yes Oswald Hillock, MD  torsemide (DEMADEX) 20 MG tablet Take 20 mg by mouth daily. Reported on 06/10/2015   Yes Historical Provider, MD  warfarin (COUMADIN) 3 MG tablet Take 4.67m (one and a half pills) everyday except Tuesday and Friday. Take 354m(one pill) on Tuesday and Friday. 05/14/15  Yes Maryann Mikhail, DO  warfarin (COUMADIN) 3 MG tablet Take 4.5 mg by mouth See admin instructions. Pt takes Su,M,Wed,Th,Sa   Yes Historical Provider, MD  enoxaparin (LOVENOX) 80 MG/0.8ML injection Inject 0.7 mLs (70 mg total) into the skin daily. Patient not taking: Reported on 06/26/2015 06/20/15 06/27/15  GaOswald HillockMD   Physical Exam: Filed Vitals:   06/26/15 1615 06/26/15 1630 06/26/15 1700 06/26/15 1715  BP: _0 77/48  Pulse: 78 80 72 79  Temp:      TempSrc:      Resp: _1 39  Height:      Weight:      SpO2: 100% 100% 97% 100%    Wt Readings from Last 3 Encounters:  06/26/15 67.586 kg (149 lb)  06/20/15 67.7 kg (149 lb 4 oz)  06/08/15 56.246 kg (124 lb)    General:  Appears calm and comfortable, kyphosis  Eyes: PERRL, normal lids, irises & conjunctiva ENT: grossly normal hearing, lips & tongue Neck: no LAD, masses or thyromegaly Cardiovascular: IRR, no m/r/g. Respiratory: CTA bilaterally, no w/r/r. Normal respiratory effort. Abdomen: soft, ntnd Skin: left LE with erythema, multiple ulcer, drainage.  Musculoskeletal: grossly normal tone BUE/BLE Psychiatric: grossly normal mood and affect, speech fluent and appropriate Neurologic: grossly non-focal. Kyphosis.           Labs on Admission:  Basic  Metabolic Panel:  Recent Labs Lab 06/20/15 0535 06/26/15 1202  NA  --  136  K  --  3.7  CL  --  98*  CO2  --  25  GLUCOSE  --  109*  BUN  --  47*  CREATININE  --  2.50*  CALCIUM  --  8.4*  MG 2.2  --    Liver Function Tests:  Recent Labs Lab 06/26/15 1202  AST 72*  ALT 43  ALKPHOS 480*  BILITOT 1.0  PROT 6.5  ALBUMIN 2.3*   No results for input(s): LIPASE, AMYLASE in the last 168 hours. No results for input(s): AMMONIA in the last  168 hours. CBC:  Recent Labs Lab 06/20/15 0535 06/26/15 1202  WBC 7.6 19.9*  NEUTROABS  --  17.5*  HGB 10.0* 9.0*  HCT 33.4* 28.7*  MCV 96.3 92.9  PLT 172 149*   Cardiac Enzymes: No results for input(s): CKTOTAL, CKMB, CKMBINDEX, TROPONINI in the last 168 hours.  BNP (last 3 results) No results for input(s): BNP in the last 8760 hours.  ProBNP (last 3 results) No results for input(s): PROBNP in the last 8760 hours.  CBG: No results for input(s): GLUCAP in the last 168 hours.  Radiological Exams on Admission: Dg Chest Port 1 View  06/26/2015  CLINICAL DATA:  Red and swollen LEFT leg EXAM: PORTABLE CHEST 1 VIEW COMPARISON:  Radiograph 06/10/2015 FINDINGS: Stable enlarged cardiac silhouette. Lungs are hyperinflated. There is chronic bronchitic markings. The patient's chin overlies the LEFT lung apex. No infiltrate or pneumothorax IMPRESSION: 1. Hyperinflated lungs with bronchitic change. 2. No acute findings. Electronically Signed   By: Suzy Bouchard M.D.   On: 06/26/2015 12:06   Dg Tibia/fibula Left Port  06/26/2015  CLINICAL DATA:  R50.9 (ICD-10-CM) - Fever RED AND SWOLLEN LT LEG Lower extremity ulceration (HCC) L97.909 EXAM: PORTABLE LEFT TIBIA AND FIBULA - 2 VIEW COMPARISON:  06/10/2015 FINDINGS: Increased periosteal reaction along the distal diaphysis of the LEFT fibula. Potential cortical erosion on the lateral projection is similar to comparison. IMPRESSION: New and increased periosteal reaction involving the distal LEFT  femur infant is concerning for osteomyelitis. Findings could also be found in peripheral vascular disease. Electronically Signed   By: Suzy Bouchard M.D.   On: 06/26/2015 12:14    EKG: Independently reviewed. A fib, rates in the 80  Assessment/Plan Active Problems:   PAD (peripheral artery disease) (HCC)   Atrial fibrillation (HCC)   Hypothyroidism   Wound of lower extremity   Sepsis ([place])   Cellulitis of left lower extremity   Acute renal failure (ARF) ([place])  1-Sepsis; likely related to LE cellulitis infection, concern for osteomyelitis.  Admit to step down unit, might need ICU if BP doesn't improved.  IV fluids.  IV solu cortef stress dose.  Cortisol level. Vancomycin and Zosyn.  Continue to cycle lactic acid.  SBP still in the 70, CCM consulted. Patient Agree with IV pressors for treatment of sepsis.   2-LE cellulitis chronic wound infection.  Recent  left popliteal and tibial revascularization with PTA of the popliteal, anterior tibial, and peroneal arteries by Dr. Earleen Newport on 05/09/2015. She had Venogram done 2-12 with patent vessels.  Will need to consult Dr Earleen Newport in am for further recommendations.  Will check ESR, CRP .  Also CT rule out osteomyelitis.   Acute Renal failure.  Suspect related to hypotension, infection, sepsis.  IV fluids. Strict I and O/   A fib; continue with coumadin holding BB due to hypotension.   Systolic HF; Hold diuretics in setting of sepsis.   Hypothyroidism: Continue with synthroid.   Code Status: DNR, agree with IV pressors for treatment of sepsis, hypotension.  DVT Prophylaxis:on coumadin.  Family Communication: son at bedside.  Disposition Plan: admit to step down, might need ICU depending on BP  Time spent: 75 minutes.   Niel Hummer A [facility] Pager [phone]

## 2015-06-26 NOTE — Consult Note (Addendum)
Name: Kristina Diaz MRN: [id number] DOB: Oct 10, 1929    ADMISSION DATE:  06/26/2015 CONSULTATION DATE:  06/26/2015  REFERRING MD :  Dr. Sunnie Nielsen  CHIEF COMPLAINT:  fever  HISTORY OF PRESENT ILLNESS:  82 year old female with PMH as below, which includes PAD (with significant associated leg wounds L>R, underwent left popliteal and tibial revascularization with PTA of the popliteal, anterior tibial, and peroneal arteries by Dr. Loreta Ave on 05/09/2015), atrial fibrillation, HTN, Hypothyroid, and systolic CHF EF 35-40%. She was recently admitted for sepsis secondary to lower extremity wounds. She was hypotensive on presentation which improved with IVF resuscitation. She was treated with IV antibiotics (vanc/zosyn) for 10 days before being discharged.   3/1 she was found to have fever at assisted living facility. She was send to ED for this reason although she did not feel any different. In the emergency department she was found to be hypotensive with elevated lactic acid. Renal function also worse than on discharge about 1 week prior. Lactic acid cleared with volume, however, BP did not improve. She reports that BP is always low, although she is not sure what the actual numbers are. Chart review revealed that her SBP is typically 100-110.  She was started on IV antibiotics and admitted to hosptialist team, who have requested PCCM consultation.   SIGNIFICANT EVENTS  2/23 discharge 3/1 admit with severe sepsis  STUDIES:  CT tib/fib 3/1 > Diffuse skin thickening and subcutaneous soft tissue edema. No drainable fluid collection/abscess.  PAST MEDICAL HISTORY :   has a past medical history of PAD (peripheral artery disease) (HCC) (2013); Atrial fibrillation (HCC) (2013); Hypertension; Osteoporosis; DVT (deep venous thrombosis) (HCC) (2013); Hypothyroidism; History of blood transfusion (2013); Anemia; Arthritis; Fracture of L4 vertebra (HCC); Lower extremity ulceration (HCC); and Biventricular heart failure with  reduced left ventricular function (HCC).  has past surgical history that includes Thrombectomy (Left, 2013); g2p2; Tonsillectomy and adenoidectomy (~ 1938); and Dilation and curettage of uterus (1960's). Prior to Admission medications   Medication Sig Start Date End Date Taking? Authorizing Provider  Amino Acids-Protein Hydrolys (FEEDING SUPPLEMENT, PRO-STAT SUGAR FREE 64,) LIQD Take 30 mLs by mouth 2 (two) times daily.   Yes Historical Provider, MD  aspirin EC 81 MG EC tablet Take 1 tablet (81 mg total) by mouth daily. 05/13/15  Yes Maryann Mikhail, DO  docusate sodium (COLACE) 100 MG capsule Take 100 mg by mouth daily.   Yes Historical Provider, MD  feeding supplement, ENSURE ENLIVE, (ENSURE ENLIVE) LIQD Take 237 mLs by mouth 2 (two) times daily between meals. 06/20/15  Yes Meredeth Ide, MD  levothyroxine (SYNTHROID, LEVOTHROID) 25 MCG tablet TAKE 1 AND 1/2 TABS (37.5MG ) BY MOUTH EVERY DAY*CHECK PULSE WEEKLY* 07/14/12  Yes Jacques Navy, MD  metoprolol succinate (TOPROL-XL) 25 MG 24 hr tablet Take 0.5 tablets (12.5 mg total) by mouth daily. 11/01/12  Yes Jacques Navy, MD  Multiple Vitamin (MULTIVITAMIN WITH MINERALS) TABS Take 1 tablet by mouth every morning.   Yes Historical Provider, MD  oxyCODONE (OXY IR/ROXICODONE) 5 MG immediate release tablet Take 1 tablet (5 mg total) by mouth every 6 (six) hours as needed for moderate pain or severe pain. 06/20/15  Yes Meredeth Ide, MD  oxyCODONE (OXYCONTIN) 10 mg 12 hr tablet Take 1 tablet (10 mg total) by mouth every 12 (twelve) hours. 06/20/15  Yes Meredeth Ide, MD  torsemide (DEMADEX) 20 MG tablet Take 20 mg by mouth daily. Reported on 06/10/2015   Yes Historical Provider, MD  warfarin (COUMADIN) 3 MG tablet Take 4.5mg  (one and a half pills) everyday except Tuesday and Friday. Take  (one pill) on Tuesday and Friday. 05/14/15  Yes Maryann Mikhail, DO  warfarin (COUMADIN) 3 MG tablet Take 4.5 mg by mouth See admin instructions. Pt takes Su,M,Wed,Th,Sa    Yes Historical Provider, MD  enoxaparin (LOVENOX) 80 MG/0.8ML injection Inject 0.7 mLs (70 mg total) into the skin daily. Patient not taking: Reported on 06/26/2015 06/20/15 06/27/15  Meredeth Ide, MD   Allergies  Allergen Reactions  . Bactrim Ds [Sulfamethoxazole-Trimethoprim] Rash  . Contrast Media [Iodinated Diagnostic Agents] Hives    BLE erythema   . Ciprofloxacin Rash  . Tape Rash    FAMILY HISTORY:  family history includes Arthritis in her mother; Cancer in her brother, mother, and sister; Tuberculosis in her father. There is no history of Diabetes, Heart disease, or COPD. SOCIAL HISTORY:  reports that she has never smoked. She has never used smokeless tobacco. She reports that she drinks alcohol. She reports that she does not use illicit drugs.  REVIEW OF SYSTEMS:   Bolds are positive  Constitutional: weight loss, gain, night sweats, Fevers, chills, fatigue .  HEENT: headaches, Sore throat, sneezing, nasal congestion, post nasal drip, Difficulty swallowing, Tooth/dental problems, visual complaints visual changes, ear ache CV:  chest pain, radiates: ,Orthopnea, PND, swelling in lower extremities, dizziness, palpitations, syncope.  GI  heartburn, indigestion, abdominal pain, nausea, vomiting, diarrhea, change in bowel habits, loss of appetite, bloody stools.  Resp: cough, productive: , hemoptysis, dyspnea, chest pain, pleuritic.  Skin: rash or itching or icterus GU: dysuria, change in color of urine, urgency or frequency. flank pain, hematuria  MS: b/l leg pain or swelling. decreased range of motion  Psych: change in mood or affect. depression or anxiety.  Neuro: difficulty with speech, weakness, numbness, ataxia   SUBJECTIVE:   VITAL SIGNS: Temp:  [98.5 F (36.9 C)] 98.5 F (36.9 C) (03/01 1106) Pulse Rate:  [54-113] 79 (03/01 1900) Resp:  [14-39] 14 (03/01 1900) BP: (68-90)/(33-51) 90/51 mmHg (03/01 1900) SpO2:  [97 %-100 %] 100 % (03/01 1900) Weight:  [67.586 kg (149 lb)]  67.586 kg (149 lb) (03/01 1053)  PHYSICAL EXAMINATION: General:  Chronically ill appearing female in NAD Neuro:  Alert, oriented, non-focal. Mentation normal HEENT:  [place]/AT, PERRL, no appreciable JVD Cardiovascular:  IRIR, no MRG Lungs:  Clear bilateral breath sounds Abdomen:  Soft, non-tender, non-distended Musculoskeletal:  No acute deformity or ROM limiation Skin:  Significant ulceration of LLE, more mild RLE   Recent Labs Lab 06/26/15 1202  NA 136  K 3.7  CL 98*  CO2 25  BUN 47*  CREATININE 2.50*  GLUCOSE 109*    Recent Labs Lab 06/20/15 0535 06/26/15 1202  HGB 10.0* 9.0*  HCT 33.4* 28.7*  WBC 7.6 19.9*  PLT 172 149*   Ct Tibia Fibula Left Wo Contrast  06/26/2015  CLINICAL DATA:  82 year old female with infected wound in the mid aspect of the tibia and fibula. EXAM: CT TIBIA FIBULA LEFT WITHOUT CONTRAST TECHNIQUE: Multidetector CT imaging was performed according to the standard protocol. Multiplanar CT image reconstructions were also generated. COMPARISON:  06/26/2015 FINDINGS: Evaluation of this exam is limited in the absence of intravenous contrast. There is no acute fracture or dislocation. The bones are osteopenic. There is osteoarthritic changes of the knee with compartmental narrowing most prominent involving the medial compartment. There is diffuse subcutaneous soft tissue edema of the left lower extremity. There is diffuse thickening of  the skin in the distal aspect of the left lower extremity and left ankle, likely related to chronic inflammation and cellulitis. No drainable fluid collection/abscess identified. Diffuse Vascular calcifications noted. IMPRESSION: Diffuse skin thickening and subcutaneous soft tissue edema. No drainable fluid collection/abscess. Electronically Signed   By: Elgie Collard M.D.   On: 06/26/2015 18:36   Dg Chest Port 1 View  06/26/2015  CLINICAL DATA:  Red and swollen LEFT leg EXAM: PORTABLE CHEST 1 VIEW COMPARISON:  Radiograph 06/10/2015  FINDINGS: Stable enlarged cardiac silhouette. Lungs are hyperinflated. There is chronic bronchitic markings. The patient's chin overlies the LEFT lung apex. No infiltrate or pneumothorax IMPRESSION: 1. Hyperinflated lungs with bronchitic change. 2. No acute findings. Electronically Signed   By: Genevive Bi M.D.   On: 06/26/2015 12:06   Dg Tibia/fibula Left Port  06/26/2015  CLINICAL DATA:  R50.9 (ICD-10-CM) - Fever RED AND SWOLLEN LT LEG Lower extremity ulceration (HCC) L97.909 EXAM: PORTABLE LEFT TIBIA AND FIBULA - 2 VIEW COMPARISON:  06/10/2015 FINDINGS: Increased periosteal reaction along the distal diaphysis of the LEFT fibula. Potential cortical erosion on the lateral projection is similar to comparison. IMPRESSION: New and increased periosteal reaction involving the distal LEFT femur infant is concerning for osteomyelitis. Findings could also be found in peripheral vascular disease. Electronically Signed   By: Genevive Bi M.D.   On: 06/26/2015 12:14    ASSESSMENT / PLAN:  Severe sepsis/septic shock secondary to chronically infected BLE wounds. 82 year old female presents again with sepsis due to lower extremity wounds. Hypotensive in ER. Lactic cleared post fluids but pressure did not. AKI also initially. Started on ABX. Patient is DNR, but told admitting physician that she may consider pressors. I spoke with Mrs Shippee and she was very reluctant to allow pressors and central venous access into her treatment regimen. Her mental status is 100% intact. RNs placing foley to eval UOP. Will repeat BMP. If making urine and renal function improving in the setting of cleared lactic and good MS, reasonable to think she is being perfused well and to just maintain gentle IVF and ABX. If not improving, can revisit the pressor/CVL conversation. She agrees to this plan.   Plan: - Telemetry monitoring - MAP goal > 18mm/Hg as she is chronically hypotensive - Gentle IVF resuscitation - Will repeat BMP and  place foley to assess renal function - Continue broad spectrum antibiotics - Agree stress dose steroids for short term - SDU admission under hospitalist team, if renal function or UOP not improving then can revisit pressor conversation.  Atrial Fib Chronic systolic CHF Hypothyroidism - Per primary  Will follow along  Joneen Roach, AGACNP-BC [facility] Pager [phone] or [phone]  06/26/2015 8:04 PM  STAFF NOTE: Cindi Carbon, MD FACP have personally reviewed patient's available data, including medical history, events of note, physical examination and test results as part of my evaluation. I have discussed with resident/NP and other care providers such as pharmacist, RN and RRT. In addition, I personally evaluated patient and elicited key findings of: awake, oriented, no distress, leg erythema, wrapped, swollen, xray and CT reviewed, osteo concerns with sepsis, she has had pos balance and responded well to fluids, LActic has cleared well and does not need to be re assessed, maintain Vosyn for empiric broad coverage, consider ID consult for duration and abx regimen, she states that she runs sys 80 as outpt and has sig PVD making cuff inaccurate, she is perfusing well and awake, she does not  want heroics and line would avoid especially with inr noted, consider vit k  Low dose, also given her chf, EF I would have sys goals 50 with good MS and outptu 10-15 cc/hr goals, no role icu admission at this stage, will sign off call if needed Not on chronic steroids, cortisol greater 100, would dc stress steroids  Mcarthur Rossetti. Tyson Alias, MD, FACP Pgr: [phone] [facility] 06/27/2015 8:28 AM

## 2015-06-26 NOTE — Progress Notes (Signed)
Pharmacy Code Sepsis Protocol  Time of code sepsis page: 1124  Antibiotics delivered at 1129  Antibiotics administered prior to code at (if checked, omit next 2 questions)  Were antibiotics ordered at the time of the code sepsis page? Yes Was it required to contact the physician?  Physician not contacted  Physician contacted to order antibiotics for code sepsis  Physician contacted to recommend changing antibiotics  Pharmacy consulted for: vancomycin and zosyn  Anti-infectives    Start     Dose/Rate Route Frequency Ordered Stop   06/26/15 1115  piperacillin-tazobactam (ZOSYN) IVPB 3.375 g     3.375 g 100 mL/hr over 30 Minutes Intravenous  Once 06/26/15 1112     06/26/15 1115  vancomycin (VANCOCIN) IVPB 1000 mg/200 mL premix     1,000 mg 200 mL/hr over 60 Minutes Intravenous  Once 06/26/15 1112          Nurse education provided:  Minutes left to administer antibiotics to achieve 1 hour goal  Correct order of antibiotic administration  Antibiotic Y-site compatibilities     Jemuel Laursen, Drake Leach, PharmD 06/26/2015, 11:29 AM

## 2015-06-26 NOTE — Progress Notes (Signed)
Pharmacy Antibiotic Note  Synai Prettyman is a 82 y.o. female admitted on 06/26/2015 with cellulitis.  Pharmacy has been consulted for vancomycin and zosyn dosing. Pt is afebrile and WBC is elevated at 19.9. SCr is elevated above baseline at 2.5.   Plan: - Vancomycin 1gm IV x 1 then  IV Q48H - Zosyn 3.375gm IV x 1 then 2.25gm IV Q8H - F/u renal fxn, C&S, clinical status and trough at SS  Height:  (162.6 cm) Weight: 149 lb (67.586 kg) IBW/kg (Calculated) : 54.7  Temp (24hrs), Avg:98.5 F (36.9 C), Min:98.5 F (36.9 C), Max:98.5 F (36.9 C)   Recent Labs Lab 06/20/15 0535 06/26/15 1202 06/26/15 1208  WBC 7.6 19.9*  --   CREATININE  --  2.50*  --   LATICACIDVEN  --   --  2.64*    Estimated Creatinine Clearance: 16.1 mL/min (by C-G formula based on Cr of 2.5).    Allergies  Allergen Reactions  . Bactrim Ds [Sulfamethoxazole-Trimethoprim] Rash  . Contrast Media [Iodinated Diagnostic Agents] Hives    BLE erythema   . Ciprofloxacin Rash  . Tape Rash    Antimicrobials this admission: Vanc 3/1>> Zosyn 3/1>>  Dose adjustments this admission: N/A  Microbiology results: Pending  Thank you for allowing pharmacy to be a part of this patient's care.  Shandreka Dante, Drake Leach 06/26/2015 12:45 PM

## 2015-06-26 NOTE — ED Notes (Signed)
PCCM at bedside. °

## 2015-06-26 NOTE — Telephone Encounter (Signed)
Called [place] and spoke to [place], told her pt needs to go to the ED to be evaluated especially since her temp it 103. Marylene Land verbalized understanding and stated her temp in 100.3 not 103. Told her okay but pt was told yesterday to go to ED and refused and was told to be seen today and she is not on our schedule so she needs to go to ED to be evaluated. Marylene Land verbalized understanding.

## 2015-06-26 NOTE — Telephone Encounter (Signed)
[facility] called to advise pt's temp is 103. Kristina Diaz would like to know if you want to send her to hospital? However, pt has wounds and is susceptible to septic.  Please advise on what they need to do. Ask for wellness

## 2015-06-26 NOTE — ED Notes (Signed)
Code Sepsis. 

## 2015-06-26 NOTE — Progress Notes (Signed)
ANTICOAGULATION CONSULT NOTE - Initial Consult  Pharmacy Consult for Coumadin Indication: AFib and Hx DVT  Allergies  Allergen Reactions  . Bactrim Ds [Sulfamethoxazole-Trimethoprim] Rash  . Contrast Media [Iodinated Diagnostic Agents] Hives    BLE erythema   . Ciprofloxacin Rash  . Tape Rash    Patient Measurements: Height:  (162.6 cm) Weight: 149 lb (67.586 kg) IBW/kg (Calculated) : 54.7   Vital Signs: Temp: 95.7 F (35.4 C) (03/01 2045) Temp Source: Oral (03/01 1106) BP: 88/50 mmHg (03/01 2045) Pulse Rate: 85 (03/01 2045)  Labs:  Recent Labs  06/25/15 06/26/15 1202 06/26/15 1834  HGB  --  9.0*  --   HCT  --  28.7*  --   PLT  --  149*  --   APTT  --   --  57*  LABPROT  --   --  46.6*  INR 2.83  --  5.31*  CREATININE  --  2.50*  --     Estimated Creatinine Clearance: 16.1 mL/min (by C-G formula based on Cr of 2.5).   Medical History: Past Medical History  Diagnosis Date  . PAD (peripheral artery disease) (HCC) 2013    left leg. sounds like she had angio-plasty   . Atrial fibrillation (HCC) 2013    [facility] that failed. ON medication  . Hypertension   . Osteoporosis   . DVT (deep venous thrombosis) (HCC) 2013    "LLE" (08/04/2012)  . Hypothyroidism   . History of blood transfusion 2013    "related to thrombectomy" (08/04/2012)  . Anemia     "slightly" (08/04/2012)  . Arthritis     "probably minor; knees, pinky" (08/04/2012)  . Fracture of L4 vertebra (HCC)     "dx'd in 2013; can't treat til legs fixed" (08/04/2012)  . Lower extremity ulceration (HCC)   . Biventricular heart failure with reduced left ventricular function (HCC)      Assessment: Warfarin PTA for AF and Hx of DVT. Admitted recently and discharged on warfarin with lovenox to bridge.  STAT INR 5.31. Will hold coumadin   PTA dose = 4.5 mg daily except 3 mg on Tuesdays/Fridays per clinic note. However, dose per nursing home administration guide is 4.5mg  daily on Su, M, Wed, Th, Sat.  Will try to clarify with pt if able.  Goal of Therapy:  INR 2-3 Monitor platelets by anticoagulation protocol: Yes   Plan:  Hold coumadin tonight Daily INR/CBC Monitor for s/s of bleeding   Charly Holcomb C. Marvis Moeller, PharmD Pharmacy Resident  Pager: [phone] 06/26/2015 9:25 PM

## 2015-06-27 DIAGNOSIS — R509 Fever, unspecified: Secondary | ICD-10-CM | POA: Diagnosis present

## 2015-06-27 DIAGNOSIS — I420 Dilated cardiomyopathy: Secondary | ICD-10-CM | POA: Diagnosis present

## 2015-06-27 DIAGNOSIS — T148 Other injury of unspecified body region: Secondary | ICD-10-CM

## 2015-06-27 DIAGNOSIS — N179 Acute kidney failure, unspecified: Secondary | ICD-10-CM

## 2015-06-27 DIAGNOSIS — T148XXA Other injury of unspecified body region, initial encounter: Secondary | ICD-10-CM

## 2015-06-27 DIAGNOSIS — I9589 Other hypotension: Secondary | ICD-10-CM

## 2015-06-27 DIAGNOSIS — I480 Paroxysmal atrial fibrillation: Secondary | ICD-10-CM

## 2015-06-27 DIAGNOSIS — R791 Abnormal coagulation profile: Secondary | ICD-10-CM | POA: Diagnosis present

## 2015-06-27 DIAGNOSIS — L089 Local infection of the skin and subcutaneous tissue, unspecified: Secondary | ICD-10-CM

## 2015-06-27 DIAGNOSIS — N170 Acute kidney failure with tubular necrosis: Secondary | ICD-10-CM

## 2015-06-27 DIAGNOSIS — L03116 Cellulitis of left lower limb: Secondary | ICD-10-CM

## 2015-06-27 DIAGNOSIS — I5022 Chronic systolic (congestive) heart failure: Secondary | ICD-10-CM | POA: Diagnosis present

## 2015-06-27 LAB — COMPREHENSIVE METABOLIC PANEL
ALK PHOS: 387 U/L — AB (ref 38–126)
ALT: 37 U/L (ref 14–54)
AST: 50 U/L — ABNORMAL HIGH (ref 15–41)
Albumin: 2 g/dL — ABNORMAL LOW (ref 3.5–5.0)
Anion gap: 11 (ref 5–15)
BUN: 37 mg/dL — ABNORMAL HIGH (ref 6–20)
CALCIUM: 7.9 mg/dL — AB (ref 8.9–10.3)
CO2: 24 mmol/L (ref 22–32)
CREATININE: 1.76 mg/dL — AB (ref 0.44–1.00)
Chloride: 106 mmol/L (ref 101–111)
GFR calc non Af Amer: 26 mL/min — ABNORMAL LOW (ref >60)
GFR, EST AFRICAN AMERICAN: 30 mL/min — AB (ref >60)
GLUCOSE: 101 mg/dL — AB (ref 65–99)
Potassium: 3.4 mmol/L — ABNORMAL LOW (ref 3.5–5.1)
SODIUM: 141 mmol/L (ref 135–145)
Total Bilirubin: 0.9 mg/dL (ref 0.3–1.2)
Total Protein: 5.6 g/dL — ABNORMAL LOW (ref 6.5–8.1)

## 2015-06-27 LAB — CBC
HEMATOCRIT: 25.7 % — AB (ref 36.0–46.0)
HEMOGLOBIN: 8.3 g/dL — AB (ref 12.0–15.0)
MCH: 29.9 pg (ref 26.0–34.0)
MCHC: 32.3 g/dL (ref 30.0–36.0)
MCV: 92.4 fL (ref 78.0–100.0)
Platelets: 111 10*3/uL — ABNORMAL LOW (ref 150–400)
RBC: 2.78 MIL/uL — ABNORMAL LOW (ref 3.87–5.11)
RDW: 20.7 % — ABNORMAL HIGH (ref 11.5–15.5)
WBC: 13.2 10*3/uL — ABNORMAL HIGH (ref 4.0–10.5)

## 2015-06-27 LAB — MRSA PCR SCREENING: MRSA by PCR: NEGATIVE

## 2015-06-27 LAB — LACTIC ACID, PLASMA: Lactic Acid, Venous: 1.1 mmol/L (ref 0.5–2.0)

## 2015-06-27 LAB — PROTIME-INR
INR: 5.84 (ref 0.00–1.49)
Prothrombin Time: 50.5 seconds — ABNORMAL HIGH (ref 11.6–15.2)

## 2015-06-27 LAB — MAGNESIUM: MAGNESIUM: 1.8 mg/dL (ref 1.7–2.4)

## 2015-06-27 MED ORDER — VANCOMYCIN HCL 500 MG IV SOLR
500.0000 mg | INTRAVENOUS | Status: DC
  Administered 2015-06-27 – 2015-06-28 (×2): 500 mg via INTRAVENOUS
  Filled 2015-06-27 (×3): qty 500

## 2015-06-27 MED ORDER — SODIUM CHLORIDE 0.9 % IV SOLN: INTRAVENOUS | Status: DC

## 2015-06-27 MED ORDER — MAGNESIUM OXIDE 400 (241.3 MG) MG PO TABS
400.0000 mg | ORAL_TABLET | Freq: Once | ORAL | Status: AC
  Administered 2015-06-27: 400 mg via ORAL
  Filled 2015-06-27: qty 1

## 2015-06-27 MED ORDER — OXYCODONE HCL 5 MG PO TABS
5.0000 mg | ORAL_TABLET | Freq: Once | ORAL | Status: AC
Start: 2015-06-27 — End: 2015-06-27
  Administered 2015-06-27: 5 mg via ORAL
  Filled 2015-06-27: qty 1

## 2015-06-27 MED ORDER — SODIUM CHLORIDE 0.9 % IV BOLUS (SEPSIS)
500.0000 mL | Freq: Once | INTRAVENOUS | Status: AC
  Administered 2015-06-27: 500 mL via INTRAVENOUS

## 2015-06-27 MED ORDER — PIPERACILLIN-TAZOBACTAM 3.375 G IVPB
3.3750 g | Freq: Three times a day (TID) | INTRAVENOUS | Status: DC
  Administered 2015-06-27 – 2015-07-03 (×19): 3.375 g via INTRAVENOUS
  Filled 2015-06-27 (×23): qty 50

## 2015-06-27 MED ORDER — COLLAGENASE 250 UNIT/GM EX OINT
TOPICAL_OINTMENT | Freq: Every day | CUTANEOUS | Status: DC
  Administered 2015-06-27 – 2015-07-07 (×11): via TOPICAL
  Filled 2015-06-27 (×7): qty 30

## 2015-06-27 MED ORDER — POTASSIUM CHLORIDE CRYS ER 20 MEQ PO TBCR
40.0000 meq | EXTENDED_RELEASE_TABLET | Freq: Once | ORAL | Status: AC
  Administered 2015-06-27: 40 meq via ORAL
  Filled 2015-06-27: qty 2

## 2015-06-27 MED ORDER — WARFARIN - PHARMACIST DOSING INPATIENT: Freq: Every day | Status: DC

## 2015-06-27 NOTE — Progress Notes (Signed)
Critical value received: PT-50.5 & INR- 5.84  Date of notification: 06/27/2015  Time of notification: 0608  Critical value read back:Yes.   Nurse who received alert: Irving Copas  MD notified (1st page): Merdis Delay, NP  Time of first page: 0612  MD notified (2nd page):  Time of second page:

## 2015-06-27 NOTE — Progress Notes (Signed)
Pt has multiple unstageable venous stasis ulcers to left lower leg. The largest ulcer covers most of the back of the calf as well as wraps around to front of the lower extremity. Its length is 23cm and in its widest portion up to 13cm wide. On the front of the left lower leg is the second largest wound 11cm in length and 13cm at the widest width. There are two smaller wounds on the front of the heel. The one in the center of the heel is 3cmx3cm and the wound on the medial side of the front of the heel is 3cmx2cm. All wounds were cleansed and wrapped with telfa non adherent dressings, ABD pads and kerlix. Wound Care consult also put in for the patient

## 2015-06-27 NOTE — Progress Notes (Signed)
eLink Physician-Brief Progress Note Patient Name: Tiffani Kadow DOB: 1930-02-13 MRN: [id number]   Date of Service  06/27/2015  HPI/Events of Note  Low BP, arousable, A and O x 4. BP improves when pt awake. Pt has no central line  eICU Interventions  Pt desires conservative management, will tolerate low BP for now, as she otherwise appears stable with normal mentation.         Shane Crutch 06/27/2015, 3:36 AM

## 2015-06-27 NOTE — Clinical Documentation Improvement (Signed)
Internal Medicine  A cause and effect relationship may not be assumed and must be documented by a provider.  Please clarify the relationship, if any, between Sepsis with Septic Shock and Surgical Procedure performed on 05/09/15.  Please document findings in next progress note NOT in BPA drop down box.  Are the conditions:   Due to or associated with each other  Unrelated to each other  Other  Clinically Undetermined  Supporting Information (risk factors, sign and symptoms, diagnostics, treatment):  Left tibial and popliteal revascularization performed on 05/09/15  Readmitted 06/10/15 with fever and infection LE, worsening drainage from wound  Readmitted again on 3/1 with Sepsis and Septic Shock with persistent LE redness and drainage   Please exercise your independent, professional judgment when responding. A specific answer is not anticipated or expected.  Thank You,  Shellee Milo RN, BSN, CCDS Health Information Management [place] [phone]; Cell: [phone]

## 2015-06-27 NOTE — Progress Notes (Signed)
[facility] TEAM 1 - Stepdown/ICU TEAM Progress Note  Kristina Diaz WUJ:[id number] DOB: 23-Mar-1930 DOA: 06/26/2015 PCP: [name], MD  Admit HPI / Brief Narrative: 82 y.o. year-old WF PMHx  atrial fibrillation, PVD, heart failure Ef 45 to 50 % LLE DVT, chronic left leg wound who presents with fever.  The patient has a history of nonhealing ulcers of the left lower extremity and underwent left popliteal and tibial revascularization with PTA of the popliteal, anterior tibial, and peroneal arteries by Dr. Loreta Ave on 05/09/2015. Postprocedure, she had a fall and was found to have a hematoma at the left SFA access which resulted in an drop in her hemoglobin. She was admitted at that time and received blood transfusion. She was recently admitted 2-13 for fever and infection LE, worsening drainage from wound. She was treated with IV antibiotics and discharge to facility. She had Venogram done 2-12 with patent vessels.  She presents today 3-01 with fevers, persistent LE redness, and drainage. Evaluation in the ED; SBP in the 80, initial lactic acid initially at 2.6 , decrease to 1.5. X ray LE; new and increased periosteal reaction involving distal left Femur.  WBC at 19. Chest x ray bronchitic changes. Cr 2.5   HPI/Subjective: 3/1 A/O 4, NAD.  Assessment/Plan: -Sepsis; likely related to LE cellulitis infection, concern for osteomyelitis.  -Cortisol level. Significantly Elevated -Continue Vancomycin and Zosyn.  -Continue to cycle lactic acid.  SBP still in the 70, CCM consulted. Patient Agree with IV pressors for treatment of sepsis.   LE cellulitis chronic wound infection.  Recent left popliteal and tibial revascularization with PTA of the popliteal, anterior tibial, and peroneal arteries by Dr. Ruthy Dick vascular surgery on 05/09/2015. -She had Venogram done 2-12 with patent vessels.  -Reconsult Dr. Ruthy Dick vascular surgery in [place] for further recommendations.  -CRP elevated at  17.3 not unexpected given patient's infection/inflammation.  -Left tib-fib CT; negative osteomyelitis.   Acute Renal failure.  Suspect related to hypotension, infection, sepsis.  IV fluids. Strict I and O/   A fib; continue with coumadin holding BB due to hypotension.   Chronic Systolic CHF/Dilated Cardiomyopathy; Hold diuretics in setting of sepsis.  -Strict in and out -Daily weight  Hypotension  -Bolus 500 ml Normal Saline -Normal saline 30 ml/hr  Hypothyroidism: Continue with synthroid.   Supratherapeutic INR -Coumadin DC'd, follow daily INR allowed to trend down and then restart when therapeutic  Hypokalemia -Potassium goal> 4 -K Dur 40 mEq  Hypomagnesemia -Magnesium goal> 2 -Mag-Ox 400 mg    Goals of care -Discussed  with patient CODE STATUS. Patient stated she should've been DO NOT RESUSCITATE. Will change the record to indicate patient's decision   Code Status: DO NOT RESUSCITATE Family Communication: no family present at time of exam Disposition Plan: Back to SNF    Consultants: NA  Procedure/Significant Events: 2/15 echocardiogram;LVEF= 35% to 40%. Diffuse hypokinesis. - Ventricular septum: Septal motion showed paradox. -Mitral valve:moderate regurgitation.- Right ventricle: moderately dilated.  - Right atrium:  moderately dilated. -Tricuspid valve: moderate regurgitation.-Pulmonic valve: moderate regurgitation.-- Pulmonary arteries: PA peak pressure: 46 mm Hg (S). 3/1 CT left tib-fib;Diffuse skin thickening and subcutaneous soft tissue edema. Negative abscess .   Culture 3/1 blood right forearm/left hand NGTD 3/1 urine pending 3/1 MRSA by PCR negative  Antibiotics: Zosyn 3/1>> Vancomycin 3/1>>   DVT prophylaxis: Coumadin on hold supratherapeutic   Devices NA   LINES / TUBES:  NA    Continuous Infusions: . sodium chloride 75 mL/hr at 06/27/15  1141  . sodium chloride 100 mL/hr at 06/26/15 1805  . sodium chloride       Objective: VITAL SIGNS: Temp: 97.7 F (36.5 C) (03/02 1900) Temp Source: Oral (03/02 1900) BP: 104/63 mmHg (03/02 1900) Pulse Rate: 81 (03/02 1900) SPO2; FIO2:   Intake/Output Summary (Last 24 hours) at 06/27/15 2017 Last data filed at 06/27/15 0935  Gross per 24 hour  Intake    500 ml  Output    400 ml  Net    100 ml     Exam: General: A/O 4, NAD., No acute respiratory distress Eyes: Negative headache, negative scleral hemorrhage ENT: Negative Runny nose, negative gingival bleeding, Neck:  Negative scars, masses, torticollis, lymphadenopathy, JVD Lungs: Clear to auscultation bilaterally without wheezes or crackles Cardiovascular: Irregular irregular rhythm and rate, hard loud systolic murmur 5/6 negative  gallop or rub normal S1 and S2 Abdomen:negative abdominal pain, nondistended, positive soft, bowel sounds, no rebound, no ascites, no appreciable mass Extremities: positive bilateral lower extremity cyanosis, warm to the touch, bilateral positive pedal pulses Rt >> Lt, left leg with clean bandage change today did not take down.  Psychiatric:  Negative depression, negative anxiety, negative fatigue, negative mania  Neurologic:  Cranial nerves II through XII intact, tongue/uvula midline, all extremities muscle strength 5/5, sensation intact throughout, negative dysarthria, negative expressive aphasia, negative receptive aphasia.   Data Reviewed: Basic Metabolic Panel:  Recent Labs Lab 06/26/15 1202 06/26/15 2048 06/27/15 0847  NA 136 140 141  K 3.7 3.4* 3.4*  CL 98* 105 106  CO2 GLUCOSE 109* 109* 101*  BUN 47* 42* 37*  CREATININE 2.50* 2.05* 1.76*  CALCIUM 8.4* 7.7* 7.9*  MG  --   --  1.8   Liver Function Tests:  Recent Labs Lab 06/26/15 1202 06/27/15 0847  AST 72* 50*  ALT 43 37  ALKPHOS 480* 387*  BILITOT 1.0 0.9  PROT 6.5 5.6*  ALBUMIN 2.3* 2.0*   No results for input(s): LIPASE, AMYLASE in the last 168 hours. No results for  input(s): AMMONIA in the last 168 hours. CBC:  Recent Labs Lab 06/26/15 1202 06/27/15 0430  WBC 19.9* 13.2*  NEUTROABS 17.5*  --   HGB 9.0* 8.3*  HCT 28.7* 25.7*  MCV 92.9 92.4  PLT 149* 111*   Cardiac Enzymes: No results for input(s): CKTOTAL, CKMB, CKMBINDEX, TROPONINI in the last 168 hours. BNP (last 3 results) No results for input(s): BNP in the last 8760 hours.  ProBNP (last 3 results) No results for input(s): PROBNP in the last 8760 hours.  CBG: No results for input(s): GLUCAP in the last 168 hours.  Recent Results (from the past 240 hour(s))  Blood Culture (routine x 2)     Status: None (Preliminary result)   Collection Time: 06/26/15 11:50 AM  Result Value Ref Range Status   Specimen Description BLOOD RIGHT FOREARM  Final   Special Requests BOTTLES DRAWN AEROBIC AND ANAEROBIC  Final   Culture NO GROWTH 1 DAY  Final   Report Status PENDING  Incomplete  Blood Culture (routine x 2)     Status: None (Preliminary result)   Collection Time: 06/26/15 12:10 PM  Result Value Ref Range Status   Specimen Description BLOOD LEFT HAND  Final   Special Requests BOTTLES DRAWN AEROBIC ONLY 5CCS  Final   Culture NO GROWTH 1 DAY  Final   Report Status PENDING  Incomplete  Urine culture     Status: None (Preliminary result)  Collection Time: 06/26/15  7:56 PM  Result Value Ref Range Status   Specimen Description URINE, RANDOM  Final   Special Requests NONE  Final   Culture NO GROWTH < 24 HOURS  Final   Report Status PENDING  Incomplete  MRSA PCR Screening     Status: None   Collection Time: 06/26/15  9:49 PM  Result Value Ref Range Status   MRSA by PCR NEGATIVE NEGATIVE Final    Comment:        The GeneXpert MRSA Assay (FDA approved for NASAL specimens only), is one component of a comprehensive MRSA colonization surveillance program. It is not intended to diagnose MRSA infection nor to guide or monitor treatment for MRSA infections.      Studies:  Recent  x-ray studies have been reviewed in detail by the Attending Physician  Scheduled Meds:  Scheduled Meds: . aspirin EC  81 mg Oral Daily  . collagenase   Topical Daily  . feeding supplement (ENSURE ENLIVE)  237 mL Oral BID BM  . feeding supplement (PRO-STAT SUGAR FREE 64)  30 mL Oral BID  . levothyroxine  37.5 mcg Oral QAC breakfast  . magnesium oxide  400 mg Oral Once  . multivitamin with minerals  1 tablet Oral q morning - 10a  . piperacillin-tazobactam (ZOSYN)  IV  3.375 g Intravenous 3 times per day  . potassium chloride  40 mEq Oral Once  . vancomycin  500 mg Intravenous Q24H    Time spent on care of this patient: 40 mins   WOODS, Kristina Diaz , MD  [facility] Office  [phone] Pager [phone]  On-Call/Text Page:      [email or web address]      password [email or web address]  If 7PM-7AM, please contact night-coverage [email or web address] Password [email or web address] 06/27/2015, 8:17 PM   LOS: 1 day   Care during the described time interval was provided by me .  I have reviewed this patient's available data, including medical history, events of note, physical examination, and all test results as part of my evaluation. I have personally reviewed and interpreted all radiology studies.   Carolyne Littles, MD [phone] Pager

## 2015-06-27 NOTE — Progress Notes (Signed)
E link notified of BP of 78/36. Pt resting comfortably with no complaints. Notified to give bolus

## 2015-06-27 NOTE — Progress Notes (Signed)
06/27/2015 Patient foley cath was remove at 1900 and she had 500cc of clear, yellow urine. Lovie Macadamia RN

## 2015-06-27 NOTE — Care Management Note (Signed)
Case Management Note  Patient Details  Name: Kristina Diaz MRN: [id number] Date of Birth: 1929/12/02  Subjective/Objective:    Pt is a resident of [facility], is active with [facility] for RN and PT services.  CSW will follow.                  Expected Discharge Plan:  Assisted Living / Rest Home  In-House Referral:  Clinical Social Work  Discharge planning Services  CM Consult  Post Acute Care Choice:  Home Health  HH Arranged:  RN, PT [facility] Agency:  [facility]  Status of Service:  In process, will continue to follow  Magdalene River, RN 06/27/2015, 3:08 PM

## 2015-06-27 NOTE — Consult Note (Addendum)
WOC wound consult note Pt familiar to WOC from previous admission on 2/23; consult requested for left leg chronic wounds.Pt is followed as an outpatient by the wound care center. She uses Santyl prior to admission and recieves serial debridements, and recently had a revascularization procedure to the left leg. Wound type: Chronic full thickness stasis ulcers wounds to left anterior and posterior calf; these are very painful. Pt medicated prior to procedure but still had a large amt pain with the dressing change. Measurement: It is difficult to measure separate wounds, since almost the entire anterior and posterior calf is affected with full thickness tissue loss.  Approximate posterior leg wound size is 14X10X.2cm, 70% slough, 30% red, mod amt yellow drainage, no odor. Anterior leg covers 80% of the calf surface area and is 60% red, 40% yellow, mod amt yellow drainage, no odor.  Left upper inner calf wound has occurred since the last admission; 10X2X.2cm; 80% red, 20% yellow, mod amt tan drainage, no odor.  Left foot 1.5X1.5X.2cm, 100% yellow slough, mod amt yellow drainage, no odor.  Right leg with partial thickness wounds which have occurred since the last admission; 1.2X.1.2X.1cm, 2X4X.1cm, 1X1X.1cm, .5X.5X.1cm, all red and moist, scant amt yellow drainage, no odor. Dressing procedure/placement/frequency: Foam dressings to promote healing to right leg wounds, float heels to reduce pressure. Orders provided for bedside nurse to continue present plan of care as previously ordered with Santyl for chemical debridement and nonadherent dressings to decrease pain with dressing changes.  Pt states the wound care center is no longer using an ace wrap or coban layer for compression. She can resume follow-up at the outpatient wound care center after discharge. Please re-consult if further assistance is needed.  Mardee Postin MSN, RN, CWOCN, [place], CNS [phone],

## 2015-06-27 NOTE — Progress Notes (Signed)
ANTICOAGULATION & ANTIBIOTIC CONSULT NOTE - Follow Up Consult  Pharmacy Consult for Warfarin & Vancomycin + Zosyn Indication: Hx DVT/Afib & r/o sepsis due to infected BLE wounds  Allergies  Allergen Reactions  . Bactrim Ds [Sulfamethoxazole-Trimethoprim] Rash  . Contrast Media [Iodinated Diagnostic Agents] Hives    BLE erythema   . Ciprofloxacin Rash  . Tape Rash    Patient Measurements: Height:  (162.6 cm) Weight: 136 lb 0.4 oz (61.7 kg) IBW/kg (Calculated) : 54.7  Vital Signs: Temp: 96.1 F (35.6 C) (03/02 0800) Temp Source: Core (Comment) (03/02 0800) BP: 90/50 mmHg (03/02 0800) Pulse Rate: 81 (03/02 0800)  Labs:  Recent Labs  06/25/15 06/26/15 1202 06/26/15 1834 06/26/15 2048 06/27/15 0430 06/27/15 0847  HGB  --  9.0*  --   --  8.3*  --   HCT  --  28.7*  --   --  25.7*  --   PLT  --  149*  --   --  111*  --   APTT  --   --  57*  --   --   --   LABPROT  --   --  46.6*  --  50.5*  --   INR 2.83  --  5.31*  --  5.84*  --   CREATININE  --  2.50*  --  2.05*  --  1.76*    Estimated Creatinine Clearance: 20.9 mL/min (by C-G formula based on Cr of 1.76).   Assessment: 54 YOF with hx Afib/DVT and chronic leg wounds who presented on 3/1 with fever. There was concern for sepsis and wound infections so pharmacy was consulted to start Vancomycin + Zosyn for empiric coverage and to resume warfarin from PTA for hx Afib/DVT.  INR today remains SUPRAtherapeutic despite holding the dose yesterday evening (INR 5.84 << 5.31, goal of 2-3). Hgb 8.3 << 9, plts 111. No overt s/sx of bleeding noted.   The patient was noted to have a degree of AKI on admit with SCr of 2.5 - now improved and trended down to 1.76, estimated CrCl~20-25 ml/mi. Will adjust Vancomycin + Zosyn doses today.  Goal of Therapy:  INR 2-3 Proper antibiotics for infection/cultures adjusted for renal/hepatic function    Plan:  1. Hold warfarin today 2. Adjust Vancomycin to 500 mg IV every 24 hours 3.  Adjust Zosyn to 3.375g IV every 8 hours (infused over 4 hours) 4. Will continue to monitor for any signs/symptoms of bleeding and will follow up with PT/INR in the a.m. 5. Will continue to follow renal function, culture results, LOT, and antibiotic de-escalation plans   Georgina Pillion, PharmD, BCPS Clinical Pharmacist Pager: [phone] 06/27/2015 12:35 PM

## 2015-06-27 NOTE — Progress Notes (Signed)
Utilization Review Completed.Charlie Char T3/05/2015  

## 2015-06-27 NOTE — Progress Notes (Signed)
E link made aware of outcomes of NS bolus as well as pts MAP sitting right around 50. Pt is easily aroused from sleeping at this time and able to answer all questions (a&ox4). At this time, MD states to monitor and to call again if she shows any other signs of decompensation.

## 2015-06-28 LAB — CBC WITH DIFFERENTIAL/PLATELET
BASOS ABS: 0 10*3/uL (ref 0.0–0.1)
BASOS PCT: 0 %
EOS ABS: 0 10*3/uL (ref 0.0–0.7)
Eosinophils Relative: 0 %
HCT: 27.4 % — ABNORMAL LOW (ref 36.0–46.0)
HEMOGLOBIN: 8.6 g/dL — AB (ref 12.0–15.0)
Lymphocytes Relative: 5 %
Lymphs Abs: 0.6 10*3/uL — ABNORMAL LOW (ref 0.7–4.0)
MCH: 28.7 pg (ref 26.0–34.0)
MCHC: 31.4 g/dL (ref 30.0–36.0)
MCV: 91.3 fL (ref 78.0–100.0)
Monocytes Absolute: 1 10*3/uL (ref 0.1–1.0)
Monocytes Relative: 8 %
NEUTROS PCT: 87 %
Neutro Abs: 11.3 10*3/uL — ABNORMAL HIGH (ref 1.7–7.7)
Platelets: 115 10*3/uL — ABNORMAL LOW (ref 150–400)
RBC: 3 MIL/uL — AB (ref 3.87–5.11)
RDW: 20.6 % — ABNORMAL HIGH (ref 11.5–15.5)
WBC: 12.9 10*3/uL — AB (ref 4.0–10.5)

## 2015-06-28 LAB — COMPREHENSIVE METABOLIC PANEL
ALBUMIN: 1.9 g/dL — AB (ref 3.5–5.0)
ALK PHOS: 324 U/L — AB (ref 38–126)
ALT: 32 U/L (ref 14–54)
ANION GAP: 10 (ref 5–15)
AST: 36 U/L (ref 15–41)
BUN: 38 mg/dL — ABNORMAL HIGH (ref 6–20)
CALCIUM: 8.2 mg/dL — AB (ref 8.9–10.3)
CO2: 23 mmol/L (ref 22–32)
Chloride: 106 mmol/L (ref 101–111)
Creatinine, Ser: 1.48 mg/dL — ABNORMAL HIGH (ref 0.44–1.00)
GFR calc Af Amer: 37 mL/min — ABNORMAL LOW (ref >60)
GFR calc non Af Amer: 32 mL/min — ABNORMAL LOW (ref >60)
GLUCOSE: 117 mg/dL — AB (ref 65–99)
Potassium: 4.1 mmol/L (ref 3.5–5.1)
SODIUM: 139 mmol/L (ref 135–145)
Total Bilirubin: 0.6 mg/dL (ref 0.3–1.2)
Total Protein: 5.8 g/dL — ABNORMAL LOW (ref 6.5–8.1)

## 2015-06-28 LAB — URINE CULTURE: CULTURE: NO GROWTH

## 2015-06-28 LAB — PROTIME-INR
INR: 5.81 (ref 0.00–1.49)
Prothrombin Time: 50.3 seconds — ABNORMAL HIGH (ref 11.6–15.2)

## 2015-06-28 LAB — MAGNESIUM: Magnesium: 2.1 mg/dL (ref 1.7–2.4)

## 2015-06-28 LAB — LACTIC ACID, PLASMA: Lactic Acid, Venous: 1.4 mmol/L (ref 0.5–2.0)

## 2015-06-28 MED ORDER — OXYCODONE HCL 5 MG PO TABS
5.0000 mg | ORAL_TABLET | ORAL | Status: DC | PRN
  Administered 2015-06-28 – 2015-07-05 (×16): 10 mg via ORAL
  Administered 2015-07-05: 5 mg via ORAL
  Administered 2015-07-05: 10 mg via ORAL
  Administered 2015-07-05: 5 mg via ORAL
  Administered 2015-07-06 – 2015-07-07 (×3): 10 mg via ORAL
  Filled 2015-06-28: qty 1
  Filled 2015-06-28 (×2): qty 2
  Filled 2015-06-28: qty 1
  Filled 2015-06-28 (×19): qty 2

## 2015-06-28 MED ORDER — SODIUM CHLORIDE 0.9 % IV BOLUS (SEPSIS)
500.0000 mL | Freq: Once | INTRAVENOUS | Status: AC
  Administered 2015-06-28: 500 mL via INTRAVENOUS

## 2015-06-28 MED ORDER — MORPHINE SULFATE (PF) 2 MG/ML IV SOLN
1.0000 mg | INTRAVENOUS | Status: DC | PRN
Start: 2015-06-28 — End: 2015-07-07
  Administered 2015-06-29 – 2015-07-03 (×10): 2 mg via INTRAVENOUS
  Filled 2015-06-28 (×11): qty 1

## 2015-06-28 MED ORDER — SODIUM CHLORIDE 0.9 % IV SOLN
INTRAVENOUS | Status: DC
  Administered 2015-06-28 – 2015-06-30 (×2): via INTRAVENOUS

## 2015-06-28 NOTE — Evaluation (Signed)
Physical Therapy Evaluation Patient Details Name: Kristina Diaz MRN: [id number] DOB: 09/29/1929 Today's Date: 06/28/2015   History of Present Illness  82 yo female admitted with fever, persistent LE redness and drainage. Sepsis related to LE cellulitis infection concerns for osteomyselitis. PMH: Atrial Fibrillation, PVD, HF LLE DVT, chronic L LE wound. 05/09/15 fall with hematoma at L SFA access  Clinical Impression  Pt admitted with above diagnosis. Pt currently with functional limitations due to the deficits listed below (see PT Problem List). Pt has extremely kyphotic spine and has multiple wounds on her trunk, bottom, and L LE. Pt required moderate assistance and is a very high fall risk. Pt states she has been requiring quite a bit of assistance with mobility and her husband "does the best he can" Pt will benefit from skilled PT to increase their independence and safety with mobility to allow discharge to the venue listed below.  Pt will benefit from SNF rehab to increase independence with mobility before returning home.   Pt is also at very high risk for further skin break down due to posture and lack of mobility. She would benefit from mattress overlay.      Follow Up Recommendations SNF;Supervision/Assistance - 24 hour    Equipment Recommendations  None recommended by PT    Recommendations for Other Services       Precautions / Restrictions Precautions Precautions: Fall Precaution Comments: multiple wounds on kyphotic spine, sacrum and inner thigh Restrictions Weight Bearing Restrictions: No      Mobility  Bed Mobility Overal bed mobility: Needs Assistance Bed Mobility: Supine to Sit;Rolling Rolling: Mod assist   Supine to sit: Mod assist;HOB elevated     General bed mobility comments: using bed rail and reaching for therapist  Transfers Overall transfer level: Needs assistance Equipment used: 2 person hand held assist Transfers: Sit to/from Stand;Stand Pivot  Transfers Sit to Stand: +2 physical assistance;Mod assist Stand pivot transfers: +2 physical assistance;Mod assist       General transfer comment: pt needs to stand pivot to chair. pt unable to tolerate prolonged static standing or to step due to L LE pain  Ambulation/Gait                Stairs            Wheelchair Mobility    Modified Rankin (Stroke Patients Only)       Balance Overall balance assessment: Needs assistance Sitting-balance support: Bilateral upper extremity supported Sitting balance-Leahy Scale: Fair Sitting balance - Comments: pt able to sit EOB with BUE support and Min Guard Postural control: Posterior lean Standing balance support: Bilateral upper extremity supported Standing balance-Leahy Scale: Poor Standing balance comment: reliant on UE's                              Pertinent Vitals/Pain Pain Assessment: Faces Faces Pain Scale: Hurts whole lot Pain Location: L LE Pain Descriptors / Indicators: Constant;Aching;Grimacing Pain Intervention(s): Monitored during session;Limited activity within patient's tolerance;Repositioned;Patient requesting pain meds-RN notified  HR: ~135 with all mobility. SaO2 100% on room air. BP: 113/76 mmHg    Home Living Family/patient expects to be discharged to:: Assisted living                 Additional Comments: [facility] where patient resides with spouse    Prior Function Level of Independence: Needs assistance   Gait / Transfers Assistance Needed: Pt states she walked with RW  for short distances and w/c for longer distances.   ADL's / Homemaking Assistance Needed: pt unable to perform peri care in supine this sesison an reports spouse (A) as best he can  Comments: lives in a 2 room apt with her husband at [facility]. Mostly uses w/c for mobility. SOmetimes walks short distances with RW in apt. Has caregiver assist with toileting and in/out of bed     Hand Dominance    Dominant Hand: Right    Extremity/Trunk Assessment   Upper Extremity Assessment: Defer to OT evaluation           Lower Extremity Assessment: Generalized weakness      Cervical / Trunk Assessment: Kyphotic (Severe kyphosis)  Communication   Communication: No difficulties  Cognition Arousal/Alertness: Awake/alert Behavior During Therapy: WFL for tasks assessed/performed Overall Cognitive Status: Within Functional Limits for tasks assessed                      General Comments General comments (skin integrity, edema, etc.): Pt has multiple wounds on her trunk, bottom, and L LE.     Exercises        Assessment/Plan    PT Assessment Patient needs continued PT services  PT Diagnosis Difficulty walking;Abnormality of gait;Generalized weakness;Acute pain   PT Problem List Decreased strength;Decreased range of motion;Decreased activity tolerance;Decreased balance;Decreased mobility;Decreased safety awareness;Decreased skin integrity;Pain  PT Treatment Interventions Gait training;Functional mobility training;Therapeutic activities;Therapeutic exercise;Balance training;Patient/family education   PT Goals (Current goals can be found in the Care Plan section) Acute Rehab PT Goals Patient Stated Goal: to go back to bed and not have to wait PT Goal Formulation: With patient Time For Goal Achievement: 07/12/15 Potential to Achieve Goals: Fair    Frequency Min 2X/week   Barriers to discharge Decreased caregiver support will need assist with all mobility.     Co-evaluation   Reason for Co-Treatment: Complexity of the patient's impairments (multi-system involvement)   OT goals addressed during session: ADL's and self-care;Strengthening/ROM       End of Session Equipment Utilized During Treatment: Gait belt Activity Tolerance: Patient limited by fatigue;Patient limited by pain Patient left: in chair;with call bell/phone within reach;with chair alarm set Nurse  Communication: Mobility status;Patient requests pain meds         Time: 1610-9604 PT Time Calculation (min) (ACUTE ONLY): 20 min   Charges:   PT Evaluation $PT Eval Moderate Complexity: 1 Procedure     PT G Codes:        Everlean Cherry, SPT Everlean Cherry 06/28/2015, 3:09 PM

## 2015-06-28 NOTE — Plan of Care (Signed)
Problem: Acute Rehab PT Goals(only PT should resolve) Goal: Pt Will Transfer Bed To Chair/Chair To Bed With RW     

## 2015-06-28 NOTE — Care Management Important Message (Signed)
Important Message  Patient Details  Name: Kristina Diaz MRN: [id number] Date of Birth: 09/26/1929   Medicare Important Message Given:  Yes    Annjanette Wertenberger Abena 06/28/2015, 1:11 PM

## 2015-06-28 NOTE — Progress Notes (Signed)
ANTICOAGULATION CONSULT NOTE  Pharmacy Consult for Coumadin Indication: AFib and Hx DVT  Allergies  Allergen Reactions  . Bactrim Ds [Sulfamethoxazole-Trimethoprim] Rash  . Contrast Media [Iodinated Diagnostic Agents] Hives    BLE erythema   . Ciprofloxacin Rash  . Tape Rash    Patient Measurements: Height: 5\' 4"  (162.6 cm) Weight: 134 lb 0.6 oz (60.8 kg) IBW/kg (Calculated) : 54.7   Vital Signs: Temp: 97.4 F (36.3 C) (03/03 1600) Temp Source: Oral (03/03 1600) BP: 112/64 mmHg (03/03 1600) Pulse Rate: 109 (03/03 1600)  Labs:  Recent Labs  06/26/15 1202 06/26/15 1834 06/26/15 2048 06/27/15 0430 06/27/15 0847 06/28/15 0345  HGB 9.0*  --   --  8.3*  --  8.6*  HCT 28.7*  --   --  25.7*  --  27.4*  PLT 149*  --   --  111*  --  115*  APTT  --  57*  --   --   --   --   LABPROT  --  46.6*  --  50.5*  --  50.3*  INR  --  5.31*  --  5.84*  --  5.81*  CREATININE 2.50*  --  2.05*  --  1.76* 1.48*    Estimated Creatinine Clearance: 24.9 mL/min (by C-G formula based on Cr of 1.48).   Medical History: Past Medical History  Diagnosis Date  . PAD (peripheral artery disease) (HCC) 2013    left leg. sounds like she had angio-plasty   . Atrial fibrillation (HCC) 2013    [facility] medication  . Hypertension   . Osteoporosis   . DVT (deep venous thrombosis) (HCC) 2013    "LLE" (08/04/2012)  . Hypothyroidism   . History of blood transfusion 2013    "related to thrombectomy" (08/04/2012)  . Anemia     "slightly" (08/04/2012)  . Arthritis     "probably minor; knees, pinky" (08/04/2012)  . Fracture of L4 vertebra (HCC)     "dx'd in 2013; can't treat til legs fixed" (08/04/2012)  . Lower extremity ulceration (HCC)   . Biventricular heart failure with reduced left ventricular function (HCC)      Assessment: Warfarin PTA for AF and Hx of DVT. Admitted recently and discharged on warfarin with lovenox to bridge.  INR 5.81. Will hold coumadin   PTA dose = 4.5  mg daily except 3 mg on Tuesdays/Fridays per clinic note. However, dose per nursing home administration guide is 4.5mg  daily on Su, M, Wed, Th, Sat.  Goal of Therapy:  INR 2-3 Monitor platelets by anticoagulation protocol: Yes   Plan:  Hold coumadin tonight Daily INR/CBC Monitor for s/s of bleeding   Thank you for allowing us to participate in this patients care. Signe Coltonya C Elaria Osias, PharmD Pager: [phone] 06/28/2015 6:41 PM

## 2015-06-28 NOTE — Evaluation (Signed)
Occupational Therapy Evaluation Patient Details Name: Kristina Diaz MRN: [id number] DOB: 12-30-1929 Today's Date: 06/28/2015    History of Present Illness 82 yo female admitted with fever, persistent LE redness and drainage. Sepsis related to LE cellulitis infection concerns for osteomyselitis. PMH: Atrial Fibrillation, PVD, HF LLE DVT, chronic L LE wound. 05/09/15 fall with hematoma at L SFA access   Clinical Impression   PT admitted with fever, sepsis and concern for L LE osteomyelitis. Pt currently with functional limitiations due to the deficits listed below (see OT problem list). PTA living at ALF with spouse with multiple wounds.  Pt will benefit from skilled OT to increase their independence and safety with adls and balance to allow discharge SNF. Pt with multiple wounds, inability to get OOB with RN to 3n1 requiring bed pan and redness on buttock from prolonged positioning supine. Pt is at further risk for skin break down and falls at this time.  RECOMMEND AIR MATTRESS OVERLAY due to skin break down and risk for further break down on kyphotic spine.      Follow Up Recommendations  SNF;Supervision/Assistance - 24 hour    Equipment Recommendations  3 in 1 bedside comode;Wheelchair (measurements OT);Wheelchair cushion (measurements OT);Hospital bed    Recommendations for Other Services       Precautions / Restrictions Precautions Precautions: Fall Precaution Comments: multiple wounds on kyphotic spine, sacrum and inner thigh      Mobility Bed Mobility Overal bed mobility: Needs Assistance Bed Mobility: Supine to Sit     Supine to sit: Mod assist;HOB elevated     General bed mobility comments: using bed rail and reaching for therapist  Transfers Overall transfer level: Needs assistance Equipment used: 2 person hand held assist Transfers: Sit to/from Stand;Stand Pivot Transfers Sit to Stand: +2 physical assistance;Mod assist Stand pivot transfers: +2 physical  assistance;Mod assist       General transfer comment: pt needs to stand pivot to chair. pt unable to tolerate prolonged static standing or to step due to L LE pain    Balance                                            ADL Overall ADL's : Needs assistance/impaired Eating/Feeding: Minimal assistance;Sitting Eating/Feeding Details (indicate cue type and reason): required (A) opening packets                        Toilet Transfer Details (indicate cue type and reason): bed level bed pan - pt upset stating she needs hygiene Toileting- Clothing Manipulation and Hygiene: Total assistance         General ADL Comments: Pt noted to have wounds on back and peri area. RN present to apply dressing to wounds. pt applied powder to lateral aspects to thighs to keep from skin break down     Vision     Perception     Praxis      Pertinent Vitals/Pain Pain Assessment: Faces Faces Pain Scale: Hurts whole lot Pain Location: peri hygiene and L LE after up in chair Pain Descriptors / Indicators: Constant;Discomfort;Grimacing Pain Intervention(s): Monitored during session;Repositioned;Patient requesting pain meds-RN notified     Hand Dominance Right   Extremity/Trunk Assessment Upper Extremity Assessment Upper Extremity Assessment: Generalized weakness       Cervical / Trunk Assessment Cervical / Trunk Assessment: Kyphotic   Communication Communication  Communication: No difficulties   Cognition Arousal/Alertness: Awake/alert Behavior During Therapy: WFL for tasks assessed/performed Overall Cognitive Status: Within Functional Limits for tasks assessed                     General Comments       Exercises       Shoulder Instructions      Home Living Family/patient expects to be discharged to:: Skilled nursing facility                                 Additional [facility] spouse       Prior Functioning/Environment Level of Independence: Needs assistance    ADL's / Homemaking Assistance Needed: pt unable to perform peri care in supine this sesison an reports spouse (A) as best he can        OT Diagnosis: Generalized weakness;Acute pain   OT Problem List: Decreased strength;Decreased activity tolerance;Impaired balance (sitting and/or standing);Decreased safety awareness;Decreased knowledge of use of DME or AE;Cardiopulmonary status limiting activity;Decreased knowledge of precautions;Pain   OT Treatment/Interventions: Self-care/ADL training;Patient/family education;Balance training;Therapeutic activities;Therapeutic exercise;DME and/or AE instruction;Energy conservation    OT Goals(Current goals can be found in the care plan section) Acute Rehab OT Goals Patient Stated Goal: to go back to bed and not have to wait OT Goal Formulation: With patient Time For Goal Achievement: 07/12/15 Potential to Achieve Goals: Good  OT Frequency: Min 2X/week   Barriers to D/C: Decreased caregiver support  ALF but demonstrates skin break down all over UB and LB       Co-evaluation PT/OT/SLP Co-Evaluation/Treatment: Yes Reason for Co-Treatment: Complexity of the patient's impairments (multi-system involvement);For patient/therapist safety   OT goals addressed during session: ADL's and self-care;Strengthening/ROM      End of Session Equipment Utilized During Treatment: Gait belt Nurse Communication: Mobility status;Precautions  Activity Tolerance: Patient limited by pain Patient left: in chair;with call bell/phone within reach;with chair alarm set;with nursing/sitter in room   Time: 1407-1430 OT Time Calculation (min): 23 min Charges:  OT General Charges $OT Visit: 1 Procedure OT Evaluation $OT Eval Moderate Complexity: 1 Procedure G-Codes:    Kristina Diaz 06/28/2015, 2:53 PM  Kristina Diaz   OTR/L Pager: [phone]: [phone] Office: [phone] .

## 2015-06-28 NOTE — Progress Notes (Signed)
[facility] TEAM 1 - Stepdown/ICU TEAM PROGRESS NOTE  Kristina Diaz ZOX:[id number] DOB: 04/06/1930 DOA: 06/26/2015 PCP: Rogelia Diaz,Kristina FRANK, MD  Admit HPI / Brief Narrative: 82 y.o. F Hxatrial fibrillation, PVD, systolic heart failure (EF 45 to 50%), LLE DVT, and chronic left leg wound who presented with fever.  The patient has a history of nonhealing ulcers of the left lower extremity and underwent left popliteal and tibial revascularization with PTA of the popliteal, anterior tibial, and peroneal arteries by Dr. Loreta AveWagner w/ IR on 05/09/2015. Postprocedure, she had a fall and was found to have a hematoma at the left SFA access which resulted in an drop in her hemoglobin. She was admitted at that time and received blood transfusions. She was also admitted 2-13 for fever and infection LE, worsening drainage from wound. She was treated with IV antibiotics and discharged to a skilled nursing facility. She had a Venogram done 2-12 with patent vessels.  She returned to the hospital on 06/26/15 with fevers, persistent LE redness, and drainage. Evaluation revealed SBP in the 80s, and lactic acid 2.6. X ray LE noted new and increased periosteal reaction involving distal left Femur. WBC 19. Cr 2.5  HPI/Subjective: The pt is sitting in a bedside chair.  She c/o uncontrolled pain in her R LE.  She denies cp, n/v, or abdom pain.  She is anxious to get back into bed.    Assessment/Plan:  Severe Sepsis w/ hypotension due to LE cellulitis infection, concern for osteomyelitis -cont broad empiric abx coverage - cont to hydrate - baseline SBP reportedly 100-110  LE cellulitis chronic wound infection.  -left popliteal and tibial revascularization with PTA of the popliteal, anterior tibial, and peroneal arteries by IR 05/09/2015 -Venogram 06/09/15 with patent vessels -Left tib-fib CT negative for abscess -L leg plain film concerning for distal L femur osteo v/s findings related to PVD  Acute Renal failure -due to  sepsis / hypotension - crt improving   A fib continue coumadin - rate reasonably well controlled   Chronic Systolic CHF / Dilated Cardiomyopathy -no evidence of signif volume overload at this time   Hypothyroidism  Supratherapeutic INR -resume warfarin when INR closer to goal   Hypokalemia -replaced   Hypomagnesemia -replaced   Code Status: DNR Family Communication: no family present at time of exam Disposition Plan: SDU - back to SNF eventually   Consultants: none  Procedures: none  Antibiotics: Zosyn 3/1 > Vancomycin 3/1 > 3/3  DVT prophylaxis: Warfarin   Objective: Blood pressure 112/64, pulse 109, temperature 97.4 F (36.3 C), temperature source Oral, resp. rate 20, height 5\' 4"  (1.626 m), weight 60.8 kg (134 lb 0.6 oz), SpO2 96 %.  Intake/Output Summary (Last 24 hours) at 06/28/15 1812 Last data filed at 06/28/15 1500  Gross per 24 hour  Intake    730 ml  Output    500 ml  Net    230 ml   Exam: General: No acute respiratory distress Lungs: Clear to auscultation bilaterally without wheezes or crackles Cardiovascular: Regular rate and without murmur gallop or rub normal S1 and S2 Abdomen: Nontender, nondistended, soft, bowel sounds positive, no rebound, no ascites, no appreciable mass Extremities: R LE dressed w/ serous type drainage staining anterior dressing - trace edema B LE - no cyanosis of feet B  Data Reviewed:  Basic Metabolic Panel:  Recent Labs Lab 06/26/15 1202 06/26/15 2048 06/27/15 0847 06/28/15 0345  NA 136 140 141 139  K 3.7 3.4* 3.4* 4.1  CL 98* 105  106 106  CO2 GLUCOSE 109* 109* 101* 117*  BUN 47* 42* 37* 38*  CREATININE 2.50* 2.05* 1.76* 1.48*  CALCIUM 8.4* 7.7* 7.9* 8.2*  MG  --   --  1.8 2.1    CBC:  Recent Labs Lab 06/26/15 1202 06/27/15 0430 06/28/15 0345  WBC 19.9* 13.2* 12.9*  NEUTROABS 17.5*  --  11.3*  HGB 9.0* 8.3* 8.6*  HCT 28.7* 25.7* 27.4*  MCV 92.9 92.4 91.3  PLT 149* 111* 115*     Liver Function Tests:  Recent Labs Lab 06/26/15 1202 06/27/15 0847 06/28/15 0345  AST 72* 50* 36  ALT 43 37 32  ALKPHOS 480* 387* 324*  BILITOT 1.0 0.9 0.6  PROT 6.5 5.6* 5.8*  ALBUMIN 2.3* 2.0* 1.9*   Coags:  Recent Labs Lab 06/25/15 06/26/15 1834 06/27/15 0430 06/28/15 0345  INR 2.83 5.31* 5.84* 5.81*    Recent Labs Lab 06/26/15 1834  APTT 57*    Recent Results (from the past 240 hour(s))  Blood Culture (routine x 2)     Status: None (Preliminary result)   Collection Time: 06/26/15 11:50 AM  Result Value Ref Range Status   Specimen Description BLOOD RIGHT FOREARM  Final   Special Requests BOTTLES DRAWN AEROBIC AND ANAEROBIC  Final   Culture NO GROWTH 2 DAYS  Final   Report Status PENDING  Incomplete  Blood Culture (routine x 2)     Status: None (Preliminary result)   Collection Time: 06/26/15 12:10 PM  Result Value Ref Range Status   Specimen Description BLOOD LEFT HAND  Final   Special Requests BOTTLES DRAWN AEROBIC ONLY 5CCS  Final   Culture NO GROWTH 2 DAYS  Final   Report Status PENDING  Incomplete  Urine culture     Status: None   Collection Time: 06/26/15  7:56 PM  Result Value Ref Range Status   Specimen Description URINE, RANDOM  Final   Special Requests NONE  Final   Culture NO GROWTH 2 DAYS  Final   Report Status 06/28/2015 FINAL  Final  MRSA PCR Screening     Status: None   Collection Time: 06/26/15  9:49 PM  Result Value Ref Range Status   MRSA by PCR NEGATIVE NEGATIVE Final    Comment:        The GeneXpert MRSA Assay (FDA approved for NASAL specimens only), is one component of a comprehensive MRSA colonization surveillance program. It is not intended to diagnose MRSA infection nor to guide or monitor treatment for MRSA infections.      Studies:   Recent x-ray studies have been reviewed in detail by the Attending Physician  Scheduled Meds:  Scheduled Meds: . aspirin EC  81 mg Oral Daily  . collagenase   Topical  Daily  . feeding supplement (ENSURE ENLIVE)  237 mL Oral BID BM  . feeding supplement (PRO-STAT SUGAR FREE 64)  30 mL Oral BID  . levothyroxine  37.5 mcg Oral QAC breakfast  . multivitamin with minerals  1 tablet Oral q morning - 10a  . piperacillin-tazobactam (ZOSYN)  IV  3.375 g Intravenous 3 times per day  . vancomycin  500 mg Intravenous Q24H    Time spent on care of this patient: 35 mins   Joenathan Sakuma T , MD   [facility] Office  [phone] Pager - Text Page per Loretha Stapler as per below:  On-Call/Text Page:      [email or web address]      password [email or web address]  If 7PM-7AM, please contact night-coverage [email or web address] Password [email or web address] 06/28/2015, 6:12 PM   LOS: 2 days

## 2015-06-29 DIAGNOSIS — S81802D Unspecified open wound, left lower leg, subsequent encounter: Secondary | ICD-10-CM

## 2015-06-29 LAB — COMPREHENSIVE METABOLIC PANEL
ALT: 28 U/L (ref 14–54)
AST: 30 U/L (ref 15–41)
Albumin: 1.8 g/dL — ABNORMAL LOW (ref 3.5–5.0)
Alkaline Phosphatase: 285 U/L — ABNORMAL HIGH (ref 38–126)
Anion gap: 8 (ref 5–15)
BUN: 29 mg/dL — AB (ref 6–20)
CHLORIDE: 108 mmol/L (ref 101–111)
CO2: 25 mmol/L (ref 22–32)
Calcium: 8.5 mg/dL — ABNORMAL LOW (ref 8.9–10.3)
Creatinine, Ser: 1.22 mg/dL — ABNORMAL HIGH (ref 0.44–1.00)
GFR calc Af Amer: 46 mL/min — ABNORMAL LOW (ref >60)
GFR, EST NON AFRICAN AMERICAN: 40 mL/min — AB (ref >60)
Glucose, Bld: 83 mg/dL (ref 65–99)
Potassium: 4.3 mmol/L (ref 3.5–5.1)
SODIUM: 141 mmol/L (ref 135–145)
Total Bilirubin: 0.6 mg/dL (ref 0.3–1.2)
Total Protein: 5.6 g/dL — ABNORMAL LOW (ref 6.5–8.1)

## 2015-06-29 LAB — CBC
HCT: 29.7 % — ABNORMAL LOW (ref 36.0–46.0)
HEMOGLOBIN: 9.3 g/dL — AB (ref 12.0–15.0)
MCH: 29 pg (ref 26.0–34.0)
MCHC: 31.3 g/dL (ref 30.0–36.0)
MCV: 92.5 fL (ref 78.0–100.0)
PLATELETS: 127 10*3/uL — AB (ref 150–400)
RBC: 3.21 MIL/uL — AB (ref 3.87–5.11)
RDW: 20.9 % — ABNORMAL HIGH (ref 11.5–15.5)
WBC: 7.5 10*3/uL (ref 4.0–10.5)

## 2015-06-29 LAB — PROTIME-INR
INR: 4.36 — ABNORMAL HIGH (ref 0.00–1.49)
Prothrombin Time: 40.6 seconds — ABNORMAL HIGH (ref 11.6–15.2)

## 2015-06-29 MED ORDER — METOPROLOL TARTRATE 12.5 MG HALF TABLET
12.5000 mg | ORAL_TABLET | Freq: Two times a day (BID) | ORAL | Status: DC
  Administered 2015-06-29 – 2015-07-07 (×15): 12.5 mg via ORAL
  Filled 2015-06-29 (×16): qty 1

## 2015-06-29 MED ORDER — WARFARIN - PHARMACIST DOSING INPATIENT
Freq: Every day | Status: DC
  Administered 2015-07-02 – 2015-07-05 (×3)

## 2015-06-29 NOTE — Progress Notes (Signed)
Kristina Diaz TEAM 1 - Stepdown/ICU TEAM PROGRESS NOTE  Kristina Diaz ZOX:[id number] DOB: 1930/01/22 DOA: 06/26/2015 PCP: [name], MD  Admit HPI / Brief Narrative: 82 y.o. F Hxatrial fibrillation, PVD, systolic heart failure (EF 45 to 50%), LLE DVT, and chronic left leg wound who presented with fever.  The patient has a history of nonhealing ulcers of the left lower extremity and underwent left popliteal and tibial revascularization with PTA of the popliteal, anterior tibial, and peroneal arteries by Dr. Loreta Ave w/ IR on 05/09/2015. Postprocedure, she had a fall and was found to have a hematoma at the left SFA access which resulted in an drop in her hemoglobin. She was admitted at that time and received blood transfusions. She was also admitted 2/13 for fever and infection LE, worsening drainage from wound. She was treated with IV antibiotics and underwent an unremarkable venogram, with ultimate discharge to a skilled nursing facility. She returned to the hospital on 06/26/15 with fevers, persistent LE redness, and drainage. Evaluation revealed SBP in the 80s, and lactic acid 2.6. Xray LE noted new and increased periosteal reaction involving distal left Femur. WBC 19. Cr 2.5  HPI/Subjective: The patient is resting comfortably in bed.  She is alert and oriented.  She reports the usual amount of pain in her left lower shin May.  She denies shortness of breath chest pain nausea or vomiting.  Assessment/Plan:  Severe Sepsis w/ hypotension due to LE cellulitis infection, concern for osteomyelitis -cont broad empiric abx coverage - appears hemodynamically stable at present - baseline SBP reportedly 100-110  LE cellulitis chronic wound infection - PAD -left popliteal and tibial revascularization with PTA of the popliteal, anterior tibial, and peroneal arteries by IR 05/09/2015 -Venogram 06/09/15 with patent vessels / no stricture  -Left tib-fib CT negative for abscess -L leg plain film concerning  for distal L femur osteo v/s findings related to PVD -cont abx tx for now as pt appears to be improving - consider MRI of leg to definitively eval for possible osteo, but nontheless the only apparent tx option beyond conservative abx tx at this time appears to be amputation   Acute Renal failure due to sepsis / hypotension - crt improving steadily   A fib continue coumadin - rate control variable - follow on tele - INR drifting back down toward therapeutic range slowly    Chronic Systolic CHF / Dilated Cardiomyopathy -no evidence of signif volume overload at this time - +~5L since admit - baseline weight of late appears to be ~67kg - currently 60.8kg   Hypothyroidism Cont synthroid   Supratherapeutic INR resume warfarin when INR closer to goal - Pharmacy to follow   Hypokalemia replaced   Hypomagnesemia replaced   Code Status: DNR Family Communication: no family present at time of exam Disposition Plan: SDU - back to SNF eventually   Consultants: none  Procedures: none  Antibiotics: Zosyn 3/1 > Vancomycin 3/1 > 3/3  DVT prophylaxis: Warfarin   Objective: Blood pressure 102/64, pulse 95, temperature 96.1 F (35.6 C), temperature source Axillary, resp. rate 12, height  (1.626 m), weight 60.8 kg (134 lb 0.6 oz), SpO2 99 %.  Intake/Output Summary (Last 24 hours) at 06/29/15 1024 Last data filed at 06/28/15 1500  Gross per 24 hour  Intake    370 ml  Output      0 ml  Net    370 ml   Exam: General: No acute respiratory distress - alert and conversant  Lungs: Clear to auscultation  bilaterally without wheezes  Cardiovascular: Regular rate- irreg rythm  Abdomen: Nontender, nondistended, soft, bowel sounds positive, no rebound, no ascites, no appreciable mass Extremities: L LE dressed and dry - trace edema B LE - no cyanosis of feet B  Data Reviewed:  Basic Metabolic Panel:  Recent Labs Lab 06/26/15 1202 06/26/15 2048 06/27/15 0847 06/28/15 0345  06/29/15 0230  NA 136 140 141 139 141  K 3.7 3.4* 3.4* 4.1 4.3  CL 98* 105 106 106 108  CO2 GLUCOSE 109* 109* 101* 117* 83  BUN 47* 42* 37* 38* 29*  CREATININE 2.50* 2.05* 1.76* 1.48* 1.22*  CALCIUM 8.4* 7.7* 7.9* 8.2* 8.5*  MG  --   --  1.8 2.1  --     CBC:  Recent Labs Lab 06/26/15 1202 06/27/15 0430 06/28/15 0345 06/29/15 0230  WBC 19.9* 13.2* 12.9* 7.5  NEUTROABS 17.5*  --  11.3*  --   HGB 9.0* 8.3* 8.6* 9.3*  HCT 28.7* 25.7* 27.4* 29.7*  MCV 92.9 92.4 91.3 92.5  PLT 149* 111* 115* 127*    Liver Function Tests:  Recent Labs Lab 06/26/15 1202 06/27/15 0847 06/28/15 0345 06/29/15 0230  AST 72* 50* 36 30  ALT 43 37 32 28  ALKPHOS 480* 387* 324* 285*  BILITOT 1.0 0.9 0.6 0.6  PROT 6.5 5.6* 5.8* 5.6*  ALBUMIN 2.3* 2.0* 1.9* 1.8*   Coags:  Recent Labs Lab 06/25/15 06/26/15 1834 06/27/15 0430 06/28/15 0345 06/29/15 0230  INR 2.83 5.31* 5.84* 5.81* 4.36*    Recent Labs Lab 06/26/15 1834  APTT 57*    Recent Results (from the past 240 hour(s))  Blood Culture (routine x 2)     Status: None (Preliminary result)   Collection Time: 06/26/15 11:50 AM  Result Value Ref Range Status   Specimen Description BLOOD RIGHT FOREARM  Final   Special Requests BOTTLES DRAWN AEROBIC AND ANAEROBIC  Final   Culture NO GROWTH 2 DAYS  Final   Report Status PENDING  Incomplete  Blood Culture (routine x 2)     Status: None (Preliminary result)   Collection Time: 06/26/15 12:10 PM  Result Value Ref Range Status   Specimen Description BLOOD LEFT HAND  Final   Special Requests BOTTLES DRAWN AEROBIC ONLY 5CCS  Final   Culture NO GROWTH 2 DAYS  Final   Report Status PENDING  Incomplete  Urine culture     Status: None   Collection Time: 06/26/15  7:56 PM  Result Value Ref Range Status   Specimen Description URINE, RANDOM  Final   Special Requests NONE  Final   Culture NO GROWTH 2 DAYS  Final   Report Status 06/28/2015 FINAL  Final  MRSA PCR  Screening     Status: None   Collection Time: 06/26/15  9:49 PM  Result Value Ref Range Status   MRSA by PCR NEGATIVE NEGATIVE Final    Comment:        The GeneXpert MRSA Assay (FDA approved for NASAL specimens only), is one component of a comprehensive MRSA colonization surveillance program. It is not intended to diagnose MRSA infection nor to guide or monitor treatment for MRSA infections.      Studies:   Recent x-ray studies have been reviewed in detail by the Attending Physician  Scheduled Meds:  Scheduled Meds: . aspirin EC  81 mg Oral Daily  . collagenase   Topical Daily  . feeding supplement (ENSURE ENLIVE)  237 mL Oral  BID BM  . feeding supplement (PRO-STAT SUGAR FREE 64)  30 mL Oral BID  . levothyroxine  37.5 mcg Oral QAC breakfast  . multivitamin with minerals  1 tablet Oral q morning - 10a  . piperacillin-tazobactam (ZOSYN)  IV  3.375 g Intravenous 3 times per day    Time spent on care of this patient: 35 mins   Jamielee Mchale T , MD   [facility] Office  [phone] Pager - Text Page per Loretha StaplerAmion as per below:  On-Call/Text Page:      [email or web address]      password [email or web address]  If 7PM-7AM, please contact night-coverage [email or web address] Password [email or web address] 06/29/2015, 10:24 AM   LOS: 3 days

## 2015-06-29 NOTE — Progress Notes (Signed)
ANTICOAGULATION CONSULT NOTE - Follow Up Consult  Pharmacy Consult for warfarin Indication: atrial fibrillation and hx DVT  Allergies  Allergen Reactions  . Bactrim Ds [Sulfamethoxazole-Trimethoprim] Rash  . Contrast Media [Iodinated Diagnostic Agents] Hives    BLE erythema   . Ciprofloxacin Rash  . Tape Rash    Patient Measurements: Height: 5\' 4"  (162.6 cm) Weight: 134 lb 0.6 oz (60.8 kg) IBW/kg (Calculated) : 54.7  Vital Signs: Temp: 96.1 F (35.6 C) (03/04 0800) Temp Source: Axillary (03/04 0800) BP: 102/64 mmHg (03/04 0800) Pulse Rate: 95 (03/04 0800)  Labs:  Recent Labs  06/26/15 1834  06/27/15 0430 06/27/15 0847 06/28/15 0345 06/29/15 0230  HGB  --   --  8.3*  --  8.6* 9.3*  HCT  --   --  25.7*  --  27.4* 29.7*  PLT  --   --  111*  --  115* 127*  APTT 57*  --   --   --   --   --   LABPROT 46.6*  --  50.5*  --  50.3* 40.6*  INR 5.31*  --  5.84*  --  5.81* 4.36*  CREATININE  --   < >  --  1.76* 1.48* 1.22*  < > = values in this interval not displayed.  Estimated Creatinine Clearance: 30.2 mL/min (by C-G formula based on Cr of 1.22).   Assessment: 82 y/o female on chronic warfarin for Afib/hx DVTadmitted with sepsis from LLE cellulitis. Pharmacy consulted to manage warfarin.  PTA dose of 4.5 mg daily EXCEPT for 3 mg on Tues/Fri per clinic note  INR remains supratherapeutic at 4.36 but is trending down. Will resume as gets closer to 3. No bleeding noted, Hb stable, platelets trending up.  Goal of Therapy:  INR 2-3 Monitor platelets by anticoagulation protocol: Yes   Plan:  - No warfarin tonight - Daily INR  [facility] Juan HospitalJennifer Dailey, 17[address] Rainbow BoulevardPharm.D., BCPS Clinical Pharmacist Pager: [phone] 06/29/2015 11:32 AM

## 2015-06-29 NOTE — Plan of Care (Signed)
Problem: Fluid Volume: Goal: Hemodynamic stability will improve Patient's goal BP is to remain above 95 systolically; IV fluids ordered and infusing  Problem: Physical Regulation: Goal: Diagnostic test results will improve Lactic acid WNL as of 06/28/15 0432 Goal: Signs and symptoms of infection will decrease Antibiotic: zosyn ordered; WBC WNL as of 06/29/15 0346  Problem: Respiratory: Goal: Ability to maintain adequate ventilation will improve Patient's respirations are clear, even and unlabored. No acute distress noted. Spontaneous respirations; able to manage and clear secretions

## 2015-06-30 LAB — BASIC METABOLIC PANEL
ANION GAP: 8 (ref 5–15)
BUN: 20 mg/dL (ref 6–20)
CALCIUM: 8.6 mg/dL — AB (ref 8.9–10.3)
CO2: 25 mmol/L (ref 22–32)
Chloride: 108 mmol/L (ref 101–111)
Creatinine, Ser: 1.01 mg/dL — ABNORMAL HIGH (ref 0.44–1.00)
GFR, EST AFRICAN AMERICAN: 58 mL/min — AB (ref >60)
GFR, EST NON AFRICAN AMERICAN: 50 mL/min — AB (ref >60)
GLUCOSE: 80 mg/dL (ref 65–99)
POTASSIUM: 4.5 mmol/L (ref 3.5–5.1)
Sodium: 141 mmol/L (ref 135–145)

## 2015-06-30 LAB — CBC
HCT: 30.6 % — ABNORMAL LOW (ref 36.0–46.0)
Hemoglobin: 9.4 g/dL — ABNORMAL LOW (ref 12.0–15.0)
MCH: 28.7 pg (ref 26.0–34.0)
MCHC: 30.7 g/dL (ref 30.0–36.0)
MCV: 93.3 fL (ref 78.0–100.0)
Platelets: 133 10*3/uL — ABNORMAL LOW (ref 150–400)
RBC: 3.28 MIL/uL — ABNORMAL LOW (ref 3.87–5.11)
RDW: 21.3 % — ABNORMAL HIGH (ref 11.5–15.5)
WBC: 6.5 10*3/uL (ref 4.0–10.5)

## 2015-06-30 LAB — PROTIME-INR
INR: 3.16 — ABNORMAL HIGH (ref 0.00–1.49)
Prothrombin Time: 31.8 s — ABNORMAL HIGH (ref 11.6–15.2)

## 2015-06-30 MED ORDER — WARFARIN SODIUM 3 MG PO TABS
3.0000 mg | ORAL_TABLET | Freq: Once | ORAL | Status: AC
  Administered 2015-06-30: 3 mg via ORAL
  Filled 2015-06-30 (×2): qty 1

## 2015-06-30 NOTE — Progress Notes (Signed)
ANTICOAGULATION CONSULT NOTE - Follow Up Consult  Pharmacy Consult for warfarin Indication: atrial fibrillation and hx DVT  Allergies  Allergen Reactions  . Bactrim Ds [Sulfamethoxazole-Trimethoprim] Rash  . Contrast Media [Iodinated Diagnostic Agents] Hives    BLE erythema   . Ciprofloxacin Rash  . Tape Rash    Patient Measurements: Height: 5\' 4"  (162.6 cm) Weight: 134 lb 0.6 oz (60.8 kg) IBW/kg (Calculated) : 54.7  Vital Signs: Temp: 97.5 F (36.4 C) (03/05 0821) Temp Source: Oral (03/05 0821) BP: 101/63 mmHg (03/05 0821) Pulse Rate: 110 (03/05 0821)  Labs:  Recent Labs  06/28/15 0345 06/29/15 0230 06/30/15 0250  HGB 8.6* 9.3* 9.4*  HCT 27.4* 29.7* 30.6*  PLT 115* 127* 133*  LABPROT 50.3* 40.6* 31.8*  INR 5.81* 4.36* 3.16*  CREATININE 1.48* 1.22* 1.01*    Estimated Creatinine Clearance: 36.4 mL/min (by C-G formula based on Cr of 1.01).   Assessment: 82 y/o female on chronic warfarin for Afib/hx DVTadmitted with sepsis from LLE cellulitis. Pharmacy consulted to manage warfarin.  PTA dose of 4.5 mg daily EXCEPT for 3 mg on Tues/Fri per clinic note  INR trending down, down to 3.16 today, will resume warfarin today.  Goal of Therapy:  INR 2-3 Monitor platelets by anticoagulation protocol: Yes   Plan:  - Warfarin 3 mg po x1 - Daily INR    Agapito GamesAlison Amayra Kiedrowski, PharmD, BCPS Clinical Pharmacist Pager: [phone] 06/30/2015 11:46 AM

## 2015-06-30 NOTE — Progress Notes (Signed)
[place] TEAM 1 - Stepdown/ICU TEAM PROGRESS NOTE  Kristina Diaz NFA:[id number] DOB: 01/25/1930 DOA: 06/26/2015 PCP: [name],PETER FRANK, MD  Admit HPI / Brief Narrative: 82 y.o. F Hxatrial fibrillation, PVD, systolic heart failure (EF 45 to 50%), LLE DVT, and chronic left leg wound who presented with fever.  The patient has a history of nonhealing ulcers of the left lower extremity and underwent left popliteal and tibial revascularization with PTA of the popliteal, anterior tibial, and peroneal arteries by Dr. Loreta AveWagner w/ IR on 05/09/2015. Postprocedure, she had a fall and was found to have a hematoma at the left SFA access which resulted in an drop in her hemoglobin. She was admitted at that time and received blood transfusions. She was also admitted 2/13 for fever and infection LE, worsening drainage from wound. She was treated with IV antibiotics and underwent an unremarkable venogram, with ultimate discharge to a skilled nursing facility. She returned to the hospital on 06/26/15 with fevers, persistent LE redness, and drainage. Evaluation revealed SBP in the 80s, and lactic acid 2.6. Xray LE noted new and increased periosteal reaction involving distal left Femur. WBC 19. Cr 2.5  HPI/Subjective: The pt is resting comfortably in bed.  She says she is feeling good today.  She denies leg pain, sob, n/v, or abdom pain.   Assessment/Plan:  Severe Sepsis w/ hypotension due to LE cellulitis infection, concern for osteomyelitis -cont broad empiric abx coverage - appears hemodynamically stable at present - baseline SBP reportedly 100-110 - sepsis physiology has resolved   LE cellulitis chronic wound infection - PAD -left popliteal and tibial revascularization with PTA of the popliteal, anterior tibial, and peroneal arteries by IR 05/09/2015 -Venogram 06/09/15 with patent vessels / no stricture  -Left tib-fib CT negative for abscess -L leg plain film concerning for distal L femur osteo v/s findings  related to PVD -cont abx tx for now as pt appears to be improving - consider MRI of leg to definitively eval for possible osteo, but nontheless the only apparent tx option beyond conservative abx tx at this time appears to be amputation - discussed this w/ the pt, who would like to give abx a try at cure or at least suppression prior to considering evaluation for an amputation   Acute Renal failure due to sepsis / hypotension - crt improving steadily   A fib continue coumadin - rate control not optimal, but tx options limited by chronic hypotension - follow on tele - INR drifting back down toward therapeutic range slowly    Chronic Systolic CHF / Dilated Cardiomyopathy no evidence of signif volume overload at this time - +~6.2L since admit - baseline weight of late appears to be [employer]~67kg Filed Weights   06/26/15 2138 06/27/15 0500 06/28/15 0800  Weight: 61.7 kg (136 lb 0.4 oz) 61.7 kg (136 lb 0.4 oz) 60.8 kg (134 lb 0.6 oz)   Hypothyroidism Cont synthroid   Supratherapeutic INR resume warfarin when INR closer to goal - Pharmacy to follow   Hypokalemia replaced   Hypomagnesemia replaced   Code Status: DNR Family Communication: no family present at time of exam Disposition Plan: SDU - back to SNF eventually   Consultants: none  Procedures: none  Antibiotics: Zosyn 3/1 > Vancomycin 3/1 > 3/3  DVT prophylaxis: Warfarin   Objective: Blood pressure 111/88, pulse 95, temperature 97.6 F (36.4 C), temperature source Oral, resp. rate 14, height 5\' 4"  (1.626 m), weight 60.8 kg (134 lb 0.6 oz), SpO2 100 %.  Intake/Output Summary (Last  24 hours) at 06/30/15 1416 Last data filed at 06/30/15 1239  Gross per 24 hour  Intake 1287.5 ml  Output    300 ml  Net  987.5 ml   Exam: General: No acute respiratory distress - alert Lungs: Clear to auscultation bilaterally without wheezes or crackles  Cardiovascular: mild tachycardia at ~110bpm - no gallup   Abdomen: Nontender,  nondistended, soft, bowel sounds positive, no rebound Extremities: L LE dressed and dry - no cyanosis of feet B  Data Reviewed:  Basic Metabolic Panel:  Recent Labs Lab 06/26/15 2048 06/27/15 0847 06/28/15 0345 06/29/15 0230 06/30/15 0250  NA 140 141 139 141 141  K 3.4* 3.4* 4.1 4.3 4.5  CL 105 106 106 108 108  CO2 GLUCOSE 109* 101* 117* 83 80  BUN 42* 37* 38* 29* 20  CREATININE 2.05* 1.76* 1.48* 1.22* 1.01*  CALCIUM 7.7* 7.9* 8.2* 8.5* 8.6*  MG  --  1.8 2.1  --   --     CBC:  Recent Labs Lab 06/26/15 1202 06/27/15 0430 06/28/15 0345 06/29/15 0230 06/30/15 0250  WBC 19.9* 13.2* 12.9* 7.5 6.5  NEUTROABS 17.5*  --  11.3*  --   --   HGB 9.0* 8.3* 8.6* 9.3* 9.4*  HCT 28.7* 25.7* 27.4* 29.7* 30.6*  MCV 92.9 92.4 91.3 92.5 93.3  PLT 149* 111* 115* 127* 133*    Liver Function Tests:  Recent Labs Lab 06/26/15 1202 06/27/15 0847 06/28/15 0345 06/29/15 0230  AST 72* 50* 36 30  ALT 43 37 32 28  ALKPHOS 480* 387* 324* 285*  BILITOT 1.0 0.9 0.6 0.6  PROT 6.5 5.6* 5.8* 5.6*  ALBUMIN 2.3* 2.0* 1.9* 1.8*   Coags:  Recent Labs Lab 06/26/15 1834 06/27/15 0430 06/28/15 0345 06/29/15 0230 06/30/15 0250  INR 5.31* 5.84* 5.81* 4.36* 3.16*    Recent Labs Lab 06/26/15 1834  APTT 57*    Recent Results (from the past 240 hour(s))  Blood Culture (routine x 2)     Status: None (Preliminary result)   Collection Time: 06/26/15 11:50 AM  Result Value Ref Range Status   Specimen Description BLOOD RIGHT FOREARM  Final   Special Requests BOTTLES DRAWN AEROBIC AND ANAEROBIC  Final   Culture NO GROWTH 3 DAYS  Final   Report Status PENDING  Incomplete  Blood Culture (routine x 2)     Status: None (Preliminary result)   Collection Time: 06/26/15 12:10 PM  Result Value Ref Range Status   Specimen Description BLOOD LEFT HAND  Final   Special Requests BOTTLES DRAWN AEROBIC ONLY 5CCS  Final   Culture NO GROWTH 3 DAYS  Final   Report Status PENDING   Incomplete  Urine culture     Status: None   Collection Time: 06/26/15  7:56 PM  Result Value Ref Range Status   Specimen Description URINE, RANDOM  Final   Special Requests NONE  Final   Culture NO GROWTH 2 DAYS  Final   Report Status 06/28/2015 FINAL  Final  MRSA PCR Screening     Status: None   Collection Time: 06/26/15  9:49 PM  Result Value Ref Range Status   MRSA by PCR NEGATIVE NEGATIVE Final    Comment:        The GeneXpert MRSA Assay (FDA approved for NASAL specimens only), is one component of a comprehensive MRSA colonization surveillance program. It is not intended to diagnose MRSA infection nor to guide or monitor treatment for  MRSA infections.      Studies:   Recent x-ray studies have been reviewed in detail by the Attending Physician  Scheduled Meds:  Scheduled Meds: . aspirin EC  81 mg Oral Daily  . collagenase   Topical Daily  . feeding supplement (ENSURE ENLIVE)  237 mL Oral BID BM  . feeding supplement (PRO-STAT SUGAR FREE 64)  30 mL Oral BID  . levothyroxine  37.5 mcg Oral QAC breakfast  . metoprolol tartrate  12.5 mg Oral BID  . multivitamin with minerals  1 tablet Oral q morning - 10a  . piperacillin-tazobactam (ZOSYN)  IV  3.375 g Intravenous 3 times per day  . warfarin  3 mg Oral ONCE-1800  . Warfarin - Pharmacist Dosing Inpatient   Does not apply q1800    Time spent on care of this patient: 25 mins   [facility] T , MD   [facility] Office  [phone] Pager - Text Page per Amion as per below:  On-Call/Text Page:      [email or web address]      password [email or web address]  If 7PM-7AM, please contact night-coverage [email or web address] Password [email or web address] 06/30/2015, 2:16 PM   LOS: 4 days

## 2015-07-01 LAB — CBC
HCT: 31.6 % — ABNORMAL LOW (ref 36.0–46.0)
Hemoglobin: 9.7 g/dL — ABNORMAL LOW (ref 12.0–15.0)
MCH: 28.9 pg (ref 26.0–34.0)
MCHC: 30.7 g/dL (ref 30.0–36.0)
MCV: 94 fL (ref 78.0–100.0)
PLATELETS: 152 10*3/uL (ref 150–400)
RBC: 3.36 MIL/uL — ABNORMAL LOW (ref 3.87–5.11)
RDW: 21.2 % — AB (ref 11.5–15.5)
WBC: 6.2 10*3/uL (ref 4.0–10.5)

## 2015-07-01 LAB — COMPREHENSIVE METABOLIC PANEL
ALBUMIN: 2 g/dL — AB (ref 3.5–5.0)
ALT: 24 U/L (ref 14–54)
ANION GAP: 8 (ref 5–15)
AST: 32 U/L (ref 15–41)
Alkaline Phosphatase: 259 U/L — ABNORMAL HIGH (ref 38–126)
BILIRUBIN TOTAL: 0.7 mg/dL (ref 0.3–1.2)
BUN: 16 mg/dL (ref 6–20)
CALCIUM: 8.6 mg/dL — AB (ref 8.9–10.3)
CHLORIDE: 109 mmol/L (ref 101–111)
CO2: 26 mmol/L (ref 22–32)
Creatinine, Ser: 1.08 mg/dL — ABNORMAL HIGH (ref 0.44–1.00)
GFR calc Af Amer: 53 mL/min — ABNORMAL LOW (ref >60)
GFR, EST NON AFRICAN AMERICAN: 46 mL/min — AB (ref >60)
GLUCOSE: 96 mg/dL (ref 65–99)
POTASSIUM: 4.8 mmol/L (ref 3.5–5.1)
Sodium: 143 mmol/L (ref 135–145)
TOTAL PROTEIN: 6.5 g/dL (ref 6.5–8.1)

## 2015-07-01 LAB — CULTURE, BLOOD (ROUTINE X 2)
CULTURE: NO GROWTH
Culture: NO GROWTH

## 2015-07-01 LAB — PROTIME-INR
INR: 2.58 — AB (ref 0.00–1.49)
PROTHROMBIN TIME: 27.4 s — AB (ref 11.6–15.2)

## 2015-07-01 MED ORDER — WARFARIN SODIUM 3 MG PO TABS
3.0000 mg | ORAL_TABLET | Freq: Once | ORAL | Status: AC
  Administered 2015-07-01: 3 mg via ORAL
  Filled 2015-07-01: qty 1

## 2015-07-01 NOTE — Progress Notes (Signed)
ANTICOAGULATION CONSULT NOTE - Follow Up Consult  Pharmacy Consult for warfarin Indication: atrial fibrillation and hx DVT  Allergies  Allergen Reactions  . Bactrim Ds [Sulfamethoxazole-Trimethoprim] Rash  . Contrast Media [Iodinated Diagnostic Agents] Hives    BLE erythema   . Ciprofloxacin Rash  . Tape Rash    Patient Measurements: Height: 5\' 4"  (162.6 cm) Weight: 134 lb 0.6 oz (60.8 kg) IBW/kg (Calculated) : 54.7  Vital Signs: Temp: 97.8 F (36.6 C) (03/06 0804) Temp Source: Oral (03/06 0804) BP: 94/52 mmHg (03/06 0949) Pulse Rate: 106 (03/06 0949)  Labs:  Recent Labs  06/29/15 0230 06/30/15 0250 07/01/15 0429  HGB 9.3* 9.4* 9.7*  HCT 29.7* 30.6* 31.6*  PLT 127* 133* 152  LABPROT 40.6* 31.8* 27.4*  INR 4.36* 3.16* 2.58*  CREATININE 1.22* 1.01* 1.08*    Estimated Creatinine Clearance: 34.1 mL/min (by C-G formula based on Cr of 1.08).   Assessment: 82 y/o female on chronic warfarin for Afib/hx DVTadmitted with sepsis from LLE cellulitis. Pharmacy consulted to manage warfarin. PTA dose of 4.5 mg daily EXCEPT for 3 mg on Tues/Fri per clinic note. Admit INR was 5.31. INR now trending down to 2.58 today. Coumadin restarted on 3/5. Hgb 9.7, plts wnl  Goal of Therapy:  INR 2-3 Monitor platelets by anticoagulation protocol: Yes   Plan:  Give coumadin 3mg  PO x 1 tonight Monitor daily INR, CBC, s/s of bleed  Enzo BiNathan Arther Heisler, PharmD, BCPS Clinical Pharmacist Pager [phone] 07/01/2015 10:40 AM

## 2015-07-01 NOTE — Progress Notes (Signed)
[place] TEAM 1 - Stepdown/ICU TEAM PROGRESS NOTE  Kristina Diaz UKG:[id number] DOB: 06/04/1929 DOA: 06/26/2015 PCP: [name],PETER FRANK, MD  Admit HPI / Brief Narrative: 82 y.o. F Hxatrial fibrillation, PVD, systolic heart failure (EF 45 to 50%), LLE DVT, and chronic left leg wound who presented with fever.  The patient has a history of nonhealing ulcers of the left lower extremity and underwent left popliteal and tibial revascularization with PTA of the popliteal, anterior tibial, and peroneal arteries by Dr. Loreta AveWagner w/ IR on 05/09/2015. Postprocedure, she had a fall and was found to have a hematoma at the left SFA access which resulted in an drop in her hemoglobin. She was admitted at that time and received blood transfusions. She was also admitted 2/13 for fever and infection LE, worsening drainage from wound. She was treated with IV antibiotics and underwent an unremarkable venogram, with ultimate discharge to a skilled nursing facility. She returned to the hospital on 06/26/15 with fevers, persistent LE redness, and drainage. Evaluation revealed SBP in the 80s, and lactic acid 2.6. Xray LE noted new and increased periosteal reaction involving distal left Femur. WBC 19. Cr 2.5  HPI/Subjective: The patient is sitting up in a bedside chair eating her lunch.  She denies new complaints today.  She states the pain in her left leg is well-controlled.  She worked with physical therapy today and was able to walk only a short distance.  Assessment/Plan:  Severe Sepsis w/ hypotension due to LE cellulitis infection, concern for osteomyelitis sepsis has resolved - cont broad empiric abx coverage - appears hemodynamically stable at present - baseline SBP reportedly 100-110   LE cellulitis chronic wound infection - PAD -left popliteal and tibial revascularization with PTA of the popliteal, anterior tibial, and peroneal arteries by IR 05/09/2015 -Venogram 06/09/15 with patent vessels / no stricture  -Left  tib-fib CT negative for abscess -L leg plain film concerning for distal L femur osteo v/s findings related to PVD -cont abx tx for now as pt appears to be improving - MRI of leg to definitively eval for possible osteo to help guide length of abx tx - the only apparent tx option beyond conservative abx tx at this time appears to be amputation - discussed this w/ the pt, who would like to give abx a try at cure or at least suppression prior to considering evaluation for an amputation   Acute Renal failure due to sepsis / hypotension - crt improving steadily   A fib continue coumadin - rate control improved - tx options limited by chronic hypotension - follow on tele - INR drifting back down toward therapeutic range slowly    Chronic Systolic CHF / Dilated Cardiomyopathy no evidence of signif volume overload at this time - +~7.3L since admit - baseline weight of late appears to be ~67kg - still well below this weight  Filed Weights   06/26/15 2138 06/27/15 0500 06/28/15 0800  Weight: 61.7 kg (136 lb 0.4 oz) 61.7 kg (136 lb 0.4 oz) 60.8 kg (134 lb 0.6 oz)   Hypothyroidism Cont synthroid   Supratherapeutic INR resume warfarin when INR closer to goal - Pharmacy to follow   Hypokalemia replaced   Hypomagnesemia replaced   Code Status: DNR Family Communication: no family present at time of exam Disposition Plan: transfer to tele bed - MRI of leg - back to SNF eventually   Consultants: none  Procedures: none  Antibiotics: Zosyn 3/1 > Vancomycin 3/1 > 3/3  DVT prophylaxis: Warfarin  Objective: Blood pressure 81/48, pulse 80, temperature 98.2 F (36.8 C), temperature source Oral, resp. rate 15, height  (1.626 m), weight 60.8 kg (134 lb 0.6 oz), SpO2 100 %.  Intake/Output Summary (Last 24 hours) at 07/01/15 1321 Last data filed at 07/01/15 0916  Gross per 24 hour  Intake 1367.5 ml  Output    400 ml  Net  967.5 ml   Exam: General: No acute respiratory distress -  alert and pleasant  Lungs: Clear to auscultation bilaterally - no wheezes or crackles  Cardiovascular: regular rate - no gallup   Abdomen: Nontender, nondistended, soft, bowel sounds positive, no rebound Extremities: L LE dressed - continues to weep serous type fluid - no cyanosis of feet B  Data Reviewed:  Basic Metabolic Panel:  Recent Labs Lab 06/27/15 0847 06/28/15 0345 06/29/15 0230 06/30/15 0250 07/01/15 0429  NA 141 139 141 141 143  K 3.4* 4.1 4.3 4.5 4.8  CL 106 106 108 108 109  CO2 GLUCOSE 101* 117* 83 80 96  BUN 37* 38* 29* 20 16  CREATININE 1.76* 1.48* 1.22* 1.01* 1.08*  CALCIUM 7.9* 8.2* 8.5* 8.6* 8.6*  MG 1.8 2.1  --   --   --     CBC:  Recent Labs Lab 06/26/15 1202 06/27/15 0430 06/28/15 0345 06/29/15 0230 06/30/15 0250 07/01/15 0429  WBC 19.9* 13.2* 12.9* 7.5 6.5 6.2  NEUTROABS 17.5*  --  11.3*  --   --   --   HGB 9.0* 8.3* 8.6* 9.3* 9.4* 9.7*  HCT 28.7* 25.7* 27.4* 29.7* 30.6* 31.6*  MCV 92.9 92.4 91.3 92.5 93.3 94.0  PLT 149* 111* 115* 127* 133* 152    Liver Function Tests:  Recent Labs Lab 06/26/15 1202 06/27/15 0847 06/28/15 0345 06/29/15 0230 07/01/15 0429  AST 72* 50* 36 30 32  ALT 43 37 32 28 24  ALKPHOS 480* 387* 324* 285* 259*  BILITOT 1.0 0.9 0.6 0.6 0.7  PROT 6.5 5.6* 5.8* 5.6* 6.5  ALBUMIN 2.3* 2.0* 1.9* 1.8* 2.0*   Coags:  Recent Labs Lab 06/27/15 0430 06/28/15 0345 06/29/15 0230 06/30/15 0250 07/01/15 0429  INR 5.84* 5.81* 4.36* 3.16* 2.58*    Recent Labs Lab 06/26/15 1834  APTT 57*    Recent Results (from the past 240 hour(s))  Blood Culture (routine x 2)     Status: None (Preliminary result)   Collection Time: 06/26/15 11:50 AM  Result Value Ref Range Status   Specimen Description BLOOD RIGHT FOREARM  Final   Special Requests BOTTLES DRAWN AEROBIC AND ANAEROBIC  Final   Culture NO GROWTH 4 DAYS  Final   Report Status PENDING  Incomplete  Blood Culture (routine x 2)     Status:  None (Preliminary result)   Collection Time: 06/26/15 12:10 PM  Result Value Ref Range Status   Specimen Description BLOOD LEFT HAND  Final   Special Requests BOTTLES DRAWN AEROBIC ONLY 5CCS  Final   Culture NO GROWTH 4 DAYS  Final   Report Status PENDING  Incomplete  Urine culture     Status: None   Collection Time: 06/26/15  7:56 PM  Result Value Ref Range Status   Specimen Description URINE, RANDOM  Final   Special Requests NONE  Final   Culture NO GROWTH 2 DAYS  Final   Report Status 06/28/2015 FINAL  Final  MRSA PCR Screening     Status: None   Collection Time: 06/26/15  9:49  PM  Result Value Ref Range Status   MRSA by PCR NEGATIVE NEGATIVE Final    Comment:        The GeneXpert MRSA Assay (FDA approved for NASAL specimens only), is one component of a comprehensive MRSA colonization surveillance program. It is not intended to diagnose MRSA infection nor to guide or monitor treatment for MRSA infections.      Studies:   Recent x-ray studies have been reviewed in detail by the Attending Physician  Scheduled Meds:  Scheduled Meds: . aspirin EC  81 mg Oral Daily  . collagenase   Topical Daily  . feeding supplement (ENSURE ENLIVE)  237 mL Oral BID BM  . feeding supplement (PRO-STAT SUGAR FREE 64)  30 mL Oral BID  . levothyroxine  37.5 mcg Oral QAC breakfast  . metoprolol tartrate  12.5 mg Oral BID  . multivitamin with minerals  1 tablet Oral q morning - 10a  . piperacillin-tazobactam (ZOSYN)  IV  3.375 g Intravenous 3 times per day  . warfarin  3 mg Oral ONCE-1800  . Warfarin - Pharmacist Dosing Inpatient   Does not apply q1800    Time spent on care of this patient: 25 mins   [facility] T , MD   [facility] Office  [phone] Pager - Text Page per Amion as per below:  On-Call/Text Page:      [email or web address]      password [email or web address]  If 7PM-7AM, please contact night-coverage [email or web address] Password [email or web address] 07/01/2015, 1:21 PM   LOS: 5 days

## 2015-07-01 NOTE — Progress Notes (Signed)
Physical Therapy Treatment Patient Details Name: Kristina Diaz MRN: [id number] DOB: 08/09/1929 Today's Date: 07/01/2015    History of Present Illness 82 yo female admitted with fever, persistent LE redness and drainage. Sepsis related to LE cellulitis infection concerns for osteomyselitis. PMH: Atrial Fibrillation, PVD, HF LLE DVT, chronic L LE wound. 05/09/15 fall with hematoma at L SFA access    PT Comments    Pt able to tolerate 20' ambulation this date but con't to have drainage from bilat LEs and limited WBing through L LE. Pt remains motivated. Con't to recommend SNF as pt at increased falls risk with decreased strength and edurance.  Follow Up Recommendations  SNF;Supervision/Assistance - 24 hour     Equipment Recommendations  None recommended by PT    Recommendations for Other Services       Precautions / Restrictions Precautions Precautions: Fall Precaution Comments: bilat LE wounds with drainage Restrictions Weight Bearing Restrictions: No    Mobility  Bed Mobility Overal bed mobility: Needs Assistance Bed Mobility: Supine to Sit     Supine to sit: Min assist     General bed mobility comments: pt able to bring LEs off bed, used bed rail and min/a for trunk elevation  Transfers Overall transfer level: Needs assistance Equipment used: Rolling walker (2 wheeled) Transfers: Sit to/from Stand Sit to Stand: Mod assist;+2 physical assistance         General transfer comment: v/c's to push up from bed, assist for initiatl anterior weight shift and to clear bottom, limited L LE wbing  Ambulation/Gait Ambulation/Gait assistance: Mod assist;+2 physical assistance;+2 safety/equipment Ambulation Distance (Feet): 20 Feet Assistive device: Rolling walker (2 wheeled) Gait Pattern/deviations: Step-to pattern;Decreased stance time - left;Trunk flexed;Narrow base of support Gait velocity: slow   General Gait Details: pt able to bear weight through L ball of foot, modA from  PT to crutch under R arm pit, strong use of UEs on RW   Stairs            Wheelchair Mobility    Modified Rankin (Stroke Patients Only)       Balance Overall balance assessment: Needs assistance Sitting-balance support: Feet supported;Single extremity supported Sitting balance-Leahy Scale: Fair     Standing balance support: Bilateral upper extremity supported Standing balance-Leahy Scale: Poor Standing balance comment: relient on UEs                    Cognition Arousal/Alertness: Awake/alert Behavior During Therapy: WFL for tasks assessed/performed Overall Cognitive Status: Within Functional Limits for tasks assessed                      Exercises      General Comments        Pertinent Vitals/Pain Pain Assessment: Faces Faces Pain Scale: Hurts whole lot Pain Location: L LE Pain Descriptors / Indicators: Aching;Constant Pain Intervention(s): Monitored during session    Home Living                      Prior Function            PT Goals (current goals can now be found in the care plan section) Acute Rehab PT Goals Patient Stated Goal: get her legs better Progress towards PT goals: Progressing toward goals    Frequency  Min 2X/week    PT Plan Current plan remains appropriate    Co-evaluation             End of  Session Equipment Utilized During Treatment: Gait belt Activity Tolerance: Patient limited by fatigue;Patient limited by pain Patient left: in chair;with call bell/phone within reach;with chair alarm set     Time: 8413-2440 PT Time Calculation (min) (ACUTE ONLY): 21 min  Charges:  $Gait Training: 8-22 mins                    G Codes:      Marcene Brawn 07/01/2015, 3:12 PM  Lewis Shock, PT, DPT Pager #: [phone] Office #: [phone]

## 2015-07-02 ENCOUNTER — Encounter (HOSPITAL_BASED_OUTPATIENT_CLINIC_OR_DEPARTMENT_OTHER): Payer: Medicare Other | Attending: Surgery

## 2015-07-02 ENCOUNTER — Inpatient Hospital Stay (HOSPITAL_COMMUNITY): Payer: Medicare Other

## 2015-07-02 DIAGNOSIS — L97819 Non-pressure chronic ulcer of other part of right lower leg with unspecified severity: Secondary | ICD-10-CM | POA: Insufficient documentation

## 2015-07-02 DIAGNOSIS — I87313 Chronic venous hypertension (idiopathic) with ulcer of bilateral lower extremity: Secondary | ICD-10-CM | POA: Insufficient documentation

## 2015-07-02 DIAGNOSIS — I70248 Atherosclerosis of native arteries of left leg with ulceration of other part of lower left leg: Secondary | ICD-10-CM | POA: Insufficient documentation

## 2015-07-02 DIAGNOSIS — L97511 Non-pressure chronic ulcer of other part of right foot limited to breakdown of skin: Secondary | ICD-10-CM | POA: Insufficient documentation

## 2015-07-02 DIAGNOSIS — L97322 Non-pressure chronic ulcer of left ankle with fat layer exposed: Secondary | ICD-10-CM | POA: Insufficient documentation

## 2015-07-02 DIAGNOSIS — I70238 Atherosclerosis of native arteries of right leg with ulceration of other part of lower right leg: Secondary | ICD-10-CM | POA: Insufficient documentation

## 2015-07-02 DIAGNOSIS — L89620 Pressure ulcer of left heel, unstageable: Secondary | ICD-10-CM | POA: Insufficient documentation

## 2015-07-02 DIAGNOSIS — Z86718 Personal history of other venous thrombosis and embolism: Secondary | ICD-10-CM | POA: Insufficient documentation

## 2015-07-02 DIAGNOSIS — I110 Hypertensive heart disease with heart failure: Secondary | ICD-10-CM | POA: Insufficient documentation

## 2015-07-02 DIAGNOSIS — L97822 Non-pressure chronic ulcer of other part of left lower leg with fat layer exposed: Secondary | ICD-10-CM | POA: Insufficient documentation

## 2015-07-02 DIAGNOSIS — I890 Lymphedema, not elsewhere classified: Secondary | ICD-10-CM | POA: Insufficient documentation

## 2015-07-02 DIAGNOSIS — L97521 Non-pressure chronic ulcer of other part of left foot limited to breakdown of skin: Secondary | ICD-10-CM | POA: Insufficient documentation

## 2015-07-02 DIAGNOSIS — I509 Heart failure, unspecified: Secondary | ICD-10-CM | POA: Insufficient documentation

## 2015-07-02 LAB — PROTIME-INR
INR: 2.55 — AB (ref 0.00–1.49)
PROTHROMBIN TIME: 27.1 s — AB (ref 11.6–15.2)

## 2015-07-02 LAB — CBC
HEMATOCRIT: 26.5 % — AB (ref 36.0–46.0)
Hemoglobin: 8.2 g/dL — ABNORMAL LOW (ref 12.0–15.0)
MCH: 29 pg (ref 26.0–34.0)
MCHC: 30.9 g/dL (ref 30.0–36.0)
MCV: 93.6 fL (ref 78.0–100.0)
PLATELETS: 125 10*3/uL — AB (ref 150–400)
RBC: 2.83 MIL/uL — ABNORMAL LOW (ref 3.87–5.11)
RDW: 20.8 % — AB (ref 11.5–15.5)
WBC: 4.7 10*3/uL (ref 4.0–10.5)

## 2015-07-02 MED ORDER — VANCOMYCIN HCL IN DEXTROSE 750-5 MG/150ML-% IV SOLN
750.0000 mg | INTRAVENOUS | Status: DC
  Administered 2015-07-02 – 2015-07-03 (×2): 750 mg via INTRAVENOUS
  Filled 2015-07-02 (×2): qty 150

## 2015-07-02 MED ORDER — WARFARIN SODIUM 3 MG PO TABS
3.0000 mg | ORAL_TABLET | Freq: Once | ORAL | Status: AC
  Administered 2015-07-02: 3 mg via ORAL
  Filled 2015-07-02: qty 1

## 2015-07-02 NOTE — Progress Notes (Addendum)
ANTICOAGULATION CONSULT NOTE - Follow Up Consult  Pharmacy Consult for warfarin Indication: atrial fibrillation and hx DVT  Allergies  Allergen Reactions  . Bactrim Ds [Sulfamethoxazole-Trimethoprim] Rash  . Contrast Media [Iodinated Diagnostic Agents] Hives    BLE erythema   . Ciprofloxacin Rash  . Tape Rash    Patient Measurements: Height: 5\' 4"  (162.6 cm) Weight: 135 lb (61.236 kg) IBW/kg (Calculated) : 54.7  Vital Signs: Temp: 98.1 F (36.7 C) (03/07 0806) Temp Source: Oral (03/07 0806) BP: 113/46 mmHg (03/07 0806) Pulse Rate: 70 (03/07 0806)  Labs:  Recent Labs  06/30/15 0250 07/01/15 0429 07/02/15 0456  HGB 9.4* 9.7* 8.2*  HCT 30.6* 31.6* 26.5*  PLT 133* 152 125*  LABPROT 31.8* 27.4* 27.1*  INR 3.16* 2.58* 2.55*  CREATININE 1.01* 1.08*  --     Estimated Creatinine Clearance: 34.1 mL/min (by C-G formula based on Cr of 1.08).   Assessment: 82 y/o female on chronic warfarin for Afib/hx DVTadmitted with sepsis from LLE cellulitis. Pharmacy consulted to manage warfarin. INR 2.55 today  PTA dose of 4.5 mg daily EXCEPT for 3 mg on Tues/Fri per clinic note. Admit INR was 5.31.   Goal of Therapy:  INR 2-3 Monitor platelets by anticoagulation protocol: Yes   Plan:  Give coumadin 3mg  PO x 1 tonight Monitor daily INR, CBC, s/s of bleed  Bayard HuggerMei Henrick Mcgue, PharmD, BCPS  Clinical Pharmacist  Pager: [phone]   07/02/2015 11:55 AM  Addendum: Patient is on D#7 zosyn for chronic LLE wound infection. Wound looks worse, to restart vancomycin. Afeb, WBC down to wnl. MRI to eval for osteo. AoCKD on admission (baseline 1.2-1.4, admit 2.05) SCr down to 1.08 now, est. crcl ~ 30 - 35 ml/min.  Plan:  Continue Zosyn to 3.375g/8h (4 hr infusion) per MD Vancomycin 750 mg IV Q 24 hrs Monitor renal function  Bayard HuggerMei Rodrigues Urbanek, PharmD, BCPS  Clinical Pharmacist  Pager: [phone]

## 2015-07-02 NOTE — Progress Notes (Signed)
Occupational Therapy Treatment Patient Details Name: Kristina Diaz MRN: [id number] DOB: 03/27/1930 Today's Date: 07/02/2015    History of present illness 82 yo female admitted with fever, persistent LE redness and drainage. Sepsis related to LE cellulitis infection concerns for osteomyselitis. PMH: Atrial Fibrillation, PVD, HF LLE DVT, chronic L LE wound. 05/09/15 fall with hematoma at L SFA access   OT comments  Pt demonstrates ability to complete basic transfer to use 3n1 with RN / tech (A). Recommend transfer to 3n1 and discontinue use of bed pan. Pt demonstrates decr weight bearing on L LE with transfers to sink level this session. Pt c/o pain medication not being strong enough. Pt advised to attempt oral medications only to prepare for d/c due to IV medication can not be given once d/c to SNF. Pt expresses understanding. Pt states "i know i rely on the medication but I just need it "   Follow Up Recommendations  SNF;Supervision/Assistance - 24 hour    Equipment Recommendations  3 in 1 bedside comode;Wheelchair (measurements OT);Wheelchair cushion (measurements OT);Hospital bed    Recommendations for Other Services      Precautions / Restrictions Precautions Precautions: Fall Precaution Comments: bilat LE wounds with drainage Restrictions Weight Bearing Restrictions: No       Mobility Bed Mobility Overal bed mobility: Needs Assistance Bed Mobility: Supine to Sit           General bed mobility comments: Required (A) to position bil Le into bed and becomes liable suddenly with c/o pain. pt able to immediately stop and answer therapist questions.   Transfers Overall transfer level: Needs assistance Equipment used: Rolling walker (2 wheeled) Transfers: Sit to/from Stand Sit to Stand: Mod assist              Balance Overall balance assessment: Needs assistance   Sitting balance-Leahy Scale: Fair       Standing balance-Leahy Scale: Poor                      ADL Overall ADL's : Needs assistance/impaired     Grooming: Wash/dry hands;Minimal assistance;Standing Grooming Details (indicate cue type and reason): required rest break after transfer to sink due to DOE and pain in L LE . pt unable to place LLE completely on the ground             Lower Body Dressing: Moderate assistance;Bed level Lower Body Dressing Details (indicate cue type and reason): don croc shoes-- unable to keep L croc shoe on due to wb only at the toes Toilet Transfer: Minimal assistance;BSC;RW           Functional mobility during ADLs: Minimal assistance;Rolling walker General ADL Comments: pt demonstrates deficits with detail to hygiene, basic transfers, weight bearing L LE and bed mobility. pt reports she only gets help at ALF if requesting and has to wait for them to come. Recommend SNF continued      Vision                     Perception     Praxis      Cognition   Behavior During Therapy: [facility]WFL for tasks assessed/performed Overall Cognitive Status: Within Functional Limits for tasks assessed                       Extremity/Trunk Assessment               Exercises     Shoulder Instructions  General Comments      Pertinent Vitals/ Pain       Pain Assessment: Faces Faces Pain Scale: Hurts whole lot Pain Location: LLE Pain Intervention(s): Monitored during session;Repositioned;Patient requesting pain meds-RN notified  Home Living                                          Prior Functioning/Environment              Frequency Min 2X/week     Progress Toward Goals  OT Goals(current goals can now be found in the care plan section)  Progress towards OT goals: Progressing toward goals  Acute Rehab OT Goals Patient Stated Goal: get her legs better OT Goal Formulation: With patient Time For Goal Achievement: 07/12/15 Potential to Achieve Goals: Good ADL Goals Pt Will Perform Grooming: with  set-up;sitting Pt Will Perform Upper Body Bathing: with set-up;sitting Pt Will Perform Lower Body Bathing: with min assist;sit to/from stand Pt Will Perform Lower Body Dressing: with min assist;sit to/from stand Pt Will Transfer to Toilet: with mod assist;bedside commode;stand pivot transfer Pt Will Perform Toileting - Clothing Manipulation and hygiene: with min assist;sit to/from stand Additional ADL Goal #1: Pt will complete bed mobility min guard (A) as precursor to adls  Plan Discharge plan remains appropriate    Co-evaluation                 End of Session Equipment Utilized During Treatment: Gait belt;Rolling walker;Oxygen   Activity Tolerance Patient limited by pain   Patient Left in bed;with call bell/phone within reach   Nurse Communication Mobility status;Precautions        Time: 0921-1000 OT Time Calculation (min): 39 min  Charges: OT General Charges $OT Visit: 1 Procedure OT Treatments $Self Care/Home Management : 23-37 mins $Therapeutic Activity: 8-22 mins  Boone Master B 07/02/2015, 12:30 PM   Mateo Flow   OTR/L Pager: [phone] Office: [phone] .

## 2015-07-02 NOTE — [place] FL2 (Signed)
[place] MEDICAID FL2 LEVEL OF CARE SCREENING TOOL     IDENTIFICATION  Patient Name: Rigoberto Noelancy Crounse Birthdate: 03/29/1930 Sex: female Admission Date (Current Location): 06/26/2015  [facility] [place]   Facility and Address:  [facility], [address], [place], [place] [address]      Provider Number: [id number]  Attending Physician Name and Address:  Alba CoryBelkys A Regalado, MD  Relative Name and Phone Number:       Current Level of Care: Hospital Recommended Level of Care: Assisted Living Facility Prior Approval Number:    Date Approved/Denied:   PASRR Number:    Discharge Plan: Other (Comment) [facility]    Current Diagnoses: Patient Active Problem List   Diagnosis Date Noted  . Cellulitis of left lower leg   . Fever   . Wound infection (HCC)   . Chronic systolic heart failure (HCC)   . Dilated cardiomyopathy (HCC)   . Supratherapeutic INR   . Acute renal failure (ARF) (HCC) 06/26/2015  . Protein-calorie malnutrition, severe 06/13/2015  . AKI (acute kidney injury) (HCC) 06/10/2015  . Syncope 05/10/2015  . Hypotension 05/10/2015  . Anemia, blood loss 05/10/2015  . Fall 05/10/2015  . Hematoma   . DVT (deep vein thrombosis) in pregnancy   . Critical lower limb ischemia   . Pressure ulcer 01/25/2015  . Blood poisoning (HCC)   . Atrial fibrillation with RVR (HCC)   . Chronic systolic CHF (congestive heart failure) (HCC)   . Cellulitis of left lower extremity   . Sepsis (HCC) 01/23/2015  . Leg ulcer, left (HCC)   . Encounter for therapeutic drug monitoring 05/31/2013  . Chronic combined systolic and diastolic heart failure (HCC) 12/10/2012  . Long term (current) use of anticoagulants 08/22/2012  . Chronic diastolic heart failure 08/18/2012  . Wound of lower extremity 08/08/2012  . Acute on chronic diastolic heart failure (HCC) 08/07/2012  . Hypothyroidism 08/07/2012  . Right heart failure (HCC) 08/04/2012  . Edema  07/26/2012  . Atherosclerotic peripheral vascular disease with ulceration (HCC) 06/30/2012  . PAD (peripheral artery disease) (HCC)   . Atrial fibrillation (HCC)   . Hypertension   . Thyroid disease     Orientation RESPIRATION BLADDER Height & Weight     Self, Time, Situation, Place  Normal Continent Weight: 135 lb (61.236 kg) Height:  5\' 4"  (162.6 cm)  BEHAVIORAL SYMPTOMS/MOOD NEUROLOGICAL BOWEL NUTRITION STATUS      Continent    AMBULATORY STATUS COMMUNICATION OF NEEDS Skin   Limited Assist Verbally Normal                       Personal Care Assistance Level of Assistance  Bathing, Feeding, Dressing Bathing Assistance: Limited assistance Feeding assistance: Independent Dressing Assistance: Limited assistance     Functional Limitations Info  Sight, Hearing, Speech Sight Info: Impaired Hearing Info: Adequate Speech Info: Adequate    SPECIAL CARE FACTORS FREQUENCY  PT (By licensed PT)                    Contractures      Additional Factors Info  Code Status, Allergies Code Status Info: DNR Allergies Info: Bactrim Ds, Contrast Media, Ciprofloxacin, Tape           Current Medications (07/02/2015):  This is the current hospital active medication list Current Facility-Administered Medications  Medication Dose Route Frequency Provider Last Rate Last Dose  . 0.9 %  sodium chloride  infusion   Intravenous Continuous Lonia Blood, MD 10 mL/hr at 07/01/15 1631    . acetaminophen (TYLENOL) tablet 650 mg  650 mg Oral Q6H PRN Belkys A Regalado, MD   650 mg at 06/28/15 1442   Or  . acetaminophen (TYLENOL) suppository 650 mg  650 mg Rectal Q6H PRN Belkys A Regalado, MD      . aspirin EC tablet 81 mg  81 mg Oral Daily Belkys A Regalado, MD   81 mg at 07/02/15 1007  . collagenase (SANTYL) ointment   Topical Daily Drema Dallas, MD      . feeding supplement (ENSURE ENLIVE) (ENSURE ENLIVE) liquid 237 mL  237 mL Oral BID BM Belkys A Regalado, MD   237 mL at 07/01/15  1330  . feeding supplement (PRO-STAT SUGAR FREE 64) liquid 30 mL  30 mL Oral BID Belkys A Regalado, MD   30 mL at 07/02/15 1010  . levothyroxine (SYNTHROID, LEVOTHROID) tablet 37.5 mcg  37.5 mcg Oral QAC breakfast Belkys A Regalado, MD   37.5 mcg at 07/02/15 0528  . metoprolol tartrate (LOPRESSOR) tablet 12.5 mg  12.5 mg Oral BID Lonia Blood, MD   12.5 mg at 07/02/15 1007  . morphine 2 MG/ML injection 1-2 mg  1-2 mg Intravenous Q2H PRN Lonia Blood, MD   2 mg at 07/02/15 1007  . multivitamin with minerals tablet 1 tablet  1 tablet Oral q morning - 10a Belkys A Regalado, MD   1 tablet at 07/02/15 1007  . ondansetron (ZOFRAN) tablet 4 mg  4 mg Oral Q6H PRN Belkys A Regalado, MD       Or  . ondansetron (ZOFRAN) injection 4 mg  4 mg Intravenous Q6H PRN Belkys A Regalado, MD      . oxyCODONE (Oxy IR/ROXICODONE) immediate release tablet 5-10 mg  5-10 mg Oral Q4H PRN Lonia Blood, MD   10 mg at 07/02/15 0528  . piperacillin-tazobactam (ZOSYN) IVPB 3.375 g  3.375 g Intravenous 3 times per day Ann Held, RPH   3.375 g at 07/02/15 3419  . warfarin (COUMADIN) tablet 3 mg  3 mg Oral ONCE-1800 Robinette Haines, [facility]      . Warfarin - Pharmacist Dosing Inpatient   Does not apply q1800 Lynita Lombard [place], [facility]   Stopped at 06/29/15 1800     Discharge Medications: Please see discharge summary for a list of discharge medications.  Relevant Imaging Results:  Relevant Lab Results:   Additional Information SS#: [id number]  Loleta Dicker, LCSW

## 2015-07-02 NOTE — Clinical Social Work Note (Signed)
Clinical Social Work Assessment  Patient Details  Name: Kristina Diaz MRN: [id number] Date of Birth: 11/29/1929  Date of referral:  07/01/15               Reason for consult:  Facility Placement                Permission sought to share information with:  Family Supports, Oceanographeracility Contact Representative Permission granted to share information::  Yes, Verbal Permission Granted  Name::     Kristina Diaz  [facility]  Relationship::  son  Contact Information:     Housing/Transportation Living arrangements for the past 2 months:  [facility] Source of Information:  Patient, Adult Children Patient Interpreter Needed:  None Criminal Activity/Legal Involvement Pertinent to Current Situation/Hospitalization:  No - Comment as needed Significant Relationships:  Adult Children Lives with:  Facility Resident, Spouse Do you feel safe going back to the place where you live?  Yes Need for family participation in patient care:  Yes (Comment) (son helps with coordination/ decision making)  Care giving concerns: none expressed by family- pt is resident at ALF and they provide max assist with transfers for her there- wheelchair bound most of the day at baseline   Office managerocial Worker assessment / plan:  CSW spoke with pt and son concerning PT recommendation for SNF.  CSW had worked with pt and son during last admission and they had strong preference for [facility] she [facility] with her husband.  Son had no concerns about how pt was being cared for at ALF after last admission and continues to think that returning to ALF is best move for the patient.  Employment status:  Retired Health and safety inspectornsurance information:  [employer] PT Recommendations:  Skilled Nursing Facility Information / Referral to community resources:  Skilled Nursing Facility  Patient/Family's Response to care:  Pt and son are agreeable to [facility]  Patient/Family's  Understanding of and Emotional Response to Diagnosis, Current Treatment, and Prognosis:  Pt has no questions or concerns about prognosis or treatment at this time and main concern is returning to ALF with her husband.  Emotional Assessment Appearance:  Appears stated age Attitude/Demeanor/Rapport:    Affect (typically observed):  Appropriate Orientation:  Oriented to Self, Oriented to Place, Oriented to  Time, Oriented to Situation Alcohol / Substance use:  Not Applicable Psych involvement (Current and /or in the community):  No (Comment)  Discharge Needs  Concerns to be addressed:  Care Coordination Readmission within the last 30 days:  Yes Current discharge risk:  None Barriers to Discharge:  Continued Medical Work up   [employer] EnergyHoloman, Kostantinos Tallman M, LCSW 07/02/2015, 10:02 AM

## 2015-07-02 NOTE — Progress Notes (Signed)
[place] TEAM 1 - Stepdown/ICU TEAM PROGRESS NOTE  Kristina Diaz ZOX:[id number] DOB: 08-12-1929 DOA: 06/26/2015 PCP: [name], MD  Admit HPI / Brief Narrative: 82 y.o. F Hxatrial fibrillation, PVD, systolic heart failure (EF 45 to 50%), LLE DVT, and chronic left leg wound who presented with fever.  The patient has a history of nonhealing ulcers of the left lower extremity and underwent left popliteal and tibial revascularization with PTA of the popliteal, anterior tibial, and peroneal arteries by Dr. Loreta Ave w/ IR on 05/09/2015. Postprocedure, she had a fall and was found to have a hematoma at the left SFA access which resulted in an drop in her hemoglobin. She was admitted at that time and received blood transfusions. She was also admitted 2/13 for fever and infection LE, worsening drainage from wound. She was treated with IV antibiotics and underwent an unremarkable venogram, with ultimate discharge to a skilled nursing facility. She returned to the hospital on 06/26/15 with fevers, persistent LE redness, and drainage. Evaluation revealed SBP in the 80s, and lactic acid 2.6. Xray LE noted new and increased periosteal reaction involving distal left Femur. WBC 19. Cr 2.5  HPI/Subjective: She is feeling better, looks better than on admission.  Pain LE is controlled.   Assessment/Plan:  Severe Sepsis w/ hypotension due to LE cellulitis infection, concern for osteomyelitis sepsis has resolved - cont broad empiric abx coverage - appears hemodynamically stable at present - baseline SBP reportedly 100-110   LE cellulitis chronic wound infection - PAD -left popliteal and tibial revascularization with PTA of the popliteal, anterior tibial, and peroneal arteries by IR 05/09/2015 -Venogram 06/09/15 with patent vessels / no stricture  -Left tib-fib CT negative for abscess -L leg plain film concerning for distal L femur osteo v/s findings related to PVD -cont abx tx for now as pt appears to be  improving - MRI of leg to definitively eval for possible osteo to help guide length of abx tx - the only apparent tx option beyond conservative abx tx at this time appears to be amputation - discussed this w/ the pt, who would like to give abx a try at cure or at least suppression prior to considering evaluation for an amputation  MRI pending.  Continue with IV antibiotics,.   Acute Renal failure due to sepsis / hypotension - crt improving steadily  Repeat labs in am.   A fib continue coumadin - rate control improved - tx options limited by chronic hypotension - follow on tele - INR drifting back down toward therapeutic range slowly    Chronic Systolic CHF / Dilated Cardiomyopathy no evidence of signif volume overload at this time - +~7.3L since admit - baseline weight of late appears to be ~67kg - still well below this weight  Weight today at 61.   Filed Weights   06/28/15 0800 07/02/15 0022 07/02/15 0457  Weight: 60.8 kg (134 lb 0.6 oz) 61.236 kg (135 lb) 61.236 kg (135 lb)   Hypothyroidism Cont synthroid   Supratherapeutic INR resume warfarin when INR closer to goal - Pharmacy to follow   Hypokalemia replaced   Hypomagnesemia replaced   Code Status: DNR Family Communication: no family present at time of exam Disposition Plan: transfer to tele bed - MRI of leg - back to SNF eventually   Consultants: none  Procedures: none  Antibiotics: Zosyn 3/1 > Vancomycin 3/1 > 3/3  DVT prophylaxis: Warfarin   Objective: Blood pressure 107/61, pulse 97, temperature 97.9 F (36.6 C), temperature source Oral, resp.  rate 18, height  (1.626 m), weight 61.236 kg (135 lb), SpO2 100 %.  Intake/Output Summary (Last 24 hours) at 07/02/15 1205 Last data filed at 07/02/15 1000  Gross per 24 hour  Intake 1760.67 ml  Output    250 ml  Net 1510.67 ml   Exam: General: No acute respiratory distress - alert and pleasant  Lungs: Clear to auscultation bilaterally - no wheezes or  crackles  Cardiovascular: regular rate - no gallup   Abdomen: Nontender, nondistended, soft, bowel sounds positive, no rebound Extremities: L LE dressed - continues to weep serous type fluid - no cyanosis of feet B  Data Reviewed:  Basic Metabolic Panel:  Recent Labs Lab 06/27/15 0847 06/28/15 0345 06/29/15 0230 06/30/15 0250 07/01/15 0429  NA 141 139 141 141 143  K 3.4* 4.1 4.3 4.5 4.8  CL 106 106 108 108 109  CO2 GLUCOSE 101* 117* 83 80 96  BUN 37* 38* 29* 20 16  CREATININE 1.76* 1.48* 1.22* 1.01* 1.08*  CALCIUM 7.9* 8.2* 8.5* 8.6* 8.6*  MG 1.8 2.1  --   --   --     CBC:  Recent Labs Lab 06/26/15 1202  06/28/15 0345 06/29/15 0230 06/30/15 0250 07/01/15 0429 07/02/15 0456  WBC 19.9*  < > 12.9* 7.5 6.5 6.2 4.7  NEUTROABS 17.5*  --  11.3*  --   --   --   --   HGB 9.0*  < > 8.6* 9.3* 9.4* 9.7* 8.2*  HCT 28.7*  < > 27.4* 29.7* 30.6* 31.6* 26.5*  MCV 92.9  < > 91.3 92.5 93.3 94.0 93.6  PLT 149*  < > 115* 127* 133* 152 125*  < > = values in this interval not displayed.  Liver Function Tests:  Recent Labs Lab 06/26/15 1202 06/27/15 0847 06/28/15 0345 06/29/15 0230 07/01/15 0429  AST 72* 50* 36 30 32  ALT 43 37 32 28 24  ALKPHOS 480* 387* 324* 285* 259*  BILITOT 1.0 0.9 0.6 0.6 0.7  PROT 6.5 5.6* 5.8* 5.6* 6.5  ALBUMIN 2.3* 2.0* 1.9* 1.8* 2.0*   Coags:  Recent Labs Lab 06/28/15 0345 06/29/15 0230 06/30/15 0250 07/01/15 0429 07/02/15 0456  INR 5.81* 4.36* 3.16* 2.58* 2.55*    Recent Labs Lab 06/26/15 1834  APTT 57*    Recent Results (from the past 240 hour(s))  Blood Culture (routine x 2)     Status: None   Collection Time: 06/26/15 11:50 AM  Result Value Ref Range Status   Specimen Description BLOOD RIGHT FOREARM  Final   Special Requests BOTTLES DRAWN AEROBIC AND ANAEROBIC  Final   Culture NO GROWTH 5 DAYS  Final   Report Status 07/01/2015 FINAL  Final  Blood Culture (routine x 2)     Status: None   Collection  Time: 06/26/15 12:10 PM  Result Value Ref Range Status   Specimen Description BLOOD LEFT HAND  Final   Special Requests BOTTLES DRAWN AEROBIC ONLY 5CCS  Final   Culture NO GROWTH 5 DAYS  Final   Report Status 07/01/2015 FINAL  Final  Urine culture     Status: None   Collection Time: 06/26/15  7:56 PM  Result Value Ref Range Status   Specimen Description URINE, RANDOM  Final   Special Requests NONE  Final   Culture NO GROWTH 2 DAYS  Final   Report Status 06/28/2015 FINAL  Final  MRSA PCR Screening     Status: None  Collection Time: 06/26/15  9:49 PM  Result Value Ref Range Status   MRSA by PCR NEGATIVE NEGATIVE Final    Comment:        The GeneXpert MRSA Assay (FDA approved for NASAL specimens only), is one component of a comprehensive MRSA colonization surveillance program. It is not intended to diagnose MRSA infection nor to guide or monitor treatment for MRSA infections.      Studies:   Recent x-ray studies have been reviewed in detail by the Attending Physician  Scheduled Meds:  Scheduled Meds: . aspirin EC  81 mg Oral Daily  . collagenase   Topical Daily  . feeding supplement (ENSURE ENLIVE)  237 mL Oral BID BM  . feeding supplement (PRO-STAT SUGAR FREE 64)  30 mL Oral BID  . levothyroxine  37.5 mcg Oral QAC breakfast  . metoprolol tartrate  12.5 mg Oral BID  . multivitamin with minerals  1 tablet Oral q morning - 10a  . piperacillin-tazobactam (ZOSYN)  IV  3.375 g Intravenous 3 times per day  . warfarin  3 mg Oral ONCE-1800  . Warfarin - Pharmacist Dosing Inpatient   Does not apply q1800    Time spent on care of this patient: 25 mins   Alba Coryegalado, Belkys A , MD  [phone] [facility] Office  [phone] Pager - Text Page per Amion as per below:  On-Call/Text Page:      [email or web address]      password [email or web address]  If 7PM-7AM, please contact night-coverage [email or web address] Password [email or web address] 07/02/2015, 12:05 PM   LOS: 6 days

## 2015-07-02 NOTE — Care Management Important Message (Signed)
Important Message  Patient Details  Name: Kristina Diaz MRN: [id number] Date of Birth: 08/08/1929   Medicare Important Message Given:  Yes    Bernadette HoitShoffner, [name] 07/02/2015, 8:29 AM

## 2015-07-02 NOTE — Progress Notes (Signed)
CSW contacted facility to verify patient is a resident. Per facility representative Marylene Landngela, patient is a resident and there are no concerns regarding the patient returning once medically stable and ready for discharge. CSW informed Marylene Landngela of PT current recommendation for SNF. Per Marylene LandAngela, we are not able to provide 24 hour assistance/ supervision. Marylene Landngela advised me to contact patient's son regarding PT recommendations to inform, stating "he needs to be aware that we can't provide 24 hour care".   CSW contacted son [name] to inform. Per [name], he would like for the patient to return back to her ALF. Son reports she gets great care at ALF, and she has a button she can press at anytime for assistance. At this time, he is not interested in SNF placement for the patient.   FL2 complete and sent to facility. CSW will continue to follow and provide support to patient while in hospital.   Fernande BoydenJoyce Jannatul Wojdyla, [facility] CampusCSWA Clinical Social Worker [facility] Ph: [phone]

## 2015-07-03 DIAGNOSIS — I482 Chronic atrial fibrillation: Secondary | ICD-10-CM

## 2015-07-03 DIAGNOSIS — A419 Sepsis, unspecified organism: Principal | ICD-10-CM

## 2015-07-03 DIAGNOSIS — I739 Peripheral vascular disease, unspecified: Secondary | ICD-10-CM

## 2015-07-03 LAB — BASIC METABOLIC PANEL
ANION GAP: 8 (ref 5–15)
BUN: 12 mg/dL (ref 6–20)
CALCIUM: 8.4 mg/dL — AB (ref 8.9–10.3)
CO2: 25 mmol/L (ref 22–32)
Chloride: 108 mmol/L (ref 101–111)
Creatinine, Ser: 1.09 mg/dL — ABNORMAL HIGH (ref 0.44–1.00)
GFR calc Af Amer: 53 mL/min — ABNORMAL LOW (ref >60)
GFR calc non Af Amer: 46 mL/min — ABNORMAL LOW (ref >60)
GLUCOSE: 95 mg/dL (ref 65–99)
POTASSIUM: 4.5 mmol/L (ref 3.5–5.1)
SODIUM: 141 mmol/L (ref 135–145)

## 2015-07-03 LAB — CBC
HEMATOCRIT: 27.6 % — AB (ref 36.0–46.0)
HEMOGLOBIN: 8.6 g/dL — AB (ref 12.0–15.0)
MCH: 29.4 pg (ref 26.0–34.0)
MCHC: 31.2 g/dL (ref 30.0–36.0)
MCV: 94.2 fL (ref 78.0–100.0)
Platelets: 134 10*3/uL — ABNORMAL LOW (ref 150–400)
RBC: 2.93 MIL/uL — ABNORMAL LOW (ref 3.87–5.11)
RDW: 21.3 % — ABNORMAL HIGH (ref 11.5–15.5)
WBC: 5.4 10*3/uL (ref 4.0–10.5)

## 2015-07-03 LAB — PROTIME-INR
INR: 2.67 — AB (ref 0.00–1.49)
PROTHROMBIN TIME: 28 s — AB (ref 11.6–15.2)

## 2015-07-03 MED ORDER — WARFARIN SODIUM 3 MG PO TABS
3.0000 mg | ORAL_TABLET | Freq: Every day | ORAL | Status: DC
  Administered 2015-07-03: 3 mg via ORAL
  Filled 2015-07-03: qty 1

## 2015-07-03 MED ORDER — FUROSEMIDE 10 MG/ML IJ SOLN
40.0000 mg | Freq: Once | INTRAMUSCULAR | Status: AC
  Administered 2015-07-03: 40 mg via INTRAVENOUS
  Filled 2015-07-03: qty 4

## 2015-07-03 MED ORDER — DOXYCYCLINE HYCLATE 100 MG PO TABS
100.0000 mg | ORAL_TABLET | Freq: Two times a day (BID) | ORAL | Status: DC
  Administered 2015-07-03 – 2015-07-07 (×8): 100 mg via ORAL
  Filled 2015-07-03 (×8): qty 1

## 2015-07-03 MED ORDER — ALBUMIN HUMAN 25 % IV SOLN
12.5000 g | Freq: Once | INTRAVENOUS | Status: AC
  Administered 2015-07-03: 12.5 g via INTRAVENOUS
  Filled 2015-07-03: qty 50

## 2015-07-03 MED ORDER — WARFARIN SODIUM 3 MG PO TABS: 3.0000 mg | ORAL_TABLET | Freq: Once | ORAL | Status: DC

## 2015-07-03 NOTE — Progress Notes (Signed)
ANTICOAGULATION CONSULT NOTE - Follow Up Consult  Pharmacy Consult for warfarin Indication: atrial fibrillation and hx DVT  Allergies  Allergen Reactions  . Bactrim Ds [Sulfamethoxazole-Trimethoprim] Rash  . Contrast Media [Iodinated Diagnostic Agents] Hives    BLE erythema   . Ciprofloxacin Rash  . Tape Rash    Patient Measurements: Height: 5\' 4"  (162.6 cm) Weight: 134 lb (60.782 kg) IBW/kg (Calculated) : 54.7  Vital Signs: Temp: 98.6 F (37 C) (03/08 0608) Temp Source: Oral (03/08 16100608) BP: 102/53 mmHg (03/08 0608) Pulse Rate: 78 (03/08 0608)  Labs:  Recent Labs  07/01/15 0429 07/02/15 0456 07/03/15 0440  HGB 9.7* 8.2* 8.6*  HCT 31.6* 26.5* 27.6*  PLT 152 125* 134*  LABPROT 27.4* 27.1* 28.0*  INR 2.58* 2.55* 2.67*  CREATININE 1.08*  --  1.09*    Estimated Creatinine Clearance: 33.8 mL/min (by C-G formula based on Cr of 1.09).   Assessment: 82 y/o female on chronic warfarin for Afib/hx DVTadmitted with sepsis from LLE cellulitis. Pharmacy consulted to manage warfarin. INR 2.67 today  PTA dose of 4.5 mg daily EXCEPT for 3 mg on Tues/Fri per clinic note. Admit INR was 5.31.   Goal of Therapy:  INR 2-3 Monitor platelets by anticoagulation protocol: Yes   Plan:  Coumadin 3mg  PO daily Monitor daily INR, CBC, s/s of bleed  Bayard HuggerMei Welda Azzarello, PharmD, BCPS  Clinical Pharmacist  Pager: [phone]   07/03/2015 8:44 AM

## 2015-07-03 NOTE — Progress Notes (Signed)
[facility] TEAM 1 - Stepdown/ICU TEAM PROGRESS NOTE  Martyna Thorns ZOX:[id number] DOB: 1930-03-10 DOA: 06/26/2015 PCP: [name], MD  Admit HPI / Brief Narrative: 82 y.o. F Hxatrial fibrillation, PVD, systolic heart failure (EF 45 to 50%), LLE DVT, and chronic left leg wound who presented with fever.  The patient has a history of nonhealing ulcers of the left lower extremity and underwent left popliteal and tibial revascularization with PTA of the popliteal, anterior tibial, and peroneal arteries by Dr. Loreta Ave w/ IR on 05/09/2015. Postprocedure, she had a fall and was found to have a hematoma at the left SFA access which resulted in an drop in her hemoglobin. She was admitted at that time and received blood transfusions. She was also admitted 2/13 for fever and infection LE, worsening drainage from wound. She was treated with IV antibiotics and underwent an unremarkable venogram, with ultimate discharge to a skilled nursing facility. She returned to the hospital on 06/26/15 with fevers, persistent LE redness, and drainage. Evaluation revealed SBP in the 80s, and lactic acid 2.6. Xray LE noted new and increased periosteal reaction involving distal left Femur. WBC 19. Cr 2.5  HPI/Subjective: Reported persistent pain in left posterior leg, pain is better on left lateral side of her left leg    Assessment/Plan:  Severe Sepsis w/ hypothermia and hypotension on admission due to LE cellulitis infection, concern for osteomyelitis, MRI on 3/7 no osteo. sepsis has improved - cont broad empiric abx coverage - appears hemodynamically stable at present - baseline SBP reportedly 100-110   LE cellulitis chronic wound infection - PAD -left popliteal and tibial revascularization with PTA of the popliteal, anterior tibial, and peroneal arteries by IR 05/09/2015 -Venogram 06/09/15 with patent vessels / no stricture  -Left tib-fib CT on 3/1 negative for abscess -L leg plain film concerning for distal L  femur osteo v/s findings related to PVD. MRI on 3/8 no osteo, but ? myositis - the only apparent tx option beyond conservative abx tx at this time appears to be amputation - discussed this w/ the pt, who would like to give abx a try at cure or at least suppression prior to considering evaluation for an amputation  -poor nutrition also contribute to unhealing wounds, will try albumin/lasix to reduce edema. Case discussed  with infectious disease Dr Ninetta Lights over the phone, recommended change to oral doxy x3weeks, continue follow up at wound care center, una boots/hyperbaric chamber if indicated,  patient reports she does not want to have amputation, but she is tired of dealing with the chronic leg wound, tired of being hospitalized frequently, not able to spent time with her husband who is in the same facility with her, she agreed with talking to palliative care for goal of care.  Malnutrition: on nutrition supplement,    Acute Renal failure due to sepsis / hypotension - crt improving steadily  Repeat labs in am.   A fib continue coumadin - rate control improved - tx options limited by chronic hypotension - follow on tele - INR drifting back down toward therapeutic range slowly    Chronic Systolic CHF / Dilated Cardiomyopathy no evidence of signif volume overload at this time - +~7.3L since admit - baseline weight of late appears to be ~67kg - still well below this weight  Weight today at 61.   Filed Weights   07/02/15 0022 07/02/15 0457 07/03/15 0608  Weight: 61.236 kg (135 lb) 61.236 kg (135 lb) 60.782 kg (134 lb)   Hypothyroidism Cont synthroid  Supratherapeutic INR resume warfarin when INR closer to goal - Pharmacy to follow   Hypokalemia replaced   Hypomagnesemia replaced   Code Status: DNR Family Communication: no family present at time of exam Disposition Plan: transfer to tele bed - MRI of leg - back to SNF eventually   Consultants: Infectious Dr hatcher over the phone on  3/8  Procedures: none  Antibiotics: Zosyn 3/1 > 3/8 Vancomycin 3/1 > 3/3 Doxycycline from 3/8  DVT prophylaxis: Warfarin   Objective: Blood pressure 113/55, pulse 97, temperature 98.1 F (36.7 C), temperature source Oral, resp. rate 22, height 5\' 4"  (1.626 m), weight 60.782 kg (134 lb), SpO2 98 %.  Intake/Output Summary (Last 24 hours) at 07/03/15 1524 Last data filed at 07/03/15 1300  Gross per 24 hour  Intake    690 ml  Output    351 ml  Net    339 ml   Exam: General: No acute respiratory distress - alert and pleasant  Lungs: Clear to auscultation bilaterally - no wheezes or crackles  Cardiovascular: regular rate - no gallup   Abdomen: Nontender, nondistended, soft, bowel sounds positive, no rebound Extremities: L LE dressed - continues to weep serous type fluid - no cyanosis of feet B  Data Reviewed:  Basic Metabolic Panel:  Recent Labs Lab 06/27/15 0847 06/28/15 0345 06/29/15 0230 06/30/15 0250 07/01/15 0429 07/03/15 0440  NA 141 139 141 141 143 141  K 3.4* 4.1 4.3 4.5 4.8 4.5  CL 106 106 108 108 109 108  CO2 24 23 25 25 26 25   GLUCOSE 101* 117* 83 80 96 95  BUN 37* 38* 29* 20 16 12   CREATININE 1.76* 1.48* 1.22* 1.01* 1.08* 1.09*  CALCIUM 7.9* 8.2* 8.5* 8.6* 8.6* 8.4*  MG 1.8 2.1  --   --   --   --     CBC:  Recent Labs Lab 06/28/15 0345 06/29/15 0230 06/30/15 0250 07/01/15 0429 07/02/15 0456 07/03/15 0440  WBC 12.9* 7.5 6.5 6.2 4.7 5.4  NEUTROABS 11.3*  --   --   --   --   --   HGB 8.6* 9.3* 9.4* 9.7* 8.2* 8.6*  HCT 27.4* 29.7* 30.6* 31.6* 26.5* 27.6*  MCV 91.3 92.5 93.3 94.0 93.6 94.2  PLT 115* 127* 133* 152 125* 134*    Liver Function Tests:  Recent Labs Lab 06/27/15 0847 06/28/15 0345 06/29/15 0230 07/01/15 0429  AST 50* 36 30 32  ALT 37 32 28 24  ALKPHOS 387* 324* 285* 259*  BILITOT 0.9 0.6 0.6 0.7  PROT 5.6* 5.8* 5.6* 6.5  ALBUMIN 2.0* 1.9* 1.8* 2.0*   Coags:  Recent Labs Lab 06/29/15 0230 06/30/15 0250  07/01/15 0429 07/02/15 0456 07/03/15 0440  INR 4.36* 3.16* 2.58* 2.55* 2.67*    Recent Labs Lab 06/26/15 1834  APTT 57*    Recent Results (from the past 240 hour(s))  Blood Culture (routine x 2)     Status: None   Collection Time: 06/26/15 11:50 AM  Result Value Ref Range Status   Specimen Description BLOOD RIGHT FOREARM  Final   Special Requests BOTTLES DRAWN AEROBIC AND ANAEROBIC 3MLS  Final   Culture NO GROWTH 5 DAYS  Final   Report Status 07/01/2015 FINAL  Final  Blood Culture (routine x 2)     Status: None   Collection Time: 06/26/15 12:10 PM  Result Value Ref Range Status   Specimen Description BLOOD LEFT HAND  Final   Special Requests BOTTLES DRAWN AEROBIC ONLY 5CCS  Final   Culture NO GROWTH 5 DAYS  Final   Report Status 07/01/2015 FINAL  Final  Urine culture     Status: None   Collection Time: 06/26/15  7:56 PM  Result Value Ref Range Status   Specimen Description URINE, RANDOM  Final   Special Requests NONE  Final   Culture NO GROWTH 2 DAYS  Final   Report Status 06/28/2015 FINAL  Final  MRSA PCR Screening     Status: None   Collection Time: 06/26/15  9:49 PM  Result Value Ref Range Status   MRSA by PCR NEGATIVE NEGATIVE Final    Comment:        The GeneXpert MRSA Assay (FDA approved for NASAL specimens only), is one component of a comprehensive MRSA colonization surveillance program. It is not intended to diagnose MRSA infection nor to guide or monitor treatment for MRSA infections.      Studies:   Recent x-ray studies have been reviewed in detail by the Attending Physician  Scheduled Meds:  Scheduled Meds: . albumin human  12.5 g Intravenous Once  . aspirin EC  81 mg Oral Daily  . collagenase   Topical Daily  . feeding supplement (ENSURE ENLIVE)  237 mL Oral BID BM  . feeding supplement (PRO-STAT SUGAR FREE 64)  30 mL Oral BID  . levothyroxine  37.5 mcg Oral QAC breakfast  . metoprolol tartrate  12.5 mg Oral BID  . multivitamin with  minerals  1 tablet Oral q morning - 10a  . piperacillin-tazobactam (ZOSYN)  IV  3.375 g Intravenous 3 times per day  . vancomycin  750 mg Intravenous Q24H  . warfarin  3 mg Oral q1800  . Warfarin - Pharmacist Dosing Inpatient   Does not apply q1800    Time spent on care of this patient: 25 mins   Nikola Marone , MD PhD [phone] [facility] Office  [phone] Pager - Text Page per Amion as per below:  On-Call/Text Page:      [email or web address]      password [email or web address]  If 7PM-7AM, please contact night-coverage [email or web address] Password [email or web address] 07/03/2015, 3:24 PM   LOS: 7 days

## 2015-07-04 DIAGNOSIS — Z515 Encounter for palliative care: Secondary | ICD-10-CM

## 2015-07-04 DIAGNOSIS — I5022 Chronic systolic (congestive) heart failure: Secondary | ICD-10-CM

## 2015-07-04 DIAGNOSIS — I481 Persistent atrial fibrillation: Secondary | ICD-10-CM

## 2015-07-04 DIAGNOSIS — R531 Weakness: Secondary | ICD-10-CM

## 2015-07-04 LAB — BASIC METABOLIC PANEL
Anion gap: 8 (ref 5–15)
BUN: 12 mg/dL (ref 6–20)
CHLORIDE: 106 mmol/L (ref 101–111)
CO2: 26 mmol/L (ref 22–32)
CREATININE: 1.11 mg/dL — AB (ref 0.44–1.00)
Calcium: 8 mg/dL — ABNORMAL LOW (ref 8.9–10.3)
GFR calc Af Amer: 52 mL/min — ABNORMAL LOW (ref >60)
GFR calc non Af Amer: 45 mL/min — ABNORMAL LOW (ref >60)
GLUCOSE: 81 mg/dL (ref 65–99)
Potassium: 3.6 mmol/L (ref 3.5–5.1)
SODIUM: 140 mmol/L (ref 135–145)

## 2015-07-04 LAB — SEDIMENTATION RATE: Sed Rate: 40 mm/hr — ABNORMAL HIGH (ref 0–22)

## 2015-07-04 LAB — PROTIME-INR
INR: 2.85 — ABNORMAL HIGH (ref 0.00–1.49)
Prothrombin Time: 29.4 seconds — ABNORMAL HIGH (ref 11.6–15.2)

## 2015-07-04 LAB — CK: CK TOTAL: 44 U/L (ref 38–234)

## 2015-07-04 LAB — C-REACTIVE PROTEIN: CRP: 4.3 mg/dL — ABNORMAL HIGH (ref <1.0)

## 2015-07-04 MED ORDER — POLYETHYLENE GLYCOL 3350 17 G PO PACK
17.0000 g | PACK | Freq: Every day | ORAL | Status: DC
  Administered 2015-07-04 – 2015-07-06 (×2): 17 g via ORAL
  Filled 2015-07-04 (×4): qty 1

## 2015-07-04 MED ORDER — WARFARIN SODIUM 2 MG PO TABS
2.0000 mg | ORAL_TABLET | Freq: Every day | ORAL | Status: DC
  Administered 2015-07-04 – 2015-07-06 (×3): 2 mg via ORAL
  Filled 2015-07-04 (×3): qty 1

## 2015-07-04 MED ORDER — FUROSEMIDE 10 MG/ML IJ SOLN
40.0000 mg | Freq: Once | INTRAMUSCULAR | Status: AC
  Administered 2015-07-04: 40 mg via INTRAVENOUS
  Filled 2015-07-04: qty 4

## 2015-07-04 MED ORDER — ALBUMIN HUMAN 25 % IV SOLN
12.5000 g | Freq: Once | INTRAVENOUS | Status: AC
  Administered 2015-07-04: 12.5 g via INTRAVENOUS
  Filled 2015-07-04: qty 50

## 2015-07-04 MED ORDER — POTASSIUM CHLORIDE CRYS ER 20 MEQ PO TBCR
40.0000 meq | EXTENDED_RELEASE_TABLET | Freq: Once | ORAL | Status: AC
  Administered 2015-07-04: 40 meq via ORAL
  Filled 2015-07-04: qty 2

## 2015-07-04 MED ORDER — LEVOTHYROXINE SODIUM 75 MCG PO TABS
37.5000 ug | ORAL_TABLET | Freq: Every day | ORAL | Status: DC
  Administered 2015-07-04 – 2015-07-07 (×4): 37.5 ug via ORAL
  Filled 2015-07-04 (×4): qty 1

## 2015-07-04 NOTE — Consult Note (Signed)
Consultation Note Date: 07/04/2015   Patient Name: Kristina Diaz  DOB: 1929/05/09  MRN: [id number]  Age / Sex: 82 y.o., female  PCP: Gordy Savers, MD Referring Physician: Albertine Grates, MD  Reason for Consultation: Establishing goals of care and Psychosocial/spiritual support  Clinical Assessment/Narrative:  82 y.o. F Hxatrial fibrillation, PVD, systolic heart failure (EF 45 to 50%), LLE DVT, and chronic left leg wound who presented with fever.  The patient has a history of nonhealing ulcers of the left lower extremity and underwent left popliteal and tibial revascularization with PTA of the popliteal, anterior tibial, and peroneal arteries by Dr. Loreta Ave w/ IR on 05/09/2015.  Postprocedure, she had a fall and was found to have a hematoma at the left SFA access which resulted in an drop in her hemoglobin. She was admitted at that time and received blood transfusions. She was also admitted 2/13 for fever and infection LE, worsening drainage from wound. She was treated with IV antibiotics and underwent an unremarkable venogram, with ultimate discharge to a skilled nursing facility. She returned to the hospital on 06/26/15 with fevers, persistent LE redness, and drainage.   Per patient she has had continues slow physical and functional decline over the past several years,  "It is very frustrating".  She continues to hope for improvement but understands her body "is giving out" and limitations of medical interventions to change long term outcome.    This NP Lorinda Creed reviewed medical records, received report from team, assessed the patient and then meet at the patient's bedside along with her son/ Kristina Diaz   to discuss diagnosis, prognosis, GOC, EOL wishes disposition and options.   A detailed discussion was had today regarding advanced directives.  Concepts specific to code status, artifical feeding and hydration,  continued IV antibiotics and rehospitalization was had.  The difference between a aggressive medical intervention path  and a palliative comfort care path for this patient at this time was had.  Values and goals of care important to patient and family were attempted to be elicited.  Concept of Hospice and Palliative Care were discussed.  MOST form introduced  Natural trajectory and expectations in an overall failure to thrive in light of multiple co-morbidites was discussed.  Questions and concerns addressed.   Family encouraged to call with questions or concerns.  PMT will continue to support holistically.   Contacts/Participants in Discussion:  Shelisha Gautier HCPOA: yes    SUMMARY OF RECOMMENDATIONS  - treat the treatable and when medically stable discharge back to [facility] ( patient and family understand the limitations that living in an AL/[place] but they are willing to accept situation) they will "make adjustments"   -initially patient wishes to utilize home health benefit, educated on hospice benefit and self referral    Code Status/Advance Care Planning:   DNR       Code Status Orders        Start     Ordered   06/27/15 2004  Do not attempt resuscitation (DNR)   Continuous    Question Answer Comment  In the event of cardiac or respiratory ARREST Do not call a "code blue"   In the event of cardiac or respiratory ARREST Do not perform Intubation, CPR, defibrillation or ACLS   In the event of cardiac or respiratory ARREST Use medication by any route, position, wound care, and other measures to relive pain and suffering. May use oxygen, suction and manual treatment of airway obstruction as needed for  comfort.      06/27/15 2003    Code Status History    Date Active Date Inactive Code Status Order ID Comments User Context   06/26/2015  9:56 PM 06/27/2015  8:03 PM Partial Code [id number]  Alba Cory, MD Inpatient   06/11/2015  1:39 AM 06/20/2015  7:35  PM DNR [id number]  Renae Fickle, MD ED   05/11/2015 11:37 AM 05/13/2015  5:46 PM DNR [id number]  Edsel Petrin, DO Inpatient   05/10/2015 10:29 AM 05/11/2015 11:37 AM Full Code [id number]  Elson Areas, PA-C ED   01/23/2015 11:44 AM 01/28/2015  7:57 PM Full Code [id number]  Richarda Overlie, MD ED   08/04/2012 11:53 AM 08/11/2012  6:10 PM Full Code [id number]  Beatrice Lecher, PA-C Inpatient    Advance Directive Documentation        Most Recent Value   Type of Advance Directive  Out of facility DNR (pink MOST or yellow form)   Pre-existing out of facility DNR order (yellow form or pink MOST form)  Yellow form placed in chart (order not valid for inpatient use)   "MOST" Form in Place?        Other Directives:Advanced Directive and Living Will  Symptom Management:   Weakness- continue to work with physical therapy, hopeful for Home PT at [facility] when discharged  Palliative Prophylaxis:   Aspiration, Bowel Regimen, Delirium Protocol, Frequent Pain Assessment and Turn Reposition  Additional Recommendations (Limitations, Scope, Preferences):  Avoid Hospitalization   Psycho-social/Spiritual:  Support System: Strong Desire for further Chaplaincy support:no Additional Recommendations: Education on Hospice   Discharge Planning:     Patient and her family "see no other option" than to return to her [facility].  We discussed the limitations that this presents, and family accepts the situation as it is  Chief Complaint/ Primary Diagnoses: Present on Admission:  . Sepsis (HCC) . PAD (peripheral artery disease) (HCC) . Atrial fibrillation (HCC) . Hypothyroidism . Wound of lower extremity . Cellulitis of left lower extremity . Acute renal failure (ARF) (HCC) . Cellulitis of left lower leg . Fever . Wound infection (HCC) . Chronic systolic heart failure (HCC) . Dilated cardiomyopathy (HCC) . Supratherapeutic INR  I have reviewed the medical record, interviewed the  patient and family, and examined the patient. The following aspects are pertinent.  Past Medical History  Diagnosis Date  . PAD (peripheral artery disease) (HCC) 2013    left leg. sounds like she had angio-plasty   . Atrial fibrillation (HCC) 2013    s/p [facility] that failed. ON medication  . Hypertension   . Osteoporosis   . DVT (deep venous thrombosis) (HCC) 2013    "LLE" (08/04/2012)  . Hypothyroidism   . History of blood transfusion 2013    "related to thrombectomy" (08/04/2012)  . Anemia     "slightly" (08/04/2012)  . Arthritis     "probably minor; knees, pinky" (08/04/2012)  . Fracture of L4 vertebra (HCC)     "dx'd in 2013; can't treat til legs fixed" (08/04/2012)  . Lower extremity ulceration (HCC)   . Biventricular heart failure with reduced left ventricular function (HCC)    Social History   Social History  . Marital Status: Married    Spouse Name: N/A  . Number of Children: 2  . Years of Education: 13   Occupational History  . retired    Social History Main Topics  . Smoking status: Never Smoker   . Smokeless tobacco: Never Used  .  Alcohol Use: 0.0 oz/week     Comment: 08/04/2012 "used to have 1-2 glasses of wine/wk; nothing for the past 1 yr"  . Drug Use: No  . Sexual Activity: No   Other Topics Concern  . None   Social History Narrative   HSG. 1 year of college. Married: 1959: 2 sons - '63, '70: 3 grandchildren. Work - Animal nutritionist. Have been living with husband in [place]. ACP/Living will - HCPOA both sons. Recommended going to [email or web address].                   Family History  Problem Relation Age of Onset  . Arthritis Mother   . Cancer Mother     breast cancer   . Cancer Brother     lung cancer  . Tuberculosis Father   . Cancer Sister     stg 4 breast cancer  . Diabetes Neg Hx   . Heart disease Neg Hx   . COPD Neg Hx    Scheduled Meds: . aspirin EC  81 mg Oral Daily  . collagenase   Topical Daily  . doxycycline  100  mg Oral Q12H  . feeding supplement (ENSURE ENLIVE)  237 mL Oral BID BM  . feeding supplement (PRO-STAT SUGAR FREE 64)  30 mL Oral BID  . levothyroxine  37.5 mcg Oral QAC breakfast  . metoprolol tartrate  12.5 mg Oral BID  . multivitamin with minerals  1 tablet Oral q morning - 10a  . warfarin  3 mg Oral q1800  . Warfarin - Pharmacist Dosing Inpatient   Does not apply q1800   Continuous Infusions: . sodium chloride 10 mL/hr at 07/01/15 1631   PRN Meds:.acetaminophen **OR** acetaminophen, morphine injection, ondansetron **OR** ondansetron (ZOFRAN) IV, oxyCODONE Medications Prior to Admission:  Prior to Admission medications   Medication Sig Start Date End Date Taking? Authorizing Provider  Amino Acids-Protein Hydrolys (FEEDING SUPPLEMENT, PRO-STAT SUGAR FREE 64,) LIQD Take 30 mLs by mouth 2 (two) times daily.   Yes Historical Provider, MD  aspirin EC 81 MG EC tablet Take 1 tablet (81 mg total) by mouth daily. 05/13/15  Yes Maryann Mikhail, DO  docusate sodium (COLACE) 100 MG capsule Take 100 mg by mouth daily.   Yes Historical Provider, MD  feeding supplement, ENSURE ENLIVE, (ENSURE ENLIVE) LIQD Take 237 mLs by mouth 2 (two) times daily between meals. 06/20/15  Yes Meredeth Ide, MD  levothyroxine (SYNTHROID, LEVOTHROID) 25 MCG tablet TAKE 1 AND 1/2 TABS (37.5MG ) BY MOUTH EVERY DAY*CHECK PULSE WEEKLY* 07/14/12  Yes Jacques Navy, MD  metoprolol succinate (TOPROL-XL) 25 MG 24 hr tablet Take 0.5 tablets (12.5 mg total) by mouth daily. 11/01/12  Yes Jacques Navy, MD  Multiple Vitamin (MULTIVITAMIN WITH MINERALS) TABS Take 1 tablet by mouth every morning.   Yes Historical Provider, MD  oxyCODONE (OXY IR/ROXICODONE) 5 MG immediate release tablet Take 1 tablet (5 mg total) by mouth every 6 (six) hours as needed for moderate pain or severe pain. 06/20/15  Yes Meredeth Ide, MD  oxyCODONE (OXYCONTIN) 10 mg 12 hr tablet Take 1 tablet (10 mg total) by mouth every 12 (twelve) hours. 06/20/15  Yes Meredeth Ide, MD  torsemide (DEMADEX) 20 MG tablet Take 20 mg by mouth daily. Reported on 06/10/2015   Yes Historical Provider, MD  warfarin (COUMADIN) 3 MG tablet Take 4.5mg  (one and a half pills) everyday except Tuesday and Friday. Take  (one pill) on Tuesday and Friday. 05/14/15  Yes Maryann Mikhail, DO  warfarin (COUMADIN) 3 MG tablet Take 4.5 mg by mouth See admin instructions. Pt takes Su,M,Wed,Th,Sa   Yes Historical Provider, MD  enoxaparin (LOVENOX) 80 MG/0.8ML injection Inject 0.7 mLs (70 mg total) into the skin daily. Patient not taking: Reported on 06/26/2015 06/20/15 06/27/15  Meredeth Ide, MD   Allergies  Allergen Reactions  . Bactrim Ds [Sulfamethoxazole-Trimethoprim] Rash  . Contrast Media [Iodinated Diagnostic Agents] Hives    BLE erythema   . Ciprofloxacin Rash  . Tape Rash    Review of Systems  Constitutional: Positive for activity change, appetite change and fatigue.  Skin: Positive for wound.    Physical Exam  Constitutional: She is oriented to person, place, and time. She appears cachectic. She appears ill.  HENT:  Mouth/Throat: Mucous membranes are normal.  Cardiovascular: Normal rate, regular rhythm and normal heart sounds.   Respiratory: Breath sounds normal.  GI: Soft. Normal appearance.  Neurological: She is alert and oriented to person, place, and time.  Skin: Skin is warm and dry.    Vital Signs: BP 94/45 mmHg  Pulse 89  Temp(Src) 98 F (36.7 C) (Oral)  Resp 20  Ht 5\' 4"  (1.626 m)  Wt 58.06 kg (128 lb)  BMI 21.96 kg/m2  SpO2 94%  SpO2: SpO2: 94 % O2 Device:SpO2: 94 % O2 Flow Rate: .O2 Flow Rate (L/min): 0 L/min  IO: Intake/output summary:  Intake/Output Summary (Last 24 hours) at 07/04/15 0844 Last data filed at 07/04/15 0612  Gross per 24 hour  Intake   1080 ml  Output    401 ml  Net    679 ml    LBM: Last BM Date: 07/03/15 Baseline Weight: Weight: 67.586 kg (149 lb) Most recent weight: Weight: 58.06 kg (128 lb)      Palliative  Assessment/Data:  Flowsheet Rows        Most Recent Value   Intake Tab    Referral Department  Hospitalist   Unit at Time of Referral  Cardiac/Telemetry Unit   Palliative Care Primary Diagnosis  Cardiac   Date Notified  07/03/15   Palliative Care Type  New Palliative care   Reason for referral  Clarify Goals of Care   Date of Admission  06/26/15   # of days IP prior to Palliative referral  7   Clinical Assessment    Psychosocial & Spiritual Assessment    Palliative Care Outcomes       Additional Data Reviewed:  CBC:    Component Value Date/Time   WBC 5.4 07/03/2015 0440   HGB 8.6* 07/03/2015 0440   HCT 27.6* 07/03/2015 0440   PLT 134* 07/03/2015 0440   MCV 94.2 07/03/2015 0440   NEUTROABS 11.3* 06/28/2015 0345   LYMPHSABS 0.6* 06/28/2015 0345   MONOABS 1.0 06/28/2015 0345   EOSABS 0.0 06/28/2015 0345   BASOSABS 0.0 06/28/2015 0345   Comprehensive Metabolic Panel:    Component Value Date/Time   NA 140 07/04/2015 0516   K 3.6 07/04/2015 0516   CL 106 07/04/2015 0516   CO2 26 07/04/2015 0516   BUN 12 07/04/2015 0516   CREATININE 1.11* 07/04/2015 0516   GLUCOSE 81 07/04/2015 0516   CALCIUM 8.0* 07/04/2015 0516   AST 32 07/01/2015 0429   ALT 24 07/01/2015 0429   ALKPHOS 259* 07/01/2015 0429   BILITOT 0.7 07/01/2015 0429   PROT 6.5 07/01/2015 0429   ALBUMIN 2.0* 07/01/2015 0429   Discussed with Dr Roda Shutters  Time In: 1000 Time Out:  1115 Time Total: 75 min Greater than 50%  of this time was spent counseling and coordinating care related to the above assessment and plan.  Signed by: Lorinda CreedLARACH, Tajai Ihde, NP  Canary BrimMary W Gurdeep Keesey, NP  07/04/2015, 8:44 AM  Please contact Palliative Medicine Team phone at [phone] for questions and concerns.

## 2015-07-04 NOTE — Clinical Documentation Improvement (Signed)
Internal Medicine  Can the diagnosis of Malnutrition be further specified? Please document findings in next progress note NOT in BPA drop down box. Thank you!   Document Severity - Severe(third degree), Moderate (second degree), Mild (first degree)  Other condition  Unable to clinically determine  Document any associated diagnoses/conditions  Supporting Information: :   "Malnutrition/anasarca: on nutrition supplement, Albumin and lasix on 3/8 and 3/9, Will monitor bp and cr level, +9liters since admission, may benefit from a few more days of albumin/lasix".  Patient is 5'4" tall weighing 135 pounds; bmi = 23.2   Please exercise your independent, professional judgment when responding. A specific answer is not anticipated or expected.  Thank You, Shellee MiloEileen T Lauramae Kneisley RN, BSN, CCDS Health Information Management [place] [phone]; Cell: [phone]

## 2015-07-04 NOTE — Progress Notes (Signed)
ANTICOAGULATION CONSULT NOTE - Follow Up Consult  Pharmacy Consult for warfarin Indication: atrial fibrillation and hx DVT  Allergies  Allergen Reactions  . Bactrim Ds [Sulfamethoxazole-Trimethoprim] Rash  . Contrast Media [Iodinated Diagnostic Agents] Hives    BLE erythema   . Ciprofloxacin Rash  . Tape Rash    Patient Measurements: Height: 5\' 4"  (162.6 cm) Weight: 128 lb (58.06 kg) IBW/kg (Calculated) : 54.7  Vital Signs: Temp: 98 F (36.7 C) (03/09 0542) Temp Source: Oral (03/09 0542) BP: 94/45 mmHg (03/09 0542) Pulse Rate: 89 (03/09 0542)  Labs:  Recent Labs  07/02/15 0456 07/03/15 0440 07/04/15 0516  HGB 8.2* 8.6*  --   HCT 26.5* 27.6*  --   PLT 125* 134*  --   LABPROT 27.1* 28.0* 29.4*  INR 2.55* 2.67* 2.85*  CREATININE  --  1.09* 1.11*  CKTOTAL  --   --  44    Estimated Creatinine Clearance: 33.2 mL/min (by C-G formula based on Cr of 1.11).   Assessment: 82 y/o female on chronic warfarin for Afib/hx DVT, admitted with sepsis from LLE cellulitis. Pharmacy consulted to manage warfarin. INR 2.85 today. Antibiotics changed to doxycycline yesterday, plan to treat wound infection for 3 weeks. Doxycycline might decrease coumadin metabolism, therefore might require reduce coumadin dose during the course of therapy.  PTA dose of 4.5 mg daily EXCEPT for 3 mg on Tues/Fri per clinic note. Admit INR was 5.31.   Goal of Therapy:  INR 2-3 Monitor platelets by anticoagulation protocol: Yes   Plan:  Coumadin 2 mg daily Monitor daily INR, CBC, s/s of bleed If discharge, recommend changing dose to 2 mg daily with outpatient INR check in 2-3 days  Bayard HuggerMei Matayah Reyburn, PharmD, BCPS  Clinical Pharmacist  Pager: [phone]  07/04/2015 10:36 AM

## 2015-07-04 NOTE — Progress Notes (Signed)
PT Cancellation Note  Patient Details Name: Kristina Diaz MRN: [id number] DOB: 09/29/1929   Cancelled Treatment:    Reason Eval/Treat Not Completed: Other (comment) (Waiting for wound care and declined therapy).  Try later as time allows.   Ivar DrapeStout, Zaevion Parke E 07/04/2015, 4:42 PM   Samul Dadauth Alizia Greif, PT MS Acute Rehab Dept. Number: [facility] [id number] [phone] and [facility] [phone]

## 2015-07-04 NOTE — Progress Notes (Addendum)
[facility] TEAM 1 - Stepdown/ICU TEAM PROGRESS NOTE  Kristina Diaz ZOX:[id number] DOB: 1930/04/14 DOA: 06/26/2015 PCP: [name], MD  Admit HPI / Brief Narrative: 82 y.o. F Hxatrial fibrillation, PVD, systolic heart failure (EF 45 to 50%), LLE DVT, and chronic left leg wound who presented with fever.  The patient has a history of nonhealing ulcers of the left lower extremity and underwent left popliteal and tibial revascularization with PTA of the popliteal, anterior tibial, and peroneal arteries by Dr. Loreta Ave w/ IR on 05/09/2015. Postprocedure, she had a fall and was found to have a hematoma at the left SFA access which resulted in an drop in her hemoglobin. She was admitted at that time and received blood transfusions. She was also admitted 2/13 for fever and infection LE, worsening drainage from wound. She was treated with IV antibiotics and underwent an unremarkable venogram, with ultimate discharge to a skilled nursing facility. She returned to the hospital on 06/26/15 with fevers, persistent LE redness, and drainage. Evaluation revealed SBP in the 80s, and lactic acid 2.6. Xray LE noted new and increased periosteal reaction involving distal left Femur. WBC 19. Cr 2.5  HPI/Subjective: Reported  pain in left posterior leg better, though persistent weeping, she had new dressing on today,  She report pain on left lateral side of her left leg has resolved,   Assessment/Plan:  Severe Sepsis w/ hypothermia and hypotension on admission due to LE cellulitis infection, MRI on 3/7 no osteomyelitis . sepsis has improved - she received broad empiric abx coverage with vanc and zosyn x8 days since admission, culture unrevealing, case discussed with ID on 3/8, recommended doxycycline x weeks.   LE cellulitis chronic wound infection - PAD -left popliteal and tibial revascularization with PTA of the popliteal, anterior tibial, and peroneal arteries by IR 05/09/2015 -Venogram 06/09/15 with  patent vessels / no stricture  -Left tib-fib CT on 3/1 negative for abscess -L leg plain film concerning for distal L femur osteo v/s findings related to PVD. MRI on 3/8 no osteo, but ? myositis - the only apparent tx option beyond conservative abx tx at this time appears to be amputation - discussed this w/ the pt, who would like to give abx a try at cure or at least suppression prior to considering evaluation for an amputation  -poor nutrition also contribute to unhealing wounds, will try albumin/lasix to reduce edema. Case discussed  with infectious disease Dr Ninetta Lights over the phone, recommended change to oral doxy x3weeks, continue follow up at wound care center, una boots/hyperbaric chamber if indicated,  patient reports she does not want to have amputation, but she is tired of dealing with the chronic leg wound, tired of being hospitalized frequently, not able to spent time with her husband who is in the same facility with her, she agreed with talking to palliative care for goal of care.   Malnutrition/anasarca: Severe malnutrition in context of chronic illness, on nutrition supplement,  Albumin and lasix on 3/8 and 3/9,  Will monitor bp and cr level, +9liters since admission, may benefit from a few more days of albumin/lasix.   Acute Renal failure on CKD III,  due to sepsis / hypotension - crt improving steadily  Cr around 1.1 for 4days, likely close to baseline  A fib, chronic continue coumadin pharmacy to dose- rate control improved - tx options limited by chronic hypotension - follow on tele - INR drifting back down toward therapeutic range slowly    Chronic Systolic CHF / Dilated  Cardiomyopathy, last EF 35% in 05/2015 Continue to have bilateral lower extremity edema, worse on the left, started albumin and lasix. With good diuresis.    Filed Weights   07/02/15 0457 07/03/15 0608 07/04/15 0542  Weight: 61.236 kg (135 lb) 60.782 kg (134 lb) 58.06 kg (128 lb)   Hypothyroidism Cont  synthroid   Supratherapeutic INR, INR 5.8 on admission Pharmacy to dose coumadin, close monitor INR.  Hypokalemia replaced   Hypomagnesemia replaced   Code Status: DNR Family Communication: no family present at time of exam Disposition Plan: transfer to tele bed - back to SNF after a few days of diuresis  Consultants: Infectious Dr hatcher over the phone on 3/8 Palliative care consult  Procedures: none  Antibiotics: Zosyn 3/1 > 3/8 Vancomycin 3/1 > 3/3 Doxycycline from 3/8  DVT prophylaxis: Warfarin   Objective: Blood pressure 97/47, pulse 86, temperature 98.1 F (36.7 C), temperature source Oral, resp. rate 18, height 5\' 4"  (1.626 m), weight 58.06 kg (128 lb), SpO2 98 %.  Intake/Output Summary (Last 24 hours) at 07/04/15 1414 Last data filed at 07/04/15 1300  Gross per 24 hour  Intake    820 ml  Output   1000 ml  Net   -180 ml   Exam: General: No acute respiratory distress - alert and pleasant  Lungs: Clear to auscultation bilaterally - no wheezes or crackles  Cardiovascular: IRRR   Abdomen: Nontender, nondistended, soft, bowel sounds positive, no rebound Extremities: L LE dressed - continues to weep serous type fluid - no cyanosis of feet B  Data Reviewed:  Basic Metabolic Panel:  Recent Labs Lab 06/28/15 0345 06/29/15 0230 06/30/15 0250 07/01/15 0429 07/03/15 0440 07/04/15 0516  NA 139 141 141 143 141 140  K 4.1 4.3 4.5 4.8 4.5 3.6  CL 106 108 108 109 108 106  CO2 23 25 25 26 25 26   GLUCOSE 117* 83 80 96 95 81  BUN 38* 29* 20 16 12 12   CREATININE 1.48* 1.22* 1.01* 1.08* 1.09* 1.11*  CALCIUM 8.2* 8.5* 8.6* 8.6* 8.4* 8.0*  MG 2.1  --   --   --   --   --     CBC:  Recent Labs Lab 06/28/15 0345 06/29/15 0230 06/30/15 0250 07/01/15 0429 07/02/15 0456 07/03/15 0440  WBC 12.9* 7.5 6.5 6.2 4.7 5.4  NEUTROABS 11.3*  --   --   --   --   --   HGB 8.6* 9.3* 9.4* 9.7* 8.2* 8.6*  HCT 27.4* 29.7* 30.6* 31.6* 26.5* 27.6*  MCV 91.3 92.5 93.3 94.0  93.6 94.2  PLT 115* 127* 133* 152 125* 134*    Liver Function Tests:  Recent Labs Lab 06/28/15 0345 06/29/15 0230 07/01/15 0429  AST 36 30 32  ALT 32 28 24  ALKPHOS 324* 285* 259*  BILITOT 0.6 0.6 0.7  PROT 5.8* 5.6* 6.5  ALBUMIN 1.9* 1.8* 2.0*   Coags:  Recent Labs Lab 06/30/15 0250 07/01/15 0429 07/02/15 0456 07/03/15 0440 07/04/15 0516  INR 3.16* 2.58* 2.55* 2.67* 2.85*   No results for input(s): APTT in the last 168 hours.  Recent Results (from the past 240 hour(s))  Blood Culture (routine x 2)     Status: None   Collection Time: 06/26/15 11:50 AM  Result Value Ref Range Status   Specimen Description BLOOD RIGHT FOREARM  Final   Special Requests BOTTLES DRAWN AEROBIC AND ANAEROBIC 3MLS  Final   Culture NO GROWTH 5 DAYS  Final   Report Status  07/01/2015 FINAL  Final  Blood Culture (routine x 2)     Status: None   Collection Time: 06/26/15 12:10 PM  Result Value Ref Range Status   Specimen Description BLOOD LEFT HAND  Final   Special Requests BOTTLES DRAWN AEROBIC ONLY 5CCS  Final   Culture NO GROWTH 5 DAYS  Final   Report Status 07/01/2015 FINAL  Final  Urine culture     Status: None   Collection Time: 06/26/15  7:56 PM  Result Value Ref Range Status   Specimen Description URINE, RANDOM  Final   Special Requests NONE  Final   Culture NO GROWTH 2 DAYS  Final   Report Status 06/28/2015 FINAL  Final  MRSA PCR Screening     Status: None   Collection Time: 06/26/15  9:49 PM  Result Value Ref Range Status   MRSA by PCR NEGATIVE NEGATIVE Final    Comment:        The GeneXpert MRSA Assay (FDA approved for NASAL specimens only), is one component of a comprehensive MRSA colonization surveillance program. It is not intended to diagnose MRSA infection nor to guide or monitor treatment for MRSA infections.      Studies:   Recent x-ray studies have been reviewed in detail by the Attending Physician  Scheduled Meds:  Scheduled Meds: . albumin human   12.5 g Intravenous Once  . aspirin EC  81 mg Oral Daily  . collagenase   Topical Daily  . doxycycline  100 mg Oral Q12H  . feeding supplement (ENSURE ENLIVE)  237 mL Oral BID BM  . feeding supplement (PRO-STAT SUGAR FREE 64)  30 mL Oral BID  . furosemide  40 mg Intravenous Once  . levothyroxine  37.5 mcg Oral QAC breakfast  . metoprolol tartrate  12.5 mg Oral BID  . multivitamin with minerals  1 tablet Oral q morning - 10a  . potassium chloride  40 mEq Oral Once  . warfarin  2 mg Oral q1800  . Warfarin - Pharmacist Dosing Inpatient   Does not apply q1800    Time spent on care of this patient: 25 mins   Armine Rizzolo , MD PhD [phone] [facility] Office  [phone] Pager - Text Page per Amion as per below:  On-Call/Text Page:      [email or web address]      password [email or web address]  If 7PM-7AM, please contact night-coverage [email or web address] Password [email or web address] 07/04/2015, 2:14 PM   LOS: 8 days

## 2015-07-05 DIAGNOSIS — E43 Unspecified severe protein-calorie malnutrition: Secondary | ICD-10-CM

## 2015-07-05 DIAGNOSIS — Z515 Encounter for palliative care: Secondary | ICD-10-CM | POA: Insufficient documentation

## 2015-07-05 DIAGNOSIS — Z66 Do not resuscitate: Secondary | ICD-10-CM | POA: Insufficient documentation

## 2015-07-05 DIAGNOSIS — R531 Weakness: Secondary | ICD-10-CM | POA: Insufficient documentation

## 2015-07-05 LAB — BASIC METABOLIC PANEL
ANION GAP: 9 (ref 5–15)
BUN: 11 mg/dL (ref 6–20)
CO2: 26 mmol/L (ref 22–32)
Calcium: 8.2 mg/dL — ABNORMAL LOW (ref 8.9–10.3)
Chloride: 104 mmol/L (ref 101–111)
Creatinine, Ser: 1 mg/dL (ref 0.44–1.00)
GFR calc Af Amer: 59 mL/min — ABNORMAL LOW (ref >60)
GFR calc non Af Amer: 51 mL/min — ABNORMAL LOW (ref >60)
GLUCOSE: 85 mg/dL (ref 65–99)
POTASSIUM: 3.9 mmol/L (ref 3.5–5.1)
Sodium: 139 mmol/L (ref 135–145)

## 2015-07-05 LAB — CBC
HEMATOCRIT: 26.9 % — AB (ref 36.0–46.0)
Hemoglobin: 8.1 g/dL — ABNORMAL LOW (ref 12.0–15.0)
MCH: 28.5 pg (ref 26.0–34.0)
MCHC: 30.1 g/dL (ref 30.0–36.0)
MCV: 94.7 fL (ref 78.0–100.0)
Platelets: 128 10*3/uL — ABNORMAL LOW (ref 150–400)
RBC: 2.84 MIL/uL — AB (ref 3.87–5.11)
RDW: 21.1 % — ABNORMAL HIGH (ref 11.5–15.5)
WBC: 4.7 10*3/uL (ref 4.0–10.5)

## 2015-07-05 LAB — PROTIME-INR
INR: 2.55 — ABNORMAL HIGH (ref 0.00–1.49)
Prothrombin Time: 27.1 seconds — ABNORMAL HIGH (ref 11.6–15.2)

## 2015-07-05 LAB — MAGNESIUM: Magnesium: 1.6 mg/dL — ABNORMAL LOW (ref 1.7–2.4)

## 2015-07-05 MED ORDER — MAGNESIUM SULFATE 2 GM/50ML IV SOLN
2.0000 g | Freq: Once | INTRAVENOUS | Status: AC
  Administered 2015-07-05: 2 g via INTRAVENOUS
  Filled 2015-07-05: qty 50

## 2015-07-05 MED ORDER — FUROSEMIDE 10 MG/ML IJ SOLN
40.0000 mg | Freq: Once | INTRAMUSCULAR | Status: AC
  Administered 2015-07-05: 40 mg via INTRAVENOUS
  Filled 2015-07-05: qty 4

## 2015-07-05 MED ORDER — ALBUMIN HUMAN 25 % IV SOLN
12.5000 g | Freq: Once | INTRAVENOUS | Status: AC
  Administered 2015-07-05: 12.5 g via INTRAVENOUS
  Filled 2015-07-05: qty 50

## 2015-07-05 NOTE — Progress Notes (Signed)
ANTICOAGULATION CONSULT NOTE - Follow Up Consult  Pharmacy Consult for warfarin Indication: atrial fibrillation and hx DVT  Allergies  Allergen Reactions  . Bactrim Ds [Sulfamethoxazole-Trimethoprim] Rash  . Contrast Media [Iodinated Diagnostic Agents] Hives    BLE erythema   . Ciprofloxacin Rash  . Tape Rash    Patient Measurements: Height: 5\' 4"  (162.6 cm) Weight: 121 lb (54.885 kg) IBW/kg (Calculated) : 54.7  Vital Signs: Temp: 98.3 F (36.8 C) (03/10 0438) Temp Source: Oral (03/10 0438) BP: 99/57 mmHg (03/10 0438) Pulse Rate: 84 (03/10 0438)  Labs:  Recent Labs  07/03/15 0440 07/04/15 0516 07/05/15 0420  HGB 8.6*  --  8.1*  HCT 27.6*  --  26.9*  PLT 134*  --  128*  LABPROT 28.0* 29.4* 27.1*  INR 2.67* 2.85* 2.55*  CREATININE 1.09* 1.11* 1.00  CKTOTAL  --  44  --     Estimated Creatinine Clearance: 36.8 mL/min (by C-G formula based on Cr of 1).   Assessment: 82 y/o female on chronic warfarin for Afib/hx DVT, admitted with sepsis from LLE cellulitis. Pharmacy consulted to manage warfarin. Antibiotics changed to doxycycline yesterday, plan to treat wound infection for 3 weeks. Doxycycline might decrease coumadin metabolism, therefore might require reduce coumadin dose during the course of therapy.  INR 2.55 today. hgb 8.1, pltc 128K, low stable  PTA dose of 4.5 mg daily EXCEPT for 3 mg on Tues/Fri per clinic note. Admit INR was 5.31.   Goal of Therapy:  INR 2-3 Monitor platelets by anticoagulation protocol: Yes   Plan:  Coumadin 2 mg daily Monitor daily INR, CBC, s/s of bleed If discharge, recommend changing dose to 2 mg daily with outpatient INR check in 2-3 days  Bayard HuggerMei Markiya Keefe, PharmD, BCPS  Clinical Pharmacist  Pager: [phone]  07/05/2015 11:01 AM

## 2015-07-05 NOTE — Care Management Important Message (Signed)
Important Message  Patient Details  Name: Rigoberto Noelancy [place] MRN: [id number] Date of Birth: 11/03/1929   Medicare Important Message Given:  Yes    Nechuma Boven P Emonni Depasquale 07/05/2015, 12:56 PM

## 2015-07-05 NOTE — Progress Notes (Signed)
Physical Therapy Treatment Patient Details Name: Redith Drach MRN: [id number] DOB: Oct 27, 1929 Today's Date: 07/05/2015    History of Present Illness 82 yo female admitted with fever, persistent LE redness and drainage. Sepsis related to LE cellulitis infection concerns for osteomyselitis. PMH: Atrial Fibrillation, PVD, HF LLE DVT, chronic L LE wound. 05/09/15 fall with hematoma at L SFA access    PT Comments    Patient reports sitting up in recliner x 6 hours today, walking to [facility] and to bed, and performing arm exercises. She deferred mobility, however agreeable to learning leg exercises to complete each day. She fatigued quickly with only 3 repetitions of some exercises. Agrees she will try to do them at least once each day (handout provided).   Follow Up Recommendations  SNF;Supervision/Assistance - 24 hour     Equipment Recommendations  None recommended by PT    Recommendations for Other Services       Precautions / Restrictions Precautions Precautions: Fall Precaution Comments: bilat LE wounds with drainage Restrictions Weight Bearing Restrictions: No    Mobility  Bed Mobility               General bed mobility comments: refused, agreed to exercises  Transfers                 General transfer comment: refused, agreed to exercises  Ambulation/Gait             General Gait Details: refused, agreed to exercises   Stairs            Wheelchair Mobility    Modified Rankin (Stroke Patients Only)       Balance                                    Cognition Arousal/Alertness: Awake/alert Behavior During Therapy: WFL for tasks assessed/performed Overall Cognitive Status: Within Functional Limits for tasks assessed                      Exercises General Exercises - Lower Extremity Ankle Circles/Pumps: Both;AROM;PROM;10 reps;Supine (stretching Lt heel cord) Heel Slides: Both;AROM (3 reps) Straight Leg Raises: Left;AROM  (3 reps) Hip Flexion/Marching: Both;AROM (2 reps) Other Exercises Other Exercises: attempted bridging with pt unable to bend knees enough (or lower HOB enough) to attempt Other Exercises: scapular adduction x 3 reps Other Exercises: discussed putting feet on floor when eating for knee flexion and ankle DF; can also do LAQ while this way    General Comments        Pertinent Vitals/Pain Pain Assessment: Faces Faces Pain Scale: Hurts little more Pain Location: LLE, heel Pain Intervention(s): Limited activity within patient's tolerance;Repositioned;Monitored during session    Home Living                      Prior Function            PT Goals (current goals can now be found in the care plan section) Acute Rehab PT Goals Patient Stated Goal: get her legs better Time For Goal Achievement: 07/12/15 Progress towards PT goals: Progressing toward goals (provided exercises to perform between PT sessions)    Frequency  Min 2X/week    PT Plan Current plan remains appropriate    Co-evaluation             End of Session   Activity Tolerance: Patient limited by fatigue Patient  left: in bed;with call bell/phone within reach;with nursing/sitter in room     Time: 1550-1605 PT Time Calculation (min) (ACUTE ONLY): 15 min  Charges:  $Therapeutic Exercise: 8-22 mins                    G Codes:      Avryl Roehm 07/05/2015, 4:26 PM Pager [phone]

## 2015-07-05 NOTE — Progress Notes (Signed)
[place] TEAM 1 - Stepdown/ICU TEAM PROGRESS NOTE  Kristina Diaz ZOX:[id number] DOB: Nov 09, 1929 DOA: 06/26/2015 PCP: [name], MD  Admit HPI / Brief Narrative: 82 y.o. F Hxatrial fibrillation, PVD, systolic heart failure (EF 45 to 50%), LLE DVT, and chronic left leg wound who presented with fever.  The patient has a history of nonhealing ulcers of the left lower extremity and underwent left popliteal and tibial revascularization with PTA of the popliteal, anterior tibial, and peroneal arteries by Dr. Loreta Ave w/ IR on 05/09/2015. Postprocedure, she had a fall and was found to have a hematoma at the left SFA access which resulted in an drop in her hemoglobin. She was admitted at that time and received blood transfusions. She was also admitted 2/13 for fever and infection LE, worsening drainage from wound. She was treated with IV antibiotics and underwent an unremarkable venogram, with ultimate discharge to a skilled nursing facility. She returned to the hospital on 06/26/15 with fevers, persistent LE redness, and drainage. Evaluation revealed SBP in the 80s, and lactic acid 2.6. Xray LE noted new and increased periosteal reaction involving distal left Femur. WBC 19. Cr 2.5  HPI/Subjective: Overall feeling better  Assessment/Plan:  Severe Sepsis w/ hypothermia and hypotension on admission due to LE cellulitis infection, MRI on 3/7 no osteomyelitis . sepsis has improved - she received broad empiric abx coverage with vanc and zosyn x8 days since admission, culture unrevealing, case discussed with ID on 3/8, recommended doxycycline x weeks.   LE cellulitis chronic wound infection - PAD -left popliteal and tibial revascularization with PTA of the popliteal, anterior tibial, and peroneal arteries by IR 05/09/2015 -Venogram 06/09/15 with patent vessels / no stricture  -Left tib-fib CT on 3/1 negative for abscess -L leg plain film concerning for distal L femur osteo v/s findings related to  PVD. MRI on 3/8 no osteo, but ? myositis - the only apparent tx option beyond conservative abx tx at this time appears to be amputation - discussed this w/ the pt, who would like to give abx a try at cure or at least suppression prior to considering evaluation for an amputation  -poor nutrition also contribute to unhealing wounds, will try albumin/lasix to reduce edema. Case discussed  with infectious disease Dr Ninetta Lights over the phone, recommended change to oral doxy x3weeks, continue follow up at wound care center, una boots/hyperbaric chamber if indicated,  patient reports she does not want to have amputation, but she is tired of dealing with the chronic leg wound, tired of being hospitalized frequently, not able to spent time with her husband who is in the same facility with her, she agreed with talking to palliative care for goal of care.   Malnutrition/anasarca: Severe malnutrition in context of chronic illness, on nutrition supplement,  Albumin and lasix on 3/8 and 3/9,  , +9liters since admission, may benefit from a few more days of albumin/lasix. Will monitor bp and cr level  Acute Renal failure on CKD III,  due to sepsis / hypotension - crt improving steadily  Cr around 1.1 for 4days, likely close to baseline  A fib, chronic continue coumadin pharmacy to dose- rate control improved - tx options limited by chronic hypotension - follow on tele - INR drifting back down toward therapeutic range slowly    Chronic Systolic CHF / Dilated Cardiomyopathy, last EF 35% in 05/2015 Continue to have bilateral lower extremity edema, worse on the left, started albumin and lasix. With good diuresis.    Filed Weights  07/03/15 0608 07/04/15 0542 07/05/15 0438  Weight: 60.782 kg (134 lb) 58.06 kg (128 lb) 54.885 kg (121 lb)   Hypothyroidism Cont synthroid   Supratherapeutic INR, INR 5.8 on admission Pharmacy to dose coumadin, close monitor INR.  Hypokalemia replaced   Hypomagnesemia replaced    Code Status: DNR Family Communication: no family present at time of exam Disposition Plan:  back to SNF after a few days of diuresis  Consultants: Infectious Dr hatcher over the phone on 3/8 Palliative care consult  Procedures: none  Antibiotics: Zosyn 3/1 > 3/8 Vancomycin 3/1 > 3/3 Doxycycline from 3/8  DVT prophylaxis: Warfarin   Objective: Blood pressure 111/52, pulse 75, temperature 98.3 F (36.8 C), temperature source Oral, resp. rate 18, height 5\' 4"  (1.626 m), weight 54.885 kg (121 lb), SpO2 94 %.  Intake/Output Summary (Last 24 hours) at 07/05/15 1810 Last data filed at 07/05/15 1356  Gross per 24 hour  Intake    700 ml  Output   1801 ml  Net  -1101 ml   Exam: General: No acute respiratory distress - alert and pleasant  Lungs: Clear to auscultation bilaterally - no wheezes or crackles  Cardiovascular: IRRR   Abdomen: Nontender, nondistended, soft, bowel sounds positive, no rebound Extremities: L LE dressed - continues to weep serous type fluid - no cyanosis of feet B Skin: scattered superficial wound with duoderm dressing on  Data Reviewed:  Basic Metabolic Panel:  Recent Labs Lab 06/30/15 0250 07/01/15 0429 07/03/15 0440 07/04/15 0516 07/05/15 0420  NA 141 143 141 140 139  K 4.5 4.8 4.5 3.6 3.9  CL 108 109 108 106 104  CO2 25 26 25 26 26   GLUCOSE 80 96 95 81 85  BUN 20 16 12 12 11   CREATININE 1.01* 1.08* 1.09* 1.11* 1.00  CALCIUM 8.6* 8.6* 8.4* 8.0* 8.2*  MG  --   --   --   --  1.6*    CBC:  Recent Labs Lab 06/30/15 0250 07/01/15 0429 07/02/15 0456 07/03/15 0440 07/05/15 0420  WBC 6.5 6.2 4.7 5.4 4.7  HGB 9.4* 9.7* 8.2* 8.6* 8.1*  HCT 30.6* 31.6* 26.5* 27.6* 26.9*  MCV 93.3 94.0 93.6 94.2 94.7  PLT 133* 152 125* 134* 128*    Liver Function Tests:  Recent Labs Lab 06/29/15 0230 07/01/15 0429  AST 30 32  ALT 28 24  ALKPHOS 285* 259*  BILITOT 0.6 0.7  PROT 5.6* 6.5  ALBUMIN 1.8* 2.0*   Coags:  Recent Labs Lab  07/01/15 0429 07/02/15 0456 07/03/15 0440 07/04/15 0516 07/05/15 0420  INR 2.58* 2.55* 2.67* 2.85* 2.55*   No results for input(s): APTT in the last 168 hours.  Recent Results (from the past 240 hour(s))  Blood Culture (routine x 2)     Status: None   Collection Time: 06/26/15 11:50 AM  Result Value Ref Range Status   Specimen Description BLOOD RIGHT FOREARM  Final   Special Requests BOTTLES DRAWN AEROBIC AND ANAEROBIC 3MLS  Final   Culture NO GROWTH 5 DAYS  Final   Report Status 07/01/2015 FINAL  Final  Blood Culture (routine x 2)     Status: None   Collection Time: 06/26/15 12:10 PM  Result Value Ref Range Status   Specimen Description BLOOD LEFT HAND  Final   Special Requests BOTTLES DRAWN AEROBIC ONLY 5CCS  Final   Culture NO GROWTH 5 DAYS  Final   Report Status 07/01/2015 FINAL  Final  Urine culture     Status:  None   Collection Time: 06/26/15  7:56 PM  Result Value Ref Range Status   Specimen Description URINE, RANDOM  Final   Special Requests NONE  Final   Culture NO GROWTH 2 DAYS  Final   Report Status 06/28/2015 FINAL  Final  MRSA PCR Screening     Status: None   Collection Time: 06/26/15  9:49 PM  Result Value Ref Range Status   MRSA by PCR NEGATIVE NEGATIVE Final    Comment:        The GeneXpert MRSA Assay (FDA approved for NASAL specimens only), is one component of a comprehensive MRSA colonization surveillance program. It is not intended to diagnose MRSA infection nor to guide or monitor treatment for MRSA infections.      Studies:   Recent x-ray studies have been reviewed in detail by the Attending Physician  Scheduled Meds:  Scheduled Meds: . albumin human  12.5 g Intravenous Once  . aspirin EC  81 mg Oral Daily  . collagenase   Topical Daily  . doxycycline  100 mg Oral Q12H  . feeding supplement (ENSURE ENLIVE)  237 mL Oral BID BM  . feeding supplement (PRO-STAT SUGAR FREE 64)  30 mL Oral BID  . furosemide  40 mg Intravenous Once  .  levothyroxine  37.5 mcg Oral QAC breakfast  . metoprolol tartrate  12.5 mg Oral BID  . multivitamin with minerals  1 tablet Oral q morning - 10a  . polyethylene glycol  17 g Oral Daily  . warfarin  2 mg Oral q1800  . Warfarin - Pharmacist Dosing Inpatient   Does not apply q1800    Time spent on care of this patient: 25 mins   Maile Linford , MD PhD [phone] [facility] Office  [phone] Pager - Text Page per Amion as per below:  On-Call/Text Page:      [email or web address]      password [email or web address]  If 7PM-7AM, please contact night-coverage [email or web address] Password [email or web address] 07/05/2015, 6:10 PM   LOS: 9 days

## 2015-07-06 DIAGNOSIS — Z66 Do not resuscitate: Secondary | ICD-10-CM

## 2015-07-06 LAB — PROTIME-INR
INR: 2.35 — AB (ref 0.00–1.49)
PROTHROMBIN TIME: 25.5 s — AB (ref 11.6–15.2)

## 2015-07-06 LAB — BASIC METABOLIC PANEL
Anion gap: 8 (ref 5–15)
BUN: 13 mg/dL (ref 6–20)
CALCIUM: 8.3 mg/dL — AB (ref 8.9–10.3)
CO2: 28 mmol/L (ref 22–32)
CREATININE: 0.91 mg/dL (ref 0.44–1.00)
Chloride: 104 mmol/L (ref 101–111)
GFR calc Af Amer: >60 mL/min (ref >60)
GFR, EST NON AFRICAN AMERICAN: 57 mL/min — AB (ref >60)
GLUCOSE: 81 mg/dL (ref 65–99)
POTASSIUM: 3.5 mmol/L (ref 3.5–5.1)
SODIUM: 140 mmol/L (ref 135–145)

## 2015-07-06 LAB — MAGNESIUM: MAGNESIUM: 2 mg/dL (ref 1.7–2.4)

## 2015-07-06 MED ORDER — POTASSIUM CHLORIDE CRYS ER 20 MEQ PO TBCR
40.0000 meq | EXTENDED_RELEASE_TABLET | Freq: Once | ORAL | Status: AC
  Administered 2015-07-06: 40 meq via ORAL
  Filled 2015-07-06: qty 2

## 2015-07-06 MED ORDER — ALBUMIN HUMAN 25 % IV SOLN
12.5000 g | Freq: Once | INTRAVENOUS | Status: AC
  Administered 2015-07-06: 12.5 g via INTRAVENOUS
  Filled 2015-07-06: qty 50

## 2015-07-06 MED ORDER — FUROSEMIDE 10 MG/ML IJ SOLN
40.0000 mg | Freq: Once | INTRAMUSCULAR | Status: AC
  Administered 2015-07-06: 40 mg via INTRAVENOUS

## 2015-07-06 NOTE — Progress Notes (Signed)
ANTICOAGULATION CONSULT NOTE - Follow Up Consult  Pharmacy Consult for warfarin Indication: atrial fibrillation and hx DVT  Allergies  Allergen Reactions  . Bactrim Ds [Sulfamethoxazole-Trimethoprim] Rash  . Contrast Media [Iodinated Diagnostic Agents] Hives    BLE erythema   . Ciprofloxacin Rash  . Tape Rash    Patient Measurements: Height: 5\' 4"  (162.6 cm) Weight: 119 lb (53.978 kg) IBW/kg (Calculated) : 54.7  Vital Signs: Temp: 98.5 F (36.9 C) (03/11 0636) Temp Source: Oral (03/11 0636) BP: 106/59 mmHg (03/11 0636) Pulse Rate: 60 (03/11 0636)  Labs:  Recent Labs  07/04/15 0516 07/05/15 0420 07/06/15 0440  HGB  --  8.1*  --   HCT  --  26.9*  --   PLT  --  128*  --   LABPROT 29.4* 27.1* 25.5*  INR 2.85* 2.55* 2.35*  CREATININE 1.11* 1.00 0.91  CKTOTAL 44  --   --     Estimated Creatinine Clearance: 39.9 mL/min (by C-G formula based on Cr of 0.91).   Assessment: 82 y/o female on chronic warfarin for Afib/hx DVT, admitted with sepsis from LLE cellulitis. Pharmacy consulted to manage warfarin. Abx changed to doxy. Plan for 3 months of therapy. Doxycycline might decrease coumadin metabolism, therefore might require reduce coumadin dose during the course of therapy.  INR 2.55>2.35. hgb 8.1, pltc 128K, low stable  PTA dose of 4.5 mg daily EXCEPT for 3 mg on Tues/Fri per clinic note. Admit INR was 5.31.   Goal of Therapy:  INR 2-3 Monitor platelets by anticoagulation protocol: Yes   Plan:  Coumadin 2 mg daily Monitor daily INR, CBC, s/s of bleed If discharge, recommend changing dose to 2 mg daily with outpatient INR check in 2-3 days  Sandi CarneNick Khalon Cansler, PharmD Pharmacy Resident Pager: [phone] 07/06/2015 7:36 AM

## 2015-07-06 NOTE — Progress Notes (Addendum)
[place] TEAM 1 - Stepdown/ICU TEAM PROGRESS NOTE  Kristina Diaz ZOX:[id number] DOB: Mar 04, 1930 DOA: 06/26/2015 PCP: [name], MD  Admit HPI / Brief Narrative: 82 y.o. F Hxatrial fibrillation, PVD, systolic heart failure (EF 45 to 50%), LLE DVT, and chronic left leg wound who presented with fever.  The patient has a history of nonhealing ulcers of the left lower extremity and underwent left popliteal and tibial revascularization with PTA of the popliteal, anterior tibial, and peroneal arteries by Dr. Loreta Ave w/ IR on 05/09/2015. Postprocedure, she had a fall and was found to have a hematoma at the left SFA access which resulted in an drop in her hemoglobin. She was admitted at that time and received blood transfusions. She was also admitted 2/13 for fever and infection LE, worsening drainage from wound. She was treated with IV antibiotics and underwent an unremarkable venogram, with ultimate discharge to a skilled nursing facility. She returned to the hospital on 06/26/15 with fevers, persistent LE redness, and drainage. Evaluation revealed SBP in the 80s, and lactic acid 2.6. Xray LE noted new and increased periosteal reaction involving distal left Femur. WBC 19. Cr 2.5  HPI/Subjective: Overall feeling better, legs are less swallow, left leg keep weeping fluids but less than before  Assessment/Plan:  Severe Sepsis w/ hypothermia and hypotension on admission due to LE cellulitis infection, MRI on 3/7 no osteomyelitis , but + myositis. sepsis has improved - she received broad empiric abx coverage with vanc and zosyn x8 days since admission, culture unrevealing, case discussed with ID on 3/8, recommended doxycycline x weeks.   LE cellulitis chronic wound infection - PAD -left popliteal and tibial revascularization with PTA of the popliteal, anterior tibial, and peroneal arteries by IR 05/09/2015 -Venogram 06/09/15 with patent vessels / no stricture  -Left tib-fib CT on 3/1 negative  for abscess -L leg plain film concerning for distal L femur osteo v/s findings related to PVD. MRI on 3/8 no osteo, but ? myositis - the only apparent tx option beyond conservative abx tx at this time appears to be amputation - discussed this w/ the pt, who would like to give abx a try at cure or at least suppression prior to considering evaluation for an amputation  -poor nutrition also contribute to unhealing wounds, will try albumin/lasix to reduce edema. Case discussed  with infectious disease Dr Ninetta Lights over the phone, recommended change to oral doxy x3weeks, continue follow up at wound care center, una boots/hyperbaric chamber if indicated,  patient reports she does not want to have amputation, but she is tired of dealing with the chronic leg wound, tired of being hospitalized frequently, not able to spent time with her husband who is in the same facility with her, she agreed with talking to palliative care for goal of care.   Severe Malnutrition/anasarca: Severe malnutrition in context of chronic illness, on nutrition supplement,  Albumin and lasix on 3/8 and 3/9,  , +9liters since admission, may benefit from a few more days of albumin/lasix. bp and cr level stable so far, additional lasix/albumin on 3/11.  Acute Renal failure on CKD III,  due to sepsis / hypotension - crt improving steadily  Cr slowing improving, 0.9 on 3/11.  A fib, chronic continue coumadin pharmacy to dose- rate control improved - tx options limited by chronic hypotension - follow on tele - INR drifting back down toward therapeutic range slowly    Chronic Systolic CHF / Dilated Cardiomyopathy, last EF 35% in 05/2015 Continue to have bilateral  lower extremity edema, worse on the left, started albumin and lasix. With good diuresis.    Filed Weights   07/04/15 0542 07/05/15 0438 07/06/15 0636  Weight: 58.06 kg (128 lb) 54.885 kg (121 lb) 53.978 kg (119 lb)   Hypothyroidism Cont synthroid   Supratherapeutic INR, INR  5.8 on admission Pharmacy to dose coumadin, close monitor INR.  Hypokalemia Continue to replace  Hypomagnesemia Replace prn to keep mag>2,  Code Status: DNR Family Communication: no family present at time of exam Disposition Plan:  back to SNF after a few days of diuresis  Consultants: Infectious Dr hatcher over the phone on 3/8 Palliative care consult  Procedures: none  Antibiotics: Zosyn 3/1 > 3/8 Vancomycin 3/1 > 3/3 Doxycycline from 3/8  DVT prophylaxis: Warfarin   Objective: Blood pressure 107/55, pulse 94, temperature 97.5 F (36.4 C), temperature source Oral, resp. rate 18, height  (1.626 m), weight 53.978 kg (119 lb), SpO2 92 %.  Intake/Output Summary (Last 24 hours) at 07/06/15 1529 Last data filed at 07/06/15 0600  Gross per 24 hour  Intake    110 ml  Output   1200 ml  Net  -1090 ml   Exam: General: No acute respiratory distress - alert and pleasant  Lungs: Clear to auscultation bilaterally - no wheezes or crackles  Cardiovascular: IRRR   Abdomen: Nontender, nondistended, soft, bowel sounds positive, no rebound Extremities: L LE dressed - continues to weep serous type fluid but less swollen- no cyanosis of feet B, right lower extremity pitting edema is also improving Skin: scattered superficial wound with duoderm dressing on  Data Reviewed:  Basic Metabolic Panel:  Recent Labs Lab 07/01/15 0429 07/03/15 0440 07/04/15 0516 07/05/15 0420 07/06/15 0440  NA 143 141 140 139 140  K 4.8 4.5 3.6 3.9 3.5  CL 109 108 106 104 104  CO2 GLUCOSE 96 95 81 85 81  BUN CREATININE 1.08* 1.09* 1.11* 1.00 0.91  CALCIUM 8.6* 8.4* 8.0* 8.2* 8.3*  MG  --   --   --  1.6* 2.0    CBC:  Recent Labs Lab 06/30/15 0250 07/01/15 0429 07/02/15 0456 07/03/15 0440 07/05/15 0420  WBC 6.5 6.2 4.7 5.4 4.7  HGB 9.4* 9.7* 8.2* 8.6* 8.1*  HCT 30.6* 31.6* 26.5* 27.6* 26.9*  MCV 93.3 94.0 93.6 94.2 94.7  PLT 133* 152 125* 134* 128*     Liver Function Tests:  Recent Labs Lab 07/01/15 0429  AST 32  ALT 24  ALKPHOS 259*  BILITOT 0.7  PROT 6.5  ALBUMIN 2.0*   Coags:  Recent Labs Lab 07/02/15 0456 07/03/15 0440 07/04/15 0516 07/05/15 0420 07/06/15 0440  INR 2.55* 2.67* 2.85* 2.55* 2.35*   No results for input(s): APTT in the last 168 hours.  Recent Results (from the past 240 hour(s))  Urine culture     Status: None   Collection Time: 06/26/15  7:56 PM  Result Value Ref Range Status   Specimen Description URINE, RANDOM  Final   Special Requests NONE  Final   Culture NO GROWTH 2 DAYS  Final   Report Status 06/28/2015 FINAL  Final  MRSA PCR Screening     Status: None   Collection Time: 06/26/15  9:49 PM  Result Value Ref Range Status   MRSA by PCR NEGATIVE NEGATIVE Final    Comment:        The GeneXpert MRSA Assay (FDA approved for NASAL specimens only),  is one component of a comprehensive MRSA colonization surveillance program. It is not intended to diagnose MRSA infection nor to guide or monitor treatment for MRSA infections.      Studies:   Recent x-ray studies have been reviewed in detail by the Attending Physician  Scheduled Meds:  Scheduled Meds: . albumin human  12.5 g Intravenous Once  . aspirin EC  81 mg Oral Daily  . collagenase   Topical Daily  . doxycycline  100 mg Oral Q12H  . feeding supplement (ENSURE ENLIVE)  237 mL Oral BID BM  . feeding supplement (PRO-STAT SUGAR FREE 64)  30 mL Oral BID  . furosemide  40 mg Intravenous Once  . levothyroxine  37.5 mcg Oral QAC breakfast  . metoprolol tartrate  12.5 mg Oral BID  . multivitamin with minerals  1 tablet Oral q morning - 10a  . polyethylene glycol  17 g Oral Daily  . warfarin  2 mg Oral q1800  . Warfarin - Pharmacist Dosing Inpatient   Does not apply q1800    Time spent on care of this patient: 25 mins   Aishah Teffeteller , MD PhD [phone] [facility] Office  [phone] Pager - Text Page per Amion as per  below:  On-Call/Text Page:      [email or web address]      password [email or web address]  If 7PM-7AM, please contact night-coverage [email or web address] Password [email or web address] 07/06/2015, 3:29 PM   LOS: 10 days

## 2015-07-07 DIAGNOSIS — R791 Abnormal coagulation profile: Secondary | ICD-10-CM

## 2015-07-07 DIAGNOSIS — E038 Other specified hypothyroidism: Secondary | ICD-10-CM

## 2015-07-07 LAB — PROTIME-INR
INR: 2.02 — AB (ref 0.00–1.49)
PROTHROMBIN TIME: 22.7 s — AB (ref 11.6–15.2)

## 2015-07-07 LAB — BASIC METABOLIC PANEL
ANION GAP: 9 (ref 5–15)
BUN: 24 mg/dL — ABNORMAL HIGH (ref 6–20)
CALCIUM: 8.3 mg/dL — AB (ref 8.9–10.3)
CO2: 23 mmol/L (ref 22–32)
CREATININE: 1.34 mg/dL — AB (ref 0.44–1.00)
Chloride: 108 mmol/L (ref 101–111)
GFR, EST AFRICAN AMERICAN: 41 mL/min — AB (ref >60)
GFR, EST NON AFRICAN AMERICAN: 36 mL/min — AB (ref >60)
Glucose, Bld: 84 mg/dL (ref 65–99)
Potassium: 4.9 mmol/L (ref 3.5–5.1)
SODIUM: 140 mmol/L (ref 135–145)

## 2015-07-07 LAB — MAGNESIUM: MAGNESIUM: 2 mg/dL (ref 1.7–2.4)

## 2015-07-07 MED ORDER — WARFARIN SODIUM 2 MG PO TABS: 2.0000 mg | ORAL_TABLET | Freq: Every day | ORAL | Status: DC

## 2015-07-07 MED ORDER — WARFARIN SODIUM 3 MG PO TABS: 3.0000 mg | ORAL_TABLET | Freq: Once | ORAL | Status: DC

## 2015-07-07 MED ORDER — TORSEMIDE 20 MG PO TABS: 20.0000 mg | ORAL_TABLET | Freq: Every day | ORAL | Status: DC

## 2015-07-07 MED ORDER — OXYCODONE HCL 5 MG PO TABS: 5.0000 mg | ORAL_TABLET | Freq: Four times a day (QID) | ORAL | Status: DC | PRN

## 2015-07-07 MED ORDER — DOXYCYCLINE HYCLATE 100 MG PO TABS
100.0000 mg | ORAL_TABLET | Freq: Two times a day (BID) | ORAL | Status: DC
Start: 2015-07-07 — End: 2015-07-26

## 2015-07-07 MED ORDER — COLLAGENASE 250 UNIT/GM EX OINT: TOPICAL_OINTMENT | Freq: Every day | CUTANEOUS | Status: DC

## 2015-07-07 NOTE — Progress Notes (Signed)
ANTICOAGULATION CONSULT NOTE - Follow Up Consult  Pharmacy Consult for warfarin Indication: atrial fibrillation and hx DVT  Allergies  Allergen Reactions  . Bactrim Ds [Sulfamethoxazole-Trimethoprim] Rash  . Contrast Media [Iodinated Diagnostic Agents] Hives    BLE erythema   . Ciprofloxacin Rash  . Tape Rash    Patient Measurements: Height: 5\' 4"  (162.6 cm) Weight: 116 lb (52.617 kg) IBW/kg (Calculated) : 54.7  Vital Signs: Temp: 98.6 F (37 C) (03/12 0500) Temp Source: Oral (03/12 0500) BP: 100/56 mmHg (03/12 0500) Pulse Rate: 52 (03/12 0500)  Labs:  Recent Labs  07/05/15 0420 07/06/15 0440 07/07/15 0414  HGB 8.1*  --   --   HCT 26.9*  --   --   PLT 128*  --   --   LABPROT 27.1* 25.5* 22.7*  INR 2.55* 2.35* 2.02*  CREATININE 1.00 0.91 1.34*    Estimated Creatinine Clearance: 26.4 mL/min (by C-G formula based on Cr of 1.34).   Assessment: 82 y/o female on chronic warfarin for Afib/hx DVT, admitted with sepsis from LLE cellulitis. Pharmacy consulted to manage warfarin. Abx changed to doxy and plan for 3 weeks of therapy per MD note. Doxycycline might decrease coumadin metabolism, therefore might require reduced coumadin dose during the course of therapy.  Pt has been on warfarin 2 mg daily, significantly reduced from home dose, however, INR continues to drift down.   INR 2.55>2.35>2.02. hgb 8.1, pltc 128K, low stable  PTA dose of 4.5 mg daily EXCEPT for 3 mg on Tues/Fri per clinic note. Admit INR was 5.31.   Goal of Therapy:  INR 2-3 Monitor platelets by anticoagulation protocol: Yes   Plan:  Coumadin 3 mg po x 1 tonight Monitor daily INR, CBC, s/s of bleed Will likely need to restart lower home dose of warfarin upon discharge.  Sandi CarneNick Cody Albus, PharmD Pharmacy Resident Pager: [phone] 07/07/2015 8:10 AM

## 2015-07-07 NOTE — Progress Notes (Signed)
Per RN, Pt ready for d/c.  Son to arrive shortly to transport.  [facility]  He transferred SW to another office and SW left a message.  [facility]  Called Jillyn HiddenGary back.  Jillyn HiddenGary instructed SW to go ahead and send the Pt.    Provided facility with necessary paperwork.  RN now asking for ambulance transport.  Arranged for ambulance transport.  Pt to be d/c'd.  Providence CrosbyAmanda Marina Boerner, LCSW Clinical Social Work [phone]

## 2015-07-07 NOTE — Discharge Summary (Signed)
Discharge Summary  Kristina Diaz ZOX:[id number] DOB: 03/16/1930  PCP: [name], MD  Admit date: 06/26/2015 Discharge date: 07/07/2015  Time spent: >85mins  Recommendations for Outpatient Follow-up:  1. ALF to check INR every three days x3 times, report result to pmd, warfarin dose likely will need to be increased once finish doxycycline treatment. 2. F/u with PMD within a week  for hospital discharge follow up, repeat cbc/bmp at follow up 3. wound care center follow up in one week. 4. Palliative care to continue follow patient at ALF.  Discharge Diagnoses:  Active Hospital Problems   Diagnosis Date Noted  . DNR (do not resuscitate)   . Palliative care encounter   . Weakness generalized   . Cellulitis of left lower leg   . Fever   . Wound infection (HCC)   . Chronic systolic heart failure (HCC)   . Dilated cardiomyopathy (HCC)   . Supratherapeutic INR   . Acute renal failure (ARF) (HCC) 06/26/2015  . Cellulitis of left lower extremity   . Sepsis (HCC) 01/23/2015  . Wound of lower extremity 08/08/2012  . Hypothyroidism 08/07/2012  . PAD (peripheral artery disease) (HCC)   . Atrial fibrillation [facility])     Resolved Hospital Problems   Diagnosis Date Noted Date Resolved  No resolved problems to display.    Discharge Condition: stable  Diet recommendation: heart healthy  Filed Weights   07/05/15 0438 07/06/15 0636 07/07/15 0500  Weight: 54.885 kg (121 lb) 53.978 kg (119 lb) 52.617 kg (116 lb)    History of present illness:  Kristina Diaz is a 82 y.o. female 82 y.o. year-old female with history of atrial fibrillation, PVD, heart failure Ef 45 to 50 % LLE DVT, chronic left leg wound who presents with fever.  The patient has a history of nonhealing ulcers of the left lower extremity and underwent left popliteal and tibial revascularization with PTA of the popliteal, anterior tibial, and peroneal arteries by Dr. Loreta Ave on 05/09/2015. Postprocedure, she had a fall and  was found to have a hematoma at the left SFA access which resulted in an drop in her hemoglobin. She was admitted at that time and received blood transfusion. She was recently admitted 2-13 for fever and infection LE, worsening drainage from wound. She was treated with IV antibiotics and discharge to facility. She had Venogram done 2-12 with patent vessels.  She presents today 3-01 with fevers, persistent LE redness, and drainage. Evaluation in the ED; SBP in the 80, initial lactic acid initially at 2.6 , decrease to 1.5. X ray LE; new and increased periosteal reaction involving distal left Femur.  WBC at 19. Chest x ray bronchitic changes. Cr 2.5.   Hospital Course:  Active Problems:   PAD (peripheral artery disease) (HCC)   Atrial fibrillation (HCC)   Hypothyroidism   Wound of lower extremity   Sepsis (HCC)   Cellulitis of left lower extremity   Acute renal failure (ARF) (HCC)   Cellulitis of left lower leg   Fever   Wound infection (HCC)   Chronic systolic heart failure (HCC)   Dilated cardiomyopathy (HCC)   Supratherapeutic INR   DNR (do not resuscitate)   Palliative care encounter   Weakness generalized  Severe Sepsis w/ hypothermia and hypotension on admission due to LE cellulitis infection, MRI on 3/7 no osteomyelitis , but + myositis. sepsis has improved - she received broad empiric abx coverage with vanc and zosyn x8 days since admission, culture unrevealing, case discussed with ID on 3/8,  recommended doxycycline x weeks.   LE cellulitis chronic wound infection - PAD -left popliteal and tibial revascularization with PTA of the popliteal, anterior tibial, and peroneal arteries by IR 05/09/2015 -Venogram 06/09/15 with patent vessels / no stricture  -Left tib-fib CT on 3/1 negative for abscess -L leg plain film concerning for distal L femur osteo v/s findings related to PVD. MRI on 3/8 no osteo, but ? myositis - the only apparent tx option beyond conservative abx tx at this  time appears to be amputation - discussed this w/ the pt, who would like to give abx a try at cure or at least suppression prior to considering evaluation for an amputation  -poor nutrition also contribute to unhealing wounds, patient recieved albumin/lasix x three doses with good result.  Case discussed with infectious disease Dr Ninetta Lights over the phone, recommended change to oral doxy x3weeks, continue follow up at wound care center, una boots/hyperbaric chamber if indicated, - patient reports she does not want to have amputation, but she is tired of dealing with the chronic leg wound, tired of being hospitalized frequently, not able to spent time with her husband who is in the same facility with her, she agreed with talking to palliative care for goal of care. Patient is considering hospice at some time point, patient is to continue follow with palliative care at ALF.  Wound care instruction: Dressing procedure/placement/frequency: Foam dressings to promote healing to right leg wounds, float heels to reduce pressure.  Santyl for chemical debridement and nonadherent dressings to decrease pain with dressing changes. Pt states the wound care center is no longer using an ace wrap or coban layer for compression. She can resume follow-up at the outpatient wound care center after discharge.   Severe Malnutrition/anasarca: Severe malnutrition in context of chronic illness, on nutrition supplement, Albumin and lasix on 3/8 and 3/9, , +9liters since admission, good response from three days of albumin/lasix. bp and cr level stable so far. Resume demadex on 3/14. Dose titrate per outpatietn MD, repeat bmp in one week to monitor cr level.   Acute Renal failure on CKD III,  due to sepsis / hypotension - crt improving steadily  Cr stable at discharge, repeat bmp in one week.  A fib, chronic on coumadin Supratherapeutic INR, INR 5.8 on admission  coumadin held briefly then restarted, INR has been stable  between 2-3. Coumadin dose adjusted due to on doxycycline. Need to check INR qthree days for three times after discharge, coumadin dose need to be adjusted once finish doxycycline.   rate controlled on lopressor   Chronic Systolic CHF / Dilated Cardiomyopathy, last EF 35% in 05/2015 Continue to have bilateral lower extremity edema, worse on the left, started albumin and lasix. With good diuresis.  Discharge on home dose demadex to be stared on 3/14.  Filed Weights   07/04/15 0542 07/05/15 0438 07/06/15 0636  Weight: 58.06 kg (128 lb) 54.885 kg (121 lb) 53.978 kg (119 lb)   Hypothyroidism Cont synthroid    Hypokalemia Continue to replace  Hypomagnesemia Replace prn to keep mag>2,  Code Status: DNR Family Communication: no family present at time of exam Disposition Plan: back to ALF with home health  Consultants: Infectious Dr hatcher over the phone on 3/8 Palliative care consult  Procedures: none  Antibiotics: Zosyn 3/1 > 3/8 Vancomycin 3/1 > 3/3 Doxycycline from 3/8  DVT prophylaxis: Warfarin       Discharge Exam: BP 100/56 mmHg  Pulse 52  Temp(Src) 98.6 F (37  C) (Oral)  Resp 18  Ht  (1.626 m)  Wt 52.617 kg (116 lb)  BMI 19.90 kg/m2  SpO2 98%  General: No acute respiratory distress - alert and pleasant  Lungs: Clear to auscultation bilaterally - no wheezes or crackles  Cardiovascular: IRRR  Abdomen: Nontender, nondistended, soft, bowel sounds positive, no rebound Extremities: L LE dressed - continues to weep serous type fluid but less swollen- no cyanosis of feet B, right lower extremity pitting edema is also improving Skin: scattered superficial wound with duoderm dressing on   Discharge Instructions You were cared for by a hospitalist during your hospital stay. If you have any questions about your discharge medications or the care you received while you were in the hospital after you are discharged, you can call the unit and  asked to speak with the hospitalist on call if the hospitalist that took care of you is not available. Once you are discharged, your primary care physician will handle any further medical issues. Please note that NO REFILLS for any discharge medications will be authorized once you are discharged, as it is imperative that you return to your primary care physician (or establish a relationship with a primary care physician if you do not have one) for your aftercare needs so that they can reassess your need for medications and monitor your lab values.      Discharge Instructions    Diet - low sodium heart healthy    Complete by:  As directed      Face-to-face encounter (required for Medicare/Medicaid patients)    Complete by:  As directed   I Lucetta Baehr certify that this patient is under my care and that I, or a nurse practitioner or physician's assistant working with me, had a face-to-face encounter that meets the physician face-to-face encounter requirements with this patient on 07/07/2015. The encounter with the patient was in whole, or in part for the following medical condition(s) which is the primary reason for home health care (List medical condition): FTT  Dressing procedure/placement/frequency: Foam dressings to promote healing to right leg wounds, float heels to reduce pressure. Santyl for chemical debridement and nonadherent dressings to decrease pain with dressing changes.  Pt states the wound care center is no longer using an ace wrap or coban layer for compression. She can resume follow-up at the outpatient wound care center after discharge.  Please check INR every three days for three times, report INR result to PMD.  The encounter with the patient was in whole, or in part, for the following medical condition, which is the primary reason for home health care:  FTT  I certify that, based on my findings, the following services are medically necessary home health services:   Nursing Physical  therapy    Reason for Medically Necessary Home Health Services:  Skilled Nursing- Change/Decline in Patient Status  My clinical findings support the need for the above services:  Unable to leave home safely without assistance and/or assistive device  Further, I certify that my clinical findings support that this patient is homebound due to:  Open/draining pressure/stasis ulcer     Home Health    Complete by:  As directed   To provide the following care/treatments:   PT RN Home Health Aide       Increase activity slowly    Complete by:  As directed             Medication List    STOP taking these medications  enoxaparin 80 MG/0.8ML injection  Commonly known as:  LOVENOX      TAKE these medications        aspirin 81 MG EC tablet  Take 1 tablet (81 mg total) by mouth daily.     collagenase ointment  Commonly known as:  SANTYL  Apply topically daily.     docusate sodium 100 MG capsule  Commonly known as:  COLACE  Take 100 mg by mouth daily.     doxycycline 100 MG tablet  Commonly known as:  VIBRA-TABS  Take 1 tablet (100 mg total) by mouth every 12 (twelve) hours.     feeding supplement (ENSURE ENLIVE) Liqd  Take 237 mLs by mouth 2 (two) times daily between meals.     feeding supplement (PRO-STAT SUGAR FREE 64) Liqd  Take 30 mLs by mouth 2 (two) times daily.     levothyroxine 25 MCG tablet  Commonly known as:  SYNTHROID, LEVOTHROID  TAKE 1 AND 1/2 TABS (37.5MG ) BY MOUTH EVERY DAY*CHECK PULSE WEEKLY*     metoprolol succinate 25 MG 24 hr tablet  Commonly known as:  TOPROL-XL  Take 0.5 tablets (12.5 mg total) by mouth daily.     multivitamin with minerals Tabs tablet  Take 1 tablet by mouth every morning.     oxyCODONE 5 MG immediate release tablet  Commonly known as:  Oxy IR/ROXICODONE  Take 1 tablet (5 mg total) by mouth every 6 (six) hours as needed for moderate pain or severe pain.     torsemide 20 MG tablet  Commonly known as:  DEMADEX  Take 1  tablet (20 mg total) by mouth daily. Reported on 06/10/2015  Start taking on:  07/09/2015     warfarin 2 MG tablet  Commonly known as:  COUMADIN  Take 1 tablet (2 mg total) by mouth daily at 6 PM.       Allergies  Allergen Reactions  . Bactrim Ds [Sulfamethoxazole-Trimethoprim] Rash  . Contrast Media [Iodinated Diagnostic Agents] Hives    BLE erythema   . Ciprofloxacin Rash  . Tape Rash   Follow-up Information    Follow up with [facility].   Specialty:  Assisted Living Facility   Why:  check INR every three days for three time, coumadin likely need to be uptitrated once finish doxycycline treatment.    Contact information:   [address] [place] [address] [phone]       The results of significant diagnostics from this hospitalization (including imaging, microbiology, ancillary and laboratory) are listed below for reference.    Significant Diagnostic Studies: Dg Chest 2 View  06/10/2015  CLINICAL DATA:  Fever, atrial fibrillation EXAM: CHEST  2 VIEW COMPARISON:  05/10/2015 FINDINGS: Cardiomegaly again noted. No acute infiltrate or pleural effusion. No pulmonary edema. The left lung apex is obscured by overlying patient's chain. Osteopenia and degenerative changes thoracic spine. Again noted old fracture deformity of L2 vertebral body. IMPRESSION: No active disease. Osteopenia and degenerative changes thoracic spine. Stable chronic fracture deformity of L2 vertebral body. Electronically Signed   By: Natasha Mead M.D.   On: 06/10/2015 14:02   Dg Tibia/fibula Left  06/10/2015  CLINICAL DATA:  Left lower extremity nonhealing wound, peripheral vascular disease EXAM: LEFT TIBIA AND FIBULA - 2 VIEW COMPARISON:  05/09/2015 FINDINGS: Bones are osteopenic. Normal alignment. No acute osseous finding or fracture. Peripheral atherosclerosis evident. IMPRESSION: No acute osseous finding. Osteopenia Peripheral atherosclerosis Electronically Signed   By: Judie Petit.   Shick M.D.  On: 06/10/2015 15:40   Ct Lumbar Spine Wo Contrast  06/09/2015  CLINICAL DATA:  82 year old female with lower back pain evaluate for lumbar spine fractures. EXAM: CT LUMBAR SPINE WITHOUT CONTRAST TECHNIQUE: Multidetector CT imaging of the lumbar spine was performed without intravenous contrast administration. Multiplanar CT image reconstructions were also generated. COMPARISON:  CT and radiographs dated 05/10/2015 FINDINGS: There is compression fracture of the L2 vertebra with near complete loss of vertebral body height and anterior wedging similar prior study. There is approximately 4 mm retropulsion of the superior posterior cortex of the L2 with mild focal narrowing of the central canal similar to prior study. There is resulting kyphosis of the spine at L2. No new fracture identified. The bones are osteopenic. Multilevel disc desiccation with vacuum phenomenon noted. There is grade 1 L5-S1 anterolisthesis. Constipation. No definite evidence of bowel obstruction. A 1.5 cm ill-defined hypodensity in the region of the head of the pancreas. IMPRESSION: L2 compression fracture, similar prior study with near complete loss of vertebral body height and anterior wedging. No new fracture identified. Electronically Signed   By: Elgie Collard M.D.   On: 06/09/2015 00:43   Ir Veno/ext/uni Left  06/17/2015  Gilmer Mor, DO     06/17/2015  1:15 PM Interventional Radiology Procedure Note Procedure:  US guided access of left pedal vein, placement of IV for LLE venogram. US guided access of left femoral vein, venogram of iliac system. Complications: None Findings: Venogram LLE with and without tourniquet of the ankle demonstrates patency of the major tibial veins, and patency of the popliteal vein, patent femoral vein.  Venogram iliac system shows patent iliac system.  No occlusion or stenosis.  Recommendations: - Routine wound care left pedal vein IV site, now removed. - Routine wound care left femoral vein  puncture site. - Do not submerge for 7 days. - Continue current care, including outpatient wound care with [facility] and Dr. Leanord Hawking. Signed, Yvone Neu. Loreta Ave, DO   Ct Tibia Fibula Left Wo Contrast  06/26/2015  CLINICAL DATA:  82 year old female with infected wound in the mid aspect of the tibia and fibula. EXAM: CT TIBIA FIBULA LEFT WITHOUT CONTRAST TECHNIQUE: Multidetector CT imaging was performed according to the standard protocol. Multiplanar CT image reconstructions were also generated. COMPARISON:  06/26/2015 FINDINGS: Evaluation of this exam is limited in the absence of intravenous contrast. There is no acute fracture or dislocation. The bones are osteopenic. There is osteoarthritic changes of the knee with compartmental narrowing most prominent involving the medial compartment. There is diffuse subcutaneous soft tissue edema of the left lower extremity. There is diffuse thickening of the skin in the distal aspect of the left lower extremity and left ankle, likely related to chronic inflammation and cellulitis. No drainable fluid collection/abscess identified. Diffuse Vascular calcifications noted. IMPRESSION: Diffuse skin thickening and subcutaneous soft tissue edema. No drainable fluid collection/abscess. Electronically Signed   By: Elgie Collard M.D.   On: 06/26/2015 18:36   Mr Tibia Fibula Left Wo Contrast  07/02/2015  CLINICAL DATA:  Osteomyelitis. Atrial fibrillation. Chronic left leg wound with fever. Nonhealing ulcers. Recent revascularization. Fall. EXAM: MRI OF LOWER LEFT EXTREMITY WITHOUT CONTRAST TECHNIQUE: Multiplanar, multisequence MR imaging of the left tibia/ fibula. Was performed. No intravenous contrast was administered. COMPARISON:  06/26/2015 FINDINGS: Extensive subcutaneous edema proximally in both calves, right greater than left. There is associated cutaneous thickening. Edema tracks within along muscular groups in the calves, especially posteriorly on the left side.  Abnormal low T1 signal lateral  to the proximal tibia in the subcutaneous tissues with a bandlike configuration possibly from a wound in this vicinity, images 2-12 of series 7. There is potentially some cutaneous blistering along the lower left calf. No osteomyelitis observed.  No stress fracture identified. IMPRESSION: 1. Extensive bilateral subcutaneous edema. There is edema in the muscle groups of the calves bilaterally, but more notable on the left, which could be setrile or due to posterior compartment myositis. No gas in the soft tissues observed. The patient's striking hypoalbuminemia (2.0 grams/deciliter on 07/01/2015) may be contributing to this edema. 2. Scarring or wound medial to the right proximal tibia. 3. No drainable abscess or evidence of osteomyelitis. 4. Possible cutaneous blistering along the left lower calf. Electronically Signed   By: Gaylyn RongWalter  Liebkemann M.D.   On: 07/02/2015 12:16   Ir Koreas Guide Vasc Access Left  06/17/2015  INDICATION: 82 year old female with a history of chronic left lower extremity wound. Distribution of the wound suggests element of venous etiology. Prior left popliteal artery occlusion has been treated, restoring flow to the tibials. She presents today for venography, possible intervention. EXAM: LEFT EXTREMITY VENOGRAPHY; IR ULTRASOUND GUIDANCE VASC ACCESS LEFT COMPARISON:  CT 04/25/2015 MEDICATIONS: Patient received pretreatment for a contrast allergy with Solu-Medrol and Benadryl IV ANESTHESIA/SEDATION: Versed 0 mg IV; Fentanyl 25 mcg IV Moderate Sedation Time:  25 minutes The patient was continuously monitored during the procedure by the interventional radiology nurse under my direct supervision. FLUOROSCOPY TIME:  Fluoroscopy Time: 0 minutes 54 seconds (26 mGy). COMPLICATIONS: None TECHNIQUE: Informed written consent was obtained from the patient after a thorough discussion of the procedural risks, benefits and alternatives. All questions were addressed. Maximal  Sterile Barrier Technique was utilized including caps, mask, sterile gowns, sterile gloves, sterile drape, hand hygiene and skin antiseptic. A timeout was performed prior to the initiation of the procedure. Ultrasound survey of the left pedal veins was performed with images stored and sent to PACs. Once the patient is prepped and draped in usual sterile fashion, the skin was infiltrated with 1% lidocaine overlying the selected pedal vein for access. A 20 gauge angiocath was then inserted into the selected pedal vein using ultrasound guidance. With confirmed blood return, 1 cc contrast was infused to confirm intravenous position. CO2 venogram of the left lower extremity was performed first with no tourniquet in place, and a second with a tourniquet in place at the ankle. Ultrasound survey of the proximal left thigh was performed with images stored and sent to PACs. A micropuncture needle was used access the proximal left femoral vein under ultrasound. With venous blood flow returned, and an .018 micro wire was passed through the needle, observed enter the iliac vein under fluoroscopy. The needle was removed, and a micropuncture sheath was placed over the wire. The inner dilator and wire were removed, and an 035 Bentson wire was advanced under fluoroscopy into the pelvis. The sheath was removed and a standard 5 [place] vascular sheath was placed. The dilator was removed and the sheath was flushed. CO2 venogram and then a contrast venogram of the left iliac system was performed, with no occlusion or stenosis identified. Both the pedal access and the femoral vein access were removed with compression for hemostasis. Sterile bandages were placed. Patient tolerated the procedure well and remained hemodynamically stable throughout. No complications were encountered and no significant blood loss. FINDINGS: CO2 venogram of the left tibial venous system, both the superficial and the deep (without a tourniquet and with a  tourniquet in  place) demonstrate patency of the deep venous system, and continuous inline through the popliteal vein and femoral vein. Venogram of the left iliac system demonstrates no occlusion and no significant stenosis. No chronic occlusion was identified for treatment. IMPRESSION: Status post left lower extremity venography, confirming patency of the deep veins of the tibial system, and patency of the iliac system. The femoral vein has been documented to be patent on prior duplex ultrasound. No obstruction was found requiring treatment. My impression is that the venous contribution to the left lower extremity chronic wound is not from obstructed vein, though is exacerbated by poor foot muscle pump, calf muscle pump, elevated central venous pressures (cardiomegaly), and dependent positioning of the wound. Signed, Yvone Neu. Loreta Ave, DO [facility] Radiology Electronically Signed   By: Gilmer Mor D.O.   On: 06/17/2015 13:43   Ir US Guide Vasc Access Left  06/17/2015  INDICATION: 82 year old female with a history of chronic left lower extremity wound. Distribution of the wound suggests element of venous etiology. Prior left popliteal artery occlusion has been treated, restoring flow to the tibials. She presents today for venography, possible intervention. EXAM: LEFT EXTREMITY VENOGRAPHY; IR ULTRASOUND GUIDANCE VASC ACCESS LEFT COMPARISON:  CT 04/25/2015 MEDICATIONS: Patient received pretreatment for a contrast allergy with Solu-Medrol and Benadryl IV ANESTHESIA/SEDATION: Versed 0 mg IV; Fentanyl 25 mcg IV Moderate Sedation Time:  25 minutes The patient was continuously monitored during the procedure by the interventional radiology nurse under my direct supervision. FLUOROSCOPY TIME:  Fluoroscopy Time: 0 minutes 54 seconds (26 mGy). COMPLICATIONS: None TECHNIQUE: Informed written consent was obtained from the patient after a thorough discussion of the procedural  risks, benefits and alternatives. All questions were addressed. Maximal Sterile Barrier Technique was utilized including caps, mask, sterile gowns, sterile gloves, sterile drape, hand hygiene and skin antiseptic. A timeout was performed prior to the initiation of the procedure. Ultrasound survey of the left pedal veins was performed with images stored and sent to PACs. Once the patient is prepped and draped in usual sterile fashion, the skin was infiltrated with 1% lidocaine overlying the selected pedal vein for access. A 20 gauge angiocath was then inserted into the selected pedal vein using ultrasound guidance. With confirmed blood return, 1 cc contrast was infused to confirm intravenous position. CO2 venogram of the left lower extremity was performed first with no tourniquet in place, and a second with a tourniquet in place at the ankle. Ultrasound survey of the proximal left thigh was performed with images stored and sent to PACs. A micropuncture needle was used access the proximal left femoral vein under ultrasound. With venous blood flow returned, and an .018 micro wire was passed through the needle, observed enter the iliac vein under fluoroscopy. The needle was removed, and a micropuncture sheath was placed over the wire. The inner dilator and wire were removed, and an 035 Bentson wire was advanced under fluoroscopy into the pelvis. The sheath was removed and a standard 5 [place] vascular sheath was placed. The dilator was removed and the sheath was flushed. CO2 venogram and then a contrast venogram of the left iliac system was performed, with no occlusion or stenosis identified. Both the pedal access and the femoral vein access were removed with compression for hemostasis. Sterile bandages were placed. Patient tolerated the procedure well and remained hemodynamically stable throughout. No complications were encountered and no significant blood loss. FINDINGS: CO2 venogram of the left tibial venous system,  both the superficial and the  deep (without a tourniquet and with a tourniquet in place) demonstrate patency of the deep venous system, and continuous inline through the popliteal vein and femoral vein. Venogram of the left iliac system demonstrates no occlusion and no significant stenosis. No chronic occlusion was identified for treatment. IMPRESSION: Status post left lower extremity venography, confirming patency of the deep veins of the tibial system, and patency of the iliac system. The femoral vein has been documented to be patent on prior duplex ultrasound. No obstruction was found requiring treatment. My impression is that the venous contribution to the left lower extremity chronic wound is not from obstructed vein, though is exacerbated by poor foot muscle pump, calf muscle pump, elevated central venous pressures (cardiomegaly), and dependent positioning of the wound. Signed, Yvone Neu. Loreta Ave, DO [facility] Electronically Signed   By: Gilmer Mor D.O.   On: 06/17/2015 13:43   Dg Chest Port 1 View  06/26/2015  CLINICAL DATA:  Red and swollen LEFT leg EXAM: PORTABLE CHEST 1 VIEW COMPARISON:  Radiograph 06/10/2015 FINDINGS: Stable enlarged cardiac silhouette. Lungs are hyperinflated. There is chronic bronchitic markings. The patient's chin overlies the LEFT lung apex. No infiltrate or pneumothorax IMPRESSION: 1. Hyperinflated lungs with bronchitic change. 2. No acute findings. Electronically Signed   By: Genevive Bi M.D.   On: 06/26/2015 12:06   Dg Tibia/fibula Left Port  06/26/2015  CLINICAL DATA:  R50.9 (ICD-10-CM) - Fever RED AND SWOLLEN LT LEG Lower extremity ulceration (HCC) L97.909 EXAM: PORTABLE LEFT TIBIA AND FIBULA - 2 VIEW COMPARISON:  06/10/2015 FINDINGS: Increased periosteal reaction along the distal diaphysis of the LEFT fibula. Potential cortical erosion on the lateral projection is similar to comparison. IMPRESSION: New and  increased periosteal reaction involving the distal LEFT femur infant is concerning for osteomyelitis. Findings could also be found in peripheral vascular disease. Electronically Signed   By: Genevive Bi M.D.   On: 06/26/2015 12:14    Microbiology: No results found for this or any previous visit (from the past 240 hour(s)).   Labs: Basic Metabolic Panel:  Recent Labs Lab 07/03/15 0440 07/04/15 0516 07/05/15 0420 07/06/15 0440 07/07/15 0414  NA 141 140 139 140 140  K 4.5 3.6 3.9 3.5 4.9  CL 108 106 104 104 108  CO2 GLUCOSE 95 81 85 81 84  BUN 24*  CREATININE 1.09* 1.11* 1.00 0.91 1.34*  CALCIUM 8.4* 8.0* 8.2* 8.3* 8.3*  MG  --   --  1.6* 2.0 2.0   Liver Function Tests:  Recent Labs Lab 07/01/15 0429  AST 32  ALT 24  ALKPHOS 259*  BILITOT 0.7  PROT 6.5  ALBUMIN 2.0*   No results for input(s): LIPASE, AMYLASE in the last 168 hours. No results for input(s): AMMONIA in the last 168 hours. CBC:  Recent Labs Lab 07/01/15 0429 07/02/15 0456 07/03/15 0440 07/05/15 0420  WBC 6.2 4.7 5.4 4.7  HGB 9.7* 8.2* 8.6* 8.1*  HCT 31.6* 26.5* 27.6* 26.9*  MCV 94.0 93.6 94.2 94.7  PLT 152 125* 134* 128*   Cardiac Enzymes:  Recent Labs Lab 07/04/15 0516  CKTOTAL 44   BNP: BNP (last 3 results) No results for input(s): BNP in the last 8760 hours.  ProBNP (last 3 results) No results for input(s): PROBNP in the last 8760 hours.  CBG: No results for input(s): GLUCAP in the last 168 hours.     SignedAlbertine Grates MD, PhD  [facility] 07/07/2015, 11:49 AM

## 2015-07-07 NOTE — Progress Notes (Signed)
Dressing to Lt lower extremity changed, Pt given discharge instructions, son [name] brought clothes from home. SW contacted to sent up transportation and [facility] and IV and Tele removed. Belonging packed up Son took belongings home.

## 2015-07-07 NOTE — Care Management Note (Signed)
Case Management Note  Patient Details  Name: Kristina Diaz MRN: [id number] Date of Birth: 07/11/1929  Subjective/Objective:                  fever Action/Plan: Discharge planning Expected Discharge Date:  07/07/15               Expected Discharge Plan:  Assisted Living / Rest Home  In-House Referral:  Clinical Social Work  Discharge planning Services  CM Consult  Post Acute Care Choice:  Home Health Choice offered to:  Patient  DME Arranged:    DME Agency:     HH Arranged:  RN, PT, Nurse's Aide [place] Agency:  [facility]  Status of Service:  Completed, signed off  Medicare Important Message Given:  Yes Date Medicare IM Given:    Medicare IM give by:    Date Additional Medicare IM Given:    Additional Medicare Important Message give by:     If discussed at [place] of Stay Meetings, dates discussed:    Additional Comments: CM met with pt to confirm [facility] as her home health agency. Pt confirms.  Cm called [facility] rep, Dona to notify of discharge.  RN has contacted CSW for transport home.  No other CM needs were communicated. Dellie Catholic, RN 07/07/2015, 12:17 PM

## 2015-07-08 ENCOUNTER — Telehealth: Payer: Self-pay | Admitting: *Deleted

## 2015-07-08 NOTE — Telephone Encounter (Signed)
Pt was on TCM list D/C 07/07/15 will f/u with [facility]...Raechel Chute/lmb

## 2015-07-10 ENCOUNTER — Telehealth: Payer: Self-pay | Admitting: Internal Medicine

## 2015-07-10 NOTE — Telephone Encounter (Signed)
Pt has been discharge from [facility] on Sunday. Kristina Diaz would like to know if she can have orders  for PT and nursing. Patient INR was 1.8 and protime 21.7. Pt is on 2 mg coumadin daily. Kristina Diaz has an order from hospital to check her protime every 3 day for 3 check only.Kristina Diaz would like to know should they continue check her protime .

## 2015-07-11 NOTE — Telephone Encounter (Signed)
Arline AspCindy, will you contact Marylene Landngela and give orders for Mrs Marisa [employer]. Thanks

## 2015-07-11 NOTE — Telephone Encounter (Signed)
VO given to AynorAngela, [facility] to check [facility]R 3/17.

## 2015-07-11 NOTE — Telephone Encounter (Signed)
Spoke to [employer] Script[employer]ela, verbal  Orders given for PT and Nursing for pt, okay per Dr.K. Marylene Landngela verbalized understanding. Told her in regards to coumadin check I would do what hospital discharge instructions say for now and I will send Bailey MechCindy Boyd the message. She follows pt for PT/INR and she will get back to you with orders. Marylene Landngela verbalized understanding.

## 2015-07-12 ENCOUNTER — Ambulatory Visit (INDEPENDENT_AMBULATORY_CARE_PROVIDER_SITE_OTHER): Payer: Medicare Other | Admitting: General Practice

## 2015-07-12 DIAGNOSIS — I4891 Unspecified atrial fibrillation: Secondary | ICD-10-CM

## 2015-07-12 DIAGNOSIS — Z5181 Encounter for therapeutic drug level monitoring: Secondary | ICD-10-CM

## 2015-07-12 LAB — POCT INR
INR: 1.8
INR: 2

## 2015-07-12 NOTE — Progress Notes (Signed)
Pre visit review using our clinic review tool, if applicable. No additional management support is needed unless otherwise documented below in the visit note. 

## 2015-07-12 NOTE — Progress Notes (Signed)
I have reviewed and agree with the plan. 

## 2015-07-15 ENCOUNTER — Telehealth: Payer: Self-pay | Admitting: Internal Medicine

## 2015-07-15 NOTE — Telephone Encounter (Signed)
Kristina Diaz with Kristina Diaz states pt went to ED. While there, they changed her order for her oxyCODONE (OXYCONTIN) 10 mg 12 hr tablet [[id number]][[id number]]  ED changed to a 5mg . Kristina Diaz states they keep changing every time she goes to ED. [name] to fax over the order, just to switch back.  They still have enough pills.

## 2015-07-16 DIAGNOSIS — L97511 Non-pressure chronic ulcer of other part of right foot limited to breakdown of skin: Secondary | ICD-10-CM | POA: Diagnosis not present

## 2015-07-16 DIAGNOSIS — I890 Lymphedema, not elsewhere classified: Secondary | ICD-10-CM | POA: Diagnosis not present

## 2015-07-16 DIAGNOSIS — L97322 Non-pressure chronic ulcer of left ankle with fat layer exposed: Secondary | ICD-10-CM | POA: Diagnosis not present

## 2015-07-16 DIAGNOSIS — L89620 Pressure ulcer of left heel, unstageable: Secondary | ICD-10-CM | POA: Diagnosis not present

## 2015-07-16 DIAGNOSIS — I110 Hypertensive heart disease with heart failure: Secondary | ICD-10-CM | POA: Diagnosis not present

## 2015-07-16 DIAGNOSIS — I70238 Atherosclerosis of native arteries of right leg with ulceration of other part of lower right leg: Secondary | ICD-10-CM | POA: Diagnosis not present

## 2015-07-16 DIAGNOSIS — I509 Heart failure, unspecified: Secondary | ICD-10-CM | POA: Diagnosis not present

## 2015-07-16 DIAGNOSIS — I70248 Atherosclerosis of native arteries of left leg with ulceration of other part of lower left leg: Secondary | ICD-10-CM | POA: Diagnosis not present

## 2015-07-16 DIAGNOSIS — Z86718 Personal history of other venous thrombosis and embolism: Secondary | ICD-10-CM | POA: Diagnosis not present

## 2015-07-16 DIAGNOSIS — L97822 Non-pressure chronic ulcer of other part of left lower leg with fat layer exposed: Secondary | ICD-10-CM | POA: Diagnosis not present

## 2015-07-16 DIAGNOSIS — L97819 Non-pressure chronic ulcer of other part of right lower leg with unspecified severity: Secondary | ICD-10-CM | POA: Diagnosis present

## 2015-07-16 DIAGNOSIS — I87313 Chronic venous hypertension (idiopathic) with ulcer of bilateral lower extremity: Secondary | ICD-10-CM | POA: Diagnosis not present

## 2015-07-16 DIAGNOSIS — L97521 Non-pressure chronic ulcer of other part of left foot limited to breakdown of skin: Secondary | ICD-10-CM | POA: Diagnosis not present

## 2015-07-16 NOTE — Telephone Encounter (Signed)
[facility] order for pt to continue Oxycodone 10 mg. [facility].

## 2015-07-17 ENCOUNTER — Telehealth: Payer: Self-pay | Admitting: Internal Medicine

## 2015-07-17 NOTE — Telephone Encounter (Signed)
Kristina Diaz is calling requesting verbal order for pt twice a wk for 2 wks for mobility and strengthening. Pt bp is very low 82/56 heart rate 117. Pt say she feel fine. Please advise. Kristina Diaz is aware md out of office

## 2015-07-19 ENCOUNTER — Telehealth: Payer: Self-pay | Admitting: Internal Medicine

## 2015-07-19 ENCOUNTER — Ambulatory Visit (INDEPENDENT_AMBULATORY_CARE_PROVIDER_SITE_OTHER): Payer: Medicare Other | Admitting: General Practice

## 2015-07-19 DIAGNOSIS — I4891 Unspecified atrial fibrillation: Secondary | ICD-10-CM

## 2015-07-19 DIAGNOSIS — Z5181 Encounter for therapeutic drug level monitoring: Secondary | ICD-10-CM

## 2015-07-19 LAB — POCT INR: INR: 5.6

## 2015-07-19 NOTE — Telephone Encounter (Signed)
Kristina BossAngela Jones [facility] to report the following          INR 5.6 pt 67.0

## 2015-07-19 NOTE — Telephone Encounter (Signed)
ok 

## 2015-07-19 NOTE — Telephone Encounter (Signed)
New coag orders given to [place]ela, LPN [facility].

## 2015-07-19 NOTE — Telephone Encounter (Signed)
Called Marcelino DusterMichelle and left message on personal voicemail, verbal order given for pt twice a wk for 2 wks for mobility and strengthening okay per Dr.K. As far as blood pressure and heart rate, continue to monitor if pt becomes symptomatic please call office. Any questions please call office.

## 2015-07-19 NOTE — Progress Notes (Signed)
Pre visit review using our clinic review tool, if applicable. No additional management support is needed unless otherwise documented below in the visit note. 

## 2015-07-20 NOTE — Progress Notes (Signed)
I have reviewed and agree with the plan. 

## 2015-07-22 ENCOUNTER — Telehealth: Payer: Self-pay | Admitting: Internal Medicine

## 2015-07-22 ENCOUNTER — Inpatient Hospital Stay (HOSPITAL_COMMUNITY)
Admission: EM | Admit: 2015-07-22 | Discharge: 2015-07-26 | DRG: 871 | Disposition: A | Discharge status: Hospice | Payer: Medicare Other | Attending: Internal Medicine | Admitting: Internal Medicine

## 2015-07-22 ENCOUNTER — Encounter (HOSPITAL_COMMUNITY): Payer: Self-pay

## 2015-07-22 ENCOUNTER — Inpatient Hospital Stay (HOSPITAL_COMMUNITY): Payer: Medicare Other

## 2015-07-22 DIAGNOSIS — L97229 Non-pressure chronic ulcer of left calf with unspecified severity: Secondary | ICD-10-CM | POA: Diagnosis present

## 2015-07-22 DIAGNOSIS — I110 Hypertensive heart disease with heart failure: Secondary | ICD-10-CM | POA: Diagnosis present

## 2015-07-22 DIAGNOSIS — Z91041 Radiographic dye allergy status: Secondary | ICD-10-CM

## 2015-07-22 DIAGNOSIS — Z7189 Other specified counseling: Secondary | ICD-10-CM | POA: Diagnosis not present

## 2015-07-22 DIAGNOSIS — I452 Bifascicular block: Secondary | ICD-10-CM | POA: Diagnosis present

## 2015-07-22 DIAGNOSIS — E079 Disorder of thyroid, unspecified: Secondary | ICD-10-CM | POA: Diagnosis present

## 2015-07-22 DIAGNOSIS — Z801 Family history of malignant neoplasm of trachea, bronchus and lung: Secondary | ICD-10-CM | POA: Diagnosis not present

## 2015-07-22 DIAGNOSIS — D638 Anemia in other chronic diseases classified elsewhere: Secondary | ICD-10-CM | POA: Diagnosis present

## 2015-07-22 DIAGNOSIS — M79604 Pain in right leg: Secondary | ICD-10-CM | POA: Diagnosis not present

## 2015-07-22 DIAGNOSIS — R791 Abnormal coagulation profile: Secondary | ICD-10-CM | POA: Diagnosis present

## 2015-07-22 DIAGNOSIS — I5042 Chronic combined systolic (congestive) and diastolic (congestive) heart failure: Secondary | ICD-10-CM | POA: Diagnosis present

## 2015-07-22 DIAGNOSIS — Z803 Family history of malignant neoplasm of breast: Secondary | ICD-10-CM | POA: Diagnosis not present

## 2015-07-22 DIAGNOSIS — A419 Sepsis, unspecified organism: Secondary | ICD-10-CM | POA: Diagnosis present

## 2015-07-22 DIAGNOSIS — L97219 Non-pressure chronic ulcer of right calf with unspecified severity: Secondary | ICD-10-CM | POA: Diagnosis present

## 2015-07-22 DIAGNOSIS — Z86718 Personal history of other venous thrombosis and embolism: Secondary | ICD-10-CM | POA: Diagnosis not present

## 2015-07-22 DIAGNOSIS — T148XXA Other injury of unspecified body region, initial encounter: Secondary | ICD-10-CM

## 2015-07-22 DIAGNOSIS — L8990 Pressure ulcer of unspecified site, unspecified stage: Secondary | ICD-10-CM

## 2015-07-22 DIAGNOSIS — F411 Generalized anxiety disorder: Secondary | ICD-10-CM | POA: Diagnosis not present

## 2015-07-22 DIAGNOSIS — Z883 Allergy status to other anti-infective agents status: Secondary | ICD-10-CM | POA: Diagnosis not present

## 2015-07-22 DIAGNOSIS — I959 Hypotension, unspecified: Secondary | ICD-10-CM | POA: Diagnosis present

## 2015-07-22 DIAGNOSIS — Z7901 Long term (current) use of anticoagulants: Secondary | ICD-10-CM

## 2015-07-22 DIAGNOSIS — L03115 Cellulitis of right lower limb: Secondary | ICD-10-CM | POA: Diagnosis present

## 2015-07-22 DIAGNOSIS — Z881 Allergy status to other antibiotic agents status: Secondary | ICD-10-CM | POA: Diagnosis not present

## 2015-07-22 DIAGNOSIS — L03116 Cellulitis of left lower limb: Secondary | ICD-10-CM | POA: Diagnosis present

## 2015-07-22 DIAGNOSIS — Z515 Encounter for palliative care: Secondary | ICD-10-CM | POA: Diagnosis not present

## 2015-07-22 DIAGNOSIS — E039 Hypothyroidism, unspecified: Secondary | ICD-10-CM | POA: Diagnosis present

## 2015-07-22 DIAGNOSIS — R64 Cachexia: Secondary | ICD-10-CM | POA: Diagnosis present

## 2015-07-22 DIAGNOSIS — I739 Peripheral vascular disease, unspecified: Secondary | ICD-10-CM | POA: Diagnosis present

## 2015-07-22 DIAGNOSIS — Z91048 Other nonmedicinal substance allergy status: Secondary | ICD-10-CM | POA: Diagnosis not present

## 2015-07-22 DIAGNOSIS — R6521 Severe sepsis with septic shock: Secondary | ICD-10-CM | POA: Diagnosis present

## 2015-07-22 DIAGNOSIS — N179 Acute kidney failure, unspecified: Secondary | ICD-10-CM | POA: Diagnosis present

## 2015-07-22 DIAGNOSIS — F419 Anxiety disorder, unspecified: Secondary | ICD-10-CM | POA: Diagnosis present

## 2015-07-22 DIAGNOSIS — I482 Chronic atrial fibrillation: Secondary | ICD-10-CM | POA: Diagnosis present

## 2015-07-22 DIAGNOSIS — Z6821 Body mass index (BMI) 21.0-21.9, adult: Secondary | ICD-10-CM

## 2015-07-22 DIAGNOSIS — I4891 Unspecified atrial fibrillation: Secondary | ICD-10-CM | POA: Diagnosis present

## 2015-07-22 DIAGNOSIS — G9341 Metabolic encephalopathy: Secondary | ICD-10-CM | POA: Diagnosis present

## 2015-07-22 DIAGNOSIS — M79605 Pain in left leg: Secondary | ICD-10-CM | POA: Diagnosis not present

## 2015-07-22 DIAGNOSIS — L03119 Cellulitis of unspecified part of limb: Secondary | ICD-10-CM | POA: Diagnosis not present

## 2015-07-22 DIAGNOSIS — Z7982 Long term (current) use of aspirin: Secondary | ICD-10-CM | POA: Diagnosis not present

## 2015-07-22 DIAGNOSIS — L089 Local infection of the skin and subcutaneous tissue, unspecified: Secondary | ICD-10-CM

## 2015-07-22 DIAGNOSIS — R531 Weakness: Secondary | ICD-10-CM

## 2015-07-22 DIAGNOSIS — G894 Chronic pain syndrome: Secondary | ICD-10-CM | POA: Diagnosis present

## 2015-07-22 DIAGNOSIS — T148 Other injury of unspecified body region: Secondary | ICD-10-CM

## 2015-07-22 DIAGNOSIS — R609 Edema, unspecified: Secondary | ICD-10-CM | POA: Diagnosis present

## 2015-07-22 DIAGNOSIS — Z66 Do not resuscitate: Secondary | ICD-10-CM | POA: Diagnosis present

## 2015-07-22 LAB — COMPREHENSIVE METABOLIC PANEL
ALBUMIN: 2.4 g/dL — AB (ref 3.5–5.0)
ALBUMIN: 1.5 g/dL — AB (ref 3.5–5.0)
ALK PHOS: 273 U/L — AB (ref 38–126)
ALT: 27 U/L (ref 14–54)
ALT: 19 U/L (ref 14–54)
AST: 51 U/L — AB (ref 15–41)
AST: 35 U/L (ref 15–41)
Alkaline Phosphatase: 184 U/L — ABNORMAL HIGH (ref 38–126)
Anion gap: 17 — ABNORMAL HIGH (ref 5–15)
Anion gap: 10 (ref 5–15)
BILIRUBIN TOTAL: 1.1 mg/dL (ref 0.3–1.2)
BUN: 106 mg/dL — AB (ref 6–20)
BUN: 80 mg/dL — ABNORMAL HIGH (ref 6–20)
CALCIUM: 8.9 mg/dL (ref 8.9–10.3)
CHLORIDE: 119 mmol/L — AB (ref 101–111)
CO2: 19 mmol/L — ABNORMAL LOW (ref 22–32)
CO2: 15 mmol/L — ABNORMAL LOW (ref 22–32)
CREATININE: 3.01 mg/dL — AB (ref 0.44–1.00)
Calcium: 6.2 mg/dL — CL (ref 8.9–10.3)
Chloride: 107 mmol/L (ref 101–111)
Creatinine, Ser: 4.26 mg/dL — ABNORMAL HIGH (ref 0.44–1.00)
GFR calc Af Amer: 10 mL/min — ABNORMAL LOW (ref >60)
GFR calc non Af Amer: 9 mL/min — ABNORMAL LOW (ref >60)
GFR calc non Af Amer: 13 mL/min — ABNORMAL LOW (ref >60)
GFR, EST AFRICAN AMERICAN: 15 mL/min — AB (ref >60)
GLUCOSE: 69 mg/dL (ref 65–99)
Glucose, Bld: 60 mg/dL — ABNORMAL LOW (ref 65–99)
Potassium: 5.3 mmol/L — ABNORMAL HIGH (ref 3.5–5.1)
Potassium: 3.6 mmol/L (ref 3.5–5.1)
SODIUM: 144 mmol/L (ref 135–145)
Sodium: 143 mmol/L (ref 135–145)
TOTAL PROTEIN: 6.8 g/dL (ref 6.5–8.1)
TOTAL PROTEIN: 4.4 g/dL — AB (ref 6.5–8.1)
Total Bilirubin: 1 mg/dL (ref 0.3–1.2)

## 2015-07-22 LAB — CBC WITH DIFFERENTIAL/PLATELET
BASOS ABS: 0 10*3/uL (ref 0.0–0.1)
BASOS PCT: 0 %
BASOS PCT: 0 %
Basophils Absolute: 0 10*3/uL (ref 0.0–0.1)
EOS ABS: 0 10*3/uL (ref 0.0–0.7)
EOS PCT: 0 %
EOS PCT: 0 %
Eosinophils Absolute: 0 10*3/uL (ref 0.0–0.7)
HCT: 23 % — ABNORMAL LOW (ref 36.0–46.0)
HEMATOCRIT: 29.8 % — AB (ref 36.0–46.0)
Hemoglobin: 9.9 g/dL — ABNORMAL LOW (ref 12.0–15.0)
Hemoglobin: 7.6 g/dL — ABNORMAL LOW (ref 12.0–15.0)
LYMPHS ABS: 1 10*3/uL (ref 0.7–4.0)
Lymphocytes Relative: 5 %
Lymphocytes Relative: 8 %
Lymphs Abs: 1 10*3/uL (ref 0.7–4.0)
MCH: 29.6 pg (ref 26.0–34.0)
MCH: 29.1 pg (ref 26.0–34.0)
MCHC: 33.2 g/dL (ref 30.0–36.0)
MCHC: 33 g/dL (ref 30.0–36.0)
MCV: 89 fL (ref 78.0–100.0)
MCV: 88.1 fL (ref 78.0–100.0)
MONO ABS: 1.6 10*3/uL — AB (ref 0.1–1.0)
Monocytes Absolute: 0.7 10*3/uL (ref 0.1–1.0)
Monocytes Relative: 8 %
Monocytes Relative: 6 %
NEUTROS PCT: 86 %
Neutro Abs: 18 10*3/uL — ABNORMAL HIGH (ref 1.7–7.7)
Neutro Abs: 10.6 10*3/uL — ABNORMAL HIGH (ref 1.7–7.7)
Neutrophils Relative %: 87 %
PLATELETS: 157 10*3/uL (ref 150–400)
Platelets: 225 10*3/uL (ref 150–400)
RBC: 3.35 MIL/uL — ABNORMAL LOW (ref 3.87–5.11)
RBC: 2.61 MIL/uL — AB (ref 3.87–5.11)
RDW: 19.3 % — AB (ref 11.5–15.5)
RDW: 19.3 % — ABNORMAL HIGH (ref 11.5–15.5)
WBC: 20.6 10*3/uL — ABNORMAL HIGH (ref 4.0–10.5)
WBC: 12.4 10*3/uL — AB (ref 4.0–10.5)

## 2015-07-22 LAB — PROTIME-INR
INR: 9.49 (ref 0.00–1.49)
INR: >10 (ref 0.00–1.49)
Prothrombin Time: 72.8 seconds — ABNORMAL HIGH (ref 11.6–15.2)
Prothrombin Time: >90 seconds — ABNORMAL HIGH (ref 11.6–15.2)

## 2015-07-22 LAB — I-STAT CG4 LACTIC ACID, ED
LACTIC ACID, VENOUS: 1.16 mmol/L (ref 0.5–2.0)
Lactic Acid, Venous: 2.57 mmol/L (ref 0.5–2.0)
Lactic Acid, Venous: 2.24 mmol/L (ref 0.5–2.0)

## 2015-07-22 LAB — PROCALCITONIN
PROCALCITONIN: 1.81 ng/mL
Procalcitonin: 1.8 ng/mL

## 2015-07-22 LAB — LACTIC ACID, PLASMA: LACTIC ACID, VENOUS: 1.4 mmol/L (ref 0.5–2.0)

## 2015-07-22 LAB — APTT: APTT: 75 s — AB (ref 24–37)

## 2015-07-22 MED ORDER — SODIUM CHLORIDE 0.9 % IV BOLUS (SEPSIS)
1000.0000 mL | Freq: Once | INTRAVENOUS | Status: AC
  Administered 2015-07-22: 1000 mL via INTRAVENOUS

## 2015-07-22 MED ORDER — SODIUM CHLORIDE 0.9 % IV BOLUS (SEPSIS)
500.0000 mL | INTRAVENOUS | Status: AC
  Administered 2015-07-22: 500 mL via INTRAVENOUS

## 2015-07-22 MED ORDER — VANCOMYCIN HCL IN DEXTROSE 1-5 GM/200ML-% IV SOLN
1000.0000 mg | Freq: Once | INTRAVENOUS | Status: AC
  Administered 2015-07-22: 1000 mg via INTRAVENOUS
  Filled 2015-07-22: qty 200

## 2015-07-22 MED ORDER — PIPERACILLIN-TAZOBACTAM 3.375 G IVPB 30 MIN
3.3750 g | Freq: Once | INTRAVENOUS | Status: AC
  Administered 2015-07-22: 3.375 g via INTRAVENOUS
  Filled 2015-07-22: qty 50

## 2015-07-22 MED ORDER — VANCOMYCIN HCL IN DEXTROSE 1-5 GM/200ML-% IV SOLN: 1000.0000 mg | Freq: Once | INTRAVENOUS | Status: DC

## 2015-07-22 MED ORDER — POLYETHYLENE GLYCOL 3350 17 G PO PACK
17.0000 g | PACK | Freq: Every day | ORAL | Status: DC | PRN
  Filled 2015-07-22: qty 1

## 2015-07-22 MED ORDER — PIPERACILLIN-TAZOBACTAM IN DEX 2-0.25 GM/50ML IV SOLN
2.2500 g | Freq: Three times a day (TID) | INTRAVENOUS | Status: DC
  Administered 2015-07-23 – 2015-07-26 (×11): 2.25 g via INTRAVENOUS
  Filled 2015-07-22 (×21): qty 50

## 2015-07-22 MED ORDER — ONDANSETRON HCL 4 MG PO TABS: 4.0000 mg | ORAL_TABLET | Freq: Four times a day (QID) | ORAL | Status: DC | PRN

## 2015-07-22 MED ORDER — MORPHINE SULFATE (PF) 2 MG/ML IV SOLN: 1.0000 mg | INTRAVENOUS | Status: DC | PRN

## 2015-07-22 MED ORDER — TRAZODONE HCL 50 MG PO TABS: 25.0000 mg | ORAL_TABLET | Freq: Every evening | ORAL | Status: DC | PRN

## 2015-07-22 MED ORDER — ACETAMINOPHEN 325 MG PO TABS: 650.0000 mg | ORAL_TABLET | Freq: Four times a day (QID) | ORAL | Status: DC | PRN

## 2015-07-22 MED ORDER — HYDROCODONE-ACETAMINOPHEN 5-325 MG PO TABS
1.0000 | ORAL_TABLET | ORAL | Status: DC | PRN
  Administered 2015-07-23 – 2015-07-25 (×5): 1 via ORAL
  Filled 2015-07-22 (×5): qty 1

## 2015-07-22 MED ORDER — MAGNESIUM CITRATE PO SOLN: 1.0000 | Freq: Once | ORAL | Status: DC | PRN

## 2015-07-22 MED ORDER — WARFARIN SODIUM 2 MG PO TABS: 2.0000 mg | ORAL_TABLET | Freq: Every day | ORAL | Status: DC

## 2015-07-22 MED ORDER — SODIUM CHLORIDE 0.9 % IV SOLN
INTRAVENOUS | Status: AC
  Administered 2015-07-22: 17:00:00 via INTRAVENOUS

## 2015-07-22 MED ORDER — PIPERACILLIN-TAZOBACTAM 3.375 G IVPB 30 MIN: 3.3750 g | Freq: Once | INTRAVENOUS | Status: DC

## 2015-07-22 MED ORDER — VANCOMYCIN HCL IN DEXTROSE 750-5 MG/150ML-% IV SOLN
750.0000 mg | Freq: Once | INTRAVENOUS | Status: DC
  Filled 2015-07-22: qty 150

## 2015-07-22 MED ORDER — VANCOMYCIN HCL IN DEXTROSE 1-5 GM/200ML-% IV SOLN
1000.0000 mg | Freq: Once | INTRAVENOUS | Status: DC
Start: 2015-07-22 — End: 2015-07-22

## 2015-07-22 MED ORDER — LEVOTHYROXINE SODIUM 75 MCG PO TABS
37.5000 ug | ORAL_TABLET | Freq: Every day | ORAL | Status: DC
  Administered 2015-07-23 – 2015-07-25 (×3): 37.5 ug via ORAL
  Filled 2015-07-22 (×2): qty 1
  Filled 2015-07-22: qty 0.5

## 2015-07-22 MED ORDER — ACETAMINOPHEN 650 MG RE SUPP: 650.0000 mg | Freq: Four times a day (QID) | RECTAL | Status: DC | PRN

## 2015-07-22 MED ORDER — ONDANSETRON HCL 4 MG/2ML IJ SOLN: 4.0000 mg | Freq: Four times a day (QID) | INTRAMUSCULAR | Status: DC | PRN

## 2015-07-22 NOTE — Progress Notes (Addendum)
Pharmacy Antibiotic Note  Kristina Diaz is a 82 y.o. female admitted on 07/22/2015 with cellulitis and sepsis.  Pharmacy has been consulted for vancomycin and zosyn dosing. She has been on doxycycline since 07/07/15.  Wt 43 kg.  WBC elevated at 20.6 with ANC 18.  Creat elevated at 4.26- acute renal failure, creat cl < 10 ml/min, lactate elevated 2.57.  Hypothermic 96.2. HR 102, RR 18. On doxycycline since 07/07/15 - last dose today. Pt from SNF with B LL open wounds that have been dressed by wound center- changed today. Wounds with continued drainage, redness and swelling.   Plan: zosyn 3.375 gm IV x 1 dose then 2.25 gm q8h Vancomycin 1000 mg IV x 1 dose then f/u renal function for further dosing F/u renal fxn, wbc, temp, culture data.  Height: 5\' 3"  (160 cm) Weight: 95 lb (43.092 kg) IBW/kg (Calculated) : 52.4  Temp (24hrs), Avg:96.2 F (35.7 C), Min:96.2 F (35.7 C), Max:96.2 F (35.7 C)   Recent Labs Lab 07/22/15 1328 07/22/15 1343  WBC 20.6*  --   CREATININE 4.26*  --   LATICACIDVEN  --  2.57*    Estimated Creatinine Clearance: 6.8 mL/min (by C-G formula based on Cr of 4.26).    Allergies  Allergen Reactions  . Bactrim Ds [Sulfamethoxazole-Trimethoprim] Rash  . Contrast Media [Iodinated Diagnostic Agents] Hives    BLE erythema   . Ciprofloxacin Rash  . Tape Rash    Antimicrobials this admission: vanc 3/27>> Zosyn 3/27>>  Dose adjustments this admission:   Microbiology results: 3/27 BCx2 3/27 UCx   Thank you for allowing pharmacy to be a part of this patient's care.  Herby AbrahamMichelle T. Ashante Snelling, Pharm.D. [phone] 07/22/2015 3:24 PM

## 2015-07-22 NOTE — ED Notes (Signed)
Admitting PA made aware that pt is refusing CXR at this time.

## 2015-07-22 NOTE — ED Notes (Signed)
Merrell MD paged and made aware of pt blood pressure.  MD is comfortable with pressure at this time and no new orders received.

## 2015-07-22 NOTE — Telephone Encounter (Signed)
FYI Pt has elevate heart rate 127 and labored breathing. Pt is at [place] now

## 2015-07-22 NOTE — H&P (Signed)
[facility] History and Physical  Kristina Noelancy Gayle ZOX:[id number] DOB: 12/15/1929 DOA: 07/22/2015  Referring physician: EDP PCP: [name],PETER FRANK, MD   Chief Complaint: Sepsis                               Acute Mental Status Changes                               Recurrent LE cellulitis  HPI: Kristina Diaz is a 82 y.o. female history of afebrile on Coumadin, PT PTT, CHF, ejection fraction 45-50%, history of left lower extremity DVT, chronic left leg wound, with recurrent hospitalization for cellulitis on 06/26/2015 and 2/12 2017, now brought from Nursing Home with AMS, persistent LLE redness and drainage.  At the ED, BP 92/50 mmHg  Pulse 108  Temp(Src) 97.5 F (36.4 C) (Oral)  Resp 10  Ht 5\' 3"  (1.6 m)  Wt 43.092 kg (95 lb)  BMI 16.83 kg/m2  SpO2 100%  WBC 20.6. Lactic acid 2.57 EKG     She received Ceftin IV with mild improvement of her mental status. Questioning is limited, she falls asleep frequently. NO apparent dysuria, frequency, chest pain, worsening shortness of breath, back pain,  fevers, cough, headache, neck stiffness.     Review of Systems:  See HPI,otherwise unable to obtain further details due to mild somnolence and mild confusion  Past Medical History  Diagnosis Date  . PAD (peripheral artery disease) (HCC) 2013    left leg. sounds like she had angio-plasty   . Atrial fibrillation (HCC) 2013    [facility]  . Hypertension   . Osteoporosis   . DVT (deep venous thrombosis) (HCC) 2013    "LLE" (08/04/2012)  . Hypothyroidism   . History of blood transfusion 2013    "related to thrombectomy" (08/04/2012)  . Anemia     "slightly" (08/04/2012)  . Arthritis     "probably minor; knees, pinky" (08/04/2012)  . Fracture of L4 vertebra (HCC)     "dx'd in 2013; can't treat til legs fixed" (08/04/2012)  . Lower extremity ulceration (HCC)   . Biventricular heart failure with reduced left ventricular function [facility](HCC)    Past Surgical History    Procedure Laterality Date  . Thrombectomy Left 2013    "leg" (08/04/2012)  . G2p2    . Tonsillectomy and adenoidectomy  ~ 1938  . Dilation and curettage of uterus  1960's   Social History:  reports that she has never smoked. She has never used smokeless tobacco. She reports that she drinks alcohol. She reports that she does not use illicit drugs.  Allergies  Allergen Reactions  . Bactrim Ds [Sulfamethoxazole-Trimethoprim] Rash  . Contrast Media [Iodinated Diagnostic Agents] Hives    BLE erythema   . Ciprofloxacin Rash  . Tape Rash    Family History  Problem Relation Age of Onset  . Arthritis Mother   . Cancer Mother     breast cancer   . Cancer Brother     lung cancer  . Tuberculosis Father   . Cancer Sister     stg 4 breast cancer  . Diabetes Neg Hx   . Heart disease Neg Hx   . COPD Neg Hx      Prior to Admission medications   Medication Sig Start Date End Date Taking? Authorizing Provider  Amino Acids-Protein Hydrolys (FEEDING SUPPLEMENT, PRO-STAT  SUGAR FREE 64,) LIQD Take 30 mLs by mouth 2 (two) times daily.   Yes Historical Provider, MD  aspirin EC 81 MG EC tablet Take 1 tablet (81 mg total) by mouth daily. 05/13/15  Yes Maryann Mikhail, DO  docusate sodium (COLACE) 100 MG capsule Take 100 mg by mouth 2 (two) times daily.   Yes Historical Provider, MD  doxycycline (VIBRA-TABS) 100 MG tablet Take 1 tablet (100 mg total) by mouth every 12 (twelve) hours. 07/07/15  Yes Albertine Grates, MD  feeding supplement, ENSURE ENLIVE, (ENSURE ENLIVE) LIQD Take 237 mLs by mouth 2 (two) times daily between meals. 06/20/15  Yes Meredeth Ide, MD  levothyroxine (SYNTHROID, LEVOTHROID) 25 MCG tablet TAKE 1 AND 1/2 TABS (37.5MG ) BY MOUTH EVERY DAY*CHECK PULSE WEEKLY* 07/14/12  Yes Jacques Navy, MD  metoprolol succinate (TOPROL-XL) 25 MG 24 hr tablet Take 0.5 tablets (12.5 mg total) by mouth daily. 11/01/12  Yes Jacques Navy, MD  Multiple Vitamin (MULTIVITAMIN WITH MINERALS) TABS Take 1 tablet by  mouth every morning.   Yes Historical Provider, MD  oxyCODONE (OXY IR/ROXICODONE) 5 MG immediate release tablet Take 5 mg by mouth every 6 (six) hours as needed for severe pain.   Yes Historical Provider, MD  oxyCODONE (OXYCONTIN) 10 mg 12 hr tablet Take 10 mg by mouth every 12 (twelve) hours.   Yes Historical Provider, MD  torsemide (DEMADEX) 20 MG tablet Take 1 tablet (20 mg total) by mouth daily. Reported on 06/10/2015 07/09/15  Yes Albertine Grates, MD  warfarin (COUMADIN) 2 MG tablet Take 2 mg by mouth daily.   Yes Historical Provider, MD  Amino Acids-Protein Hydrolys (FEEDING SUPPLEMENT, PRO-STAT SUGAR FREE 64,) LIQD Take 30 mLs by mouth 2 (two) times daily.    Historical Provider, MD  collagenase (SANTYL) ointment Apply topically daily. Patient not taking: Reported on 07/22/2015 07/07/15   Albertine Grates, MD  docusate sodium (COLACE) 100 MG capsule Take 100 mg by mouth daily.    Historical Provider, MD  oxyCODONE (OXY IR/ROXICODONE) 5 MG immediate release tablet Take 1 tablet (5 mg total) by mouth every 6 (six) hours as needed for moderate pain or severe pain. Patient not taking: Reported on 07/22/2015 07/07/15   Albertine Grates, MD  warfarin (COUMADIN) 2 MG tablet Take 1 tablet (2 mg total) by mouth daily at 6 PM. Patient not taking: Reported on 07/22/2015 07/07/15   Albertine Grates, MD   Physical Exam: Filed Vitals:   07/22/15 1254 07/22/15 1330 07/22/15 1530 07/22/15 1546  BP: 101/55 98/64 91/45  92/50  Pulse: 102 108 109 108  Temp: 96.2 F (35.7 C)   97.5 F (36.4 C)  TempSrc: Rectal   Oral  Resp: Height:  (1.6 m)     Weight: 43.092 kg (95 lb)     SpO2: 100% 100% 100% 100%    Wt Readings from Last 3 Encounters:  07/22/15 43.092 kg (95 lb)  07/07/15 52.617 kg (116 lb)  06/20/15 67.7 kg (149 lb 4 oz)    General: Appears calm and frail, no apparent distress Eyes:  PERRL, EOMI, normal lids, iris ENT: very hard of  hearing, lips & tongue normal Neck: no lymphadenopathy, masses or  thyromegaly Cardiovascular: irregularly regular rate and rhythm, no murmurs, rubs or gallops. bilateral bandaged lower extremities, unable to assess ulcers or edema Respiratory: clear to auscultation bilaterally, no wheezing, rhonchi or rales. Normal respiratory effort. Abdomen: soft,non-tender, normal bowel sounds Skin: multiple keratotic areas, bilateral  bandaged  extremities, odorous. Unable to assess open lesion Musculoskeletal:  Marked kyphosis, muscle wasting, weak. Psychiatric: unable to assess, patient is mildly somnolent Neurologic: limited exam due to current mental status and marked kyphosis, patient unable to cooperate          Labs on Admission:  Basic Metabolic Panel:  Recent Labs Lab 07/22/15 1328  NA 143  K 5.3*  CL 107  CO2 19*  GLUCOSE 69  BUN 106*  CREATININE 4.26*  CALCIUM 8.9    Liver Function Tests:  Recent Labs Lab 07/22/15 1328  AST 51*  ALT 27  ALKPHOS 273*  BILITOT 1.1  PROT 6.8  ALBUMIN 2.4*   No results for input(s): LIPASE, AMYLASE in the last 168 hours. No results for input(s): AMMONIA in the last 168 hours.  CBC:  Recent Labs Lab 07/22/15 1328  WBC 20.6*  NEUTROABS 18.0*  HGB 9.9*  HCT 29.8*  MCV 89.0  PLT 225    Radiological Exams on Admission: No results found.  EKG: Independently reviewed.    Assessment/Plan Principal Problem:   Sepsis (HCC) Active Problems:   Atrial fibrillation (HCC)   Thyroid disease   Edema   Chronic combined systolic and diastolic heart failure (HCC)   Pressure ulcer   Hypotension   Acute renal failure (ARF) (HCC)   Wound infection (HCC)   DNR (do not resuscitate)   Weakness generalized  Sepsis, likely related to LLE cellulitis, recurrent  Patient meets sepsis criteria based on vital sign changes BP 92/50 mmHg  Pulse 108  Temp(Src) 97.5 F (36.4 C) (Oral)  Resp 10  Ht 5\' 3"  (1.6 m)  Wt 43.092 kg (95 lb)  BMI 16.83 kg/m2  SpO2 100% in 2 l WBC 20.6. Lactic acid 2.57     Admit to  stepdown Sepsis order set  IV antibiotics by pharmacy with Zosyn and Vanco Follow lactic acid  ABG Follow blood and urine cultures IV fluids at 100 cc/h.  Procalcitonin Code SEPSIS called to E-link   Acute encephalopathy. Etiology unclear.likely due to above, now slowly improving after Ceftin IV and IVF, Hypothermic.  Not hypoxic   Await urine studies and blood cultures  Continue supportive therapy   Lower extremity cellulitis chronic wound infection, s/p L pop and tibial revascularization Dr. Loreta Ave 05/09/15 IV antibiotics after blood cultures obtained Wound Care consult, last bandage change today  Acute kidney injury likely due to infection, sepsis, antibiotics current Cr 4.26  -  UA -  Strict I/O and daily weights -  Renally dose medications.  -  Minimize nephrotoxins   Anemia of chronic disease, infection, Hb 9.9 No acute bleeding issues at this time, to monitor as INR is currently at 9.49   Atrial Fibrillation EKG Atrial fibrillation RBBB and LAFB Probable lateral infarct, age indeterminate QTC 500 . Tachycardic at 105 . Tn 0.3, neg.  Continue Toprol, may need Cardizem if rate does not improve, currently 105  Coumadin due to highly elevated INR, currently at 9.49, no bleeding issues  Chronic Systolic and diastolic HF 2D Echo 06/12/15 Systolic function was moderately reduced.EF 35% to 40%. Diffuse hypokinesis.unknown diastolic function Hold diuresis due to sepsis Continue meds as above   Hypothyroidism: Chronic. Last TSH 3.597  -Continue home Synthroid  Code Status:  DNR . This is the third admission for the same issues of sepsis and cellulitis over the last 2 months. May need palliative care evaluation for role of Hospice in this patient  DVT Prophylaxis: Coumadin on hold due to elevated  INR. Will resume med when INR therapeutic    Family Communication: no one  at bedside Disposition Plan: Pending Improvement. Admitted for step down  Expected greater than 48  hrs    [facility] E,PA-C [facility] [email or web address] Password [email or web address]

## 2015-07-22 NOTE — ED Notes (Signed)
CRITICAL VALUE ALERT  Critical value received:  INR 9.24  Date of notification: 07/22/2015  Time of notification:  1640  Critical value read back: yes Nurse who received alert: Fredirick MaudlinHolley Trini Soldo RN  MD notified: Konrad DoloresMerrell MD

## 2015-07-22 NOTE — ED Notes (Signed)
[facility] for [facility] and "not feeling well" x3 days.  Pt has open wounds to bilateral lower legs that have been dressed by wound center.  Pt has been on abx x2 weeks for wounds.  Facility reports pt "just hasn't been acting like herself" and c/o generalized sickness x3 days.

## 2015-07-22 NOTE — ED Provider Notes (Signed)
CSN: [id number]     Arrival date & time 07/22/15  1252 History   First MD Initiated Contact with Patient 07/22/15 1304     Chief Complaint  Patient presents with  . Altered Mental Status     (Consider location/radiation/quality/duration/timing/severity/associated sxs/prior Treatment) HPI A sent from nursing home facility with report of decreased activity from patient's baseline and increased fatigue. No report of fever. Patient reports that she feels all right. She denies any nausea or vomiting. She reports that she has been able to eat all right. She denies chest pain, shortness of breath, cough or abdominal pain. She reports the main problem is her legs which have been swelling and having open wounds for several weeks. She reports that she goes to the wound care clinic and regularly has the dressings changed. She reports they were just changed today. She reports her feet continued to have drainage, redness and swelling. Past Medical History  Diagnosis Date  . PAD (peripheral artery disease) (HCC) 2013    left leg. sounds like she had angio-plasty   . Atrial fibrillation (HCC) 2013    s/p [facility] that failed. ON medication  . Hypertension   . Osteoporosis   . DVT (deep venous thrombosis) (HCC) 2013    "LLE" (08/04/2012)  . Hypothyroidism   . History of blood transfusion 2013    "related to thrombectomy" (08/04/2012)  . Anemia     "slightly" (08/04/2012)  . Arthritis     "probably minor; knees, pinky" (08/04/2012)  . Fracture of L4 vertebra (HCC)     "dx'd in 2013; can't treat til legs fixed" (08/04/2012)  . Lower extremity ulceration (HCC)   . Biventricular heart failure with reduced left ventricular function [facility])    Past Surgical History  Procedure Laterality Date  . Thrombectomy Left 2013    "leg" (08/04/2012)  . G2p2    . Tonsillectomy and adenoidectomy  ~ 1938  . Dilation and curettage of uterus  1960's   Family History  Problem Relation Age of Onset  . Arthritis Mother   .  Cancer Mother     breast cancer   . Cancer Brother     lung cancer  . Tuberculosis Father   . Cancer Sister     stg 4 breast cancer  . Diabetes Neg Hx   . Heart disease Neg Hx   . COPD Neg Hx    Social History  Substance Use Topics  . Smoking status: Never Smoker   . Smokeless tobacco: Never Used  . Alcohol Use: 0.0 oz/week     Comment: 08/04/2012 "used to have 1-2 glasses of wine/wk; nothing for the past 1 yr"   OB History    No data available     Review of Systems 10 Systems reviewed and are negative for acute change except as noted in the HPI.    Allergies  Bactrim ds; Contrast media; Ciprofloxacin; and Tape  Home Medications   Prior to Admission medications   Medication Sig Start Date End Date Taking? Authorizing Provider  Amino Acids-Protein Hydrolys (FEEDING SUPPLEMENT, PRO-STAT SUGAR FREE 64,) LIQD Take 30 mLs by mouth 2 (two) times daily.   Yes Historical Provider, MD  aspirin EC 81 MG EC tablet Take 1 tablet (81 mg total) by mouth daily. 05/13/15  Yes Maryann Mikhail, DO  docusate sodium (COLACE) 100 MG capsule Take 100 mg by mouth 2 (two) times daily.   Yes Historical Provider, MD  doxycycline (VIBRA-TABS) 100 MG tablet Take 1 tablet (100  mg total) by mouth every 12 (twelve) hours. 07/07/15  Yes Albertine GratesFang Xu, MD  feeding supplement, ENSURE ENLIVE, (ENSURE ENLIVE) LIQD Take 237 mLs by mouth 2 (two) times daily between meals. 06/20/15  Yes Meredeth IdeGagan S Lama, MD  levothyroxine (SYNTHROID, LEVOTHROID) 25 MCG tablet TAKE 1 AND 1/2 TABS (37.5MG ) BY MOUTH EVERY DAY*CHECK PULSE WEEKLY* 07/14/12  Yes Jacques NavyMichael E Norins, MD  metoprolol succinate (TOPROL-XL) 25 MG 24 hr tablet Take 0.5 tablets (12.5 mg total) by mouth daily. 11/01/12  Yes Jacques NavyMichael E Norins, MD  Multiple Vitamin (MULTIVITAMIN WITH MINERALS) TABS Take 1 tablet by mouth every morning.   Yes Historical Provider, MD  oxyCODONE (OXY IR/ROXICODONE) 5 MG immediate release tablet Take 5 mg by mouth every 6 (six) hours as needed for  severe pain.   Yes Historical Provider, MD  oxyCODONE (OXYCONTIN) 10 mg 12 hr tablet Take 10 mg by mouth every 12 (twelve) hours.   Yes Historical Provider, MD  torsemide (DEMADEX) 20 MG tablet Take 1 tablet (20 mg total) by mouth daily. Reported on 06/10/2015 07/09/15  Yes Albertine GratesFang Xu, MD  warfarin (COUMADIN) 2 MG tablet Take 2 mg by mouth daily.   Yes Historical Provider, MD  Amino Acids-Protein Hydrolys (FEEDING SUPPLEMENT, PRO-STAT SUGAR FREE 64,) LIQD Take 30 mLs by mouth 2 (two) times daily.    Historical Provider, MD  collagenase (SANTYL) ointment Apply topically daily. Patient not taking: Reported on 07/22/2015 07/07/15   Albertine GratesFang Xu, MD  docusate sodium (COLACE) 100 MG capsule Take 100 mg by mouth daily.    Historical Provider, MD  oxyCODONE (OXY IR/ROXICODONE) 5 MG immediate release tablet Take 1 tablet (5 mg total) by mouth every 6 (six) hours as needed for moderate pain or severe pain. Patient not taking: Reported on 07/22/2015 07/07/15   Albertine GratesFang Xu, MD  warfarin (COUMADIN) 2 MG tablet Take 1 tablet (2 mg total) by mouth daily at 6 PM. Patient not taking: Reported on 07/22/2015 07/07/15   Albertine GratesFang Xu, MD   BP 101/55 mmHg  Pulse 102  Temp(Src) 96.2 F (35.7 C) (Rectal)  Resp 18  Ht 5\' 3"  (1.6 m)  Wt 95 lb (43.092 kg)  BMI 16.83 kg/m2  SpO2 100% Physical Exam  Constitutional:  Patient is alert, she has no respiratory distress. Due to her body habitus she maintains a significantly forward flexed position of the upper body. This is baseline.  HENT:  Head: Normocephalic and atraumatic.  Nose: Nose normal.  Mouth/Throat: Oropharynx is clear and moist.  Cardiovascular:  Tachycardia irregularly irregular.  Pulmonary/Chest: Effort normal and breath sounds normal. No respiratory distress.  Patient has severe kyphosis. She however has good breath sounds bilaterally without wheeze rhonchi or rail. No respiratory distress.  Abdominal: Soft. Bowel sounds are normal. She exhibits no distension. There is no  tenderness. There is no rebound and no guarding.  Musculoskeletal:  Bilateral lower extremities have dressings with Coban wrap from below the knee to the toes. The exposed toes on the left are edematous and erythematous.  There is a small amount of ulcerated skin visible on the dorsum of the left foot. Right foot is slightly better in terms of erythema and swelling of the dressings have moist serous drainage.  Neurological: She is alert.  Patient is oriented and interactive verbally. At this time she does not show signs of confusion. She can move bilateral upper extremities and perform grip strength. Focal neurologic deficit.  Skin: Skin is warm and dry.  Psychiatric: She has a normal mood  and affect.    ED Course  Procedures (including critical care time) CRITICAL CARE Performed by: Arby Barrette   Total critical care time: 45 minutes  Critical care time was exclusive of separately billable procedures and treating other patients.  Critical care was necessary to treat or prevent imminent or life-threatening deterioration.  Critical care was time spent personally by me on the following activities: development of treatment plan with patient and/or surrogate as well as nursing, discussions with consultants, evaluation of patient's response to treatment, examination of patient, obtaining history from patient or surrogate, ordering and performing treatments and interventions, ordering and review of laboratory studies, ordering and review of radiographic studies, pulse oximetry and re-evaluation of patient's condition. Labs Review Labs Reviewed  COMPREHENSIVE METABOLIC PANEL - Abnormal; Notable for the following:    Potassium 5.3 (*)    CO2 19 (*)    BUN 106 (*)    Creatinine, Ser 4.26 (*)    Albumin 2.4 (*)    AST 51 (*)    Alkaline Phosphatase 273 (*)    GFR calc non Af Amer 9 (*)    GFR calc Af Amer 10 (*)    Anion gap 17 (*)    All other components within normal limits  CBC WITH  DIFFERENTIAL/PLATELET - Abnormal; Notable for the following:    WBC 20.6 (*)    RBC 3.35 (*)    Hemoglobin 9.9 (*)    HCT 29.8 (*)    RDW 19.3 (*)    Neutro Abs 18.0 (*)    Monocytes Absolute 1.6 (*)    All other components within normal limits  I-STAT CG4 LACTIC ACID, ED - Abnormal; Notable for the following:    Lactic Acid, Venous 2.57 (*)    All other components within normal limits  URINE CULTURE  CULTURE, BLOOD (ROUTINE X 2)  CULTURE, BLOOD (ROUTINE X 2)  URINALYSIS, ROUTINE W REFLEX MICROSCOPIC (NOT AT [facility])  I-STAT CG4 LACTIC ACID, ED    Imaging Review No results found. I have personally reviewed and evaluated these images and lab results as part of my medical decision-making.   EKG Interpretation   Date/Time:  Monday July 22 2015 12:55:46 EDT Ventricular Rate:  110 PR Interval:    QRS Duration: 125 QT Interval:  370 QTC Calculation: 500 R Axis:   -103 Text Interpretation:  Atrial fibrillation RBBB and LAFB Probable lateral  infarct, age indeterminate Confirmed by Donnald Garre, MD, Lebron Conners [phone]) on  07/22/2015 3:12:07 PM      MDM   Final diagnoses:  Cellulitis of lower extremity, unspecified laterality  Sepsis, due to unspecified organism (HCC)  Acute renal failure, unspecified acute renal failure type (HCC)   A sent in with report of mental status change. At this time she is alert and interactive. There is no ancillary historian here for additional history. Patient predominantly notes her lower extremities as being the main problem in terms of her health. These are getting routine wound care but clearly continued to exhibit cellulitis and open wounds. Patient has significant leukocytosis. Elevated lactate. She has chronic atrial fibrillation and her rate is somewhat elevated. Patient also has acute renal failure. Sepsis treatment will be initiated. Plan will be for admission.    Arby Barrette, MD 07/22/15 [phone]

## 2015-07-23 ENCOUNTER — Inpatient Hospital Stay (HOSPITAL_COMMUNITY): Payer: Medicare Other

## 2015-07-23 DIAGNOSIS — L0390 Cellulitis, unspecified: Secondary | ICD-10-CM | POA: Insufficient documentation

## 2015-07-23 DIAGNOSIS — I9589 Other hypotension: Secondary | ICD-10-CM

## 2015-07-23 LAB — URINALYSIS, ROUTINE W REFLEX MICROSCOPIC
BILIRUBIN URINE: NEGATIVE
GLUCOSE, UA: NEGATIVE mg/dL
HGB URINE DIPSTICK: NEGATIVE
KETONES UR: NEGATIVE mg/dL
Nitrite: NEGATIVE
PROTEIN: NEGATIVE mg/dL
Specific Gravity, Urine: 1.013 (ref 1.005–1.030)
pH: 5 (ref 5.0–8.0)

## 2015-07-23 LAB — COMPREHENSIVE METABOLIC PANEL
ALK PHOS: 256 U/L — AB (ref 38–126)
ALT: 24 U/L (ref 14–54)
ANION GAP: 13 (ref 5–15)
AST: 47 U/L — ABNORMAL HIGH (ref 15–41)
Albumin: 2 g/dL — ABNORMAL LOW (ref 3.5–5.0)
BILIRUBIN TOTAL: 0.8 mg/dL (ref 0.3–1.2)
BUN: 102 mg/dL — ABNORMAL HIGH (ref 6–20)
CALCIUM: 8.5 mg/dL — AB (ref 8.9–10.3)
CO2: 18 mmol/L — ABNORMAL LOW (ref 22–32)
CREATININE: 3.86 mg/dL — AB (ref 0.44–1.00)
Chloride: 112 mmol/L — ABNORMAL HIGH (ref 101–111)
GFR, EST AFRICAN AMERICAN: 11 mL/min — AB (ref >60)
GFR, EST NON AFRICAN AMERICAN: 10 mL/min — AB (ref >60)
Glucose, Bld: 73 mg/dL (ref 65–99)
Potassium: 5 mmol/L (ref 3.5–5.1)
SODIUM: 143 mmol/L (ref 135–145)
TOTAL PROTEIN: 5.9 g/dL — AB (ref 6.5–8.1)

## 2015-07-23 LAB — CBC
HCT: 28.4 % — ABNORMAL LOW (ref 36.0–46.0)
HEMOGLOBIN: 9.1 g/dL — AB (ref 12.0–15.0)
MCH: 28.3 pg (ref 26.0–34.0)
MCHC: 32 g/dL (ref 30.0–36.0)
MCV: 88.2 fL (ref 78.0–100.0)
PLATELETS: 168 10*3/uL (ref 150–400)
RBC: 3.22 MIL/uL — AB (ref 3.87–5.11)
RDW: 19.1 % — ABNORMAL HIGH (ref 11.5–15.5)
WBC: 15.1 10*3/uL — AB (ref 4.0–10.5)

## 2015-07-23 LAB — URINE MICROSCOPIC-ADD ON

## 2015-07-23 LAB — PROTIME-INR
INR: >10 (ref 0.00–1.49)
INR: 3.64 — ABNORMAL HIGH (ref 0.00–1.49)
PROTHROMBIN TIME: 35.4 s — AB (ref 11.6–15.2)
Prothrombin Time: >90 seconds — ABNORMAL HIGH (ref 11.6–15.2)

## 2015-07-23 LAB — CORTISOL-AM, BLOOD: Cortisol - AM: 26.6 ug/dL — ABNORMAL HIGH (ref 6.7–22.6)

## 2015-07-23 LAB — MRSA PCR SCREENING: MRSA BY PCR: NEGATIVE

## 2015-07-23 MED ORDER — SODIUM CHLORIDE 0.9 % IV SOLN
INTRAVENOUS | Status: DC
  Administered 2015-07-23 (×2): via INTRAVENOUS

## 2015-07-23 MED ORDER — VITAMIN K1 10 MG/ML IJ SOLN
5.0000 mg | Freq: Once | INTRAVENOUS | Status: AC
  Administered 2015-07-23: 5 mg via INTRAVENOUS
  Filled 2015-07-23: qty 0.5

## 2015-07-23 MED ORDER — COLLAGENASE 250 UNIT/GM EX OINT
TOPICAL_OINTMENT | Freq: Every day | CUTANEOUS | Status: DC
  Administered 2015-07-24: 11:00:00 via TOPICAL
  Filled 2015-07-23: qty 30

## 2015-07-23 MED ORDER — SODIUM CHLORIDE 0.9 % IV BOLUS (SEPSIS): 500.0000 mL | Freq: Once | INTRAVENOUS | Status: DC

## 2015-07-23 MED ORDER — SODIUM CHLORIDE 0.9 % IV BOLUS (SEPSIS)
500.0000 mL | Freq: Once | INTRAVENOUS | Status: AC
  Administered 2015-07-23: 500 mL via INTRAVENOUS

## 2015-07-23 MED ORDER — HYDROCORTISONE NA SUCCINATE PF 100 MG IJ SOLR
100.0000 mg | Freq: Four times a day (QID) | INTRAMUSCULAR | Status: DC
  Administered 2015-07-23 – 2015-07-24 (×5): 100 mg via INTRAVENOUS
  Filled 2015-07-23 (×5): qty 2

## 2015-07-23 NOTE — Progress Notes (Signed)
Utilization review completed.  L. J. Oak Dorey RN, BSN, CM 

## 2015-07-23 NOTE — ED Notes (Signed)
Pt refusing ultrasound.  Admitting MD paged to be made aware of pt refusal and most recent vital signs.

## 2015-07-23 NOTE — ED Notes (Signed)
Pt placed on bedpan

## 2015-07-23 NOTE — ED Notes (Signed)
Wound Nurse at bedside.

## 2015-07-23 NOTE — Consult Note (Addendum)
WOC wound consult note Pt familiar to WOC from previous admission on 3/2; consult requested for bilat leg chronic wounds today. She has been previously followed as an outpatient by the wound care center, but is moaning and crying and unable to answer questions; when asked the date of her most recent appointment, she is unable to respond.She recently had a revascularization procedure to the left leg, but wounds have significantly declined when assessed today.  Removed bilat leg wraps, which were calcium alginate, ABD pads, and kerlex and coban. Legs had very strong foul odor and drainage had saturated through the layers before wraps were removed.  Wound type: Full thickness stasis ulcers wounds to bilat legs; these are very painful. Pt medicated prior to procedure but still had a large amt pain with the dressing change. Measurement: It is difficult to measure separate wounds, since almost the entire left anterior and posterior calf is affected with full thickness tissue loss, all wounds with  large amt greenish drainage with strong foul odor.  Approximate posterior leg wound size is 14X10X.2cm, 90% yellow slough, 10% red Anterior leg covers 90% of the calf surface area and is 90% loose yellow slough,10%   red, 22X8X.2cm Left upper inner thigh wound 11X3cm; 100% loose yellow slough  Left foot 1.5X1.5X.2cm, 100% yellow slough, mod amt yellow drainage, no odor.  Right anterior leg with partial thickness wounds; all 90% yellow, 10% red; 15X5cm, large amt strong odor and green drainage. Right foot 8X5cm; 90% yellow, 10%red Several patchy areas of small full thickness wounds surrounding scattered outer areas of bilat legs, red dry wound beds. Dressing procedure/placement/frequency: Bilat leg wounds are extensive and have strong foul odor and large amt drainage, they have declined significantly in the past month. Pt is high risk for osteomyelitis; consider MRI and please consult ortho team for further plan of  care.  Pt could benefit from palliative care consult to determine goals of care.  No family members at bedside to discuss plan of care.  One hour spent performing this consult. Santyl for chemical debridement and nonadherent dressings to decrease pain with dressing changes.She can resume follow-up at the outpatient wound care center after discharge if desired. Please re-consult if further assistance is needed. Mardee Postinhank-you,  Billie Trager MSN, RN, CWOCN, [place]AP, CNS [phone],

## 2015-07-23 NOTE — Progress Notes (Signed)
Pt arrived on unit at 1840. Given CHG bath and MRSA swab collected. Pt resting without distress. Will continue to monitor.

## 2015-07-23 NOTE — ED Notes (Signed)
Sent down to pharmacy for zosyn dose

## 2015-07-23 NOTE — Progress Notes (Signed)
[facility] PROGRESS NOTE  Kristina Diaz CXK:[id number] DOB: [birth year] DOA: 07/22/2015  PCP: Nyoka Cowden, MD  Brief HPI: 82 year old Caucasian female who was recently hospitalized for cellulitis of her left lower extremity. She has chronic bilateral lower extremity wounds. This is her third hospitalization for similar reasons. She had an MRI done of the left leg on March 7 and no drainable abscess or osteomyelitis was noted at that time. Patient was also seen. The palliative medicine. During that hospitalization. She comes back in with worsening pain in the left leg and sepsis picture.  Past medical history:  Past Medical History  Diagnosis Date  . PAD (peripheral artery disease) ([place]) 2013    left leg. sounds like she had angio-plasty   . Atrial fibrillation ([place]) 2013    s/p [facility] that failed. ON medication  . Hypertension   . Osteoporosis   . DVT (deep venous thrombosis) ([place]) 2013    "LLE" (08/04/2012)  . Hypothyroidism   . History of blood transfusion 2013    "related to thrombectomy" (08/04/2012)  . Anemia     "slightly" (08/04/2012)  . Arthritis     "probably minor; knees, pinky" (08/04/2012)  . Fracture of L4 vertebra ([place])     "dx'd in 2013; can't treat til legs fixed" (08/04/2012)  . Lower extremity ulceration ([place])   . Biventricular heart failure with reduced left ventricular function ([place])     Consultants: Palliative medicine  Procedures: None  Antibiotics: Vancomycin and Zosyn  Subjective: Patient feels quite weak and fatigued. She did not want me to examine her as she was worried that it would cause more pain. She states that she is tired and frustrated. Denies any chest pain. No dizziness or lightheadedness. She insists that I speak to her son.  Objective: Vital Signs  Filed Vitals:   07/23/15 1100 07/23/15 1130 07/23/15 1200 07/23/15 1201  BP: 86/37 85/42 90/44  90/44  Pulse: 107 70 114 109  Temp:      TempSrc:      Resp: 12 19 18 13     Height:      Weight:      SpO2: 100% 100% 100% 100%    Intake/Output Summary (Last 24 hours) at 07/23/15 1223 Last data filed at 07/22/15 1658  Gross per 24 hour  Intake   1500 ml  Output      0 ml  Net   1500 ml   Filed Weights   07/22/15 1254  Weight: 43.092 kg (95 lb)    General appearance: alert, appears stated age, distracted, no distress and In moderate discomfort Resp: Diminished air entry at the bases. No definite crackles, wheezing or rhonchi. Cardio: regular rate and rhythm, S1, S2 normal, no murmur, click, rub or gallop GI: soft, non-tender; bowel sounds normal; no masses,  no organomegaly Extremities: Both legs are covered in dressing. Unclear when this dressing was replaced. Some erythema noted around the dressing t. Neurologic: Awake, alert. Distracted. Appears to be in some discomfort and pain. No obvious focal neurological deficits.  Lab Results:  Basic Metabolic Panel:  Recent Labs Lab 07/22/15 1328 07/22/15 1928 07/23/15 0333  NA 143 144 143  K 5.3* 3.6 5.0  CL 107 119* 112*  CO2 19* 15* 18*  GLUCOSE 69 60* 73  BUN 106* 80* 102*  CREATININE 4.26* 3.01* 3.86*  CALCIUM 8.9 6.2* 8.5*   Liver Function Tests:  Recent Labs Lab 07/22/15 1328 07/22/15 1928 07/23/15 0333  AST 51* 35 47*  ALT 27  19 24  ALKPHOS 273* 184* 256*  BILITOT 1.1 1.0 0.8  PROT 6.8 4.4* 5.9*  ALBUMIN 2.4* 1.5* 2.0*   CBC:  Recent Labs Lab 07/22/15 1328 07/22/15 1928 07/23/15 0333  WBC 20.6* 12.4* 15.1*  NEUTROABS 18.0* 10.6*  --   HGB 9.9* 7.6* 9.1*  HCT 29.8* 23.0* 28.4*  MCV 89.0 88.1 88.2  PLT 225 157 168     Recent Results (from the past 240 hour(s))  Blood Culture (routine x 2)     Status: None (Preliminary result)   Collection Time: 07/22/15  3:40 PM  Result Value Ref Range Status   Specimen Description BLOOD LEFT ARM  Final   Special Requests IN PEDIATRIC BOTTLE 1CC  Final   Culture NO GROWTH < 24 HOURS  Final   Report Status PENDING  Incomplete   Blood Culture (routine x 2)     Status: None (Preliminary result)   Collection Time: 07/22/15  4:15 PM  Result Value Ref Range Status   Specimen Description BLOOD LEFT HAND  Final   Special Requests IN PEDIATRIC BOTTLE 1CC  Final   Culture NO GROWTH < 24 HOURS  Final   Report Status PENDING  Incomplete      Studies/Results: No results found.  Medications:  Scheduled: . collagenase   Topical Daily  . hydrocortisone sod succinate (SOLU-CORTEF) inj  100 mg Intravenous Q6H  . levothyroxine  37.5 mcg Oral QAC breakfast   Continuous: . sodium chloride 100 mL/hr at 07/23/15 0836  . piperacillin-tazobactam (ZOSYN)  IV Stopped (07/23/15 1026)  . sodium chloride     GYB:[id number] **OR** acetaminophen, HYDROcodone-acetaminophen, magnesium citrate, morphine injection, ondansetron **OR** ondansetron (ZOFRAN) IV, polyethylene glycol, traZODone  Assessment/Plan:  Principal Problem:   Sepsis ([place]) Active Problems:   Atrial fibrillation (HCC)   Thyroid disease   Edema   Chronic combined systolic and diastolic heart failure (HCC)   Pressure ulcer   Hypotension   Acute renal failure (ARF) ([place])   Wound infection ([place])   DNR (do not resuscitate)   Weakness generalized    Sepsis, likely related to LLE cellulitis, recurrent with hypotension Patient met sepsis criteria based on vital sign changes including hypertension, tachycardia. She also had elevated lactic acid level. She was given IV fluids with improvement. Blood pressure remains low. Her previous hospitalization notes were reviewed. She did get stress dose steroids last time around. So, adrenal insufficiency is a possibility. She'll be given hydrocortisone intravenously for now. Follow-up on blood cultures.  Bilateral Lower extremity cellulitis chronic wound infection S/p L pop and tibial revascularization Dr. Earleen Newport 05/09/15. This is her third hospitalization for similar issue. Wound care consult has been placed. Apparently,  the wounds are looking worse than before. Continue vancomycin and Zosyn as mentioned above. Before we pursue aggressive treatment options, we will have the patient evaluated by palliative medicine. Discussed with the son and he agrees with this.  Acute encephalopathy Likely due to acute infection and sepsis. Mental status has improved.   Acute kidney injury Likely due to infection, sepsis. Patient has been given IV fluids and creatinine is improving. Monitor urine output. Renal ultrasound. Minimize neurotoxins.  Anemia of chronic disease No acute bleeding issues at this time even though her INR is elevated.  Supratherapeutic INR Patient is on warfarin for history of atrial fibrillation. INR this morning is greater than 10. She will be given dose of intravenous vitamin K. No overt bleeding at this time. Repeat INR later today.  Atrial Fibrillation Monitor heart  rate closely. Due to low blood pressures hold off on blood pressure lowering agents.  Chronic Systolic and diastolic HF 2D Echo 0/30/13 Systolic function was moderately reduced.EF 35% to 40%. Diffuse hypokinesis. Hold diuresis due to sepsis and hypotension.  Hypothyroidism Continue home Synthroid  DVT Prophylaxis: Currently with supratherapeutic INR    Code Status: DO NOT RESUSCITATE  Family Communication: [place]. The patient's son in detail. He agrees with pursuing palliative care at this time. He has noted that his mother has been declining for the last 2-3 months.  Disposition Plan: Patient is in the emergency department still waiting on a stepdown bed. We will wait for palliative medicine evaluation and discussion with the family. Her overall long-term prognosis appears to be poor. It may not be unreasonable to pursue hospice at this time. Patient also appears to be quite tired and exhausted.    LOS: 1 day   [facility] Pager [phone] 07/23/2015, 12:23 PM  If 7PM-7AM, please contact night-coverage  at [email or web address], password [facility]

## 2015-07-23 NOTE — ED Notes (Signed)
Spoke with palliative care rounding team who reports they will do their best to move pt to next on the list to be seen for possible downgrade.

## 2015-07-23 NOTE — ED Notes (Signed)
RN contacted pt son, per pt request, son updated and is on the way to see pt.

## 2015-07-24 DIAGNOSIS — L03119 Cellulitis of unspecified part of limb: Secondary | ICD-10-CM

## 2015-07-24 DIAGNOSIS — I482 Chronic atrial fibrillation: Secondary | ICD-10-CM

## 2015-07-24 DIAGNOSIS — Z66 Do not resuscitate: Secondary | ICD-10-CM

## 2015-07-24 DIAGNOSIS — A419 Sepsis, unspecified organism: Principal | ICD-10-CM

## 2015-07-24 DIAGNOSIS — Z7189 Other specified counseling: Secondary | ICD-10-CM

## 2015-07-24 DIAGNOSIS — N179 Acute kidney failure, unspecified: Secondary | ICD-10-CM

## 2015-07-24 LAB — CBC
HEMATOCRIT: 27.1 % — AB (ref 36.0–46.0)
HEMOGLOBIN: 8.9 g/dL — AB (ref 12.0–15.0)
MCH: 28.7 pg (ref 26.0–34.0)
MCHC: 32.8 g/dL (ref 30.0–36.0)
MCV: 87.4 fL (ref 78.0–100.0)
Platelets: 151 10*3/uL (ref 150–400)
RBC: 3.1 MIL/uL — AB (ref 3.87–5.11)
RDW: 19 % — ABNORMAL HIGH (ref 11.5–15.5)
WBC: 9.3 10*3/uL (ref 4.0–10.5)

## 2015-07-24 LAB — COMPREHENSIVE METABOLIC PANEL
ALBUMIN: 1.8 g/dL — AB (ref 3.5–5.0)
ALK PHOS: 217 U/L — AB (ref 38–126)
ALT: 25 U/L (ref 14–54)
AST: 43 U/L — AB (ref 15–41)
Anion gap: 11 (ref 5–15)
BILIRUBIN TOTAL: 1.3 mg/dL — AB (ref 0.3–1.2)
BUN: 98 mg/dL — AB (ref 6–20)
CO2: 19 mmol/L — ABNORMAL LOW (ref 22–32)
Calcium: 8.3 mg/dL — ABNORMAL LOW (ref 8.9–10.3)
Chloride: 116 mmol/L — ABNORMAL HIGH (ref 101–111)
Creatinine, Ser: 3.2 mg/dL — ABNORMAL HIGH (ref 0.44–1.00)
GFR calc Af Amer: 14 mL/min — ABNORMAL LOW (ref >60)
GFR calc non Af Amer: 12 mL/min — ABNORMAL LOW (ref >60)
GLUCOSE: 140 mg/dL — AB (ref 65–99)
POTASSIUM: 4 mmol/L (ref 3.5–5.1)
Sodium: 146 mmol/L — ABNORMAL HIGH (ref 135–145)
TOTAL PROTEIN: 5.4 g/dL — AB (ref 6.5–8.1)

## 2015-07-24 LAB — URINE CULTURE

## 2015-07-24 LAB — PROTIME-INR
INR: 2.35 — ABNORMAL HIGH (ref 0.00–1.49)
Prothrombin Time: 25.5 seconds — ABNORMAL HIGH (ref 11.6–15.2)

## 2015-07-24 LAB — PROCALCITONIN: PROCALCITONIN: 1.38 ng/mL

## 2015-07-24 MED ORDER — OXYCODONE HCL ER 10 MG PO T12A
10.0000 mg | EXTENDED_RELEASE_TABLET | Freq: Two times a day (BID) | ORAL | Status: DC
  Administered 2015-07-24 – 2015-07-26 (×5): 10 mg via ORAL
  Filled 2015-07-24 (×5): qty 1

## 2015-07-24 MED ORDER — MORPHINE SULFATE (PF) 2 MG/ML IV SOLN: 1.0000 mg | INTRAVENOUS | Status: DC | PRN

## 2015-07-24 MED ORDER — NYSTATIN 100000 UNIT/ML MT SUSP
5.0000 mL | Freq: Four times a day (QID) | OROMUCOSAL | Status: DC
  Administered 2015-07-24 – 2015-07-26 (×7): 500000 [IU] via ORAL
  Filled 2015-07-24 (×8): qty 5

## 2015-07-24 MED ORDER — SODIUM CHLORIDE 0.9 % IV SOLN
INTRAVENOUS | Status: DC
  Administered 2015-07-24: 17:00:00 via INTRAVENOUS

## 2015-07-24 NOTE — Progress Notes (Signed)
[place] TEAM 1 - Stepdown/ICU TEAM PROGRESS NOTE  Kristina Diaz ZOX:[id number] DOB: 31-Aug-1929 DOA: 07/22/2015 PCP: [name], MD  Admit HPI / Brief Narrative: 82 year old female who was recently hospitalized for cellulitis of her left lower extremity. She has chronic bilateral lower extremity wounds. This is her third hospitalization for similar reasons. She had an MRI done of the left leg on March 7 and no drainable abscess or osteomyelitis was noted at that time. She was re-admitted with worsening pain in the left leg and sepsis.  HPI/Subjective: The patient is alert and conversant.  She is eating tomato soup.  She states she is having very poorly controlled pain in her legs.  She denies chest pain shortness of breath or abdominal pain.  Assessment/Plan:  Septic shock due to LLE cellulitis The pt has decided to transition to comfort focused care at this time - the goal is to obtain a bed at [facility]   Bilateral Lower extremity cellulitis / chronic wound infections S/p L pop and tibial revascularization Dr. Loreta Ave 05/09/15 - this is her third hospitalization for similar issue - these wounds are clearly only getting worse, and no longer responding to medical tx   Acute encephalopathy due to acute infection and sepsis - mental status has improved  Acute kidney injury due to sepsis/shock/poor organ perfusion   Anemia of chronic disease No acute bleeding issues at this time   Supratherapeutic INR on warfarin for history of atrial fibrillation - no overt bleeding at this time - d/c warfarin w/ transition to comfort focused care   Atrial Fibrillation HR presently reasonably controlled   Chronic Systolic and diastolic HF 2D Echo 06/12/15 > EF 35% to 40% w/ diffuse hypokinesis.  Hypothyroidism  Code Status: DNR - NO CODE  Family Communication: no family present at time of exam Disposition Plan: comfort care only - pursuing bed at [facility]    Consultants: Palliative Care  Procedures: none  Antibiotics: Comfort focused care only   DVT prophylaxis: Comfort focused care only   Objective: Blood pressure 95/59, pulse 102, temperature 97.8 F (36.6 C), temperature source Oral, resp. rate 12, height  (1.6 m), weight 55.747 kg (122 lb 14.4 oz), SpO2 100 %.  Intake/Output Summary (Last 24 hours) at 07/24/15 1407 Last data filed at 07/24/15 1100  Gross per 24 hour  Intake   2180 ml  Output    250 ml  Net   1930 ml    Exam: General: No acute respiratory distress - alert  Lungs: Clear to auscultation bilaterally  Cardiovascular: Regular rate without murmur gallop or rub normal S1 and S2 Abdomen: Nontender, nondistended, soft, bowel sounds positive, no rebound, no ascites, no appreciable mass Extremities: No significant cyanosis, or clubbing;  weeping from B LE wound dressings   Data Reviewed:  Basic Metabolic Panel:  Recent Labs Lab 07/22/15 1328 07/22/15 1928 07/23/15 0333 07/24/15 0530  NA 143 144 143 146*  K 5.3* 3.6 5.0 4.0  CL 107 119* 112* 116*  CO2 19* 15* 18* 19*  GLUCOSE 69 60* 73 140*  BUN 106* 80* 102* 98*  CREATININE 4.26* 3.01* 3.86* 3.20*  CALCIUM 8.9 6.2* 8.5* 8.3*    CBC:  Recent Labs Lab 07/22/15 1328 07/22/15 1928 07/23/15 0333 07/24/15 0530  WBC 20.6* 12.4* 15.1* 9.3  NEUTROABS 18.0* 10.6*  --   --   HGB 9.9* 7.6* 9.1* 8.9*  HCT 29.8* 23.0* 28.4* 27.1*  MCV 89.0 88.1 88.2 87.4  PLT 225 157 168  151    Liver Function Tests:  Recent Labs Lab 07/22/15 1328 07/22/15 1928 07/23/15 0333 07/24/15 0530  AST 51* 35 47* 43*  ALT 27 19 24 25   ALKPHOS 273* 184* 256* 217*  BILITOT 1.1 1.0 0.8 1.3*  PROT 6.8 4.4* 5.9* 5.4*  ALBUMIN 2.4* 1.5* 2.0* 1.8*   Coags:  Recent Labs Lab 07/22/15 1606 07/22/15 1928 07/23/15 0333 07/23/15 1629 07/24/15 0530  INR 9.49* >10.00* >10.00* 3.64* 2.35*    Recent Labs Lab 07/22/15 1928  APTT 75*    Recent Results (from the  past 240 hour(s))  Blood Culture (routine x 2)     Status: None (Preliminary result)   Collection Time: 07/22/15  3:40 PM  Result Value Ref Range Status   Specimen Description BLOOD LEFT ARM  Final   Special Requests IN PEDIATRIC BOTTLE 1CC  Final   Culture  Setup Time   Final    GRAM POSITIVE RODS AEROBIC BOTTLE ONLY CRITICAL RESULT CALLED TO, READ BACK BY AND VERIFIED WITH: C WOODARD,RN @0208  07/24/15 MKELLY    Culture   Final    GRAM POSITIVE RODS CULTURE REINCUBATED FOR BETTER GROWTH    Report Status PENDING  Incomplete  Blood Culture (routine x 2)     Status: None (Preliminary result)   Collection Time: 07/22/15  4:15 PM  Result Value Ref Range Status   Specimen Description BLOOD LEFT HAND  Final   Special Requests IN PEDIATRIC BOTTLE 1CC  Final   Culture NO GROWTH 2 DAYS  Final   Report Status PENDING  Incomplete  Urine culture     Status: None (Preliminary result)   Collection Time: 07/23/15  5:28 AM  Result Value Ref Range Status   Specimen Description URINE, CLEAN CATCH  Final   Special Requests NONE  Final   Culture CULTURE REINCUBATED FOR BETTER GROWTH  Final   Report Status PENDING  Incomplete  MRSA PCR Screening     Status: None   Collection Time: 07/23/15  6:41 PM  Result Value Ref Range Status   MRSA by PCR NEGATIVE NEGATIVE Final    Comment:        The GeneXpert MRSA Assay (FDA approved for NASAL specimens only), is one component of a comprehensive MRSA colonization surveillance program. It is not intended to diagnose MRSA infection nor to guide or monitor treatment for MRSA infections.      Studies:   Recent x-ray studies have been reviewed in detail by the Attending Physician  Scheduled Meds:  Scheduled Meds: . hydrocortisone sod succinate (SOLU-CORTEF) inj  100 mg Intravenous Q6H  . levothyroxine  37.5 mcg Oral QAC breakfast  . nystatin  5 mL Oral QID  . oxyCODONE  10 mg Oral Q12H  . piperacillin-tazobactam (ZOSYN)  IV  2.25 g Intravenous  Q8H  . sodium chloride  500 mL Intravenous Once    Time spent on care of this patient: 35 mins   MCCLUNG,JEFFREY T , MD   [facility] Office  [phone] Pager - Text Page per Loretha StaplerAmion as per below:  On-Call/Text Page:      [email or web address]      password [email or web address]  If 7PM-7AM, please contact night-coverage [email or web address] Password [email or web address] 07/24/2015, 2:07 PM   LOS: 2 days

## 2015-07-24 NOTE — Consult Note (Addendum)
WOC follow-up: Pt now with comfort care goals, according to the EMR.  Dressings will need to be changed daily to control odor and drainage.  Discontinue Santyl and apply Xeroform gauze to decrease adherence to wounds and assist with minimizing discomfort, and ABD pads and kerlex to absorb drainage.  Please refer to palliative care team for further plan of care. Please re-consult if further assistance is needed.  Thank-you,  Cammie Mcgeeawn Domanic Matusek MSN, RN, CWOCN, IndustryWCN-AP, CNS [phone]

## 2015-07-24 NOTE — Progress Notes (Addendum)
CSW consulted for [facility].  [facility] spoke [facility]:Angela, RN, and WaldoPriscilla, Interior and spatial designerdirector- they do not feel comfortable taking patient back with wound care needs- stated this has been hard to handle at ALF level and do not think that she is appropriate to return.  CSW discussed with palliative physician who does think pt would be appropriate for residential hospice once all medical interventions are stopped.  CSW spoke with pt son, [name], who is agreeable to [facility]  [facility]- awaiting response  Merlyn LotJenna Holoman, [facility]thritis HospitalCSWA Clinical Social Worker [phone]

## 2015-07-24 NOTE — Clinical Documentation Improvement (Signed)
Internal Medicine  Can the diagnosis of Hypotension be further specified? Please document response in next progress note. Thank you!   Shock, including Type:  Septic, Cardiogenic, Hypovolemic, Other Type  Shock ruled out  Hypotension was secondary to _________________.  Other  Clinically Undetermined  Document any associated diagnoses/conditions.  Supporting Information:  Serial Lactates initially run were: 2.57 and then 2.24  BP's running 83/43 with Map of 55, 81/43 Map of 55  HR > 90, RR running 13 to 30  Zosyn initiated; fluids given  Please exercise your independent, professional judgment when responding. A specific answer is not anticipated or expected.  Thank You,  Shellee MiloEileen T Simon Llamas RN, BSN, CCDS Health Information Management [place] [phone]; Cell: [phone]

## 2015-07-24 NOTE — Progress Notes (Signed)
Consultation Note Date: 07/24/2015   Patient Name: Kristina Diaz  DOB: Mar 15, 1930  MRN: [id number]  Age / Sex: 82 y.o., female  PCP: Marletta Lor, MD Referring Physician: Cherene Altes, MD  Reason for Consultation: Establishing goals of care  82 yo female with severe non healing bilateral lower extremity wounds, A fib, biventricular heart failure, and cachexia presented to [facility] 3/27 with recurrent sepsis due to wound infection.  This is her fifth admission in 6 months.  Osteomyelitis of her lower extremities is suspected.  The patient was encephalopathic when she was admitted.  This has resolved.  Her blood pressure is being supported with solu cortef as she was so hypotensive.  [place] MDs have been trying to strike a balance between supporting the patient's blood pressure and providing enough pain medication to relieve her symptoms.   Clinical Assessment/Narrative:  Met at bedside with son and patient.  Per son, patient has been less awake, and eating much less since Friday night.  Per her son - "I think we are closer to the end of the race", he mentions that he would like for her to pass at [facility] place.  We completed the MOST form.  The patient elects not to return to the hospital and to be kept comfortable.  She would like antibiotics only for comfort.  He son is supportive of these decisions.  Mrs. Huizenga tells me her pain is overwhelming every time she moves at all.    Outside of the room Catalina Antigua (Son) asks if hospice could provide counseling to both of his parents as they are having a hard time accepting his mother's end of life.   Contacts/Participants in Discussion:  Patient, Son, Palliative PA Primary Decision Maker: Patient.   SUMMARY OF RECOMMENDATIONS  Code Status/Advance Care Planning: DNR     Symptom Management:   Will restart long acting Oxycontin in addition to PRNs, and despite BP  Will  reassess later today.  Palliative Prophylaxis:   Aspiration, Bowel Regimen, Delirium Protocol, Frequent Pain Assessment and Palliative Wound Care  Additional Recommendations (Limitations, Scope, Preferences):  Full Comfort Care   Patient may be best served by a Hospice facility at this point.  Psycho-social/Spiritual:  Support System: Strong Desire for further Chaplaincy support:  Yes - Lutheran Additional Recommendations: Caregiving  Support/Resources and Grief/Bereavement Support  Prognosis: < 2 weeks - if solu cortef and IV antibiotics were stopped and the patient was allowed to have enough medication to make her comfortable her prognosis would be less than 2 weeks.    Discharge Planning: Hospice facility   Chief Complaint/ Primary Diagnoses: Present on Admission:  . Acute renal failure (ARF) ([place]) . Atrial fibrillation ([place]) . Thyroid disease . Edema . Chronic combined systolic and diastolic heart failure ([place]) . Sepsis ([place]) . Pressure ulcer . Hypotension . DNR (do not resuscitate)  I have reviewed the medical record, interviewed the patient and family, and examined the patient. The following aspects are pertinent.  Past Medical History  Diagnosis Date  . PAD (peripheral artery disease) ([place]) 2013    left leg. sounds like she had angio-plasty   . Atrial fibrillation ([place]) 2013    s/p [facility] that failed. ON medication  . Hypertension   . Osteoporosis   . DVT (deep venous thrombosis) ([place]) 2013    "LLE" (08/04/2012)  . Hypothyroidism   . History of blood transfusion 2013    "related to thrombectomy" (08/04/2012)  . Anemia     "slightly" (08/04/2012)  .  Arthritis     "probably minor; knees, pinky" (08/04/2012)  . Fracture of L4 vertebra ([place])     "dx'd in 2013; can't treat til legs fixed" (08/04/2012)  . Lower extremity ulceration ([place])   . Biventricular heart failure with reduced left ventricular function ([place])    Social History   Social History  . Marital  Status: Married    Spouse Name: N/A  . Number of Children: 2  . Years of Education: 13   Occupational History  . retired    Social History Main Topics  . Smoking status: Never Smoker   . Smokeless tobacco: Never Used  . Alcohol Use: 0.0 oz/week     Comment: 08/04/2012 "used to have 1-2 glasses of wine/wk; nothing for the past 1 yr"  . Drug Use: No  . Sexual Activity: No   Other Topics Concern  . None   Social History Narrative   HSG. 1 year of college. Married: 1959: 2 sons - '63, '70: 3 grandchildren. Work - Hydrographic surveyor. Have been living with husband in [place]. ACP/Living will - HCPOA both sons. Recommended going to theconversation [email or web address]                   Family History  Problem Relation Age of Onset  . Arthritis Mother   . Cancer Mother     breast cancer   . Cancer Brother     lung cancer  . Tuberculosis Father   . Cancer Sister     stg 4 breast cancer  . Diabetes Neg Hx   . Heart disease Neg Hx   . COPD Neg Hx    Scheduled Meds: . collagenase   Topical Daily  . hydrocortisone sod succinate (SOLU-CORTEF) inj  100 mg Intravenous Q6H  . levothyroxine  37.5 mcg Oral QAC breakfast  . nystatin  5 mL Oral QID  . oxyCODONE  10 mg Oral Q12H  . piperacillin-tazobactam (ZOSYN)  IV  2.25 g Intravenous Q8H  . sodium chloride  500 mL Intravenous Once   Continuous Infusions: . sodium chloride 100 mL/hr at 07/23/15 1844   PRN Meds:.acetaminophen **OR** acetaminophen, HYDROcodone-acetaminophen, magnesium citrate, morphine injection, ondansetron **OR** ondansetron (ZOFRAN) IV, polyethylene glycol, traZODone Medications Prior to Admission:  Prior to Admission medications   Medication Sig Start Date End Date Taking? Authorizing Provider  Amino Acids-Protein Hydrolys (FEEDING SUPPLEMENT, PRO-STAT SUGAR FREE 64,) LIQD Take 30 mLs by mouth 2 (two) times daily.   Yes Historical Provider, MD  aspirin EC 81 MG EC tablet Take 1 tablet (81 mg total) by mouth  daily. 05/13/15  Yes Maryann Mikhail, DO  docusate sodium (COLACE) 100 MG capsule Take 100 mg by mouth 2 (two) times daily.   Yes Historical Provider, MD  doxycycline (VIBRA-TABS) 100 MG tablet Take 1 tablet (100 mg total) by mouth every 12 (twelve) hours. 07/07/15  Yes Florencia Reasons, MD  feeding supplement, ENSURE ENLIVE, (ENSURE ENLIVE) LIQD Take 237 mLs by mouth 2 (two) times daily between meals. 06/20/15  Yes Oswald Hillock, MD  levothyroxine (SYNTHROID, LEVOTHROID) 25 MCG tablet TAKE 1 AND 1/2 TABS (37.5MG) BY MOUTH EVERY DAY*CHECK PULSE WEEKLY* 07/14/12  Yes Neena Rhymes, MD  metoprolol succinate (TOPROL-XL) 25 MG 24 hr tablet Take 0.5 tablets (12.5 mg total) by mouth daily. 11/01/12  Yes Neena Rhymes, MD  Multiple Vitamin (MULTIVITAMIN WITH MINERALS) TABS Take 1 tablet by mouth every morning.   Yes Historical Provider, MD  oxyCODONE (OXY IR/ROXICODONE) 5 MG immediate release  tablet Take 5 mg by mouth every 6 (six) hours as needed for severe pain.   Yes Historical Provider, MD  oxyCODONE (OXYCONTIN) 10 mg 12 hr tablet Take 10 mg by mouth every 12 (twelve) hours.   Yes Historical Provider, MD  torsemide (DEMADEX) 20 MG tablet Take 1 tablet (20 mg total) by mouth daily. Reported on 06/10/2015 07/09/15  Yes Florencia Reasons, MD  warfarin (COUMADIN) 2 MG tablet Take 2 mg by mouth daily.   Yes Historical Provider, MD  Amino Acids-Protein Hydrolys (FEEDING SUPPLEMENT, PRO-STAT SUGAR FREE 64,) LIQD Take 30 mLs by mouth 2 (two) times daily.    Historical Provider, MD  collagenase (SANTYL) ointment Apply topically daily. Patient not taking: Reported on 07/22/2015 07/07/15   Florencia Reasons, MD  docusate sodium (COLACE) 100 MG capsule Take 100 mg by mouth daily.    Historical Provider, MD  oxyCODONE (OXY IR/ROXICODONE) 5 MG immediate release tablet Take 1 tablet (5 mg total) by mouth every 6 (six) hours as needed for moderate pain or severe pain. Patient not taking: Reported on 07/22/2015 07/07/15   Florencia Reasons, MD  warfarin (COUMADIN)  2 MG tablet Take 1 tablet (2 mg total) by mouth daily at 6 PM. Patient not taking: Reported on 07/22/2015 07/07/15   Florencia Reasons, MD   Allergies  Allergen Reactions  . Bactrim Ds [Sulfamethoxazole-Trimethoprim] Rash  . Contrast Media [Iodinated Diagnostic Agents] Hives    BLE erythema   . Ciprofloxacin Rash  . Tape Rash    Review of Systems  Complains of continuous pain in lower extremities. Denies other pain, diarrhea, constipation, dysuria, chest pain, sob  Physical Exam Cachectic frail female, appears chronically ill.  A&Ox3 CV irreg irreg Resp:  No increased work of breathing. Extremities:  LE wrapped in gauze.  Weeping has soaked thru.  Patient winces in pain at even the slightest touch to her legs.  Vital Signs: BP 99/66 mmHg  Pulse 61  Temp(Src) 97.4 F (36.3 C) (Oral)  Resp 12  Ht _0  (1.6 m)  Wt 55.747 kg (122 lb 14.4 oz)  BMI 21.78 kg/m2  SpO2 100%  SpO2: SpO2: 100 % O2 Device:SpO2: 100 % O2 Flow Rate: .   IO: Intake/output summary:  Intake/Output Summary (Last 24 hours) at 07/24/15 1107 Last data filed at 07/24/15 0900  Gross per 24 hour  Intake   1930 ml  Output    400 ml  Net   1530 ml    LBM: Last BM Date: 07/23/15 Baseline Weight: Weight: 43.092 kg (95 lb) Most recent weight: Weight: 55.747 kg (122 lb 14.4 oz)      Palliative Assessment/Data:    Additional Data Reviewed:  CBC:    Component Value Date/Time   WBC 9.3 07/24/2015 0530   HGB 8.9* 07/24/2015 0530   HCT 27.1* 07/24/2015 0530   PLT 151 07/24/2015 0530   MCV 87.4 07/24/2015 0530   NEUTROABS 10.6* 07/22/2015 1928   LYMPHSABS 1.0 07/22/2015 1928   MONOABS 0.7 07/22/2015 1928   EOSABS 0.0 07/22/2015 1928   BASOSABS 0.0 07/22/2015 1928   Comprehensive Metabolic Panel:    Component Value Date/Time   NA 146* 07/24/2015 0530   K 4.0 07/24/2015 0530   CL 116* 07/24/2015 0530   CO2 19* 07/24/2015 0530   BUN 98* 07/24/2015 0530   CREATININE 3.20* 07/24/2015 0530   GLUCOSE 140*  07/24/2015 0530   CALCIUM 8.3* 07/24/2015 0530   AST 43* 07/24/2015 0530   ALT 25 07/24/2015 0530  ALKPHOS 217* 07/24/2015 0530   BILITOT 1.3* 07/24/2015 0530   PROT 5.4* 07/24/2015 0530   ALBUMIN 1.8* 07/24/2015 0530     Time In: 8:45 Time Out: 10:00 Time Total: 70 mon. Greater than 50%  of this time was spent counseling and coordinating care related to the above assessment and plan.  Signed by:  Imogene Burn, PA-C Palliative Medicine Pager: [phone]  07/24/2015, 11:07 AM  Please contact Palliative Medicine Team phone at [phone] for questions and concerns.

## 2015-07-24 NOTE — Progress Notes (Signed)
CRITICAL VALUE ALERT  Critical value received:  Gram positive rods in aerobic bottle  Date of notification:  07/24/2015  Time of notification:  0210  Critical value read back:Yes.    Nurse who received alert:  Kristeen Misshris Alfons Sulkowski RN  MD notified (1st page):  Merdis DelayK. Schorr, NP  Time of first page:  0212  MD notified (2nd page):  Time of second page:  Responding MD:  Merdis DelayK. Schorr, NP  Time MD responded:  [phone]

## 2015-07-24 NOTE — Clinical Social Work Note (Signed)
Clinical Social Work Assessment  Patient Details  Name: Kristina Diaz MRN: [id number] Date of Birth: 11/30/1929  Date of referral:  07/24/15               Reason for consult:  Facility Placement, End of Life/Hospice                Permission sought to share information with:  Family Supports Permission granted to share information::  Yes, Verbal Permission Granted  Name::     Kristina Diaz  Agency::  [facility]  Relationship::  son  Contact Information:     Housing/Transportation Living arrangements for the past 2 months:  Assisted Living Facility Source of Information:  Adult Children Patient Interpreter Needed:  None Criminal Activity/Legal Involvement Pertinent to Current Situation/Hospitalization:  No - Comment as needed Significant Relationships:  Adult Children Lives with:  Facility Resident, Spouse Do you feel safe going back to the place where you live?  Yes Need for family participation in patient care:  Yes (Comment) (decision making)  Care giving concerns:  Pt has been living at ALF with home health taking care of wound, ALF does not think it is appropriate for the pt to come back to ALF level with her current wound care needs and do not feel comfortable taking pt back.   Social Worker assessment / plan:  CSW informed by palliative medicine that pt is requiring hospice care with home health at ALF or would likely be residential hospice eligible once completely comfort care.  CSW spoke with pt son and explained inability for ALF to take pt back.  Son expressed understanding but sad that pt can't [facility] her spouse lives.  CSW discussed other option for residential hospice and son expressed strong [facility].  Employment status:  Retired Health and safety inspectornsurance information:  Medicare PT Recommendations:  Not assessed at this time Information / Referral to community resources:   (residential hospice)  Patient/Family's Response to care: Pt  son is agreeable to palliative recommendation for residential hospice but wishes pt would be able to return home (to ALF) to spend more time with her husband.  Patient/Family's Understanding of and Emotional Response to Diagnosis, Current Treatment, and Prognosis:  Pt son has good understanding of pt condition and seems to be coping with her current condition well.  Pt has had multiple admissions recently and son has known she was declining.  Emotional Assessment Appearance:  Appears stated age Attitude/Demeanor/Rapport:    Affect (typically observed):  Appropriate Orientation:  Oriented to Self, Oriented to Place, Oriented to  Time, Oriented to Situation Alcohol / Substance use:  Not Applicable Psych involvement (Current and /or in the community):  No (Comment)  Discharge Needs  Concerns to be addressed:  Care Coordination Readmission within the last 30 days:  Yes Current discharge risk:  Physical Impairment, None Barriers to Discharge:  Continued Medical Work up, Hospice Bed not available   Kristina RibasHoloman, Kristina Justiniano M, LCSW 07/24/2015, 3:55 PM

## 2015-07-24 NOTE — Progress Notes (Signed)
Dressing changed to BLE, pain med administered prior to dressing changed.  Santyl applied to open wounds covered with gauze impregnated with petrolatum.  Patient continuously moans during this treatment.

## 2015-07-24 NOTE — Progress Notes (Signed)
Chaplain stopped by to visit with Kristina Diaz. Chaplain provided emotional Support via prayer and sat with Kristina Diaz. Chaplain will continue to provided emotional and spiritual care.    Thanks, Corwin LevinsBeatrice Burnie Hank

## 2015-07-24 NOTE — Clinical Documentation Improvement (Signed)
Internal Medicine  Can the diagnosis of Encephalopathy be further specified? Please document your response in next progress note. Thank you!   Metabolic Encephalopathy secondary to Sepsis and ARF  Toxic Encephalopathy  Other Type of Encephalopathy  Clinically Undetermined  Supporting Information:  Please exercise your independent, professional judgment when responding. A specific answer is not anticipated or expected.  Thank You, Shellee MiloEileen T Gaylord Seydel RN, BSN, CCDS Health Information Management [place] [phone]; Cell: [phone]

## 2015-07-25 DIAGNOSIS — M79605 Pain in left leg: Secondary | ICD-10-CM

## 2015-07-25 DIAGNOSIS — Z515 Encounter for palliative care: Secondary | ICD-10-CM | POA: Diagnosis present

## 2015-07-25 DIAGNOSIS — R64 Cachexia: Secondary | ICD-10-CM | POA: Diagnosis present

## 2015-07-25 DIAGNOSIS — M79604 Pain in right leg: Secondary | ICD-10-CM | POA: Diagnosis present

## 2015-07-25 MED ORDER — LORAZEPAM 0.5 MG PO TABS
0.5000 mg | ORAL_TABLET | Freq: Two times a day (BID) | ORAL | Status: DC
  Administered 2015-07-25 – 2015-07-26 (×3): 0.5 mg via ORAL
  Filled 2015-07-25 (×3): qty 1

## 2015-07-25 MED ORDER — HYDROCODONE-ACETAMINOPHEN 5-325 MG PO TABS
2.0000 | ORAL_TABLET | ORAL | Status: DC | PRN
  Administered 2015-07-25: 2 via ORAL
  Filled 2015-07-25: qty 2

## 2015-07-25 MED ORDER — ACETAMINOPHEN 500 MG PO TABS: 1000.0000 mg | ORAL_TABLET | Freq: Three times a day (TID) | ORAL | Status: DC

## 2015-07-25 MED ORDER — ACETAMINOPHEN 325 MG PO TABS
650.0000 mg | ORAL_TABLET | Freq: Three times a day (TID) | ORAL | Status: DC
  Administered 2015-07-25 (×2): 650 mg via ORAL
  Filled 2015-07-25 (×4): qty 2

## 2015-07-25 MED ORDER — GABAPENTIN 100 MG PO CAPS
100.0000 mg | ORAL_CAPSULE | Freq: Two times a day (BID) | ORAL | Status: DC
  Administered 2015-07-25 – 2015-07-26 (×3): 100 mg via ORAL
  Filled 2015-07-25 (×3): qty 1

## 2015-07-25 NOTE — Progress Notes (Signed)
[facility] still full  CSW discussed other hospice options with pt son, [name].  Other options would be far away for pt son and husband- [facility] and other facilities are 30 minutes or more away.  Pt son also states [facility] and they have emotional attachment to that facility.  CSW will continue to follow  Merlyn LotJenna Holoman, Phoenixville HospitalCSWA Clinical Social Worker [phone]

## 2015-07-25 NOTE — Progress Notes (Signed)
[facility] TEAM 1 - Stepdown/ICU TEAM Progress Note  Rigoberto Noelancy Lehigh OZH:[id number] DOB: 12/01/1929 DOA: 07/22/2015 PCP: [name],PETER FRANK, MD  Admit HPI / Brief Narrative: 82 y.o. WF PMHx Atrial fibrillation on Coumadin, Chronic Systolic CHF, EF=45-50%, PAD, HTN, Hypothyroidism, fracture of L4, LLE  DVT, Chronic Left leg wound, with recurrent hospitalization for cellulitis on 06/26/2015 and 2/12 2017,   Brought from Nursing Home with AMS, persistent LLE redness and drainage.  At the ED, BP 92/50 mmHg  Pulse 108  Temp(Src) 97.5 F (36.4 C) (Oral)  Resp 10  Ht 5\' 3"  (1.6 m)  Wt 43.092 kg (95 lb)  BMI 16.83 kg/m2  SpO2 100% WBC 20.6. Lactic acid 2.57 EKG  She received Ceftin IV with mild improvement of her mental status. Questioning is limited, she falls asleep frequently. NO apparent dysuria, frequency, chest pain, worsening shortness of breath, back pain, fevers, cough, headache, neck stiffness.   HPI/Subjective: 3/30 A/O 4, very tearful stating that she wants to go home when I ask what home she points to the sky. States she is tired of being in pain and wants everything to be over. States her pain in her legs is both throbbing as well as feeling of pins and needles. Son also explains that mother becomes very anxious.   Assessment/Plan: Septic shock due to LLE cellulitis The pt has decided to transition to comfort focused care at this time - [facility]  -Comfort care  Bilateral Lower extremity cellulitis / chronic wound infections S/p L pop and tibial revascularization Dr. Loreta AveWagner 05/09/15 - this is her third hospitalization for similar issue - these wounds are clearly only getting worse, and no longer responding to medical tx   Acute encephalopathy due to acute infection and sepsis - mental status has improved  Acute kidney injury due to sepsis/shock/poor organ perfusion   Anemia of chronic disease No acute bleeding issues at this time     Supratherapeutic INR on warfarin for history of atrial fibrillation - no overt bleeding at this time - d/c warfarin w/ transition to comfort focused care   Atrial Fibrillation HR presently reasonably controlled   Chronic Systolic and diastolic HF 2D Echo 06/12/15 > EF 35% to 40% w/ diffuse hypokinesis.  Hypothyroidism  Anxiety -Ativan 0.5 mg BID  Chronic pain syndrome -Continue current narcotic regimen. -Gabapentin 100 mg BID    Code Status: FULL Family Communication: Husband and son present at time of exam Disposition Plan: -Comfort care    Consultants: Palliative Care  Procedure/Significant Events:    Culture   Antibiotics: Zosyn 3/20>>  DVT prophylaxis:    Devices    LINES / TUBES:      Continuous Infusions: . sodium chloride 10 mL/hr at 07/24/15 1707    Objective: VITAL SIGNS: Temp: 97.4 F (36.3 C) (03/30 1400) Temp Source: Oral (03/30 1400) BP: 106/83 mmHg (03/30 1400) Pulse Rate: 123 (03/30 1400) SPO2; FIO2:   Intake/Output Summary (Last 24 hours) at 07/25/15 1412 Last data filed at 07/25/15 1300  Gross per 24 hour  Intake 793.83 ml  Output   1000 ml  Net -206.17 ml     Exam: General: A/O 4, very tearful stating that she wants to go home when I ask what home she points to the sky.No acute respiratory distress Eyes: Negative headache, negative scleral hemorrhage ENT: Negative Runny nose, negative gingival bleeding, Neck:  Negative scars, masses, torticollis, lymphadenopathy, JVD Lungs: Clear to auscultation bilaterally without wheezes or  crackles Cardiovascular: Regular rate and rhythm without murmur gallop or rub normal S1 and S2 Abdomen:negative abdominal pain, nondistended, positive soft, bowel sounds, no rebound, no ascites, no appreciable mass Extremities: No significant cyanosis, clubbing, or edema bilateral lower extremities Psychiatric:  Positive depression, positive anxiety, negative fatigue, negative mania   Neurologic:  Cranial nerves II through XII intact, tongue/uvula midline, all extremities muscle strength 5/5, sensation intact throughout,  negative dysarthria, negative expressive aphasia, negative receptive aphasia.   Data Reviewed: Basic Metabolic Panel:  Recent Labs Lab 07/22/15 1328 07/22/15 1928 07/23/15 0333 07/24/15 0530  NA 143 144 143 146*  K 5.3* 3.6 5.0 4.0  CL 107 119* 112* 116*  CO2 19* 15* 18* 19*  GLUCOSE 69 60* 73 140*  BUN 106* 80* 102* 98*  CREATININE 4.26* 3.01* 3.86* 3.20*  CALCIUM 8.9 6.2* 8.5* 8.3*   Liver Function Tests:  Recent Labs Lab 07/22/15 1328 07/22/15 1928 07/23/15 0333 07/24/15 0530  AST 51* 35 47* 43*  ALT ALKPHOS 273* 184* 256* 217*  BILITOT 1.1 1.0 0.8 1.3*  PROT 6.8 4.4* 5.9* 5.4*  ALBUMIN 2.4* 1.5* 2.0* 1.8*   No results for input(s): LIPASE, AMYLASE in the last 168 hours. No results for input(s): AMMONIA in the last 168 hours. CBC:  Recent Labs Lab 07/22/15 1328 07/22/15 1928 07/23/15 0333 07/24/15 0530  WBC 20.6* 12.4* 15.1* 9.3  NEUTROABS 18.0* 10.6*  --   --   HGB 9.9* 7.6* 9.1* 8.9*  HCT 29.8* 23.0* 28.4* 27.1*  MCV 89.0 88.1 88.2 87.4  PLT 225 157 168 151   Cardiac Enzymes: No results for input(s): CKTOTAL, CKMB, CKMBINDEX, TROPONINI in the last 168 hours. BNP (last 3 results) No results for input(s): BNP in the last 8760 hours.  ProBNP (last 3 results) No results for input(s): PROBNP in the last 8760 hours.  CBG: No results for input(s): GLUCAP in the last 168 hours.  Recent Results (from the past 240 hour(s))  Blood Culture (routine x 2)     Status: None (Preliminary result)   Collection Time: 07/22/15  3:40 PM  Result Value Ref Range Status   Specimen Description BLOOD LEFT ARM  Final   Special Requests IN PEDIATRIC BOTTLE 1CC  Final   Culture  Setup Time   Final    GRAM POSITIVE RODS AEROBIC BOTTLE ONLY CRITICAL RESULT CALLED TO, READ BACK BY AND VERIFIED WITH: C WOODARD,RN   07/24/15 MKELLY    Culture   Final    GRAM POSITIVE RODS CULTURE REINCUBATED FOR BETTER GROWTH    Report Status PENDING  Incomplete  Blood Culture (routine x 2)     Status: None (Preliminary result)   Collection Time: 07/22/15  4:15 PM  Result Value Ref Range Status   Specimen Description BLOOD LEFT HAND  Final   Special Requests IN PEDIATRIC BOTTLE 1CC  Final   Culture NO GROWTH 2 DAYS  Final   Report Status PENDING  Incomplete  Urine culture     Status: None   Collection Time: 07/23/15  5:28 AM  Result Value Ref Range Status   Specimen Description URINE, CLEAN CATCH  Final   Special Requests NONE  Final   Culture >=100,000 COLONIES/mL YEAST  Final   Report Status 07/24/2015 FINAL  Final  MRSA PCR Screening     Status: None   Collection Time: 07/23/15  6:41 PM  Result Value Ref Range Status   MRSA by PCR NEGATIVE NEGATIVE Final  Comment:        The GeneXpert MRSA Assay (FDA approved for NASAL specimens only), is one component of a comprehensive MRSA colonization surveillance program. It is not intended to diagnose MRSA infection nor to guide or monitor treatment for MRSA infections.      Studies:  Recent x-ray studies have been reviewed in detail by the Attending Physician  Scheduled Meds:  Scheduled Meds: . acetaminophen  650 mg Oral Q8H  . nystatin  5 mL Oral QID  . oxyCODONE  10 mg Oral Q12H  . piperacillin-tazobactam (ZOSYN)  IV  2.25 g Intravenous Q8H    Time spent on care of this patient: 40 mins   Ashtynn Berke, Roselind Messier , MD  [facility] Office  [phone] Pager - [phone]  On-Call/Text Page:      [email or web address]      password [email or web address]  If 7PM-7AM, please contact night-coverage [email or web address] Password [email or web address] 07/25/2015, 2:12 PM   LOS: 3 days   Care during the described time interval was provided by me .  I have reviewed this patient's available data, including medical history, events of note, physical examination, and all test results as part  of my evaluation. I have personally reviewed and interpreted all radiology studies.   Carolyne Littles, MD [phone] Pager

## 2015-07-25 NOTE — Progress Notes (Addendum)
Daily Progress Note   Patient Name: Kristina Diaz       Date: 07/25/2015 DOB: 1929/09/24  Age: 82 y.o. MRN#: [id number] Attending Physician: Drema Dallas, MD Primary Care Physician: [name], MD Admit Date: 07/22/2015  Reason for Consultation/Follow-up: Establishing goals of care 82 yo female with severe non healing bilateral lower extremity wounds, A fib, biventricular heart failure, and cachexia presented to [facility] 3/27 with recurrent sepsis due to wound infection. This is her fifth admission in 6 months. Osteomyelitis of her lower extremities is suspected. The patient was encephalopathic when she was admitted. This has resolved. Her blood pressure is being supported with solu cortef as she was so hypotensive. TRH MDs have been trying to strike a balance between supporting the patient's blood pressure and providing enough pain medication to relieve her symptoms.   Subjective: "I have constant pain.  Pain is no better on medication.  May I please have a diet 7 up."  Interval Events: I spoke with son Susy Frizzle who reports he was called by someone with Hospice yesterday afternoon and they are waiting on a bed [facility].  I discussed increasing pain medications for his mother, and my concern that this my hasten her passing.  Matt requested that we not risk dropping her BP at this point as he wants his brother to have a chance to visit on Friday.   Length of Stay: 3 days  Current Medications: Scheduled Meds:  . levothyroxine  37.5 mcg Oral QAC breakfast  . nystatin  5 mL Oral QID  . oxyCODONE  10 mg Oral Q12H  . piperacillin-tazobactam (ZOSYN)  IV  2.25 g Intravenous Q8H    Continuous Infusions: . sodium chloride 10 mL/hr at 07/24/15 1707    PRN Meds: acetaminophen  **OR** acetaminophen, HYDROcodone-acetaminophen, magnesium citrate, morphine injection, ondansetron **OR** ondansetron (ZOFRAN) IV, polyethylene glycol, traZODone   Physical Exam       Cachectic, frail, slumped over female.  Still awake and alert CV irreg irreg, ++ Systolic murmur Resp:  No increased work of breathing Abdomen:  Soft, non distended. Extremities:  Wrapped in gauze,  Draining evident on gauze.  Painful to patient to even move the covers.        Vital Signs: BP 87/50 mmHg  Pulse 112  Temp(Src) 96.3 F (35.7 C) (  Axillary)  Resp 21  Ht 5\' 3"  (1.6 m)  Wt 55.747 kg (122 lb 14.4 oz)  BMI 21.78 kg/m2  SpO2 100% SpO2: SpO2: 100 % O2 Device: O2 Device: Not Delivered O2 Flow Rate:    Intake/output summary:   Intake/Output Summary (Last 24 hours) at 07/25/15 0902 Last data filed at 07/25/15 0800  Gross per 24 hour  Intake 1493.83 ml  Output    700 ml  Net 793.83 ml   LBM: Last BM Date: 07/23/15 Baseline Weight: Weight: 43.092 kg (95 lb) Most recent weight: Weight: 55.747 kg (122 lb 14.4 oz)       Palliative Assessment/Data: Flowsheet Rows        Most Recent Value   Intake Tab    Clinical Assessment    Palliative Performance Scale Score  20%   Pain Max last 24 hours  8   Pain Min Last 24 hours  5   Psychosocial & Spiritual Assessment    Palliative Care Outcomes       Additional Data Reviewed: CBC    Component Value Date/Time   WBC 9.3 07/24/2015 0530   RBC 3.10* 07/24/2015 0530   HGB 8.9* 07/24/2015 0530   HCT 27.1* 07/24/2015 0530   PLT 151 07/24/2015 0530   MCV 87.4 07/24/2015 0530   MCH 28.7 07/24/2015 0530   MCHC 32.8 07/24/2015 0530   RDW 19.0* 07/24/2015 0530   LYMPHSABS 1.0 07/22/2015 1928   MONOABS 0.7 07/22/2015 1928   EOSABS 0.0 07/22/2015 1928   BASOSABS 0.0 07/22/2015 1928    CMP     Component Value Date/Time   NA 146* 07/24/2015 0530   K 4.0 07/24/2015 0530   CL 116* 07/24/2015 0530   CO2 19* 07/24/2015 0530   GLUCOSE 140*  07/24/2015 0530   BUN 98* 07/24/2015 0530   CREATININE 3.20* 07/24/2015 0530   CALCIUM 8.3* 07/24/2015 0530   PROT 5.4* 07/24/2015 0530   ALBUMIN 1.8* 07/24/2015 0530   AST 43* 07/24/2015 0530   ALT 25 07/24/2015 0530   ALKPHOS 217* 07/24/2015 0530   BILITOT 1.3* 07/24/2015 0530   GFRNONAA 12* 07/24/2015 0530   GFRAA 14* 07/24/2015 0530       Problem List:  Patient Active Problem List   Diagnosis Date Noted  . Goals of care, counseling/discussion   . Encounter for hospice care discussion   . Cellulitis   . DNR (do not resuscitate)   . Palliative care encounter   . Weakness generalized   . Cellulitis of left lower leg   . Fever   . Wound infection (HCC)   . Chronic systolic heart failure (HCC)   . Dilated cardiomyopathy (HCC)   . Supratherapeutic INR   . Acute renal failure (ARF) (HCC) 06/26/2015  . Protein-calorie malnutrition, severe 06/13/2015  . AKI (acute kidney injury) (HCC) 06/10/2015  . Syncope 05/10/2015  . Hypotension 05/10/2015  . Anemia, blood loss 05/10/2015  . Fall 05/10/2015  . Hematoma   . DVT (deep vein thrombosis) in pregnancy   . Critical lower limb ischemia   . Pressure ulcer 01/25/2015  . Blood poisoning (HCC)   . Atrial fibrillation with RVR (HCC)   . Chronic systolic CHF (congestive heart failure) (HCC)   . Cellulitis of left lower extremity   . Sepsis (HCC) 01/23/2015  . Leg ulcer, left (HCC)   . Encounter for therapeutic drug monitoring 05/31/2013  . Chronic combined systolic and diastolic heart failure (HCC) 12/10/2012  . Long term (current) use of  anticoagulants 08/22/2012  . Chronic diastolic heart failure 08/18/2012  . Wound of lower extremity 08/08/2012  . Acute on chronic diastolic heart failure (HCC) 08/07/2012  . Hypothyroidism 08/07/2012  . Right heart failure (HCC) 08/04/2012  . Edema 07/26/2012  . Atherosclerotic peripheral vascular disease with ulceration (HCC) 06/30/2012  . PAD (peripheral artery disease) (HCC)   .  Atrial fibrillation (HCC)   . Hypertension   . Thyroid disease      Palliative Care Assessment & Plan    1.Code Status: DNR   2. Goals of Care/Additional Recommendations:  Comfort  Measures only.  OK to move to 6N if OK with TRH Attending.  Desire for further Chaplaincy support: Yes, appreciate Chaplain support  Psycho-social Needs: Grief/Bereavement Support  3. Symptom Management:      Continue long acting Oxycontin, along with PRN norco and morphine.       Will add scheduled tylenol.  I don't think the patient is requesting PRNs.       Will request frequent assessment for pain.  4. Palliative Prophylaxis:   Aspiration, Bowel Regimen, Frequent Pain Assessment and Palliative Wound Care  5. Prognosis: < 2 weeks  6. Discharge Planning:  Hospice facility   Care plan was discussed with Hinda Glatter.  Thank you for allowing the Palliative Medicine Team to assist in the care of this patient.   Time In: 8:30 Time Out: 9:05 Total Time 35  no        Stephani Police, PA-C  07/25/2015, 9:02 AM  Please contact Palliative Medicine Team phone at [phone] for questions and concerns.

## 2015-07-25 NOTE — Care Management Important Message (Signed)
Important Message  Patient Details  Name: Kristina Diaz MRN: [id number] Date of Birth: 12/30/1929   Medicare Important Message Given:  Yes    [name] 07/25/2015, 2:07 PM

## 2015-07-26 DIAGNOSIS — M79604 Pain in right leg: Secondary | ICD-10-CM

## 2015-07-26 DIAGNOSIS — E079 Disorder of thyroid, unspecified: Secondary | ICD-10-CM

## 2015-07-26 DIAGNOSIS — M79605 Pain in left leg: Secondary | ICD-10-CM

## 2015-07-26 DIAGNOSIS — I5042 Chronic combined systolic (congestive) and diastolic (congestive) heart failure: Secondary | ICD-10-CM

## 2015-07-26 DIAGNOSIS — R64 Cachexia: Secondary | ICD-10-CM

## 2015-07-26 DIAGNOSIS — F411 Generalized anxiety disorder: Secondary | ICD-10-CM | POA: Diagnosis present

## 2015-07-26 DIAGNOSIS — Z515 Encounter for palliative care: Secondary | ICD-10-CM

## 2015-07-26 MED ORDER — ACETAMINOPHEN 325 MG PO TABS: 650.0000 mg | ORAL_TABLET | Freq: Four times a day (QID) | ORAL | Status: AC | PRN

## 2015-07-26 MED ORDER — ONDANSETRON HCL 4 MG PO TABS: 4.0000 mg | ORAL_TABLET | Freq: Four times a day (QID) | ORAL | Status: AC | PRN

## 2015-07-26 MED ORDER — ACETAMINOPHEN 325 MG PO TABS
650.0000 mg | ORAL_TABLET | Freq: Three times a day (TID) | ORAL | Status: AC
Start: 2015-07-26 — End: ?

## 2015-07-26 MED ORDER — OXYCODONE HCL 20 MG/ML PO CONC: 5.0000 mg | ORAL | Status: DC | PRN

## 2015-07-26 MED ORDER — POLYETHYLENE GLYCOL 3350 17 G PO PACK: 17.0000 g | PACK | Freq: Every day | ORAL | Status: AC | PRN

## 2015-07-26 MED ORDER — MAGNESIUM CITRATE PO SOLN: 1.0000 | Freq: Once | ORAL | Status: AC | PRN

## 2015-07-26 MED ORDER — GABAPENTIN 100 MG PO CAPS: 100.0000 mg | ORAL_CAPSULE | Freq: Two times a day (BID) | ORAL | Status: AC

## 2015-07-26 MED ORDER — LORAZEPAM 0.5 MG PO TABS: 0.5000 mg | ORAL_TABLET | Freq: Two times a day (BID) | ORAL | Status: AC

## 2015-07-26 MED ORDER — OXYCODONE HCL ER 10 MG PO T12A: 10.0000 mg | EXTENDED_RELEASE_TABLET | Freq: Two times a day (BID) | ORAL | Status: AC

## 2015-07-26 MED ORDER — MORPHINE SULFATE (PF) 2 MG/ML IV SOLN
2.0000 mg | INTRAVENOUS | Status: DC | PRN
  Administered 2015-07-26: 2 mg via INTRAVENOUS
  Filled 2015-07-26: qty 1

## 2015-07-26 MED ORDER — OXYCODONE HCL 20 MG/ML PO CONC: 6.0000 mg | ORAL | Status: AC | PRN

## 2015-07-26 MED ORDER — TRAZODONE HCL 50 MG PO TABS: 25.0000 mg | ORAL_TABLET | Freq: Every evening | ORAL | Status: AC | PRN

## 2015-07-26 NOTE — Progress Notes (Signed)
CRITICAL VALUE ALERT  Critical value received:  Gram positive cocci in aerobic Date of notification:  07/26/15  Time of notification:  10:45  Critical value read back:Yes.    Nurse who received alert:  Ladoris GeneNana Adekunle Rohrbach  MD notified (1st page):  Dr. Sharon SellerMcClung  Time of first page: 10:52  MD notified (2nd page):  Time of second page:  Responding MD:    Time MD responded:  No new orders

## 2015-07-26 NOTE — Discharge Summary (Signed)
DISCHARGE SUMMARY  Kristina Diaz  MR#: [id number]  DOB:06/21/1929  Date of Admission: 07/22/2015 Date of Discharge: 07/26/2015  Attending Physician:Diem Dicocco T  Patient's WUJ:[id number],AOZHYPCP:Kristina Diaz,Kristina Homero FellersFRANK, MD  Consults:  Palliative Care  Disposition: D/C to a hospice house for comfort focused care   Discharge Diagnoses: Septic shock due to LLE cellulitis Bilateral Lower extremity cellulitis / chronic wound infections Acute encephalopathy Acute kidney injury Anemia of chronic disease Supratherapeutic INR Atrial Fibrillation Chronic Systolic and diastolic HF Hypothyroidism  Initial presentation: 82 year old female who was recently hospitalized for cellulitis of her left lower extremity. She has chronic bilateral lower extremity wounds. This is her third hospitalization for similar reasons. She had an MRI done of the left leg on March 7 and no drainable abscess or osteomyelitis was noted at that time. She was re-admitted with worsening pain in the left leg and sepsis.  Hospital Course:  Septic shock due to LLE cellulitis The pt has decided to transition to comfort focused care at this time - [facility] - the pt is to be transferred there 07/26/15  Bilateral Lower extremity cellulitis / chronic wound infections S/p L pop and tibial revascularization Dr. Loreta AveWagner 05/09/15 - this is her third hospitalization for similar issue - these wounds are clearly only getting worse, and no longer responding to medical tx   Acute encephalopathy due to acute infection and sepsis - mental status has improved prior to d/c   Acute kidney injury due to sepsis/shock/poor organ perfusion   Anemia of chronic disease No acute bleeding issues during this hospital stay   Supratherapeutic INR on warfarin for history of atrial fibrillation - no overt bleeding at this time - d/c warfarin w/ transition to comfort focused care   Atrial Fibrillation HR presently  reasonably controlled   Chronic Systolic and diastolic HF 2D Echo 06/12/15 > EF 35% to 40% w/ diffuse hypokinesis.  Hypothyroidism    Medication List    STOP taking these medications        aspirin 81 MG EC tablet     collagenase ointment  Commonly known as:  SANTYL     docusate sodium 100 MG capsule  Commonly known as:  COLACE     doxycycline 100 MG tablet  Commonly known as:  VIBRA-TABS     feeding supplement (ENSURE ENLIVE) Liqd     feeding supplement (PRO-STAT SUGAR FREE 64) Liqd     levothyroxine 25 MCG tablet  Commonly known as:  SYNTHROID, LEVOTHROID     metoprolol succinate 25 MG 24 hr tablet  Commonly known as:  TOPROL-XL     multivitamin with minerals Tabs tablet     oxyCODONE 5 MG immediate release tablet  Commonly known as:  Oxy IR/ROXICODONE  Replaced by:  oxyCODONE 20 MG/ML concentrated solution  You also have another medication with the same name that you need to continue taking as instructed.     torsemide 20 MG tablet  Commonly known as:  DEMADEX     warfarin 2 MG tablet  Commonly known as:  COUMADIN      TAKE these medications        acetaminophen 325 MG tablet  Commonly known as:  TYLENOL  Take 2 tablets (650 mg total) by mouth every 6 (six) hours as needed for mild pain (or Fever >/= 101).     acetaminophen 325 MG tablet  Commonly known as:  TYLENOL  Take 2 tablets (650 mg total) by mouth every 8 (  eight) hours.     gabapentin 100 MG capsule  Commonly known as:  NEURONTIN  Take 1 capsule (100 mg total) by mouth 2 (two) times daily.     LORazepam 0.5 MG tablet  Commonly known as:  ATIVAN  Take 1 tablet (0.5 mg total) by mouth 2 (two) times daily.     magnesium citrate Soln  Take 296 mLs (1 Bottle total) by mouth once as needed for severe constipation.     ondansetron 4 MG tablet  Commonly known as:  ZOFRAN  Take 1 tablet (4 mg total) by mouth every 6 (six) hours as needed for nausea.     oxyCODONE 10 mg 12 hr tablet  Commonly  known as:  OXYCONTIN  Take 1 tablet (10 mg total) by mouth every 12 (twelve) hours.     oxyCODONE 20 MG/ML concentrated solution  Commonly known as:  ROXICODONE INTENSOL  Take 0.3-0.5 mLs (6-10 mg total) by mouth every hour as needed for severe pain.     polyethylene glycol packet  Commonly known as:  MIRALAX / GLYCOLAX  Take 17 g by mouth daily as needed for mild constipation.     traZODone 50 MG tablet  Commonly known as:  DESYREL  Take 0.5 tablets (25 mg total) by mouth at bedtime as needed for sleep.       Day of Discharge BP 99/54 mmHg  Pulse 143  Temp(Src) 97.4 F (36.3 C) (Oral)  Resp 18  Ht  (1.6 m)  Wt 55.747 kg (122 lb 14.4 oz)  BMI 21.78 kg/m2  SpO2 100%  Physical Exam: The pt is resting comfortably in bed.  She does not appear to be in any acute distress.  I spoke w/ her son at the bedside.    Basic Metabolic Panel:  Recent Labs Lab 07/22/15 1328 07/22/15 1928 07/23/15 0333 07/24/15 0530  NA 143 144 143 146*  K 5.3* 3.6 5.0 4.0  CL 107 119* 112* 116*  CO2 19* 15* 18* 19*  GLUCOSE 69 60* 73 140*  BUN 106* 80* 102* 98*  CREATININE 4.26* 3.01* 3.86* 3.20*  CALCIUM 8.9 6.2* 8.5* 8.3*    Liver Function Tests:  Recent Labs Lab 07/22/15 1328 07/22/15 1928 07/23/15 0333 07/24/15 0530  AST 51* 35 47* 43*  ALT ALKPHOS 273* 184* 256* 217*  BILITOT 1.1 1.0 0.8 1.3*  PROT 6.8 4.4* 5.9* 5.4*  ALBUMIN 2.4* 1.5* 2.0* 1.8*   Coags:  Recent Labs Lab 07/22/15 1606 07/22/15 1928 07/23/15 0333 07/23/15 1629 07/24/15 0530  INR 9.49* >10.00* >10.00* 3.64* 2.35*   CBC:  Recent Labs Lab 07/22/15 1328 07/22/15 1928 07/23/15 0333 07/24/15 0530  WBC 20.6* 12.4* 15.1* 9.3  NEUTROABS 18.0* 10.6*  --   --   HGB 9.9* 7.6* 9.1* 8.9*  HCT 29.8* 23.0* 28.4* 27.1*  MCV 89.0 88.1 88.2 87.4  PLT 225 157 168 151     Recent Results (from the past 240 hour(s))  Blood Culture (routine x 2)     Status: None (Preliminary result)    Collection Time: 07/22/15  3:40 PM  Result Value Ref Range Status   Specimen Description BLOOD LEFT ARM  Final   Special Requests IN PEDIATRIC BOTTLE 1CC  Final   Culture  Setup Time   Final    GRAM POSITIVE RODS AEROBIC BOTTLE ONLY CRITICAL RESULT CALLED TO, READ BACK BY AND VERIFIED WITH: C WOODARD,RN  07/24/15 MKELLY    Culture  Final    DIPHTHEROIDS(CORYNEBACTERIUM SPECIES) Standardized susceptibility testing for this organism is not available. GRAM POSITIVE COCCI CULTURE REINCUBATED FOR BETTER GROWTH CRITICAL RESULT CALLED TO, READ BACK BY AND VERIFIED WITH: N ADDAI,RN AT 1042 07/26/15 BY L BENFIELD    Report Status PENDING  Incomplete  Blood Culture (routine x 2)     Status: None (Preliminary result)   Collection Time: 07/22/15  4:15 PM  Result Value Ref Range Status   Specimen Description BLOOD LEFT HAND  Final   Special Requests IN PEDIATRIC BOTTLE 1CC  Final   Culture NO GROWTH 4 DAYS  Final   Report Status PENDING  Incomplete  Urine culture     Status: None   Collection Time: 07/23/15  5:28 AM  Result Value Ref Range Status   Specimen Description URINE, CLEAN CATCH  Final   Special Requests NONE  Final   Culture >=100,000 COLONIES/mL YEAST  Final   Report Status 07/24/2015 FINAL  Final  MRSA PCR Screening     Status: None   Collection Time: 07/23/15  6:41 PM  Result Value Ref Range Status   MRSA by PCR NEGATIVE NEGATIVE Final    Comment:        The GeneXpert MRSA Assay (FDA approved for NASAL specimens only), is one component of a comprehensive MRSA colonization surveillance program. It is not intended to diagnose MRSA infection nor to guide or monitor treatment for MRSA infections.      Time spent in discharge (includes decision making & examination of pt): 30 minutes  07/26/2015, 2:12 PM   Lonia Blood, MD [facility] Office  [phone] Pager [phone]  On-Call/Text Page:      [email or web address]      password [facility]

## 2015-07-26 NOTE — Progress Notes (Signed)
Pt being transported [facility]. Pt stable at time of transport.

## 2015-07-26 NOTE — Progress Notes (Signed)
Saw Kristina Diaz this am.  Per Susy FrizzleMatt (her son at bedside) her pain is improve with increased pain medications.  [facility] has had no beds available for several days.  We discussed [facility].  Susy FrizzleMatt is willing to [facility] if [facility] have availability.    Patient is quiet.  She has eaten a few bites of food.  She is slumped over to the left and currently non-verbal. She opens her eyes but does not respond to my questions.  BP is 87/63 pulse rate 123, respirations variable 17 - 30.  I called [facility] Social Work and let her know the family would like to consider [facility].  Algis DownsMarianne York, [place] JerseyPA-C Palliative Medicine Pager: [phone]

## 2015-07-26 NOTE — Progress Notes (Addendum)
10:30am CSW sent referral to [facility]. They will make appointment with patient's son.  [facility] CSW will continue to follow.  Kristina Diaz LCSWA [phone]

## 2015-07-26 NOTE — Progress Notes (Signed)
Patient will DC to: [facility] Anticipated DC date: 07/26/15 Family notified: Son Media plannerTransport by: PTAR 4pm  CSW signing off.  Cristobal GoldmannNadia Bocephus Cali, [place]CSWA Clinical Social Worker [phone]

## 2015-07-26 NOTE — Progress Notes (Signed)
Report called to receiving facility. RN at receiving facility instruct to leave pt's peripheral IV in place.

## 2015-07-26 NOTE — Discharge Instructions (Signed)
Hospice °Hospice is a service that is designed to provide people who are terminally ill and their families with medical, spiritual, and psychological support. Its aim is to improve your quality of life by keeping you as alert and comfortable as possible. Hospice is performed by a team of health care professionals and volunteers who: °· Help keep you comfortable. Hospice can be provided in your home or in a homelike setting. The hospice staff works with your family and friends to help meet your needs. You will enjoy the support of loved ones by receiving much of your basic care from family and friends. °· Provide pain relief and manage your symptoms. The staff supply all necessary medicines and equipment. °· Provide companionship when you are alone. °· Allow you and your family to rest. They may do light housekeeping, prepare meals, and run errands. °· Provide counseling. They will make sure your emotional, spiritual, and social needs and those of your family are being met. °· Provide spiritual care. Spiritual care is individualized to meet your needs and your family's needs. It may involve helping you look at what death means to you, say goodbye, or perform a specific religious ceremony or ritual. °Hospice teams often include: °· A nurse. °· A doctor. °· Social workers. °· Religious leaders (such as a chaplain). °· Trained volunteers. °WHEN SHOULD HOSPICE CARE BEGIN? °Most people who use hospice are believed to have fewer than 6 months to live. Your family and health care providers can help you decide when hospice services should begin. If your condition improves, you may discontinue the program. °WHAT SHOULD I CONSIDER BEFORE SELECTING A PROGRAM? °Most hospice programs are run by nonprofit, independent organizations. Some are affiliated with hospitals, nursing homes, or home health care agencies. Hospice programs can take place in the home or at a hospice center, hospital, or skilled nursing facility. When choosing  a hospice program, ask the following questions: °· What services are available to me? °· What services are offered to my loved ones? °· How involved are my loved ones? °· How involved is my health care provider? °· Who makes up the hospice care team? How are they trained or screened? °· How will my pain and symptoms be managed? °· If my circumstances change, can the services be provided in a different setting, such as my home or in the hospital? °· Is the program reviewed and licensed by the state or certified in some other way? °WHERE CAN I LEARN MORE ABOUT HOSPICE? °You can learn about existing hospice programs in your area from your health care providers. You can also read more about hospice online. The websites of the following organizations contain helpful information: °· The National Hospice and Palliative Care Organization (NHPCO). °· The Hospice Association of America (HAA). °· The Hospice Education Institute. °· The American Cancer Society (ACS). °· Hospice Net. °  °This information is not intended to replace advice given to you by your health care provider. Make sure you discuss any questions you have with your health care provider. °  °Document Released: 07/31/2003 Document Revised: 04/18/2013 Document Reviewed: 02/21/2013 °Elsevier Interactive Patient Education ©2016 Elsevier Inc. ° °

## 2015-07-26 NOTE — Progress Notes (Signed)
Patient has been approved by [facility].  Osborne Cascoadia Antario Yasuda LCSWA [phone]

## 2015-07-27 LAB — CULTURE, BLOOD (ROUTINE X 2): CULTURE: NO GROWTH

## 2015-08-15 ENCOUNTER — Telehealth: Payer: Self-pay | Admitting: *Deleted

## 2015-08-15 ENCOUNTER — Ambulatory Visit: Payer: Self-pay | Admitting: General Practice

## 2015-08-15 NOTE — Telephone Encounter (Signed)
FYI, Dr.K pt passed away April 4th per son [name] at [facility].

## 2015-08-15 NOTE — Telephone Encounter (Signed)
Called and spoke to pt's son Kristina Diaz, asked him how pt was? I am following up because our person who does coumadin orders realized she had not gotten any orders recently on pt and we [facility]. Kristina Diaz said pt passed away on April 4th and he thought we were notified. Told him no, but I will let Dr.K know. So Sorry for your loss. Kristina Diaz verbalized understanding.

## 2015-08-26 DEATH — deceased

## 2016-05-09 IMAGING — US IR [PERSON_NAME]/EXT/UNI*L*
1 series · 1 of 1 positions shown · non-contrast
Comparison: CT 04/25/2015

INDICATION: 85-year-old female with a history of chronic left lower extremity
wound.

Distribution of the wound suggests element of venous etiology.
Prior left popliteal artery occlusion has been treated, restoring
flow to the tibials.
She presents today for venography, possible intervention.
EXAM:
LEFT EXTREMITY VENOGRAPHY; IR ULTRASOUND GUIDANCE VASC ACCESS LEFT
TECHNIQUE: Informed written consent was obtained from the patient after a
thorough discussion of the procedural risks, benefits and
alternatives. All questions were addressed. Maximal Sterile Barrier
Technique was utilized including caps, mask, sterile gowns, sterile
gloves, sterile drape, hand hygiene and skin antiseptic. A timeout
was performed prior to the initiation of the procedure.

[Series 1: ir (id) (id)/(id)/(id) ir · 1 of 1 slices shown]
[im 1/1]
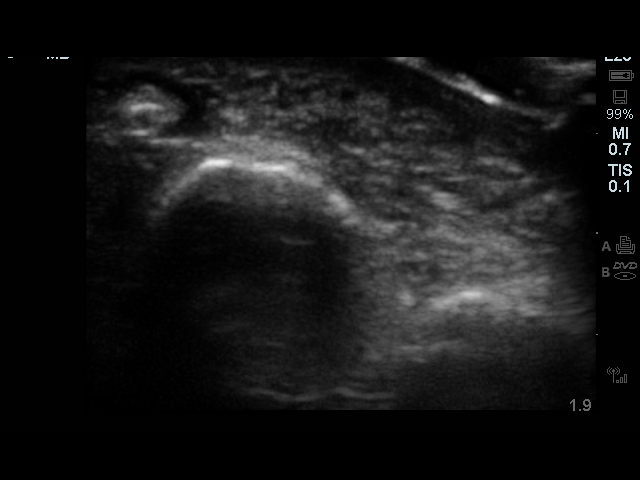

[1 of 1 positions shown; findings below may reference images not displayed]

MEDICATIONS:
Patient received pretreatment for a contrast allergy with
Solu-Medrol and Benadryl IV

ANESTHESIA/SEDATION:
Versed 0 mg IV; Fentanyl 25 mcg IV

Moderate Sedation Time:  25 minutes

The patient was continuously monitored during the procedure by the
interventional radiology nurse under my direct supervision.

FLUOROSCOPY TIME:  Fluoroscopy Time: 0 minutes 54 seconds (26 mGy).

COMPLICATIONS:
None
Ultrasound survey of the left pedal veins was performed with images
stored and sent to PACs.

Once the patient is prepped and draped in usual sterile fashion, the
skin was infiltrated with 1% lidocaine overlying the selected pedal
vein for access.

A 20 gauge angiocath was then inserted into the selected pedal vein
using ultrasound guidance.

With confirmed blood return, 1 cc contrast was infused to confirm
intravenous position.

CO2 venogram of the left lower extremity was performed first with no
tourniquet in place, and a second with a tourniquet in place at the
ankle.

Ultrasound survey of the proximal left thigh was performed with
images stored and sent to PACs.

A micropuncture needle was used access the proximal left femoral
vein under ultrasound. With venous blood flow returned, and an .018
micro wire was passed through the needle, observed enter the iliac
vein under fluoroscopy. The needle was removed, and a micropuncture
sheath was placed over the wire. The inner dilator and wire were
removed, and an 035 Bentson wire was advanced under fluoroscopy into
the pelvis. The sheath was removed and a standard 5 French vascular
sheath was placed. The dilator was removed and the sheath was
flushed.

CO2 venogram and then a contrast venogram of the left iliac system
was performed, with no occlusion or stenosis identified.

Both the pedal access and the femoral vein access were removed with
compression for hemostasis. Sterile bandages were placed.

Patient tolerated the procedure well and remained hemodynamically
stable throughout.

No complications were encountered and no significant blood loss.
FINDINGS: CO2 venogram of the left tibial venous system, both the superficial
and the deep (without a tourniquet and with a tourniquet in place)
demonstrate patency of the deep venous system, and continuous inline
through the popliteal vein and femoral vein.

Venogram of the left iliac system demonstrates no occlusion and no
significant stenosis.

No chronic occlusion was identified for treatment.
IMPRESSION: Status post left lower extremity venography, confirming patency of
the deep veins of the tibial system, and patency of the iliac
system. The femoral vein has been documented to be patent on prior
duplex ultrasound. No obstruction was found requiring treatment.

My impression is that the venous contribution to the left lower
extremity chronic wound is not from obstructed vein, though is
exacerbated by poor foot muscle pump, calf muscle pump, elevated
central venous pressures (cardiomegaly), and dependent positioning
of the wound.
